# Patient Record
Sex: Female | Born: 1985 | Race: White | Hispanic: No | Marital: Married | Smoking: Never smoker
Health system: Southern US, Community
[De-identification: ages and names within clinical notes are randomized; demographics above are authoritative.]

## PROBLEM LIST (undated history)

## (undated) DIAGNOSIS — T8859XA Other complications of anesthesia, initial encounter: Secondary | ICD-10-CM

## (undated) DIAGNOSIS — F329 Major depressive disorder, single episode, unspecified: Secondary | ICD-10-CM

## (undated) DIAGNOSIS — B977 Papillomavirus as the cause of diseases classified elsewhere: Secondary | ICD-10-CM

## (undated) DIAGNOSIS — M51369 Other intervertebral disc degeneration, lumbar region without mention of lumbar back pain or lower extremity pain: Secondary | ICD-10-CM

## (undated) DIAGNOSIS — F32A Depression, unspecified: Secondary | ICD-10-CM

## (undated) DIAGNOSIS — K219 Gastro-esophageal reflux disease without esophagitis: Secondary | ICD-10-CM

## (undated) DIAGNOSIS — M48061 Spinal stenosis, lumbar region without neurogenic claudication: Secondary | ICD-10-CM

## (undated) DIAGNOSIS — D649 Anemia, unspecified: Secondary | ICD-10-CM

## (undated) DIAGNOSIS — F988 Other specified behavioral and emotional disorders with onset usually occurring in childhood and adolescence: Secondary | ICD-10-CM

## (undated) DIAGNOSIS — C539 Malignant neoplasm of cervix uteri, unspecified: Secondary | ICD-10-CM

## (undated) DIAGNOSIS — M779 Enthesopathy, unspecified: Secondary | ICD-10-CM

## (undated) DIAGNOSIS — A6920 Lyme disease, unspecified: Secondary | ICD-10-CM

## (undated) DIAGNOSIS — N809 Endometriosis, unspecified: Secondary | ICD-10-CM

## (undated) DIAGNOSIS — H04123 Dry eye syndrome of bilateral lacrimal glands: Secondary | ICD-10-CM

## (undated) DIAGNOSIS — R7303 Prediabetes: Secondary | ICD-10-CM

## (undated) DIAGNOSIS — R630 Anorexia: Secondary | ICD-10-CM

## (undated) DIAGNOSIS — K3184 Gastroparesis: Secondary | ICD-10-CM

## (undated) DIAGNOSIS — E039 Hypothyroidism, unspecified: Secondary | ICD-10-CM

## (undated) DIAGNOSIS — Z9889 Other specified postprocedural states: Secondary | ICD-10-CM

## (undated) DIAGNOSIS — F39 Unspecified mood [affective] disorder: Secondary | ICD-10-CM

## (undated) DIAGNOSIS — E079 Disorder of thyroid, unspecified: Secondary | ICD-10-CM

## (undated) DIAGNOSIS — L309 Dermatitis, unspecified: Secondary | ICD-10-CM

## (undated) DIAGNOSIS — L300 Nummular dermatitis: Secondary | ICD-10-CM

## (undated) DIAGNOSIS — F419 Anxiety disorder, unspecified: Secondary | ICD-10-CM

## (undated) DIAGNOSIS — G894 Chronic pain syndrome: Secondary | ICD-10-CM

## (undated) DIAGNOSIS — F4310 Post-traumatic stress disorder, unspecified: Secondary | ICD-10-CM

## (undated) DIAGNOSIS — R112 Nausea with vomiting, unspecified: Secondary | ICD-10-CM

## (undated) HISTORY — DX: Anorexia: R63.0

## (undated) HISTORY — PX: GALLBLADDER SURGERY: SHX652

## (undated) HISTORY — DX: Depression, unspecified: F32.A

## (undated) HISTORY — DX: Anxiety disorder, unspecified: F41.9

## (undated) HISTORY — PX: ABDOMINAL HYSTERECTOMY: SHX81

## (undated) HISTORY — PX: OTHER SURGICAL HISTORY: SHX169

## (undated) HISTORY — PX: DIAGNOSTIC LAPAROSCOPY: SUR761

## (undated) HISTORY — PX: EYE SURGERY: SHX253

## (undated) HISTORY — PX: BACK SURGERY: SHX140

## (undated) HISTORY — DX: Major depressive disorder, single episode, unspecified: F32.9

## (undated) HISTORY — DX: Other specified behavioral and emotional disorders with onset usually occurring in childhood and adolescence: F98.8

## (undated) HISTORY — DX: Disorder of thyroid, unspecified: E07.9

## (undated) HISTORY — PX: CHOLECYSTECTOMY: SHX55

## (undated) HISTORY — DX: Papillomavirus as the cause of diseases classified elsewhere: B97.7

## (undated) HISTORY — DX: Dermatitis, unspecified: L30.9

## (undated) HISTORY — PX: HERNIA REPAIR: SHX51

---

## 2005-04-18 ENCOUNTER — Emergency Department (HOSPITAL_COMMUNITY): Admission: EM | Admit: 2005-04-18 | Discharge: 2005-04-18 | Payer: Self-pay | Admitting: Emergency Medicine

## 2005-04-19 ENCOUNTER — Emergency Department (HOSPITAL_COMMUNITY): Admission: EM | Admit: 2005-04-19 | Discharge: 2005-04-20 | Payer: Self-pay | Admitting: *Deleted

## 2005-04-20 ENCOUNTER — Inpatient Hospital Stay (HOSPITAL_COMMUNITY): Admission: EM | Admit: 2005-04-20 | Discharge: 2005-04-22 | Payer: Self-pay | Admitting: Psychiatry

## 2005-04-20 ENCOUNTER — Ambulatory Visit: Payer: Self-pay | Admitting: Psychiatry

## 2005-08-04 ENCOUNTER — Emergency Department (HOSPITAL_COMMUNITY): Admission: EM | Admit: 2005-08-04 | Discharge: 2005-08-05 | Payer: Self-pay | Admitting: Emergency Medicine

## 2005-09-08 ENCOUNTER — Emergency Department (HOSPITAL_COMMUNITY): Admission: EM | Admit: 2005-09-08 | Discharge: 2005-09-08 | Payer: Self-pay | Admitting: Emergency Medicine

## 2006-01-11 ENCOUNTER — Emergency Department (HOSPITAL_COMMUNITY): Admission: EM | Admit: 2006-01-11 | Discharge: 2006-01-12 | Payer: Self-pay | Admitting: Emergency Medicine

## 2007-01-11 ENCOUNTER — Emergency Department (HOSPITAL_COMMUNITY): Admission: EM | Admit: 2007-01-11 | Discharge: 2007-01-11 | Payer: Self-pay | Admitting: Emergency Medicine

## 2007-02-06 ENCOUNTER — Emergency Department (HOSPITAL_COMMUNITY): Admission: EM | Admit: 2007-02-06 | Discharge: 2007-02-06 | Payer: Self-pay | Admitting: Emergency Medicine

## 2007-02-09 ENCOUNTER — Inpatient Hospital Stay (HOSPITAL_COMMUNITY): Admission: AD | Admit: 2007-02-09 | Discharge: 2007-02-10 | Payer: Self-pay | Admitting: Obstetrics & Gynecology

## 2007-02-27 ENCOUNTER — Emergency Department (HOSPITAL_COMMUNITY): Admission: EM | Admit: 2007-02-27 | Discharge: 2007-02-28 | Payer: Self-pay | Admitting: Emergency Medicine

## 2010-04-30 ENCOUNTER — Emergency Department: Payer: Self-pay | Admitting: Emergency Medicine

## 2010-05-31 ENCOUNTER — Emergency Department: Payer: Self-pay | Admitting: Emergency Medicine

## 2010-06-14 ENCOUNTER — Encounter
Admission: RE | Admit: 2010-06-14 | Discharge: 2010-06-14 | Payer: Self-pay | Source: Home / Self Care | Attending: Family Medicine | Admitting: Family Medicine
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
## 2010-12-26 IMAGING — CT CT PARANASAL SINUSES LIMITED
1 series · 10 of 12 positions shown, 13 images · non-contrast
Comparison: None.

CLINICAL DATA: [DATE].  Sinus congestion.  Pressure in the frontal
and maxillary areas.

CT PARANASAL SINUS LIMITED WITHOUT CONTRAST
TECHNIQUE: Multidetector CT images of the paranasal sinuses were
obtained in a single plane without contrast.

[Series 3: coronal soft · axial · 0.33mm/px · z∈[+124,+214]mm · 10 of 12 slices shown, 13 images]
[im 2/12  brain]
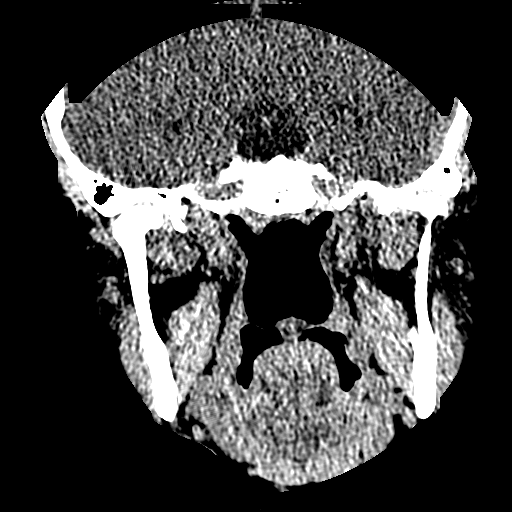
[im 2/12  bone]
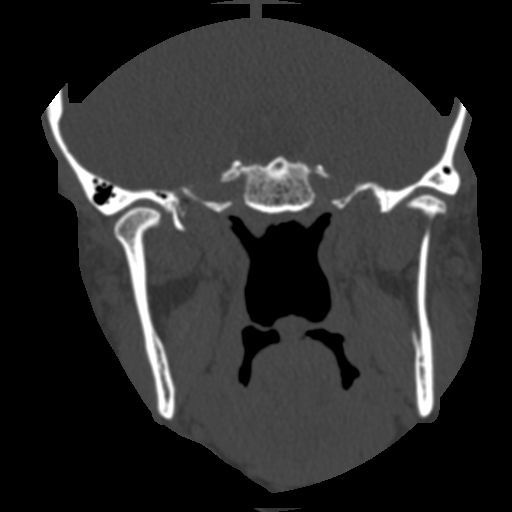
[im 3/12  bone]
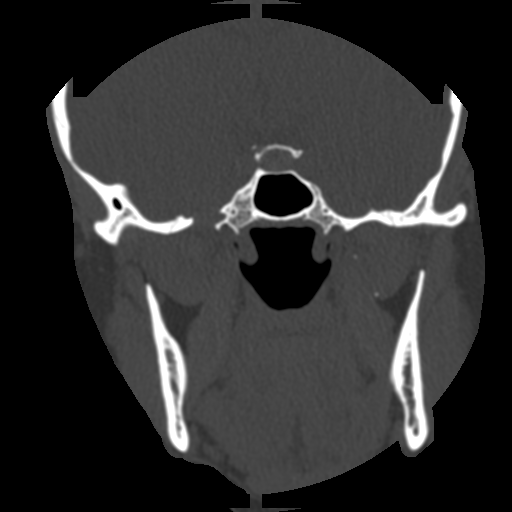
[im 4/12  bone]
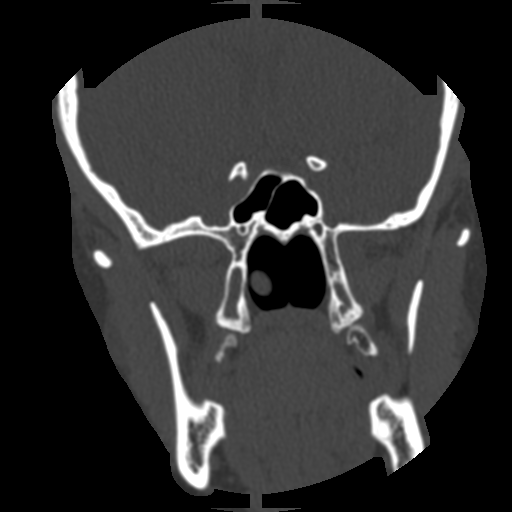
[im 5/12  bone]
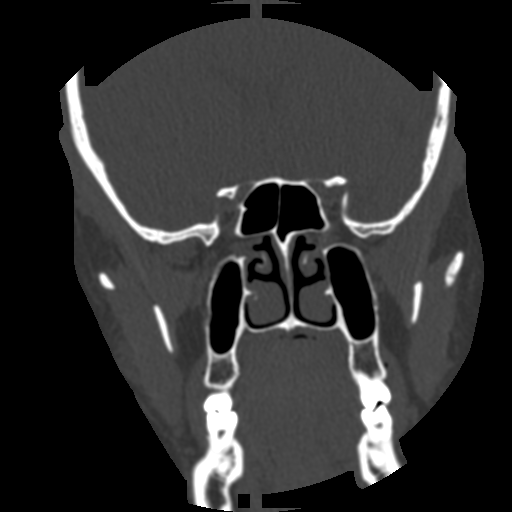
[im 6/12  brain]
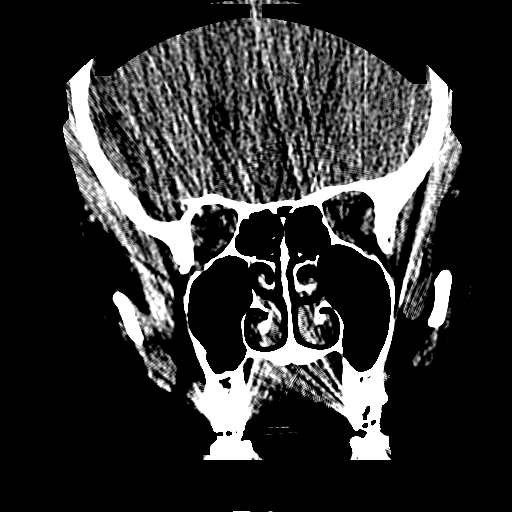
[im 6/12  bone]
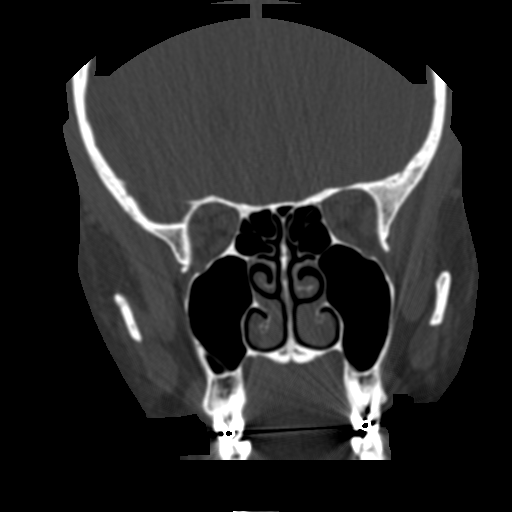
[im 7/12  bone]
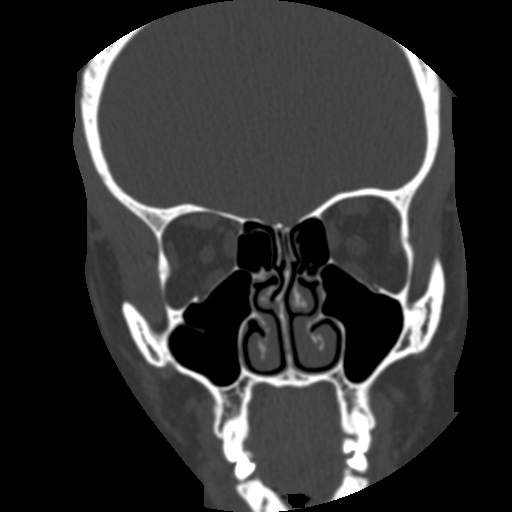
[im 8/12  bone]
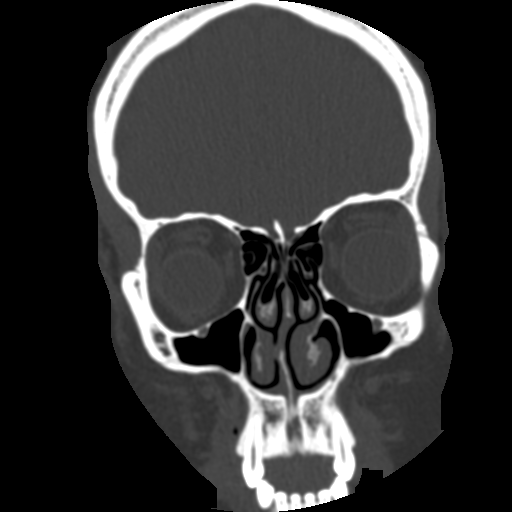
[im 9/12  bone]
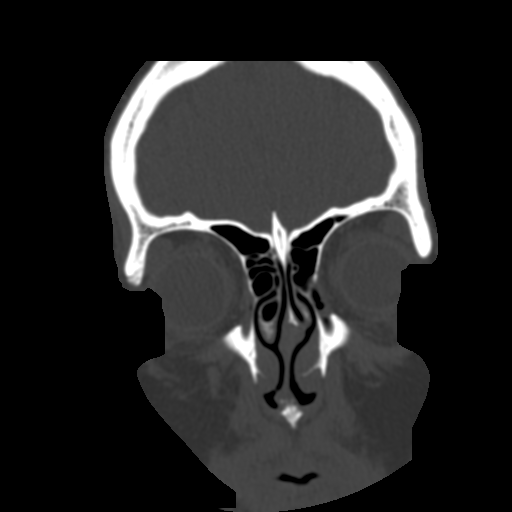
[im 10/12  brain]
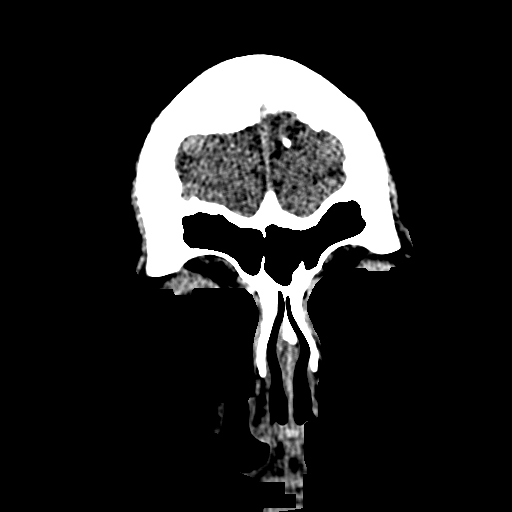
[im 10/12  bone]
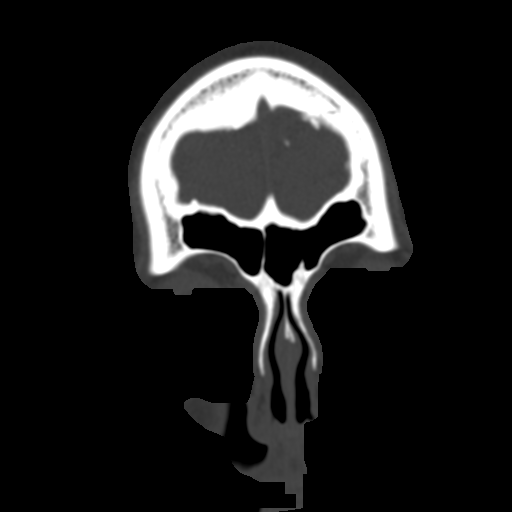
[im 11/12  bone]
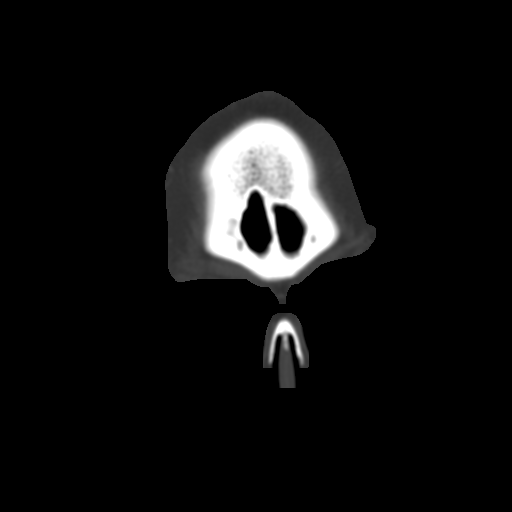

[10 of 12 positions shown; findings below may reference images not displayed]

FINDINGS: Screening examination of the paranasal sinuses
demonstrates no focal mucosal disease or fluid level.  Limited
imaging of the brain is unremarkable.  Limited imaging of the
orbits is within normal limits.
IMPRESSION: Negative screening CT of the paranasal sinuses.

## 2011-04-08 LAB — URINALYSIS, ROUTINE W REFLEX MICROSCOPIC
Bilirubin Urine: NEGATIVE
Bilirubin Urine: NEGATIVE
Glucose, UA: NEGATIVE
Glucose, UA: NEGATIVE
Hgb urine dipstick: NEGATIVE
Ketones, ur: NEGATIVE
Ketones, ur: NEGATIVE
Leukocytes, UA: NEGATIVE
Nitrite: NEGATIVE
Nitrite: NEGATIVE
Protein, ur: NEGATIVE
Protein, ur: NEGATIVE
Specific Gravity, Urine: 1.011
Specific Gravity, Urine: <1.005 — ABNORMAL LOW
Urobilinogen, UA: 0.2
Urobilinogen, UA: 0.2
pH: 5.5
pH: 6

## 2011-04-08 LAB — DIFFERENTIAL
Basophils Absolute: 0
Basophils Relative: 0
Eosinophils Absolute: 0.1
Eosinophils Relative: 1
Lymphocytes Relative: 16
Lymphs Abs: 1.4
Monocytes Absolute: 0.7
Monocytes Relative: 8
Neutro Abs: 6.5
Neutrophils Relative %: 75

## 2011-04-08 LAB — POCT I-STAT CREATININE
Creatinine, Ser: 1
Operator id: 272551

## 2011-04-08 LAB — I-STAT 8, (EC8 V) (CONVERTED LAB)
Acid-base deficit: 1
BUN: 10
Bicarbonate: 25.2 — ABNORMAL HIGH
Chloride: 102
Glucose, Bld: 92
HCT: 38
Hemoglobin: 12.9
Operator id: 272551
Potassium: 4.2
Sodium: 135
TCO2: 27
pCO2, Ven: 44.9 — ABNORMAL LOW
pH, Ven: 7.356 — ABNORMAL HIGH

## 2011-04-08 LAB — CBC
HCT: 36.3
Hemoglobin: 12.6
MCHC: 34.8
MCV: 89.6
Platelets: 262
RBC: 4.05
RDW: 11.9
WBC: 8.6

## 2011-04-08 LAB — URINE MICROSCOPIC-ADD ON

## 2011-04-08 LAB — POCT PREGNANCY, URINE
Operator id: 272551
Operator id: 118971
Preg Test, Ur: NEGATIVE
Preg Test, Ur: NEGATIVE

## 2011-04-11 LAB — I-STAT 8, (EC8 V) (CONVERTED LAB)
Acid-Base Excess: 1
Acid-Base Excess: 2
BUN: 9
BUN: 8
Bicarbonate: 28.3 — ABNORMAL HIGH
Bicarbonate: 28.2 — ABNORMAL HIGH
Chloride: 103
Chloride: 103
Glucose, Bld: 57 — ABNORMAL LOW
Glucose, Bld: 91
HCT: 39
HCT: 37
Hemoglobin: 13.3
Hemoglobin: 12.6
Operator id: 235561
Operator id: 239701
Potassium: 4.3
Potassium: 4.4
Sodium: 138
Sodium: 138
TCO2: 30
TCO2: 30
pCO2, Ven: 53.7 — ABNORMAL HIGH
pCO2, Ven: 47.2
pH, Ven: 7.33 — ABNORMAL HIGH
pH, Ven: 7.384 — ABNORMAL HIGH

## 2011-04-11 LAB — URINE CULTURE: Colony Count: 75000

## 2011-04-11 LAB — POCT URINALYSIS DIP (DEVICE)
Bilirubin Urine: NEGATIVE
Glucose, UA: NEGATIVE
Ketones, ur: NEGATIVE
Nitrite: NEGATIVE
Operator id: 235561
Protein, ur: NEGATIVE
Specific Gravity, Urine: 1.015
Urobilinogen, UA: 0.2
pH: 7

## 2011-04-11 LAB — GC/CHLAMYDIA PROBE AMP, GENITAL
Chlamydia, DNA Probe: NEGATIVE
GC Probe Amp, Genital: NEGATIVE

## 2011-04-11 LAB — POCT I-STAT CREATININE
Creatinine, Ser: 0.8
Creatinine, Ser: 0.9
Operator id: 235561
Operator id: 239701

## 2011-04-11 LAB — POCT PREGNANCY, URINE
Operator id: 235561
Operator id: 247071
Preg Test, Ur: NEGATIVE
Preg Test, Ur: NEGATIVE

## 2011-04-11 LAB — WET PREP, GENITAL
Trich, Wet Prep: NONE SEEN
Yeast Wet Prep HPF POC: NONE SEEN

## 2011-04-27 ENCOUNTER — Emergency Department: Payer: Self-pay | Admitting: Emergency Medicine

## 2011-08-31 ENCOUNTER — Emergency Department: Payer: Self-pay | Admitting: Unknown Physician Specialty

## 2011-08-31 LAB — COMPREHENSIVE METABOLIC PANEL
Albumin: 4 g/dL (ref 3.4–5.0)
Alkaline Phosphatase: 52 U/L (ref 50–136)
Anion Gap: 9 (ref 7–16)
BUN: 10 mg/dL (ref 7–18)
Bilirubin,Total: 0.3 mg/dL (ref 0.2–1.0)
Calcium, Total: 9.1 mg/dL (ref 8.5–10.1)
Chloride: 102 mmol/L (ref 98–107)
Co2: 27 mmol/L (ref 21–32)
Creatinine: 0.88 mg/dL (ref 0.60–1.30)
EGFR (African American): >60
EGFR (Non-African Amer.): >60
Glucose: 81 mg/dL (ref 65–99)
Osmolality: 274 (ref 275–301)
Potassium: 4.1 mmol/L (ref 3.5–5.1)
SGOT(AST): 25 U/L (ref 15–37)
SGPT (ALT): 24 U/L
Sodium: 138 mmol/L (ref 136–145)
Total Protein: 7.8 g/dL (ref 6.4–8.2)

## 2011-08-31 LAB — DRUG SCREEN, URINE
Amphetamines, Ur Screen: NEGATIVE (ref ?–1000)
Barbiturates, Ur Screen: NEGATIVE (ref ?–200)
Benzodiazepine, Ur Scrn: NEGATIVE (ref ?–200)
Cannabinoid 50 Ng, Ur ~~LOC~~: NEGATIVE (ref ?–50)
Cocaine Metabolite,Ur ~~LOC~~: NEGATIVE (ref ?–300)
MDMA (Ecstasy)Ur Screen: POSITIVE (ref ?–500)
Methadone, Ur Screen: NEGATIVE (ref ?–300)
Opiate, Ur Screen: NEGATIVE (ref ?–300)
Phencyclidine (PCP) Ur S: NEGATIVE (ref ?–25)
Tricyclic, Ur Screen: NEGATIVE (ref ?–1000)

## 2011-08-31 LAB — CBC
HCT: 37.7 % (ref 35.0–47.0)
HGB: 12.7 g/dL (ref 12.0–16.0)
MCH: 30.3 pg (ref 26.0–34.0)
MCHC: 33.7 g/dL (ref 32.0–36.0)
MCV: 90 fL (ref 80–100)
Platelet: 248 10*3/uL (ref 150–440)
RBC: 4.2 10*6/uL (ref 3.80–5.20)
RDW: 12.6 % (ref 11.5–14.5)
WBC: 6 10*3/uL (ref 3.6–11.0)

## 2011-08-31 LAB — URINALYSIS, COMPLETE
Bilirubin,UR: NEGATIVE
Blood: NEGATIVE
Glucose,UR: NEGATIVE mg/dL (ref 0–75)
Ketone: NEGATIVE
Leukocyte Esterase: NEGATIVE
Nitrite: NEGATIVE
Ph: 6 (ref 4.5–8.0)
Protein: NEGATIVE
RBC,UR: 6 /HPF (ref 0–5)
Specific Gravity: 1.019 (ref 1.003–1.030)
Squamous Epithelial: <1
WBC UR: 3 /HPF (ref 0–5)

## 2011-08-31 LAB — MONONUCLEOSIS SCREEN: Mono Test: NEGATIVE

## 2011-08-31 LAB — TROPONIN I: Troponin-I: <0.02 ng/mL

## 2011-08-31 LAB — PREGNANCY, URINE: Pregnancy Test, Urine: NEGATIVE m[IU]/mL

## 2012-02-07 ENCOUNTER — Emergency Department (HOSPITAL_COMMUNITY)
Admission: EM | Admit: 2012-02-07 | Discharge: 2012-02-07 | Disposition: A | Discharge status: Home / Self Care | Payer: Self-pay | Attending: Emergency Medicine | Admitting: Emergency Medicine

## 2012-02-07 ENCOUNTER — Encounter (HOSPITAL_COMMUNITY): Payer: Self-pay | Admitting: Emergency Medicine

## 2012-02-07 DIAGNOSIS — R102 Pelvic and perineal pain: Secondary | ICD-10-CM

## 2012-02-07 DIAGNOSIS — Z880 Allergy status to penicillin: Secondary | ICD-10-CM | POA: Insufficient documentation

## 2012-02-07 DIAGNOSIS — N949 Unspecified condition associated with female genital organs and menstrual cycle: Secondary | ICD-10-CM | POA: Insufficient documentation

## 2012-02-07 LAB — CBC WITH DIFFERENTIAL/PLATELET
Basophils Absolute: 0 10*3/uL (ref 0.0–0.1)
Basophils Relative: 0 % (ref 0–1)
Eosinophils Absolute: 0 10*3/uL (ref 0.0–0.7)
Eosinophils Relative: 0 % (ref 0–5)
HCT: 36.6 % (ref 36.0–46.0)
Hemoglobin: 12.7 g/dL (ref 12.0–15.0)
Lymphocytes Relative: 29 % (ref 12–46)
Lymphs Abs: 2.4 10*3/uL (ref 0.7–4.0)
MCH: 30.6 pg (ref 26.0–34.0)
MCHC: 34.7 g/dL (ref 30.0–36.0)
MCV: 88.2 fL (ref 78.0–100.0)
Monocytes Absolute: 0.4 10*3/uL (ref 0.1–1.0)
Monocytes Relative: 5 % (ref 3–12)
Neutro Abs: 5.4 10*3/uL (ref 1.7–7.7)
Neutrophils Relative %: 66 % (ref 43–77)
Platelets: 251 10*3/uL (ref 150–400)
RBC: 4.15 MIL/uL (ref 3.87–5.11)
RDW: 12.3 % (ref 11.5–15.5)
WBC: 8.2 10*3/uL (ref 4.0–10.5)

## 2012-02-07 LAB — URINALYSIS, ROUTINE W REFLEX MICROSCOPIC
Bilirubin Urine: NEGATIVE
Glucose, UA: NEGATIVE mg/dL
Hgb urine dipstick: NEGATIVE
Ketones, ur: NEGATIVE mg/dL
Leukocytes, UA: NEGATIVE
Nitrite: NEGATIVE
Protein, ur: NEGATIVE mg/dL
Specific Gravity, Urine: 1.01 (ref 1.005–1.030)
Urobilinogen, UA: 0.2 mg/dL (ref 0.0–1.0)
pH: 6 (ref 5.0–8.0)

## 2012-02-07 LAB — POCT I-STAT, CHEM 8
BUN: 10 mg/dL (ref 6–23)
Calcium, Ion: 1.21 mmol/L (ref 1.12–1.23)
Chloride: 101 mEq/L (ref 96–112)
Creatinine, Ser: 0.9 mg/dL (ref 0.50–1.10)
Glucose, Bld: 115 mg/dL — ABNORMAL HIGH (ref 70–99)
HCT: 47 % — ABNORMAL HIGH (ref 36.0–46.0)
Hemoglobin: 16 g/dL — ABNORMAL HIGH (ref 12.0–15.0)
Potassium: 3.9 mEq/L (ref 3.5–5.1)
Sodium: 140 mEq/L (ref 135–145)
TCO2: 27 mmol/L (ref 0–100)

## 2012-02-07 LAB — POCT PREGNANCY, URINE: Preg Test, Ur: NEGATIVE

## 2012-02-07 MED ORDER — HYDROCODONE-ACETAMINOPHEN 5-325 MG PO TABS: 2.0000 | ORAL_TABLET | Freq: Four times a day (QID) | ORAL | Status: AC | PRN

## 2012-02-07 NOTE — ED Provider Notes (Signed)
History     CSN: [id number]  Arrival date & time 02/07/12  0120   First MD Initiated Contact with Patient 02/07/12 0455      Chief Complaint  Patient presents with  . Abdominal Pain    (Consider location/radiation/quality/duration/timing/severity/associated sxs/prior treatment) HPI This 26 year old female has had chronic pelvic pain for several months at a time in the past. She was on Lupron therapy and her pain resolved for several months. She has now been off Lupron therapy and is on birth control. She has been pain-free for several months until the last couple weeks. Over the last couple of weeks she has seen her gynecologist who performed a pelvic examination. Her gynecologist felt the patient may have an ovarian cyst but did not perform an ultrasound. Patient has had at least 2 weeks of constant pelvic pain 24 hours a day worse with movement and better she stays still. There is no improvement with Tylenol or ibuprofen. She's had no fever cough chest pain or shortness of breath. She's had no nausea vomiting diarrhea. She is no vaginal bleeding or discharge. She is no back pain. She is no leg pain weakness or numbness. Her pain is moderately severe and gradual onset. Her pain is not colicky or sudden onset. History reviewed. No pertinent past medical history.  Past Surgical History  Procedure Date  . Hernia repair   . Eye surgery     for lazy eye    No family history on file.  History  Substance Use Topics  . Smoking status: Never Smoker   . Smokeless tobacco: Not on file  . Alcohol Use: Yes     occasion    OB History    Grav Para Term Preterm Abortions TAB SAB Ect Mult Living                  Review of Systems 10 Systems reviewed and are negative for acute change except as noted in the HPI. Allergies  Ciprofloxacin and Penicillins  Home Medications   Current Outpatient Rx  Name Route Sig Dispense Refill  . HYDROCODONE-ACETAMINOPHEN 5-325 MG PO TABS Oral Take 2  tablets by mouth every 6 (six) hours as needed for pain. 15 tablet 0    BP 104/64  Pulse 73  Temp 97.9 F (36.6 C) (Oral)  Resp 18  SpO2 96%  Physical Exam  Nursing note and vitals reviewed. Constitutional:       Awake, alert, nontoxic appearance.  HENT:  Head: Atraumatic.  Eyes: Right eye exhibits no discharge. Left eye exhibits no discharge.  Neck: Neck supple.  Cardiovascular: Normal rate and regular rhythm.   No murmur heard. Pulmonary/Chest: Effort normal and breath sounds normal. No respiratory distress. She has no wheezes. She has no rales. She exhibits no tenderness.  Abdominal: Soft. Bowel sounds are normal. She exhibits no distension and no mass. There is tenderness. There is no rebound and no guarding.       Minimally tender across the lower abdomen to the right lower quadrant, suprapubic, and left lower quadrant regions and nontender the upper abdomen.  Genitourinary:       Chaperone was present for bimanual examination with minimal cervical motion tenderness minimal bilateral Tenderness no palpable adnexal masses. No blood or discharge on the examination glove.  Musculoskeletal: She exhibits no tenderness.       Baseline ROM, no obvious new focal weakness.  Neurological:       Mental status and motor strength appears baseline for patient  and situation.  Skin: No rash noted.  Psychiatric: She has a normal mood and affect.    ED Course  Procedures (including critical care time)  Labs Reviewed  POCT I-STAT, CHEM 8 - Abnormal; Notable for the following:    Glucose, Bld 115 (*)     Hemoglobin 16.0 (*)     HCT 47.0 (*)     All other components within normal limits  URINALYSIS, ROUTINE W REFLEX MICROSCOPIC  CBC WITH DIFFERENTIAL  POCT PREGNANCY, URINE   No results found.   1. Pelvic pain in female       MDM  Pt stable in ED with no significant deterioration in condition.Patient / Family / Caregiver informed of clinical course, understand medical  decision-making process, and agree with plan.  Doubt ovarian torsion, SBI, peritonitis.       Hurman Horn, MD 02/07/12 [phone]

## 2012-02-07 NOTE — ED Notes (Signed)
Pt. Diagnosed with ovarian cyst by GYN prior to coming to ED. States she has an apt to get recheck. C/o lower abdominal pain.

## 2012-02-07 NOTE — ED Notes (Signed)
EDP in for bimanual exam of pelvic area; this tech as a chaperone.

## 2012-02-07 NOTE — ED Notes (Addendum)
Pt reports a couple a weeks ago, went to OBGYN, and was told she felt a cyst on R ovary and was supposed to come back in 6 weeks; pt now having lower mid abd pain; denies dysuria; reports nausea

## 2012-02-07 NOTE — ED Notes (Signed)
Prescription given with discharge instructions.  

## 2012-08-04 ENCOUNTER — Emergency Department: Payer: Self-pay | Admitting: Emergency Medicine

## 2013-03-13 ENCOUNTER — Ambulatory Visit (INDEPENDENT_AMBULATORY_CARE_PROVIDER_SITE_OTHER): Payer: Self-pay | Admitting: Endocrinology

## 2013-03-13 ENCOUNTER — Encounter: Payer: Self-pay | Admitting: Endocrinology

## 2013-03-13 VITALS — BP 118/60 | HR 104 | Temp 98.9°F | Resp 12 | Ht 68.5 in | Wt 170.0 lb

## 2013-03-13 DIAGNOSIS — R5381 Other malaise: Secondary | ICD-10-CM

## 2013-03-13 DIAGNOSIS — Z309 Encounter for contraceptive management, unspecified: Secondary | ICD-10-CM

## 2013-03-13 NOTE — Progress Notes (Signed)
Subjective:    Patient ID: Peggy Ruiz, female    DOB: [birth year], 27 y.o.   MRN: [id number]  HPI Pt states few years of moderate pain throughout the body, and associated fatigue.  adderall helps temporarily only.  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  She says she was rx'ed nuvigil, but this caused insomnia.  In 2014, she had syncopal episode.  She says she was found to have transient HTN and tachycardia.   Past Medical History  Diagnosis Date  . Anxiety   . Depression   . Anorexia     at 15  . HPV (human papilloma virus) infection   . Attention deficit disorder (ADD)     Past Surgical History  Procedure Laterality Date  . Hernia repair    . Eye surgery      for lazy eye    History   Social History  . Marital Status: Single    Spouse Name: N/A    Number of Children: N/A  . Years of Education: N/A   Occupational History  . Not on file.   Social History Main Topics  . Smoking status: Never Smoker   . Smokeless tobacco: Not on file  . Alcohol Use: Yes     Comment: occasion  . Drug Use: No  . Sexual Activity: No   Other Topics Concern  . Not on file   Social History Narrative   Regular exercise: none   Caffeine use: daily    No current outpatient prescriptions on file prior to visit.   No current facility-administered medications on file prior to visit.    Allergies  Allergen Reactions  . Ciprofloxacin Other (See Comments)    c-diff  . Penicillins Hives   Family History  Problem Relation Age of Onset  . Fibromyalgia Mother   . Anxiety disorder Mother   . Depression Mother   . Depression Father   . Anxiety disorder Father   hypothyroidism: both parents. BP 118/60  Pulse 104  Temp(Src) 98.9 F (37.2 C) (Oral)  Resp 12  Ht 5' 8.5" (1.74 m)  Wt 170 lb (77.111 kg)  BMI 25.47 kg/m2  SpO2 98%  Review of Systems denies depression, hair loss, sob, difficulty with concentration, constipation, numbness,  blurry vision, excessive diaphoresis, and rhinorrhea.   She has acral numbness, anxiety, muscle cramps, weight gain, easy bruising, and headache.      Objective:   Physical Exam VS: see vs page GEN: no distress HEAD: head: no deformity eyes: no periorbital swelling, no proptosis external nose and ears are normal mouth: no lesion seen NECK: supple, thyroid is not enlarged CHEST WALL: no deformity LUNGS:  Clear to auscultation. CV: reg rate and rhythm, no murmur ABD: abdomen is soft, nontender.  no hepatosplenomegaly.  not distended.  no hernia MUSCULOSKELETAL: muscle bulk and strength are grossly normal.  no obvious joint swelling.  gait is normal and steady EXTEMITIES: no deformity.  no ulcer on the feet.  feet are of normal color and temp.  no edema PULSES: dorsalis pedis intact bilat.  no carotid bruit NEURO:  cn 2-12 grossly intact.   readily moves all 4's.  sensation is intact to touch on the feet SKIN:  Normal texture and temperature.  No rash or suspicious lesion is visible.   NODES:  None palpable at the neck PSYCH: alert, oriented x3.  Does not appear anxious nor depressed.   Pt says her recent TFT were normal.  Assessment & Plan:  Fatigue, uncertain etiology Cramps, uncertain etiology

## 2013-03-13 NOTE — Patient Instructions (Addendum)
Please schedule a nurse visit, to do the "acth" test. If this is normal, you could see a sleep specialist again.

## 2013-03-14 DIAGNOSIS — Z309 Encounter for contraceptive management, unspecified: Secondary | ICD-10-CM | POA: Insufficient documentation

## 2013-03-14 DIAGNOSIS — R5381 Other malaise: Secondary | ICD-10-CM | POA: Insufficient documentation

## 2013-04-29 ENCOUNTER — Telehealth: Payer: Self-pay | Admitting: *Deleted

## 2013-04-29 DIAGNOSIS — R5381 Other malaise: Secondary | ICD-10-CM

## 2013-04-29 NOTE — Telephone Encounter (Signed)
Pt called to schedule ACTH lab and injection. Pt is scheduled for Nov. 12th at 3:45. Please put in these labs. Thank you.

## 2013-04-29 NOTE — Telephone Encounter (Signed)
done

## 2013-05-08 ENCOUNTER — Other Ambulatory Visit (INDEPENDENT_AMBULATORY_CARE_PROVIDER_SITE_OTHER): Payer: Self-pay

## 2013-05-08 ENCOUNTER — Ambulatory Visit: Payer: Self-pay

## 2013-05-08 ENCOUNTER — Other Ambulatory Visit: Payer: Self-pay | Admitting: *Deleted

## 2013-05-08 ENCOUNTER — Other Ambulatory Visit: Payer: Self-pay | Admitting: Endocrinology

## 2013-05-08 DIAGNOSIS — R5381 Other malaise: Secondary | ICD-10-CM

## 2013-05-08 MED ORDER — COSYNTROPIN 0.25 MG IJ SOLR
0.2500 mg | Freq: Once | INTRAMUSCULAR | Status: AC
  Administered 2013-05-08: 0.25 mg via INTRAMUSCULAR

## 2013-05-09 LAB — CORTISOL
Cortisol, Plasma: 6 ug/dL
Cortisol, Plasma: 23.2 ug/dL

## 2013-08-09 ENCOUNTER — Emergency Department (HOSPITAL_COMMUNITY)
Admission: EM | Admit: 2013-08-09 | Discharge: 2013-08-09 | Disposition: A | Discharge status: Home / Self Care | Payer: Self-pay | Attending: Emergency Medicine | Admitting: Emergency Medicine

## 2013-08-09 ENCOUNTER — Encounter (HOSPITAL_COMMUNITY): Payer: Self-pay | Admitting: Emergency Medicine

## 2013-08-09 DIAGNOSIS — Y929 Unspecified place or not applicable: Secondary | ICD-10-CM | POA: Insufficient documentation

## 2013-08-09 DIAGNOSIS — Z8619 Personal history of other infectious and parasitic diseases: Secondary | ICD-10-CM | POA: Insufficient documentation

## 2013-08-09 DIAGNOSIS — R000 Tachycardia, unspecified: Secondary | ICD-10-CM | POA: Insufficient documentation

## 2013-08-09 DIAGNOSIS — Z79899 Other long term (current) drug therapy: Secondary | ICD-10-CM | POA: Insufficient documentation

## 2013-08-09 DIAGNOSIS — F329 Major depressive disorder, single episode, unspecified: Secondary | ICD-10-CM | POA: Insufficient documentation

## 2013-08-09 DIAGNOSIS — Z880 Allergy status to penicillin: Secondary | ICD-10-CM | POA: Insufficient documentation

## 2013-08-09 DIAGNOSIS — IMO0002 Reserved for concepts with insufficient information to code with codable children: Secondary | ICD-10-CM | POA: Insufficient documentation

## 2013-08-09 DIAGNOSIS — F988 Other specified behavioral and emotional disorders with onset usually occurring in childhood and adolescence: Secondary | ICD-10-CM | POA: Insufficient documentation

## 2013-08-09 DIAGNOSIS — Y990 Civilian activity done for income or pay: Secondary | ICD-10-CM | POA: Insufficient documentation

## 2013-08-09 DIAGNOSIS — R208 Other disturbances of skin sensation: Secondary | ICD-10-CM

## 2013-08-09 DIAGNOSIS — R42 Dizziness and giddiness: Secondary | ICD-10-CM | POA: Insufficient documentation

## 2013-08-09 DIAGNOSIS — Y9389 Activity, other specified: Secondary | ICD-10-CM | POA: Insufficient documentation

## 2013-08-09 DIAGNOSIS — F3289 Other specified depressive episodes: Secondary | ICD-10-CM | POA: Insufficient documentation

## 2013-08-09 DIAGNOSIS — W868XXA Exposure to other electric current, initial encounter: Secondary | ICD-10-CM | POA: Insufficient documentation

## 2013-08-09 DIAGNOSIS — F411 Generalized anxiety disorder: Secondary | ICD-10-CM | POA: Insufficient documentation

## 2013-08-09 DIAGNOSIS — T754XXA Electrocution, initial encounter: Secondary | ICD-10-CM | POA: Insufficient documentation
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
## 2013-08-09 NOTE — Discharge Instructions (Signed)
If you were given medicines take as directed.  If you are on coumadin or contraceptives realize their levels and effectiveness is altered by many different medicines.  If you have any reaction (rash, tongues swelling, other) to the medicines stop taking and see a physician.   °Please follow up as directed and return to the ER or see a physician for new or worsening symptoms.  Thank you. ° ° °
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
## 2013-11-20 ENCOUNTER — Encounter (HOSPITAL_COMMUNITY): Payer: Self-pay | Admitting: Emergency Medicine

## 2013-11-20 ENCOUNTER — Emergency Department (HOSPITAL_COMMUNITY)
Admission: EM | Admit: 2013-11-20 | Discharge: 2013-11-20 | Disposition: A | Discharge status: Home / Self Care | Payer: Self-pay | Attending: Emergency Medicine | Admitting: Emergency Medicine

## 2013-11-20 DIAGNOSIS — F3289 Other specified depressive episodes: Secondary | ICD-10-CM | POA: Insufficient documentation

## 2013-11-20 DIAGNOSIS — F329 Major depressive disorder, single episode, unspecified: Secondary | ICD-10-CM | POA: Insufficient documentation

## 2013-11-20 DIAGNOSIS — M545 Low back pain, unspecified: Secondary | ICD-10-CM | POA: Insufficient documentation

## 2013-11-20 DIAGNOSIS — Z79899 Other long term (current) drug therapy: Secondary | ICD-10-CM | POA: Insufficient documentation

## 2013-11-20 DIAGNOSIS — F411 Generalized anxiety disorder: Secondary | ICD-10-CM | POA: Insufficient documentation

## 2013-11-20 DIAGNOSIS — IMO0002 Reserved for concepts with insufficient information to code with codable children: Secondary | ICD-10-CM | POA: Insufficient documentation

## 2013-11-20 DIAGNOSIS — M549 Dorsalgia, unspecified: Secondary | ICD-10-CM

## 2013-11-20 DIAGNOSIS — Z8619 Personal history of other infectious and parasitic diseases: Secondary | ICD-10-CM | POA: Insufficient documentation

## 2013-11-20 DIAGNOSIS — F988 Other specified behavioral and emotional disorders with onset usually occurring in childhood and adolescence: Secondary | ICD-10-CM | POA: Insufficient documentation

## 2013-11-20 DIAGNOSIS — M79609 Pain in unspecified limb: Secondary | ICD-10-CM | POA: Insufficient documentation

## 2013-11-20 DIAGNOSIS — Z880 Allergy status to penicillin: Secondary | ICD-10-CM | POA: Insufficient documentation

## 2013-11-20 MED ORDER — HYDROCODONE-ACETAMINOPHEN 5-325 MG PO TABS
1.0000 | ORAL_TABLET | Freq: Once | ORAL | Status: AC
  Administered 2013-11-20: 1 via ORAL
  Filled 2013-11-20: qty 1

## 2013-11-20 MED ORDER — PREDNISONE 20 MG PO TABS: 60.0000 mg | ORAL_TABLET | Freq: Every day | ORAL | Status: DC

## 2013-11-20 MED ORDER — CYCLOBENZAPRINE HCL 10 MG PO TABS
10.0000 mg | ORAL_TABLET | Freq: Once | ORAL | Status: AC
  Administered 2013-11-20: 10 mg via ORAL
  Filled 2013-11-20: qty 1

## 2013-11-20 MED ORDER — HYDROCODONE-ACETAMINOPHEN 5-325 MG PO TABS: 1.0000 | ORAL_TABLET | ORAL | Status: DC | PRN

## 2013-11-20 MED ORDER — CYCLOBENZAPRINE HCL 10 MG PO TABS: 10.0000 mg | ORAL_TABLET | Freq: Three times a day (TID) | ORAL | Status: DC

## 2013-11-20 MED ORDER — PREDNISONE 20 MG PO TABS
60.0000 mg | ORAL_TABLET | Freq: Once | ORAL | Status: AC
  Administered 2013-11-20: 60 mg via ORAL
  Filled 2013-11-20: qty 3

## 2013-11-20 NOTE — ED Provider Notes (Signed)
CSN: [id number]     Arrival date & time 11/20/13  1657 History   First MD Initiated Contact with Patient 11/20/13 1814  This chart was scribed for non-physician practitioner Elpidio AnisShari Teagen Bucio, PA-C working with Shon Batonourtney F Horton, MD by Valera CastleSteven Perry, ED scribe. This patient was seen in room WTR6/WTR6 and the patient's care was started at 6:20 PM.    Chief Complaint  Patient presents with  . Back Pain  . Leg Pain   (Consider location/radiation/quality/duration/timing/severity/associated sxs/prior Treatment) The history is provided by the patient. No language interpreter was used.   HPI Comments: Peggy Ruiz is a 28 y.o. female who presents to the Emergency Department complaining of constant back pain that radiates down her bilateral LE, onset 2:00PM today while grocery shopping. She denies falling, any obvious injury. She reports when she sat down in her car her pain changed from aching to a sharp, stabbing pain. She reports h/o similar pain where her muscles ached severely with associated tingling. She denies bladder incontinence since onset of her back pain today, abdominal pain, and any other associated symptoms. She reports allergies to Cipro and Penicillin.   PCP - REDMON,NOELLE, PA-C  Past Medical History  Diagnosis Date  . Anxiety   . Depression   . Anorexia     at 15  . HPV (human papilloma virus) infection   . Attention deficit disorder (ADD)    Past Surgical History  Procedure Laterality Date  . Hernia repair    . Eye surgery      for lazy eye   Family History  Problem Relation Age of Onset  . Fibromyalgia Mother   . Anxiety disorder Mother   . Depression Mother   . Depression Father   . Anxiety disorder Father    History  Substance Use Topics  . Smoking status: Never Smoker   . Smokeless tobacco: Not on file  . Alcohol Use: Yes     Comment: occasion   OB History   Grav Para Term Preterm Abortions TAB SAB Ect Mult Living                 Review of Systems   Constitutional: Negative for fever.  Gastrointestinal: Negative for abdominal pain.  Genitourinary: Negative.   Musculoskeletal: Positive for back pain and myalgias.  Skin: Negative for wound.      Allergies  Ciprofloxacin and Penicillins  Home Medications   Prior to Admission medications   Medication Sig Start Date End Date Taking? Authorizing Provider  amphetamine-dextroamphetamine (ADDERALL) 20 MG tablet Take 20 mg by mouth 3 (three) times daily.   Yes Historical Provider, MD  Cholecalciferol (VITAMIN D-3) 5000 UNITS TABS Take 1 tablet by mouth daily.   Yes Historical Provider, MD  clonazePAM (KLONOPIN) 1 MG tablet Take 2 mg by mouth at bedtime.    Yes Historical Provider, MD  fluticasone (FLONASE) 50 MCG/ACT nasal spray Place 2 sprays into the nose daily.   Yes Historical Provider, MD  Multiple Vitamin (MULTIVITAMIN) tablet Take 1 tablet by mouth daily.   Yes Historical Provider, MD  Norgestimate-Eth Estradiol (ORTHO-CYCLEN, 28, PO) Take 1 each by mouth daily.   Yes Historical Provider, MD  OLANZapine (ZYPREXA) 7.5 MG tablet Take 7.5 mg by mouth at bedtime.   Yes Historical Provider, MD  PARoxetine (PAXIL) 30 MG tablet Take 30 mg by mouth at bedtime.   Yes Historical Provider, MD   BP 110/70  Pulse 86  Temp(Src) 97.4 F (36.3 C) (Oral)  Resp 18  Ht 5\' 8"  (1.727 m)  Wt 165 lb (74.844 kg)  BMI 25.09 kg/m2  SpO2 100%  LMP 11/13/2013  Physical Exam  Nursing note and vitals reviewed. Constitutional: She is oriented to person, place, and time. She appears well-developed and well-nourished. No distress.  HENT:  Head: Normocephalic and atraumatic.  Eyes: EOM are normal.  Neck: Neck supple.  Cardiovascular: Normal rate.   Pulmonary/Chest: Effort normal. No respiratory distress.  Abdominal: Soft. There is no tenderness.  Musculoskeletal: Normal range of motion. She exhibits tenderness. She exhibits no edema.  Back has midline lumbar tenderness without swelling. Full ROM.    Neurological: She is alert and oriented to person, place, and time. No cranial nerve deficit. She exhibits normal muscle tone. Coordination normal.  Normal reflexes. Essentially negative SLR bilaterally.  Skin: Skin is warm and dry.  Psychiatric: She has a normal mood and affect. Her behavior is normal.    ED Course  Procedures (including critical care time)  DIAGNOSTIC STUDIES: Oxygen Saturation is 100% on room air, normal by my interpretation.    COORDINATION OF CARE: 6:25 PM-Discussed treatment plan with pt at bedside and pt agreed to plan.   Labs Review Labs Reviewed - No data to display  Imaging Review No results found.   EKG Interpretation None     Medications - No data to display MDM   Final diagnoses:  None    1. Back pain  Onset today of low back pain without injury. No neurologic red flags. Will treat with conservative treatment and encourage PCP follow up for recheck.   I personally performed the services described in this documentation, which was scribed in my presence. The recorded information has been reviewed and is accurate.     Arnoldo Hooker, PA-C 11/20/13 1839

## 2013-11-20 NOTE — ED Notes (Signed)
Pt w/ hx of same pain.  Pt c/o low back pain that radiates bilateral legs.  States that her mom has fibromyalgia and she "thinks she might be getting it."

## 2013-11-20 NOTE — Discharge Instructions (Signed)
Back Pain, Adult Low back pain is very common. About 1 in 5 people have back pain.The cause of low back pain is rarely dangerous. The pain often gets better over time.About half of people with a sudden onset of back pain feel better in just 2 weeks. About 8 in 10 people feel better by 6 weeks.  CAUSES Some common causes of back pain include:  Strain of the muscles or ligaments supporting the spine.  Wear and tear (degeneration) of the spinal discs.  Arthritis.  Direct injury to the back. DIAGNOSIS Most of the time, the direct cause of low back pain is not known.However, back pain can be treated effectively even when the exact cause of the pain is unknown.Answering your caregiver's questions about your overall health and symptoms is one of the most accurate ways to make sure the cause of your pain is not dangerous. If your caregiver needs more information, he or she may order lab work or imaging tests (X-rays or MRIs).However, even if imaging tests show changes in your back, this usually does not require surgery. HOME CARE INSTRUCTIONS For many people, back pain returns.Since low back pain is rarely dangerous, it is often a condition that people can learn to manageon their own.   Remain active. It is stressful on the back to sit or stand in one place. Do not sit, drive, or stand in one place for more than 30 minutes at a time. Take short walks on level surfaces as soon as pain allows.Try to increase the length of time you walk each day.  Do not stay in bed.Resting more than 1 or 2 days can delay your recovery.  Do not avoid exercise or work.Your body is made to move.It is not dangerous to be active, even though your back may hurt.Your back will likely heal faster if you return to being active before your pain is gone.  Pay attention to your body when you bend and lift. Many people have less discomfortwhen lifting if they bend their knees, keep the load close to their bodies,and  avoid twisting. Often, the most comfortable positions are those that put less stress on your recovering back.  Find a comfortable position to sleep. Use a firm mattress and lie on your side with your knees slightly bent. If you lie on your back, put a pillow under your knees.  Only take over-the-counter or prescription medicines as directed by your caregiver. Over-the-counter medicines to reduce pain and inflammation are often the most helpful.Your caregiver may prescribe muscle relaxant drugs.These medicines help dull your pain so you can more quickly return to your normal activities and healthy exercise.  Put ice on the injured area.  Put ice in a plastic bag.  Place a towel between your skin and the bag.  Leave the ice on for 15-20 minutes, 03-04 times a day for the first 2 to 3 days. After that, ice and heat may be alternated to reduce pain and spasms.  Ask your caregiver about trying back exercises and gentle massage. This may be of some benefit.  Avoid feeling anxious or stressed.Stress increases muscle tension and can worsen back pain.It is important to recognize when you are anxious or stressed and learn ways to manage it.Exercise is a great option. SEEK MEDICAL CARE IF:  You have pain that is not relieved with rest or medicine.  You have pain that does not improve in 1 week.  You have new symptoms.  You are generally not feeling well. SEEK   IMMEDIATE MEDICAL CARE IF:   You have pain that radiates from your back into your legs.  You develop new bowel or bladder control problems.  You have unusual weakness or numbness in your arms or legs.  You develop nausea or vomiting.  You develop abdominal pain.  You feel faint. Document Released: 06/13/2005 Document Revised: 12/13/2011 Document Reviewed: 11/01/2010 [employer] Patient Information 2014 [employer].  

## 2013-11-21 NOTE — ED Provider Notes (Signed)
Medical screening examination/treatment/procedure(s) were performed by non-physician practitioner and as supervising physician I was immediately available for consultation/collaboration.   EKG Interpretation None        Courtney F Horton, MD 11/21/13 0025 

## 2013-11-25 ENCOUNTER — Other Ambulatory Visit: Payer: Self-pay | Admitting: Physician Assistant

## 2013-11-25 ENCOUNTER — Ambulatory Visit
Admission: RE | Admit: 2013-11-25 | Discharge: 2013-11-25 | Disposition: A | Discharge status: Home / Self Care | Payer: Self-pay | Source: Ambulatory Visit | Attending: Physician Assistant | Admitting: Physician Assistant

## 2013-11-25 ENCOUNTER — Ambulatory Visit: Payer: Self-pay

## 2013-11-25 DIAGNOSIS — M549 Dorsalgia, unspecified: Secondary | ICD-10-CM

## 2013-12-09 ENCOUNTER — Emergency Department (HOSPITAL_COMMUNITY)
Admission: EM | Admit: 2013-12-09 | Discharge: 2013-12-09 | Disposition: A | Discharge status: Home / Self Care | Payer: Self-pay | Attending: Emergency Medicine | Admitting: Emergency Medicine

## 2013-12-09 ENCOUNTER — Encounter (HOSPITAL_COMMUNITY): Payer: Self-pay | Admitting: Emergency Medicine

## 2013-12-09 DIAGNOSIS — R51 Headache: Secondary | ICD-10-CM | POA: Insufficient documentation

## 2013-12-09 DIAGNOSIS — F329 Major depressive disorder, single episode, unspecified: Secondary | ICD-10-CM | POA: Insufficient documentation

## 2013-12-09 DIAGNOSIS — F411 Generalized anxiety disorder: Secondary | ICD-10-CM | POA: Insufficient documentation

## 2013-12-09 DIAGNOSIS — Z79899 Other long term (current) drug therapy: Secondary | ICD-10-CM | POA: Insufficient documentation

## 2013-12-09 DIAGNOSIS — F988 Other specified behavioral and emotional disorders with onset usually occurring in childhood and adolescence: Secondary | ICD-10-CM | POA: Insufficient documentation

## 2013-12-09 DIAGNOSIS — F3289 Other specified depressive episodes: Secondary | ICD-10-CM | POA: Insufficient documentation

## 2013-12-09 DIAGNOSIS — Z880 Allergy status to penicillin: Secondary | ICD-10-CM | POA: Insufficient documentation

## 2013-12-09 DIAGNOSIS — M5412 Radiculopathy, cervical region: Secondary | ICD-10-CM

## 2013-12-09 DIAGNOSIS — Z8619 Personal history of other infectious and parasitic diseases: Secondary | ICD-10-CM | POA: Insufficient documentation

## 2013-12-09 DIAGNOSIS — G8929 Other chronic pain: Secondary | ICD-10-CM | POA: Insufficient documentation

## 2013-12-09 DIAGNOSIS — Z8739 Personal history of other diseases of the musculoskeletal system and connective tissue: Secondary | ICD-10-CM | POA: Insufficient documentation

## 2013-12-09 HISTORY — DX: Enthesopathy, unspecified: M77.9

## 2013-12-09 MED ORDER — DIAZEPAM 5 MG PO TABS
5.0000 mg | ORAL_TABLET | Freq: Once | ORAL | Status: AC
  Administered 2013-12-09: 5 mg via ORAL
  Filled 2013-12-09: qty 1

## 2013-12-09 MED ORDER — PREDNISONE 20 MG PO TABS: ORAL_TABLET | ORAL | Status: DC

## 2013-12-09 MED ORDER — DIAZEPAM 5 MG PO TABS: 5.0000 mg | ORAL_TABLET | Freq: Two times a day (BID) | ORAL | Status: DC

## 2013-12-09 MED ORDER — PREDNISONE 20 MG PO TABS
60.0000 mg | ORAL_TABLET | Freq: Once | ORAL | Status: AC
  Administered 2013-12-09: 60 mg via ORAL
  Filled 2013-12-09: qty 3

## 2013-12-09 MED ORDER — KETOROLAC TROMETHAMINE 60 MG/2ML IM SOLN
30.0000 mg | Freq: Once | INTRAMUSCULAR | Status: AC
  Administered 2013-12-09: 30 mg via INTRAMUSCULAR
  Filled 2013-12-09: qty 2

## 2013-12-09 NOTE — Discharge Instructions (Signed)
Take vicodin for breakthrough pain, do not drink alcohol, drive, care for children or do other critical tasks while taking vicodin.  Please follow with your primary care doctor in the next 2 days for a check-up. They must obtain records for further management.   Do not hesitate to return to the Emergency Department for any new, worsening or concerning symptoms.   Cervical Radiculopathy Cervical radiculopathy happens when a nerve in the neck is pinched or bruised by a slipped (herniated) disk or by arthritic changes in the bones of the cervical spine. This can occur due to an injury or as part of the normal aging process. Pressure on the cervical nerves can cause pain or numbness that runs from your neck all the way down into your arm and fingers. CAUSES  There are many possible causes, including:  Injury.  Muscle tightness in the neck from overuse.  Swollen, painful joints (arthritis).  Breakdown or degeneration in the bones and joints of the spine (spondylosis) due to aging.  Bone spurs that may develop near the cervical nerves. SYMPTOMS  Symptoms include pain, weakness, or numbness in the affected arm and hand. Pain can be severe or irritating. Symptoms may be worse when extending or turning the neck. DIAGNOSIS  Your caregiver will ask about your symptoms and do a physical exam. He or she may test your strength and reflexes. X-rays, CT scans, and MRI scans may be needed in cases of injury or if the symptoms do not go away after a period of time. Electromyography (EMG) or nerve conduction testing may be done to study how your nerves and muscles are working. TREATMENT  Your caregiver may recommend certain exercises to help relieve your symptoms. Cervical radiculopathy can, and often does, get better with time and treatment. If your problems continue, treatment options may include:  Wearing a soft collar for short periods of time.  Physical therapy to strengthen the neck  muscles.  Medicines, such as nonsteroidal anti-inflammatory drugs (NSAIDs), oral corticosteroids, or spinal injections.  Surgery. Different types of surgery may be done depending on the cause of your problems. HOME CARE INSTRUCTIONS   Put ice on the affected area.  Put ice in a plastic bag.  Place a towel between your skin and the bag.  Leave the ice on for 15-20 minutes, 03-04 times a day or as directed by your caregiver.  If ice does not help, you can try using heat. Take a warm shower or bath, or use a hot water bottle as directed by your caregiver.  You may try a gentle neck and shoulder massage.  Use a flat pillow when you sleep.  Only take over-the-counter or prescription medicines for pain, discomfort, or fever as directed by your caregiver.  If physical therapy was prescribed, follow your caregiver's directions.  If a soft collar was prescribed, use it as directed. SEEK IMMEDIATE MEDICAL CARE IF:   Your pain gets much worse and cannot be controlled with medicines.  You have weakness or numbness in your hand, arm, face, or leg.  You have a high fever or a stiff, rigid neck.  You lose bowel or bladder control (incontinence).  You have trouble with walking, balance, or speaking. MAKE SURE YOU:   Understand these instructions.  Will watch your condition.  Will get help right away if you are not doing well or get worse. Document Released: 03/08/2001 Document Revised: 09/05/2011 Document Reviewed: 01/25/2011 [facility] LLCExitCare Patient Information 2014 [employer], [place].

## 2013-12-09 NOTE — ED Notes (Signed)
Ice pack given until seen by provider

## 2013-12-09 NOTE — ED Provider Notes (Signed)
CSN: [id number]     Arrival date & time 12/09/13  1750 History   First MD Initiated Contact with Patient 12/09/13 1941     Chief Complaint  Patient presents with  . Neck Pain  . Headache     (Consider location/radiation/quality/duration/timing/severity/associated sxs/prior Treatment) HPI  Peggy Ruiz is a [age] y.o. female with past medical history of ADD, anxiety depression complaining of increasing pain in neck states it radiates up to the occipital region. Has been taking diclofenac and Robaxin at home with little relief. States occasionally when she moves around she gets a lightning bolt type paresthesia up to the occipital area Denies headache (noted this contradicts nursing note) denies fever, confusion, numbness, weakness, rash.  Patient lives at home with her parents. 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 She denies trauma. States that this is an exacerbation of her chronic pain with her primary care doctor has diagnosed her with a bone spur. Chart review shows anterior bone spur at C4-C5.   Past Medical History  Diagnosis Date  . Anxiety   . Depression   . Anorexia     at 15  . HPV (human papilloma virus) infection   . Attention deficit disorder (ADD)   . Bone spur    Past Surgical History  Procedure Laterality Date  . Hernia repair    . Eye surgery      for lazy eye   Family History  Problem Relation Age of Onset  . Fibromyalgia Mother   . Anxiety disorder Mother   . Depression Mother   . Depression Father   . Anxiety disorder Father    History  Substance Use Topics  . Smoking status: Never Smoker   . Smokeless tobacco: Not on file  . Alcohol Use: Yes     Comment: occasion   OB History   Grav Para Term Preterm Abortions TAB SAB Ect Mult Living                 Review of Systems  10 systems reviewed and found to be negative, except as noted in the HPI.  Allergies  Ciprofloxacin and  Penicillins  Home Medications   Prior to Admission medications   Medication Sig Start Date End Date Taking? Authorizing Provider  amphetamine-dextroamphetamine (ADDERALL) 20 MG tablet Take 20 mg by mouth 3 (three) times daily.   Yes Historical Provider, MD  Cholecalciferol (VITAMIN D-3) 5000 UNITS TABS Take 1 tablet by mouth daily.   Yes Historical Provider, MD  clonazePAM (KLONOPIN) 1 MG tablet Take 2 mg by mouth at bedtime.    Yes Historical Provider, MD  HYDROcodone-acetaminophen (NORCO/VICODIN) 5-325 MG per tablet Take 1-2 tablets by mouth every 4 (four) hours as needed for moderate pain. 11/20/13  Yes Shari A Upstill, PA-C  Multiple Vitamin (MULTIVITAMIN) tablet Take 1 tablet by mouth daily.   Yes Historical Provider, MD  Norgestimate-Eth Estradiol (ORTHO-CYCLEN, 28, PO) Take 1 each by mouth daily.   Yes Historical Provider, MD  OLANZapine (ZYPREXA) 7.5 MG tablet Take 7.5 mg by mouth at bedtime.   Yes Historical Provider, MD  PARoxetine (PAXIL) 30 MG tablet Take 30 mg by mouth at bedtime.   Yes Historical Provider, MD  diazepam (VALIUM) 5 MG tablet Take 1 tablet (5 mg total) by mouth 2 (two) times daily. 12/09/13   Muhamed Luecke, PA-C  predniSONE (DELTASONE) 20 MG tablet 3 tabs po daily x 3 days, then 2  tabs x 3 days, then 1.5 tabs x 3 days, then 1 tab x 3 days, then 0.5 tabs x 3 days 12/09/13   Joni ReiningNicole Maecie Sevcik, PA-C   BP 114/76  Pulse 80  Temp(Src) 98.6 F (37 C) (Oral)  Resp 16  SpO2 98%  LMP 11/13/2013 Physical Exam  Nursing note and vitals reviewed. Constitutional: She is oriented to person, place, and time. She appears well-developed and well-nourished. No distress.  HENT:  Head: Normocephalic and atraumatic.  Mouth/Throat: Oropharynx is clear and moist.  Eyes: Conjunctivae and EOM are normal. Pupils are equal, round, and reactive to light.  Neck: Normal range of motion. Erythema present.  Patient is has full range of motion to lateral rotation both the left and the right,  able to flex chin to chest, however this causes pain. Has tenderness to palpation diffusely in the paracervical musculature. No point tenderness to percussion along the spinal processes.   Cardiovascular: Normal rate, regular rhythm and intact distal pulses.   Pulmonary/Chest: Effort normal and breath sounds normal. No stridor. No respiratory distress. She has no wheezes. She has no rales. She exhibits no tenderness.  Abdominal: Soft. Bowel sounds are normal. She exhibits no distension and no mass. There is no tenderness. There is no rebound and no guarding.  Musculoskeletal: Normal range of motion. She exhibits tenderness. She exhibits no edema.  Neurological: She is alert and oriented to person, place, and time.  Follows commands, Clear, goal oriented speech, Strength is 5 out of 5x4 extremities, patient ambulates with a coordinated in nonantalgic gait. Sensation is grossly intact.   Skin: Skin is warm.  Psychiatric: She has a normal mood and affect.    ED Course  Procedures (including critical care time) Labs Review Labs Reviewed - No data to display  Imaging Review No results found.   EKG Interpretation None      MDM   Final diagnoses:  Cervical radiculopathy   Filed Vitals:   12/09/13 1815 12/09/13 2132  BP: 120/84 114/76  Pulse: 82 80  Temp: 98.6 F (37 C)   TempSrc: Oral   Resp: 16 16  SpO2: 98% 98%    Medications  ketorolac (TORADOL) injection 30 mg (30 mg Intramuscular Given 12/09/13 2026)  predniSONE (DELTASONE) tablet 60 mg (60 mg Oral Given 12/09/13 2026)  diazepam (VALIUM) tablet 5 mg (5 mg Oral Given 12/09/13 2030)    Peggy Ruiz is a 28 y.o. female presenting with exacerbation of chronic neck pain. Patient has good range of motion, no fever, no headache. Doubt meningitis. Has appointment with Dr. Rosezena Sensoramone centralized. Encouraged to follow closely with primary care.  Evaluation does not show pathology that would require ongoing emergent intervention or  inpatient treatment. Pt is hemodynamically stable and mentating appropriately. Discussed findings and plan with patient/guardian, who agrees with care plan. All questions answered. Return precautions discussed and outpatient follow up given.   Discharge Medication List as of 12/09/2013  8:36 PM    START taking these medications   Details  diazepam (VALIUM) 5 MG tablet Take 1 tablet (5 mg total) by mouth 2 (two) times daily., Starting 12/09/2013, Until Discontinued, Print    predniSONE (DELTASONE) 20 MG tablet 3 tabs po daily x 3 days, then 2 tabs x 3 days, then 1.5 tabs x 3 days, then 1 tab x 3 days, then 0.5 tabs x 3 days, Print           Wynetta Emeryicole Chong Wojdyla, PA-C 12/10/13 0221

## 2013-12-09 NOTE — ED Notes (Signed)
Pt states was diagnosed w/ bone spurs in neck, states having severe pain in neck radiating up to head making her had a headache and dizziness.

## 2013-12-09 NOTE — ED Notes (Addendum)
PT was seen at the first week of June and was told for her to ask the ED to perform an MRI. Her PCP also prescribed Voltaren and Robaxin and she has not had any relief. She reports that she has not injured her self in any way and no recent extra exercise. Pt is tearful. Has been DX with bone spurs. It is painful for patient to touch chin to chest and reports sharp shooting pain in she tries.

## 2013-12-12 NOTE — ED Provider Notes (Signed)
Medical screening examination/treatment/procedure(s) were performed by non-physician practitioner and as supervising physician I was immediately available for consultation/collaboration.   EKG Interpretation None       Ruble Buttler N Gurkaran Rahm, DO 12/12/13 0702 

## 2017-04-07 ENCOUNTER — Emergency Department (HOSPITAL_COMMUNITY)
Admission: EM | Admit: 2017-04-07 | Discharge: 2017-04-07 | Disposition: A | Discharge status: Home / Self Care | Payer: BLUE CROSS/BLUE SHIELD | Attending: Emergency Medicine | Admitting: Emergency Medicine

## 2017-04-07 ENCOUNTER — Encounter (HOSPITAL_COMMUNITY): Payer: Self-pay | Admitting: Emergency Medicine

## 2017-04-07 DIAGNOSIS — Z79899 Other long term (current) drug therapy: Secondary | ICD-10-CM | POA: Insufficient documentation

## 2017-04-07 DIAGNOSIS — T597X1A Toxic effect of carbon dioxide, accidental (unintentional), initial encounter: Secondary | ICD-10-CM | POA: Diagnosis not present

## 2017-04-07 DIAGNOSIS — T5891XA Toxic effect of carbon monoxide from unspecified source, accidental (unintentional), initial encounter: Secondary | ICD-10-CM

## 2017-04-07 DIAGNOSIS — R4182 Altered mental status, unspecified: Secondary | ICD-10-CM | POA: Diagnosis present

## 2017-04-07 LAB — CBC WITH DIFFERENTIAL/PLATELET
Basophils Absolute: 0 10*3/uL (ref 0.0–0.1)
Basophils Relative: 0 %
Eosinophils Absolute: 0.1 10*3/uL (ref 0.0–0.7)
Eosinophils Relative: 2 %
HCT: 35.9 % — ABNORMAL LOW (ref 36.0–46.0)
Hemoglobin: 12.6 g/dL (ref 12.0–15.0)
Lymphocytes Relative: 42 %
Lymphs Abs: 2.2 10*3/uL (ref 0.7–4.0)
MCH: 30.8 pg (ref 26.0–34.0)
MCHC: 35.1 g/dL (ref 30.0–36.0)
MCV: 87.8 fL (ref 78.0–100.0)
Monocytes Absolute: 0.4 10*3/uL (ref 0.1–1.0)
Monocytes Relative: 7 %
Neutro Abs: 2.5 10*3/uL (ref 1.7–7.7)
Neutrophils Relative %: 49 %
Platelets: 245 10*3/uL (ref 150–400)
RBC: 4.09 MIL/uL (ref 3.87–5.11)
RDW: 12.2 % (ref 11.5–15.5)
WBC: 5.2 10*3/uL (ref 4.0–10.5)

## 2017-04-07 LAB — COMPREHENSIVE METABOLIC PANEL
ALT: 20 U/L (ref 14–54)
AST: 22 U/L (ref 15–41)
Albumin: 4.3 g/dL (ref 3.5–5.0)
Alkaline Phosphatase: 66 U/L (ref 38–126)
Anion gap: 9 (ref 5–15)
BUN: 8 mg/dL (ref 6–20)
CO2: 23 mmol/L (ref 22–32)
Calcium: 9.1 mg/dL (ref 8.9–10.3)
Chloride: 100 mmol/L — ABNORMAL LOW (ref 101–111)
Creatinine, Ser: 1.07 mg/dL — ABNORMAL HIGH (ref 0.44–1.00)
GFR calc Af Amer: >60 mL/min (ref >60)
GFR calc non Af Amer: >60 mL/min (ref >60)
Glucose, Bld: 87 mg/dL (ref 65–99)
Potassium: 4.1 mmol/L (ref 3.5–5.1)
Sodium: 132 mmol/L — ABNORMAL LOW (ref 135–145)
Total Bilirubin: 0.5 mg/dL (ref 0.3–1.2)
Total Protein: 7.1 g/dL (ref 6.5–8.1)

## 2017-04-07 LAB — I-STAT ARTERIAL BLOOD GAS, ED
Acid-Base Excess: 1 mmol/L (ref 0.0–2.0)
Bicarbonate: 26.1 mmol/L (ref 20.0–28.0)
O2 Saturation: 100 %
Patient temperature: 98.6
TCO2: 27 mmol/L (ref 22–32)
pCO2 arterial: 40.2 mmHg (ref 32.0–48.0)
pH, Arterial: 7.42 (ref 7.350–7.450)
pO2, Arterial: 455 mmHg — ABNORMAL HIGH (ref 83.0–108.0)

## 2017-04-07 LAB — COOXEMETRY PANEL
Carboxyhemoglobin: 22.1 % (ref 0.5–1.5)
Carboxyhemoglobin: 1.3 % (ref 0.5–1.5)
Methemoglobin: 1.3 % (ref 0.0–1.5)
Methemoglobin: 1.1 % (ref 0.0–1.5)
O2 Saturation: 56 %
O2 Saturation: 81.5 %
Total hemoglobin: 12.2 g/dL (ref 12.0–16.0)
Total hemoglobin: 11.9 g/dL — ABNORMAL LOW (ref 12.0–16.0)

## 2017-04-07 LAB — HCG, SERUM, QUALITATIVE: Preg, Serum: NEGATIVE

## 2017-04-07 MED ORDER — ONDANSETRON HCL 4 MG/2ML IJ SOLN
4.0000 mg | Freq: Once | INTRAMUSCULAR | Status: AC
  Administered 2017-04-07: 4 mg via INTRAVENOUS
  Filled 2017-04-07: qty 2

## 2017-04-07 MED ORDER — SODIUM CHLORIDE 0.9 % IV BOLUS (SEPSIS)
1000.0000 mL | Freq: Once | INTRAVENOUS | Status: AC
  Administered 2017-04-07: 1000 mL via INTRAVENOUS

## 2017-04-07 NOTE — ED Notes (Signed)
The pt is alert her headache is mild at present.  She is due for another blood test.  Alert oriented skin warm and dry  No distress
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
## 2017-04-07 NOTE — ED Notes (Signed)
Pt ambulated to restroom, no issues, pt reports feeling much better, no dizziness continues to have mild headache.

## 2017-04-07 NOTE — ED Notes (Signed)
ED Provider at bedside. 

## 2017-06-26 ENCOUNTER — Emergency Department
Admission: EM | Admit: 2017-06-26 | Discharge: 2017-06-26 | Disposition: A | Discharge status: Home / Self Care | Payer: BLUE CROSS/BLUE SHIELD | Attending: Emergency Medicine | Admitting: Emergency Medicine

## 2017-06-26 ENCOUNTER — Other Ambulatory Visit: Payer: Self-pay

## 2017-06-26 ENCOUNTER — Emergency Department: Payer: BLUE CROSS/BLUE SHIELD

## 2017-06-26 ENCOUNTER — Encounter: Payer: Self-pay | Admitting: Emergency Medicine

## 2017-06-26 DIAGNOSIS — M7711 Lateral epicondylitis, right elbow: Secondary | ICD-10-CM | POA: Diagnosis not present

## 2017-06-26 DIAGNOSIS — M79601 Pain in right arm: Secondary | ICD-10-CM | POA: Diagnosis present

## 2017-06-26 DIAGNOSIS — Z79899 Other long term (current) drug therapy: Secondary | ICD-10-CM | POA: Diagnosis not present

## 2017-06-26 DIAGNOSIS — R609 Edema, unspecified: Secondary | ICD-10-CM

## 2017-06-26 LAB — COMPREHENSIVE METABOLIC PANEL
ALT: 14 U/L (ref 14–54)
AST: 19 U/L (ref 15–41)
Albumin: 4.5 g/dL (ref 3.5–5.0)
Alkaline Phosphatase: 49 U/L (ref 38–126)
Anion gap: 9 (ref 5–15)
BUN: 9 mg/dL (ref 6–20)
CO2: 25 mmol/L (ref 22–32)
Calcium: 9.1 mg/dL (ref 8.9–10.3)
Chloride: 105 mmol/L (ref 101–111)
Creatinine, Ser: 0.88 mg/dL (ref 0.44–1.00)
GFR calc Af Amer: >60 mL/min (ref >60)
GFR calc non Af Amer: >60 mL/min (ref >60)
Glucose, Bld: 110 mg/dL — ABNORMAL HIGH (ref 65–99)
Potassium: 3.7 mmol/L (ref 3.5–5.1)
Sodium: 139 mmol/L (ref 135–145)
Total Bilirubin: 0.5 mg/dL (ref 0.3–1.2)
Total Protein: 7.4 g/dL (ref 6.5–8.1)

## 2017-06-26 LAB — CBC
HCT: 34.8 % — ABNORMAL LOW (ref 35.0–47.0)
Hemoglobin: 12.1 g/dL (ref 12.0–16.0)
MCH: 31.5 pg (ref 26.0–34.0)
MCHC: 34.9 g/dL (ref 32.0–36.0)
MCV: 90.3 fL (ref 80.0–100.0)
Platelets: 246 10*3/uL (ref 150–440)
RBC: 3.85 MIL/uL (ref 3.80–5.20)
RDW: 11.9 % (ref 11.5–14.5)
WBC: 6.4 10*3/uL (ref 3.6–11.0)

## 2017-06-26 LAB — FIBRIN DERIVATIVES D-DIMER (ARMC ONLY): Fibrin derivatives D-dimer (ARMC): 302.9 ng/mL (FEU) (ref 0.00–499.00)

## 2017-06-26 MED ORDER — TRAMADOL HCL 50 MG PO TABS: 50.0000 mg | ORAL_TABLET | Freq: Four times a day (QID) | ORAL | 0 refills | Status: DC | PRN

## 2017-06-26 MED ORDER — LIDOCAINE 5 % EX PTCH
1.0000 | MEDICATED_PATCH | CUTANEOUS | Status: DC
  Administered 2017-06-26: 1 via TRANSDERMAL
  Filled 2017-06-26: qty 1

## 2017-06-26 MED ORDER — LIDOCAINE 5 % EX PTCH: 1.0000 | MEDICATED_PATCH | CUTANEOUS | 0 refills | Status: DC

## 2017-06-26 MED ORDER — TRAMADOL HCL 50 MG PO TABS
100.0000 mg | ORAL_TABLET | Freq: Once | ORAL | Status: AC
  Administered 2017-06-26: 100 mg via ORAL
  Filled 2017-06-26: qty 2

## 2017-06-26 NOTE — ED Provider Notes (Signed)
[facility] Emergency Department Provider Note   First MD Initiated Contact with Patient 06/26/17 [phone]     (approximate)  I have reviewed the triage vital signs and the nursing notes.   HISTORY  Chief Complaint Numbness; Arm Pain; Joint Swelling; and Cold Extremity   HPI Peggy Ruiz is a 31 y.o. female with below list of chronic medical conditions including idiopathic gastroparesis and fibromyalgia presents to the emergency department with nontraumatic right elbow/forearm pain current pain score of 7 out of 10.  Patient states that pain began today and has been persistent since onset.  In addition the patient admits to chronic cold intolerance in the upper and lower extremities.  Patient states that right hand coldness which has improved since arriving to the emergency department.   Past Medical History:  Diagnosis Date  . Anorexia    at 15  . Anxiety   . Attention deficit disorder (ADD)   . Bone spur   . Depression   . HPV (human papilloma virus) infection     Patient Active Problem List   Diagnosis Date Noted  . Other malaise and fatigue 03/14/2013  . Unspecified contraceptive management 03/14/2013    Past Surgical History:  Procedure Laterality Date  . EYE SURGERY     for lazy eye  . HERNIA REPAIR      Prior to Admission medications   Medication Sig Start Date End Date Taking? Authorizing Provider  chlordiazePOXIDE (LIBRIUM) 10 MG capsule Take 10-20 mg by mouth See admin instructions. Take 10mg  in the morning, and 20mg  at bedtime    [provider]  clobetasol ointment (TEMOVATE) 0.05 % Apply 1 application topically 2 (two) times daily as needed for rash. 03/08/17 03/08/18  [provider]  clonazePAM (KLONOPIN) 0.5 MG tablet Take 0.5 mg by mouth 2 (two) times daily.     [provider]  dexlansoprazole (DEXILANT) 60 MG capsule Take 60 mg by mouth every morning. 03/22/17   [provider]  diazepam  (VALIUM) 5 MG tablet Take 1 tablet (5 mg total) by mouth 2 (two) times daily. Patient not taking: Reported on 04/07/2017 12/09/13   Pisciotta, Joni Reining, PA-C  DULoxetine (CYMBALTA) 30 MG capsule Take 30 mg by mouth every morning. 01/02/17   [provider]  Erenumab-aooe 70 MG/ML SOAJ Inject 140 mg into the skin every 30 (thirty) days.  01/23/17   [provider]  fluticasone (FLONASE) 50 MCG/ACT nasal spray Place 2 sprays into both nostrils daily. 01/18/17   [provider]  gabapentin (NEURONTIN) 300 MG capsule Take 900 mg by mouth 2 (two) times daily. 10/05/16 10/05/17  [provider]  HYDROcodone-acetaminophen (NORCO/VICODIN) 5-325 MG per tablet Take 1-2 tablets by mouth every 4 (four) hours as needed for moderate pain. Patient not taking: Reported on 04/07/2017 11/20/13   Elpidio Anis, PA-C  lactulose (CHRONULAC) 10 GM/15ML solution Take 15 mLs by mouth 3 (three) times daily. 02/24/17   [provider]  levonorgestrel-ethinyl estradiol (SEASONALE,INTROVALE,JOLESSA) 0.15-0.03 MG tablet Take 1 tablet by mouth daily. 01/31/17 01/31/18  [provider]  Lifitegrast Benay Spice) 5 % SOLN Place 1 drop into both eyes 2 (two) times daily. 04/04/17   [provider]  methocarbamol (ROBAXIN) 750 MG tablet Take 750 mg by mouth every 8 (eight) hours.    [provider]  Multiple Vitamin (MULTIVITAMIN) tablet Take 1 tablet by mouth daily.    [provider]  nortriptyline (PAMELOR) 10 MG capsule Take 10 mg by  mouth at bedtime. 02/23/17   [provider]  ondansetron (ZOFRAN-ODT) 8 MG disintegrating tablet Take 8 mg by mouth every 8 (eight) hours as needed for nausea. 01/02/17   [provider]  oxcarbazepine (TRILEPTAL) 600 MG tablet Take 600 mg by mouth at bedtime. 01/03/17   [provider]    Allergies Promethazine; Ciprofloxacin; Dilaudid [hydromorphone]; Penicillins; and Toradol [ketorolac tromethamine]  Family  History  Problem Relation Age of Onset  . Fibromyalgia Mother   . Anxiety disorder Mother   . Depression Mother   . Depression Father   . Anxiety disorder Father     Social History Social History   Tobacco Use  . Smoking status: Never Smoker  . Smokeless tobacco: Never Used  Substance Use Topics  . Alcohol use: Yes    Comment: occasion  . Drug use: No    Review of Systems Constitutional: No fever/chills Eyes: No visual changes. ENT: No sore throat. Cardiovascular: Denies chest pain. Respiratory: Denies shortness of breath. Gastrointestinal: No abdominal pain.  No nausea, no vomiting.  No diarrhea.  No constipation. Genitourinary: Negative for dysuria. Musculoskeletal: Negative for neck pain.  Negative for back pain.  Positive right elbow pain Integumentary: Negative for rash. Neurological: Negative for headaches, focal weakness or numbness.  ____________________________________________   PHYSICAL EXAM:  VITAL SIGNS: ED Triage Vitals  Enc Vitals Group     BP 06/26/17 0024 122/80     Pulse Rate 06/26/17 0024 88     Resp 06/26/17 0024 18     Temp 06/26/17 0024 98.3 F (36.8 C)     Temp Source 06/26/17 0024 Oral     SpO2 06/26/17 0024 100 %     Weight 06/26/17 0025 62.6 kg (138 lb)     Height 06/26/17 0025 1.727 m (5\' 8" )     Head Circumference --      Peak Flow --      Pain Score 06/26/17 0023 7     Pain Loc --      Pain Edu? --      Excl. in GC? --     Constitutional: Alert and oriented. Well appearing and in no acute distress. Eyes: Conjunctivae are normal. Head: Atraumatic. Mouth/Throat: Mucous membranes are moist. Oropharynx non-erythematous. Neck: No stridor.   Cardiovascular: Normal rate, regular rhythm. Good peripheral circulation. Grossly normal heart sounds.  Palpable radial and ulnar pulses bilaterally equal normal Allen's tests of the right hand Respiratory: Normal respiratory effort.  No retractions. Lungs CTAB. Gastrointestinal: Soft and  nontender. No distention.  Musculoskeletal: Pain with palpation in the area of the common extensor tendon of the right forearm just anterior to the lateral epicondyle Neurologic:  Normal speech and language. No gross focal neurologic deficits are appreciated.  Skin:  Skin is warm, dry and intact. No rash noted. Psychiatric: Mood and affect are normal. Speech and behavior are normal.  ____________________________________________   LABS (all labs ordered are listed, but only abnormal results are displayed)  Labs Reviewed  CBC - Abnormal; Notable for the following components:      Result Value   HCT 34.8 (*)    All other components within normal limits  COMPREHENSIVE METABOLIC PANEL - Abnormal; Notable for the following components:   Glucose, Bld 110 (*)    All other components within normal limits  FIBRIN DERIVATIVES D-DIMER ([facility] ONLY)  POC URINE PREG, ED    RADIOLOGY I, Hernandez N [place], personally viewed and evaluated these images (plain radiographs) as part of my  medical decision making, as well as reviewing the written report by the radiologist.  [place] Venous Img Upper Uni Right  Result Date: 06/26/2017 CLINICAL DATA:  Acute onset of right arm pain and swelling. Initial encounter. EXAM: RIGHT UPPER EXTREMITY VENOUS DOPPLER ULTRASOUND TECHNIQUE: Gray-scale sonography with graded compression, as well as color Doppler and duplex ultrasound were performed to evaluate the upper extremity deep venous system from the level of the subclavian vein and including the jugular, axillary, basilic, radial, ulnar and upper cephalic vein. Spectral Doppler was utilized to evaluate flow at rest and with distal augmentation maneuvers. COMPARISON:  None. FINDINGS: Contralateral Subclavian Vein: Respiratory phasicity is normal and symmetric with the symptomatic side. No evidence of thrombus. Normal compressibility. Internal Jugular Vein: No evidence of thrombus. Normal compressibility, respiratory phasicity  and response to augmentation. Subclavian Vein: No evidence of thrombus. Normal compressibility, respiratory phasicity and response to augmentation. Axillary Vein: No evidence of thrombus. Normal compressibility, respiratory phasicity and response to augmentation. Cephalic Vein: No evidence of thrombus. Normal compressibility, respiratory phasicity and response to augmentation. Basilic Vein: No evidence of thrombus. Normal compressibility, respiratory phasicity and response to augmentation. Brachial Veins: No evidence of thrombus. Normal compressibility, respiratory phasicity and response to augmentation. Radial Veins: No evidence of thrombus. Normal compressibility, respiratory phasicity and response to augmentation. Ulnar Veins: No evidence of thrombus. Normal compressibility, respiratory phasicity and response to augmentation. Venous Reflux:  None visualized. Other Findings:  None visualized. IMPRESSION: No evidence of DVT within the right upper extremity. Electronically Signed   By: Roanna RaiderJeffery  Chang M.D.   On: 06/26/2017 02:30      Procedures   ____________________________________________   INITIAL IMPRESSION / ASSESSMENT AND PLAN / ED COURSE  As part of my medical decision making, I reviewed the following data within the electronic MEDICAL RECORD [id number] year old female presented with a history of physical exam consistent with possible lateral epicondylitis ____________________________________________  FINAL CLINICAL IMPRESSION(S) / ED DIAGNOSES  Final diagnoses:  Right lateral epicondylitis     MEDICATIONS GIVEN DURING THIS VISIT:  Medications  lidocaine (LIDODERM) 5 % 1 patch (not administered)  traMADol (ULTRAM) tablet 100 mg (not administered)     ED Discharge Orders    None       Note:  This document was prepared using Dragon voice recognition software and may include unintentional dictation errors.    Darci CurrentBrown, [place] N, MD 06/26/17 0500

## 2017-06-26 NOTE — ED Triage Notes (Signed)
Pt arrived to the ED accompanied by her husband for complaints of right elbow pain, right arm pain/coldness. Pt reports that today she noticed that her right elbow was swollen without injury and later on developed pain and coldness on right arm. Pt has bilateral radial and ulnar pulses with weak pulses on right arm, modified Allan test shows decreased circulation on right arm ulnar. Pt has decreased sensation on 4th and fifth digit on right hand. Pt is AOx4 in no apparent distress mildly anxious during triage.

## 2017-06-26 NOTE — ED Notes (Signed)
Dr Brown in to assess pt.

## 2017-10-02 DIAGNOSIS — R102 Pelvic and perineal pain: Secondary | ICD-10-CM | POA: Insufficient documentation

## 2018-03-31 DIAGNOSIS — F39 Unspecified mood [affective] disorder: Secondary | ICD-10-CM

## 2018-03-31 DIAGNOSIS — F4310 Post-traumatic stress disorder, unspecified: Secondary | ICD-10-CM

## 2018-04-04 ENCOUNTER — Ambulatory Visit (INDEPENDENT_AMBULATORY_CARE_PROVIDER_SITE_OTHER): Payer: BLUE CROSS/BLUE SHIELD | Admitting: Psychiatry

## 2018-04-04 DIAGNOSIS — F3340 Major depressive disorder, recurrent, in remission, unspecified: Secondary | ICD-10-CM

## 2018-04-04 DIAGNOSIS — F411 Generalized anxiety disorder: Secondary | ICD-10-CM | POA: Diagnosis not present

## 2018-04-04 DIAGNOSIS — R109 Unspecified abdominal pain: Secondary | ICD-10-CM | POA: Insufficient documentation

## 2018-04-04 DIAGNOSIS — F4310 Post-traumatic stress disorder, unspecified: Secondary | ICD-10-CM | POA: Diagnosis not present

## 2018-04-04 NOTE — Progress Notes (Signed)
[facility] Counselor/Therapist Progress Note   Patient ID: Peggy Ruiz, MRN: [id number]  Date: 04/04/2018  Timespent: 69 Start time: 3:21p Stop time: 4:30p  Treatment Type: Individual  Subjective:  10 days to moving day, stress mounting.  Mother keeps pitching into crying fits every day, father calling the clinic daily, PT says all but 1-2 hrs of the day are marked by mother crying.  Despite agreements not to pester PT, father keeps asking PT what to do.  H away at work 12.5 hrs a day.  Gets very tense, feels urgent feelings mounting, worries whether parents will be OK without her, worries about failing them   Found a church group for childless couples -- very helpful and hopeful, and the group leader has offered lunch and packing help.    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    Interventions:CBT, Solution Focused, Assertiveness Training, Psychosocial Skills: Empathic response to intense emotionality, Family Systems and Reframing Empathized with all stresses.  Conducted in-the-moment relaxation practice to de-escalate anxiety and kindling panic talking about issues at home, using distancing imagery, body awareness, and mindful attention.  Strategized for mother's care, recommending advise willingness to seek IOP, PHS, or Inpt.  Coached in calmer nonverbals, self-soothing, and redirecting mother to get out of her head, out of her feelings, change of scenery, body in motion, as appropriate.  Affirmed it is the right choice to move, regardless of hardships, and reframed as necessary, maturing, love, actually, for both mother and father -- a nonverbal vote of confidence that they will recognize on a nonverbal level.  Taught mindful grounding skill in session --listening to sounds in the room, looking for interesting visual stimulating in the room, and then noting relaxing muscles, more open  breathing, and a quieter mind.  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  Discussed disaster scenario of parents failing to adjust and cope without her presence in the home, resolving both that they will be able to be resourceful and that she will be available.  Discussed particular scenario of inviting a church friend over to help pack, asking mother to reconsider forbidding strangers in the house because of her own, self perceived fragile emotional condition.  Framed mother's emotional state, and the family's emotional vulnerability, as a form of hypnosis to be controlled by the contributions each member can make, specifically bringing their own calm to chaos.  Offered humorous images and scenarios by which to relax as well.  Oriented to rights and responsibilities and opportunities available in moving to electronic medical records and reviewed relevant problems and diagnoses listed on e-chart for accuracy, status, and consent to publish within the healthcare environment.  Mental Status Exam:   Appearance:   Casual     Behavior:  Appropriate, Sharing, Agitated, Care-Taking and Motivated  Motor:  Normal  Speech/Language:   Clear and Coherent, Pressured and responsive to therapy   Affect:  Appropriate, Tearful and tense approrpriate to content  Mood:  anxious  Thought process:  Relevant and some pressure  Thought content:    Logical and worrisome  Perceptual disturbances:    Normal  Orientation:  Full (Time, Place,  and Person)  Attention:  Good  Concentration:  good  Memory:  N/A  Fund of knowledge:   NA  Insight:    Good  Judgment:   Good  Impulse Control:  fair    Reported Symptoms:    Risk Assessment: Danger to Self:  No Self-injurious  Behavior: No Danger to Others: No Duty to Warn:no Physical Aggression / Violence:No  Access to Firearms a concern: No  Gang Involvement:No   Diagnosis:   ICD-10-CM   1. Generalized anxiety disorder F41.1   2. Recurrent major depressive disorder, in remission (HCC) F33.40   3. PTSD (post-traumatic stress disorder) F43.10         Plan:  . Use skills and communications approaches discussed above . Continue to utilize previously learned skills ad lib . Maintain medication, if prescribed, and work faithfully with relevant prescriber(s) . Call the clinic on-call service, present to ER, or call 911 if any life-threatening emergency   Robley Fries, PhD

## 2018-04-04 NOTE — Patient Instructions (Signed)
Priority recommendations  Remember your mother is bets comforted by whatever peace you can bring to the moment yourself -- breathe, pray, let your nonverbals show her you trust this to pass  It's OK to remind your father he is asking the same question again, and just offer that either you have no new answers, it's a question for the doctors, or it's actually OK to seek a higher level of treatment (IOP, PHS, Inpt).  In comforting your mother, best moves seem to be to encourage movement (while still being free to emote), e.g., walk in the house or outside, and grounding skills like opening her eyes, telling a story, naming things in the room, or anything else that would be "de-hypnotizing" about her suffering.  Validate and switch attention.  It's OK to bring your clinical mind to serving your mother's comfort.  It is not cold; it comes from a place of deep love and trust that she herself is having  Hard time contacting.  By bringing your sobriety to it, you communicate without words that she is more capable than she knows.  Find the laughs wherever you can -- you relax, everybody who so critically depends on you for life, liberty, and the pursuit of happiness will benefit as well.

## 2018-04-12 ENCOUNTER — Encounter: Payer: Self-pay | Admitting: Psychiatry

## 2018-04-12 ENCOUNTER — Ambulatory Visit (INDEPENDENT_AMBULATORY_CARE_PROVIDER_SITE_OTHER): Payer: BLUE CROSS/BLUE SHIELD | Admitting: Psychiatry

## 2018-04-12 VITALS — Ht 69.0 in | Wt 145.0 lb

## 2018-04-12 DIAGNOSIS — F5101 Primary insomnia: Secondary | ICD-10-CM

## 2018-04-12 DIAGNOSIS — F4310 Post-traumatic stress disorder, unspecified: Secondary | ICD-10-CM

## 2018-04-12 DIAGNOSIS — F39 Unspecified mood [affective] disorder: Secondary | ICD-10-CM | POA: Diagnosis not present

## 2018-04-12 MED ORDER — CHLORDIAZEPOXIDE HCL 10 MG PO CAPS: 10.0000 mg | ORAL_CAPSULE | ORAL | 2 refills | Status: DC

## 2018-04-12 MED ORDER — OXCARBAZEPINE 600 MG PO TABS: 600.0000 mg | ORAL_TABLET | Freq: Every day | ORAL | 5 refills | Status: DC

## 2018-04-12 MED ORDER — CLONAZEPAM 0.5 MG PO TABS: 0.5000 mg | ORAL_TABLET | ORAL | 2 refills | Status: DC

## 2018-04-12 NOTE — Progress Notes (Signed)
Peggy Ruiz [id number] January 05, 1986 32 y.o.  Subjective:   Patient ID:  Peggy Ruiz is a 32 y.o. (DOB Apr 20, 1986) female.  Chief Complaint:  Chief Complaint  Patient presents with  . Follow-up    Anxiety, mood, and insomnia    HPI Peggy Ruiz presents to the office today for follow-up of mood, anxiety, and insomnia. Will be moving out of her parents' house in 2 days to [place], [place] and she and her husband will have a home of their own. She reports that she has some anxiety about them being on their own for the first time. "I think it is the anxiety anybody would have." Reports that she has had difficulty falling asleep the last 3 nights, which is unusual for. She reports that she has had trouble falling asleep due to thinking about all the things she needs to do related to the move. Also has an event to go to for husband's family the day of the move. Reports some worry about her mother who has been having difficulty with her mental health and medications. Reports that these types of things trigger past traumatic events where she felt responsible for everyone else. Reports that processing these things with her therapist was helpful for her anxiety and she has been using tools to help manage anxiety. Reports that she had severe irritability and was easily upset before seeing her therapist. She reports that her mood has been "pretty stable" after processing events and having some discussions with husband. Energy and motivation have been good and she has been making preparations for the move. Appetite has been "good" and reports that weight has been stable. Reports adequate food intake. Reports improved concentration and was recently able to read more. Denies SI.    Medications: I have reviewed the patient's current medications.  Medication Side Effects: None  Allergies:  Allergies  Allergen Reactions  . Promethazine     Other reaction(s): Agitation  . Ciprofloxacin Other (See Comments)     c-diff  . Dilaudid [Hydromorphone]     sob  . Penicillins Hives    Has patient had a PCN reaction causing immediate rash, facial/tongue/throat swelling, SOB or lightheadedness with hypotension: Yes Has patient had a PCN reaction causing severe rash involving mucus membranes or skin necrosis: No Has patient had a PCN reaction that required hospitalization: No Has patient had a PCN reaction occurring within the last 10 years: Yes If all of the above answers are "NO", then may proceed with Cephalosporin use.   . Toradol [Ketorolac Tromethamine] Nausea And Vomiting    Past Medical History:  Diagnosis Date  . Anorexia    at 15  . Anxiety   . Attention deficit disorder (ADD)   . Bone spur   . Depression   . HPV (human papilloma virus) infection     Family History  Problem Relation Age of Onset  . Fibromyalgia Mother   . Anxiety disorder Mother   . Depression Mother   . Depression Father   . Anxiety disorder Father     Social History   Socioeconomic History  . Marital status: Single    Spouse name: Not on file  . Number of children: Not on file  . Years of education: Not on file  . Highest education level: Not on file  Occupational History  . Not on file  Social Needs  . Financial resource strain: Not on file  . Food insecurity:    Worry: Not on file  Inability: Not on file  . Transportation needs:    Medical: Not on file    Non-medical: Not on file  Tobacco Use  . Smoking status: Never Smoker  . Smokeless tobacco: Never Used  Substance and Sexual Activity  . Alcohol use: Yes    Comment: occasion  . Drug use: No  . Sexual activity: Never  Lifestyle  . Physical activity:    Days per week: Not on file    Minutes per session: Not on file  . Stress: Not on file  Relationships  . Social connections:    Talks on phone: Not on file    Gets together: Not on file    Attends religious service: Not on file    Active member of club or organization: Not on file     Attends meetings of clubs or organizations: Not on file    Relationship status: Not on file  . Intimate partner violence:    Fear of current or ex partner: Not on file    Emotionally abused: Not on file    Physically abused: Not on file    Forced sexual activity: Not on file  Other Topics Concern  . Not on file  Social History Narrative   Regular exercise: none   Caffeine use: daily    Past Medical History, Surgical history, Social history, and Family history were reviewed and updated as appropriate.   Please see review of systems for further details on the patient's review from today.   Review of Systems:  Review of Systems  Constitutional: Negative for appetite change and unexpected weight change.  Gastrointestinal: Positive for abdominal distention and abdominal pain.  Musculoskeletal: Positive for back pain and myalgias.    Objective:   Physical Exam:  Ht 5\' 9"  (1.753 m)   Wt 145 lb (65.8 kg)   BMI 21.41 kg/m   Physical Exam  Constitutional: She is oriented to person, place, and time. She appears well-developed. No distress.  Musculoskeletal: Normal range of motion.  Neurological: She is alert and oriented to person, place, and time. Coordination normal.  Psychiatric: Her speech is normal and behavior is normal. Judgment and thought content normal. Her mood appears not anxious. Her affect is not blunt and not inappropriate. Cognition and memory are normal. She expresses no homicidal and no suicidal ideation. She expresses no suicidal plans and no homicidal plans.  Mildly anxious when discussing upcoming move    Lab Review:     Component Value Date/Time   NA 139 06/26/2017 0025   NA 138 08/31/2011 1624   K 3.7 06/26/2017 0025   K 4.1 08/31/2011 1624   CL 105 06/26/2017 0025   CL 102 08/31/2011 1624   CO2 25 06/26/2017 0025   CO2 27 08/31/2011 1624   GLUCOSE 110 (H) 06/26/2017 0025   GLUCOSE 81 08/31/2011 1624   BUN 9 06/26/2017 0025   BUN 10 08/31/2011  1624   CREATININE 0.88 06/26/2017 0025   CREATININE 0.88 08/31/2011 1624   CALCIUM 9.1 06/26/2017 0025   CALCIUM 9.1 08/31/2011 1624   PROT 7.4 06/26/2017 0025   PROT 7.8 08/31/2011 1624   ALBUMIN 4.5 06/26/2017 0025   ALBUMIN 4.0 08/31/2011 1624   AST 19 06/26/2017 0025   AST 25 08/31/2011 1624   ALT 14 06/26/2017 0025   ALT 24 08/31/2011 1624   ALKPHOS 49 06/26/2017 0025   ALKPHOS 52 08/31/2011 1624   BILITOT 0.5 06/26/2017 0025   BILITOT 0.3 08/31/2011 1624   GFRNONAA >  60 06/26/2017 0025   GFRNONAA >60 08/31/2011 1624   GFRAA >60 06/26/2017 0025   GFRAA >60 08/31/2011 1624       Component Value Date/Time   WBC 6.4 06/26/2017 0025   RBC 3.85 06/26/2017 0025   HGB 12.1 06/26/2017 0025   HGB 12.7 08/31/2011 1624   HCT 34.8 (L) 06/26/2017 0025   HCT 37.7 08/31/2011 1624   PLT 246 06/26/2017 0025   PLT 248 08/31/2011 1624   MCV 90.3 06/26/2017 0025   MCV 90 08/31/2011 1624   MCH 31.5 06/26/2017 0025   MCHC 34.9 06/26/2017 0025   RDW 11.9 06/26/2017 0025   RDW 12.6 08/31/2011 1624   LYMPHSABS 2.2 04/07/2017 1145   MONOABS 0.4 04/07/2017 1145   EOSABS 0.1 04/07/2017 1145   BASOSABS 0.0 04/07/2017 1145    No results found for: POCLITH, LITHIUM   No results found for: PHENYTOIN, PHENOBARB, VALPROATE, CBMZ   .res Assessment: Plan:   Patient seen for 30 minutes and greater than 50% of visit spent counseling patient regarding plan of care.  Agree that time of increased stress and transition of move would likely not be a good time to further decrease benzodiazepines.  Will continue current medications and reevaluate potential dose reductions in the future.  Will continue Latuda 60 mg daily for mood signs and symptoms.  Patient does not need a refill of Latuda at this time.  Will continue all other medications as indicated below.  Patient to follow-up in 2 to 3 months or sooner if clinically indicated.    PTSD (post-traumatic stress disorder) - Plan: clonazePAM (KLONOPIN)  0.5 MG tablet, chlordiazePOXIDE (LIBRIUM) 10 MG capsule  Mild mood disorder (HCC) - Plan: oxcarbazepine (TRILEPTAL) 600 MG tablet  Primary insomnia  Please see After Visit Summary for patient specific instructions.  Future Appointments  Date Time Provider Department Center  05/03/2018  1:00 PM Robley Fries, PhD [facility] None  06/04/2018 10:00 AM Corie Chiquito, PMHNP [facility] None  06/04/2018  1:00 PM Mitchum, Molly Maduro, PhD [facility] None    No orders of the defined types were placed in this encounter.     -------------------------------

## 2018-05-03 ENCOUNTER — Ambulatory Visit (INDEPENDENT_AMBULATORY_CARE_PROVIDER_SITE_OTHER): Payer: BLUE CROSS/BLUE SHIELD | Admitting: Psychiatry

## 2018-05-03 DIAGNOSIS — Z8659 Personal history of other mental and behavioral disorders: Secondary | ICD-10-CM | POA: Diagnosis not present

## 2018-05-03 DIAGNOSIS — F411 Generalized anxiety disorder: Secondary | ICD-10-CM | POA: Diagnosis not present

## 2018-05-03 DIAGNOSIS — F3340 Major depressive disorder, recurrent, in remission, unspecified: Secondary | ICD-10-CM

## 2018-05-03 DIAGNOSIS — K3184 Gastroparesis: Secondary | ICD-10-CM

## 2018-05-03 NOTE — Progress Notes (Signed)
[facility] Counselor/Therapist Progress Note   Patient ID: ERNESTA TRABERT, MRN: [id number]  Date: 05/03/18 Start time: 1:10p Stop time: 2:12p Time Spent: 62 min  Treatment Type: Individual  Subjective: Moved out of parents' house 10/19, but feeling very alone, isolated in [place].  Noise level (literal and metaphorical) has not come down, as father calls 3-4 times last week, desperate for not knowing what to do with her mother's emotional breakdowns and frequent, prolonged crying spells and catastrophizing.  Father seems lost, panicking how to help her, says he "can't take it", desperate for guidance or rescue from PT.  PT stayed on phone an hour and 45 min with motherone day just so F could go out shopping without being begged to stay by M.  Other 45-min calls, attempting to change the subject, draw M out of her hypnotically anxious and depressed state.  Has urged her to contact her good friend Lorene Dy without success, but note that M very recently has, following her own therapy session here, where it was also directed she do so.  Since that session, M has also engaged for assessment of higher level of care, asking about IOP and being accepted for interview today for [facility].  Hope of improvement with intensive day treatment, and testimonial from PT how it helped her when she was actively anorexic.    Stomach worse off with gastroparesis sxs last 4 days, seems to be activated by emotional distress.  Easily frustrated, irritable outbursts with H, who is encouraging but tries too hard to problem-solve rather than empathize.  Tried to reach out to parents for support herself, but M flipped the conversation, monologuing about a painful visit with PT's psychiatrically disabled brother.  Donalee Citrin is also anxiety-provoking for being closer to [facility] family, who have, as she suspected, been much more interactive now that they know the couple live nearby.  State they have pestered the couple  to hang out together, in ways that were formerly stifling to PT and H both.  Pressure to get reinvolved [place], clannish interactions, and emotionally stifling family dynamics.  Sees H losing the assertiveness he had once developed, hears herself becoming sharper with him as she sees this.  Interventions: Cognitive Behavioral Therapy, Assertiveness/Communication, Solution-Oriented/Positive Psychology, Gestalt/Psychodrama and Ego-Supportive Interpreted the cascade of many stresses, validated frustration, affirmed therapeutic actions despite experienced stress.  Outlined best practices in psychological first aid to father and mother ("validate and circulate"), reoriented to notice other help as it arrives.  Refreshed perspective for being assertive rather than too wary with inlaws and for H to refresh his own therapy learnings as well.  Brought attention to muscle tension in session, prompting release and relaxation practice in situ.  Discussed video chat as an option for contact with distant friends, renewed use of devotions for spiritual support, and reaching out a able to people who could become better friends, both in neighborhood and church of choice.  Mental Status Exam:    Appearance:   Casual     Behavior:  Appropriate and Sharing  Motor:  Normal  Speech/Language:   Clear and Coherent and rising volume with distress  Affect:  Appropriate and to topic, controlled irritation  Mood:  anxious, depressed and irritable  Thought process:  normal and mild pressure  Thought content:    WNL  Sensory/Perceptual disturbances:    WNL  Orientation:  WNL  Attention:  Fair  Concentration:  Good  Memory:  WNL  Fund of knowledge:   Good  Insight:    Fair  Judgment:   Good  Impulse Control:  Good     Risk Assessment: Danger to Self:  No Self-injurious Behavior: No Danger to Others: No Duty to Warn:no Physical Aggression / Violence:No  Access to Firearms a concern: No   Gang Involvement:No   Diagnosis:   ICD-10-CM   1. Generalized anxiety disorder F41.1   2. Recurrent major depressive disorder, in remission (HCC) F33.40   3. Nondiabetic gastroparesis K31.84   4. History of anorexia nervosa Z86.59    Plan:  Marland Kitchen Apply in situ relaxation practices as able . Apply advice communicating and supporting M and F, esp. Using questions more than directions and concentrating on "validate and circulate" strategy with M's hypnotic emotional states Consider asking H supportively if he is up to negotiating renewed boundary pressures with his family Continue to utilize previously learned skills ad lib Maintain medication, if prescribed, and work faithfully with relevant prescriber(s) Call the clinic on-call service, present to ER, or call 911 if any life-threatening emergency . Follow up with me in about 2 weeks    Robley Fries, PhD

## 2018-05-09 ENCOUNTER — Telehealth: Payer: Self-pay | Admitting: Psychiatry

## 2018-05-09 DIAGNOSIS — F4310 Post-traumatic stress disorder, unspecified: Secondary | ICD-10-CM

## 2018-05-09 DIAGNOSIS — F411 Generalized anxiety disorder: Secondary | ICD-10-CM

## 2018-05-09 NOTE — Telephone Encounter (Signed)
Peggy Ruiz called to report that her anxiety is very high.  Pulse is racing.  She has a lot going on in her life.  Has appt. 12/9 but wants to know if you would change her meds now to help until she gets in for appt.  Please call

## 2018-05-10 MED ORDER — PROPRANOLOL HCL 10 MG PO TABS: ORAL_TABLET | ORAL | 1 refills | Status: DC

## 2018-05-10 NOTE — Telephone Encounter (Signed)
She says gabapentin does not help her anxiety. She said she has not tried propranolol or clonidine. Explained we use those for anxiety even they are for heart rate. She's willing to try what you recommend, her pulse does elevate quit a bit with anxiety. She said her blood pressure always runs on the low end, FYI 90/60's   Uses [facility] [address] [place] [address], [place]

## 2018-05-10 NOTE — Telephone Encounter (Signed)
Given information and verbalized understanding.  

## 2018-05-10 NOTE — Telephone Encounter (Signed)
Please let patient know that propanolol was sent to pharmacy.  Prescription written for 1/2 to 1 tablet twice daily as needed.  Please let her know not to take if pulse is 65 or less and to stop taking propanolol if it causes her to feel dizzy or lightheaded.

## 2018-05-21 ENCOUNTER — Encounter: Payer: Self-pay | Admitting: Emergency Medicine

## 2018-05-21 DIAGNOSIS — G4700 Insomnia, unspecified: Secondary | ICD-10-CM

## 2018-05-22 IMAGING — US US EXTREM  UP VENOUS*R*
1 series · 13 of 24 positions shown · non-contrast
Comparison: None.

CLINICAL DATA: Acute onset of right arm pain and swelling. Initial
encounter.



[Series 1: us extrem up venous*right* · 0.08mm/px · 13 of 34 slices shown]
[im 1/34]
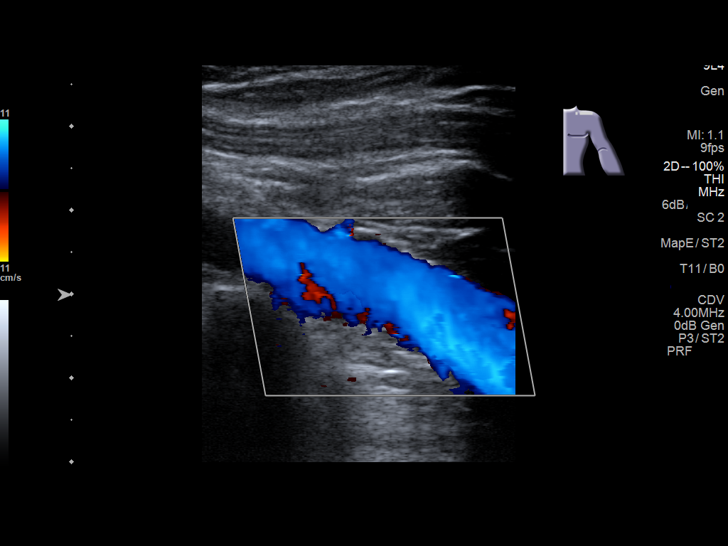
[im 3/34]
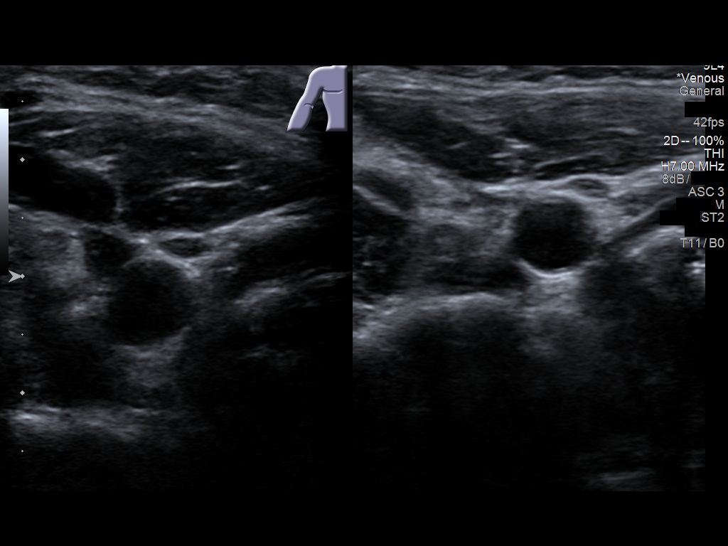
[im 6/34]
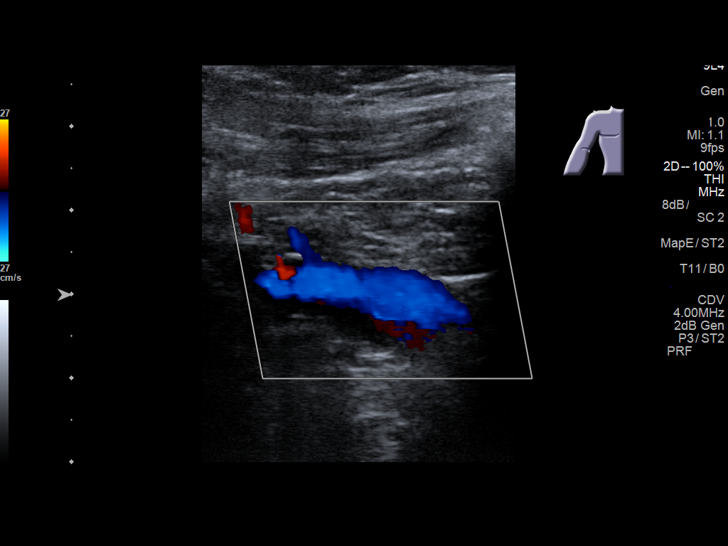
[im 9/34]
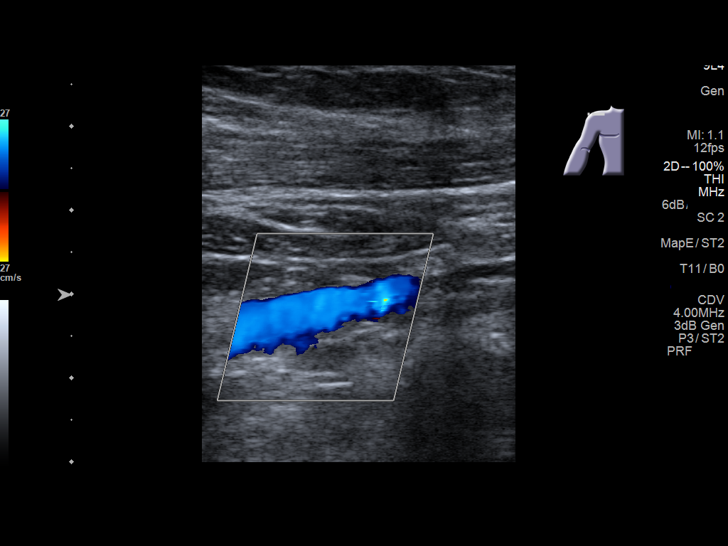
[im 12/34]
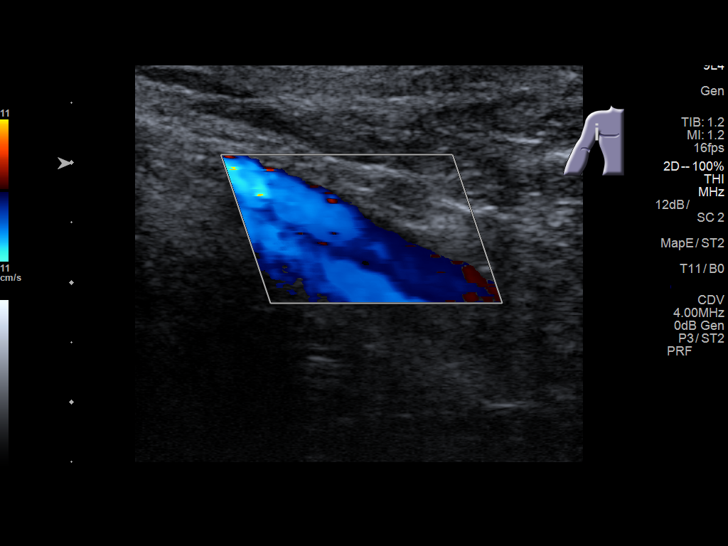
[im 15/34]
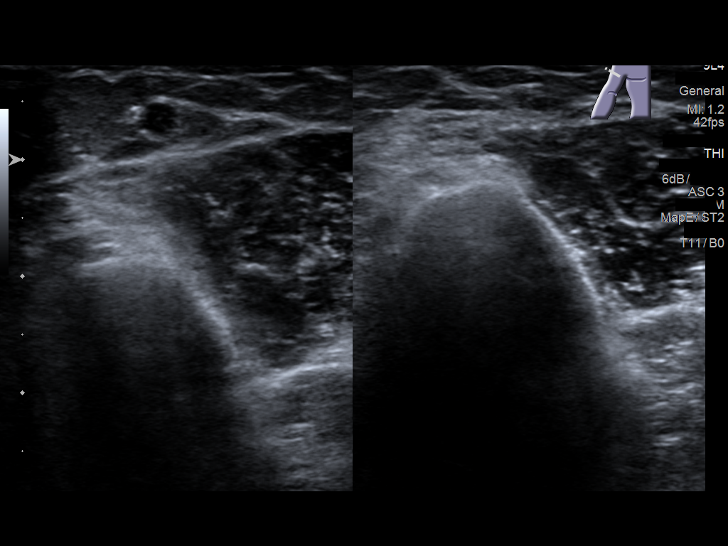
[im 18/34]
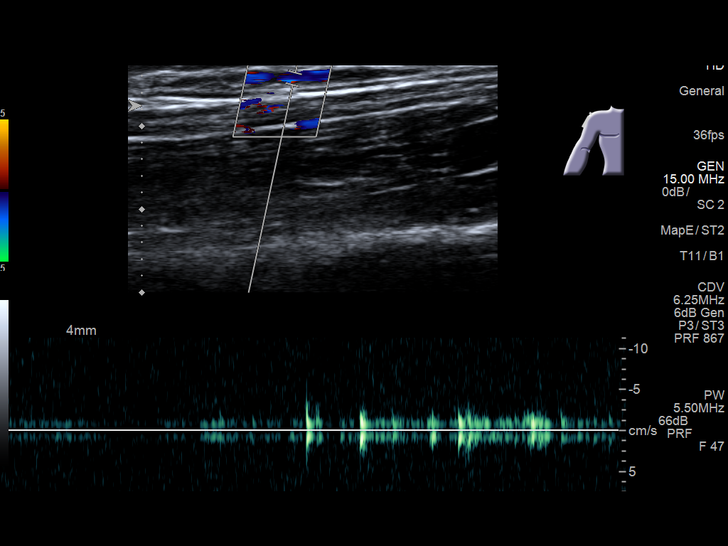
[im 19/34]
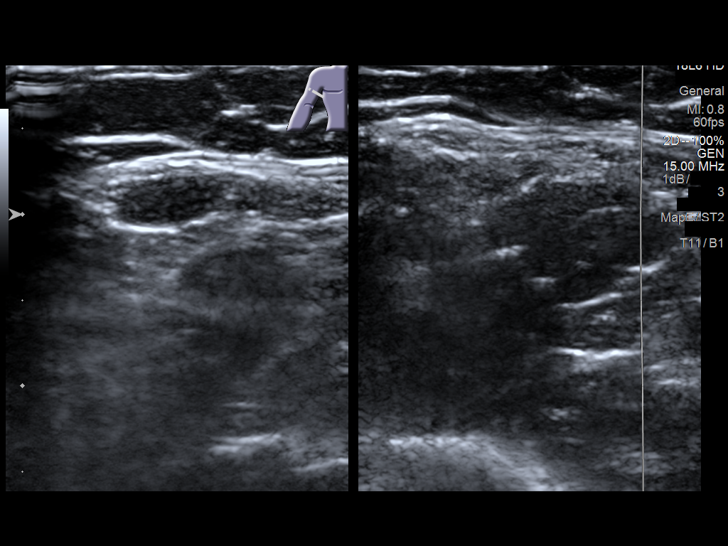
[im 22/34]
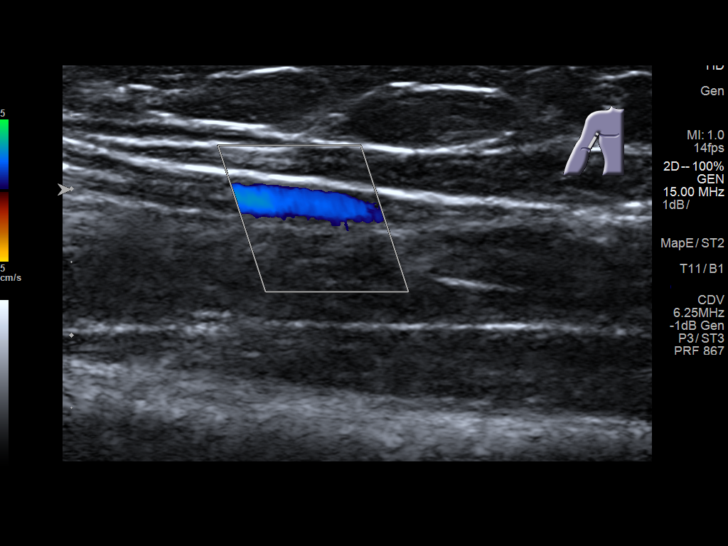
[im 25/34]
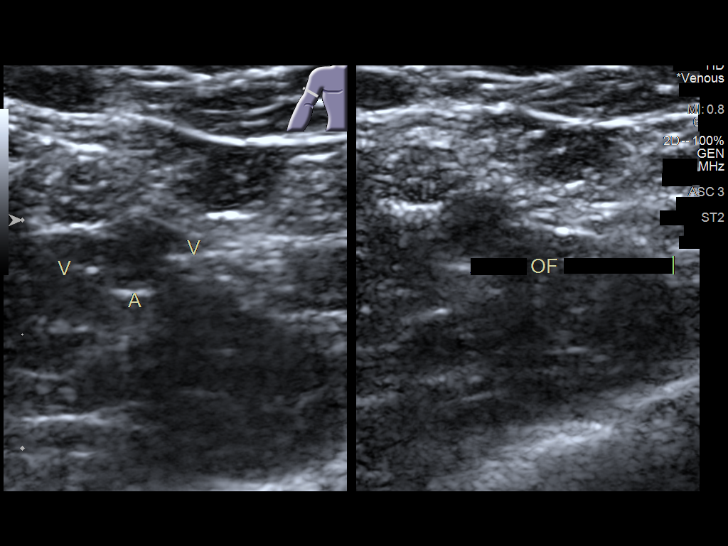
[im 28/34]
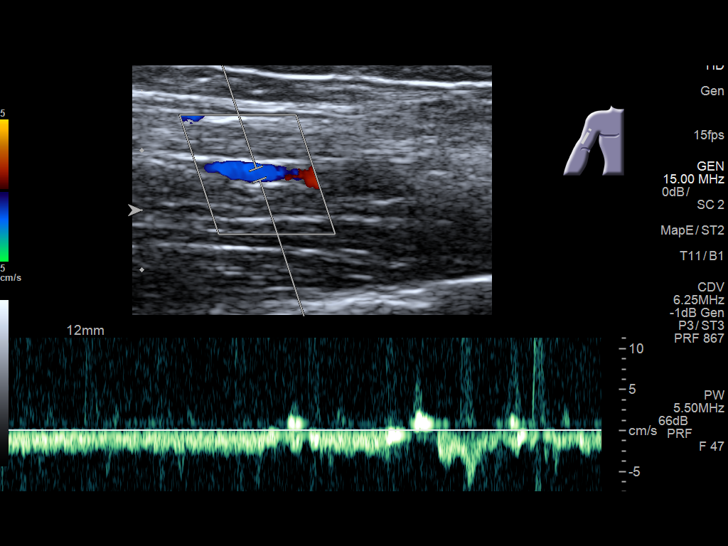
[im 31/34]
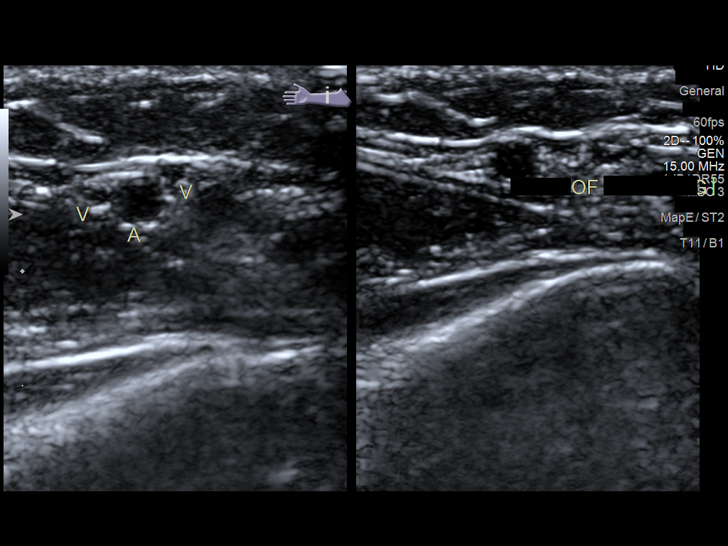
[im 34/34]
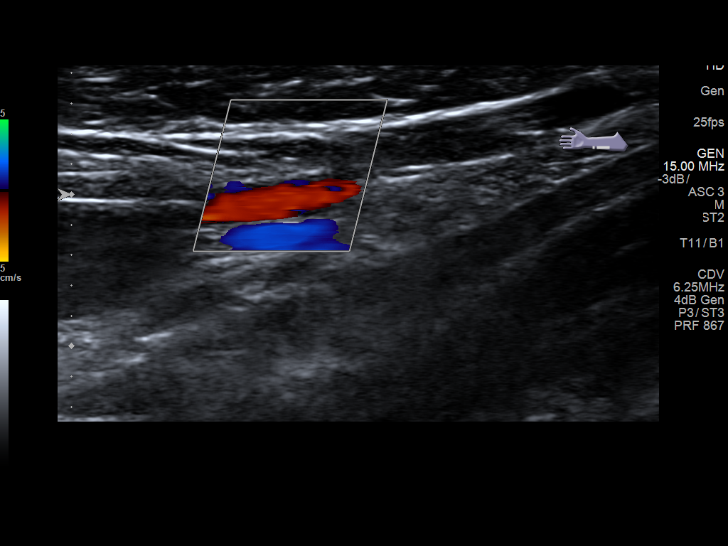

[13 of 24 positions shown; findings below may reference images not displayed]

FINDINGS: Contralateral Subclavian Vein: Respiratory phasicity is normal and
symmetric with the symptomatic side. No evidence of thrombus. Normal
compressibility.

Internal Jugular Vein: No evidence of thrombus. Normal
compressibility, respiratory phasicity and response to augmentation.

Subclavian Vein: No evidence of thrombus. Normal compressibility,
respiratory phasicity and response to augmentation.

Axillary Vein: No evidence of thrombus. Normal compressibility,
respiratory phasicity and response to augmentation.

Cephalic Vein: No evidence of thrombus. Normal compressibility,
respiratory phasicity and response to augmentation.

Basilic Vein: No evidence of thrombus. Normal compressibility,
respiratory phasicity and response to augmentation.

Brachial Veins: No evidence of thrombus. Normal compressibility,
respiratory phasicity and response to augmentation.

Radial Veins: No evidence of thrombus. Normal compressibility,
respiratory phasicity and response to augmentation.

Ulnar Veins: No evidence of thrombus. Normal compressibility,
respiratory phasicity and response to augmentation.

Venous Reflux:  None visualized.

Other Findings:  None visualized.
IMPRESSION: No evidence of DVT within the right upper extremity.

## 2018-06-04 ENCOUNTER — Encounter: Payer: Self-pay | Admitting: Psychiatry

## 2018-06-04 ENCOUNTER — Ambulatory Visit (INDEPENDENT_AMBULATORY_CARE_PROVIDER_SITE_OTHER): Payer: BLUE CROSS/BLUE SHIELD | Admitting: Psychiatry

## 2018-06-04 VITALS — BP 103/70 | HR 90

## 2018-06-04 DIAGNOSIS — F4310 Post-traumatic stress disorder, unspecified: Secondary | ICD-10-CM

## 2018-06-04 DIAGNOSIS — K3184 Gastroparesis: Secondary | ICD-10-CM

## 2018-06-04 DIAGNOSIS — R6889 Other general symptoms and signs: Secondary | ICD-10-CM

## 2018-06-04 DIAGNOSIS — Z8659 Personal history of other mental and behavioral disorders: Secondary | ICD-10-CM

## 2018-06-04 DIAGNOSIS — F411 Generalized anxiety disorder: Secondary | ICD-10-CM

## 2018-06-04 DIAGNOSIS — F39 Unspecified mood [affective] disorder: Secondary | ICD-10-CM

## 2018-06-04 DIAGNOSIS — F3340 Major depressive disorder, recurrent, in remission, unspecified: Secondary | ICD-10-CM

## 2018-06-04 MED ORDER — LURASIDONE HCL 20 MG PO TABS: ORAL_TABLET | ORAL | 0 refills | Status: DC

## 2018-06-04 NOTE — Progress Notes (Signed)
Peggy Ruiz [id number] [birth year] 32 y.o.  Subjective:   Patient ID:  Peggy Ruiz is a 32 y.o. (DOB 26-Aug-1985) female.  Chief Complaint:  Chief Complaint  Patient presents with  . Anxiety  . Follow-up    h/o depression and insomnia    HPI Peggy Ruiz presents to the office today for follow-up of mood, anxiety, and insomnia. She reports that she has been having severe anxiety with frequent, daily anxiety attacks. She reports that Propranolol helps for a few hours and then anxiety is severe again once Propranolol wears off. She reports frequent crying and states it is not because of depression but because of difficulty maintaining their home. "We've always had help with chores" and reports that she is overwhelmed with chores and household tasks and "upset my body can't do it." Reports that deconditioning interferes with her ability to complete tasks. She reports that she is home alone frequently and some days husband is away for 13 hours at work. She reports that her mood and anxiety is better with distractions, such as talking to someone on the phone or spending time with her husband. Reports at times her anxiety manifests as "anger and yelling." Reports feelings of frustration. Denies depressed mood. Reports adequate sleep.  Reports "when I am not completely anxious and overwhelmed, I am completely exhausted." Reports low energy. Reports that her motivation is adequate and is interested in doing things. Appetite has been the same with less appetite when anxiety is high. Concentration has been impaired when anxiety is high. Denies any recent manic s/s. Denies SI.    Review of Systems:  Review of Systems  HENT:       Recent sinus infection  Gastrointestinal: Positive for constipation and nausea.       Nausea with migraines  Musculoskeletal: Negative for gait problem.  Neurological: Positive for headaches. Negative for tremors.  Psychiatric/Behavioral:       Please refer to HPI     Medications: I have reviewed the patient's current medications.  Current Outpatient Medications  Medication Sig Dispense Refill  . chlordiazePOXIDE (LIBRIUM) 10 MG capsule Take 1-2 capsules (10-20 mg total) by mouth See admin instructions. Take 10mg  in the morning, and 20mg  at bedtime 90 capsule 2  . [START ON 06/07/2018] clonazePAM (KLONOPIN) 0.5 MG tablet Take 1 tablet (0.5 mg total) by mouth every morning. Take 1/2-1 po BID 60 tablet 2  . dexlansoprazole (DEXILANT) 60 MG capsule Take 60 mg by mouth every morning.    Dorise Hiss 70 MG/ML SOAJ Inject 140 mg into the skin every 30 (thirty) days.     . fluticasone (FLONASE) 50 MCG/ACT nasal spray Place 2 sprays into both nostrils daily.    Marland Kitchen gabapentin (NEURONTIN) 800 MG tablet Take 800 mg by mouth 2 (two) times daily.    Marland Kitchen levothyroxine (SYNTHROID, LEVOTHROID) 50 MCG tablet Take 50 mcg by mouth daily.    . Lurasidone HCl (LATUDA) 60 MG TABS Take 60 mg by mouth daily.    . magnesium 30 MG tablet Take 30 mg by mouth 2 (two) times daily.    . methocarbamol (ROBAXIN) 750 MG tablet Take 750 mg by mouth every 8 (eight) hours.    . Multiple Vitamin (MULTIVITAMIN) tablet Take 1 tablet by mouth daily.    . nortriptyline (PAMELOR) 10 MG capsule Take 10 mg by mouth at bedtime.    . ondansetron (ZOFRAN-ODT) 8 MG disintegrating tablet Take 8 mg by mouth every 8 (eight) hours as needed for  nausea.    Marland Kitchen. oxcarbazepine (TRILEPTAL) 600 MG tablet Take 1 tablet (600 mg total) by mouth at bedtime. 30 tablet 5  . Probiotic Product (PROBIOTIC DAILY PO) Take by mouth.    . propranolol (INDERAL) 10 MG tablet Take 1/2-1 tab po BID prn anxiety 60 tablet 1  . levonorgestrel-ethinyl estradiol (SEASONALE,INTROVALE,JOLESSA) 0.15-0.03 MG tablet Take 1 tablet by mouth daily.    Marland Kitchen. lurasidone (LATUDA) 20 MG TABS tablet Take with 60 mg tablet to equal total daily dose of 80 mg. Samples of Latuda 20 mg provided 21 tablet 0   No current facility-administered medications  for this visit.     Medication Side Effects: None  Allergies:  Allergies  Allergen Reactions  . Promethazine     Other reaction(s): Agitation  . Ciprofloxacin Other (See Comments)    c-diff  . Dilaudid [Hydromorphone]     sob  . Penicillins Hives    Has patient had a PCN reaction causing immediate rash, facial/tongue/throat swelling, SOB or lightheadedness with hypotension: Yes Has patient had a PCN reaction causing severe rash involving mucus membranes or skin necrosis: No Has patient had a PCN reaction that required hospitalization: No Has patient had a PCN reaction occurring within the last 10 years: Yes If all of the above answers are "NO", then may proceed with Cephalosporin use.   . Toradol [Ketorolac Tromethamine] Nausea And Vomiting    Past Medical History:  Diagnosis Date  . Anorexia    at 15  . Anxiety   . Attention deficit disorder (ADD)   . Bone spur   . Depression   . HPV (human papilloma virus) infection     Family History  Problem Relation Age of Onset  . Fibromyalgia Mother   . Anxiety disorder Mother   . Depression Mother   . Depression Father   . Anxiety disorder Father     Social History   Socioeconomic History  . Marital status: Single    Spouse name: Not on file  . Number of children: Not on file  . Years of education: Not on file  . Highest education level: Not on file  Occupational History  . Not on file  Social Needs  . Financial resource strain: Not on file  . Food insecurity:    Worry: Not on file    Inability: Not on file  . Transportation needs:    Medical: Not on file    Non-medical: Not on file  Tobacco Use  . Smoking status: Never Smoker  . Smokeless tobacco: Never Used  Substance and Sexual Activity  . Alcohol use: Yes    Comment: occasion  . Drug use: No  . Sexual activity: Never  Lifestyle  . Physical activity:    Days per week: Not on file    Minutes per session: Not on file  . Stress: Not on file   Relationships  . Social connections:    Talks on phone: Not on file    Gets together: Not on file    Attends religious service: Not on file    Active member of club or organization: Not on file    Attends meetings of clubs or organizations: Not on file    Relationship status: Not on file  . Intimate partner violence:    Fear of current or ex partner: Not on file    Emotionally abused: Not on file    Physically abused: Not on file    Forced sexual activity: Not on file  Other Topics Concern  . Not on file  Social History Narrative   Regular exercise: none   Caffeine use: daily    Past Medical History, Surgical history, Social history, and Family history were reviewed and updated as appropriate.   Please see review of systems for further details on the patient's review from today.   Objective:   Physical Exam:  BP 103/70   Pulse 90   Physical Exam  Constitutional: She is oriented to person, place, and time. She appears well-developed. No distress.  Musculoskeletal: She exhibits no deformity.  Neurological: She is alert and oriented to person, place, and time. Coordination normal.  Psychiatric: Her behavior is normal. Judgment and thought content normal. Her mood appears anxious. Her affect is not angry, not blunt, not labile and not inappropriate. Cognition and memory are normal. She does not exhibit a depressed mood. She expresses no homicidal and no suicidal ideation. She expresses no suicidal plans and no homicidal plans.  Insight intact. No auditory or visual hallucinations. No delusions.  Speech: Increased volume and quantity.    Lab Review:     Component Value Date/Time   NA 139 06/26/2017 0025   NA 138 08/31/2011 1624   K 3.7 06/26/2017 0025   K 4.1 08/31/2011 1624   CL 105 06/26/2017 0025   CL 102 08/31/2011 1624   CO2 25 06/26/2017 0025   CO2 27 08/31/2011 1624   GLUCOSE 110 (H) 06/26/2017 0025   GLUCOSE 81 08/31/2011 1624   BUN 9 06/26/2017 0025   BUN  10 08/31/2011 1624   CREATININE 0.88 06/26/2017 0025   CREATININE 0.88 08/31/2011 1624   CALCIUM 9.1 06/26/2017 0025   CALCIUM 9.1 08/31/2011 1624   PROT 7.4 06/26/2017 0025   PROT 7.8 08/31/2011 1624   ALBUMIN 4.5 06/26/2017 0025   ALBUMIN 4.0 08/31/2011 1624   AST 19 06/26/2017 0025   AST 25 08/31/2011 1624   ALT 14 06/26/2017 0025   ALT 24 08/31/2011 1624   ALKPHOS 49 06/26/2017 0025   ALKPHOS 52 08/31/2011 1624   BILITOT 0.5 06/26/2017 0025   BILITOT 0.3 08/31/2011 1624   GFRNONAA >60 06/26/2017 0025   GFRNONAA >60 08/31/2011 1624   GFRAA >60 06/26/2017 0025   GFRAA >60 08/31/2011 1624       Component Value Date/Time   WBC 6.4 06/26/2017 0025   RBC 3.85 06/26/2017 0025   HGB 12.1 06/26/2017 0025   HGB 12.7 08/31/2011 1624   HCT 34.8 (L) 06/26/2017 0025   HCT 37.7 08/31/2011 1624   PLT 246 06/26/2017 0025   PLT 248 08/31/2011 1624   MCV 90.3 06/26/2017 0025   MCV 90 08/31/2011 1624   MCH 31.5 06/26/2017 0025   MCHC 34.9 06/26/2017 0025   RDW 11.9 06/26/2017 0025   RDW 12.6 08/31/2011 1624   LYMPHSABS 2.2 04/07/2017 1145   MONOABS 0.4 04/07/2017 1145   EOSABS 0.1 04/07/2017 1145   BASOSABS 0.0 04/07/2017 1145    No results found for: POCLITH, LITHIUM   No results found for: PHENYTOIN, PHENOBARB, VALPROATE, CBMZ   .res Assessment: Plan:   Discussed potential benefits, risk, and side effects of increasing Latuda to 80 mg daily to improve irritability and anxiety.  Discussed that higher doses of Latuda typically have a calming effect and are more effective for mood stabilization since she reports some recent mild mood lability.  Patient provided with samples of Latuda 20 mg daily to take in combination with prescription of Latuda 60 mg daily that  she had filled recently to equal total daily dose of 80 mg.  Patient advised to contact office if 80 mg is more effective and prescription can be sent to her pharmacy.  Also discussed taking propanolol more than once daily  as needed.  Agree with her plan to work with therapist to increase coping strategies to control anxiety.   Continue Klonopin and Librium for anxiety Continue gabapentin for anxiety.  Continue Trileptal for anxiety, mood, and insomnia. Continue propanolol as needed for anxiety. Generalized anxiety disorder - Chronic with acute exacerbation  Mild mood disorder (HCC) - Chronic with some recent worsening in irritability.  PTSD (post-traumatic stress disorder) - Chronic  Please see After Visit Summary for patient specific instructions.  Future Appointments  Date Time Provider Department Center  06/04/2018  1:00 PM Robley Fries, PhD [facility] None  07/03/2018  1:15 PM Corie Chiquito, PMHNP [facility] None    No orders of the defined types were placed in this encounter.     -------------------------------
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
## 2018-06-18 ENCOUNTER — Other Ambulatory Visit: Payer: Self-pay

## 2018-06-18 MED ORDER — LURASIDONE HCL 80 MG PO TABS: 80.0000 mg | ORAL_TABLET | Freq: Every day | ORAL | 1 refills | Status: DC

## 2018-07-03 ENCOUNTER — Encounter: Payer: Self-pay | Admitting: Psychiatry

## 2018-07-03 ENCOUNTER — Ambulatory Visit (INDEPENDENT_AMBULATORY_CARE_PROVIDER_SITE_OTHER): Payer: PRIVATE HEALTH INSURANCE | Admitting: Psychiatry

## 2018-07-03 VITALS — BP 103/74 | HR 84 | Wt 148.0 lb

## 2018-07-03 DIAGNOSIS — F39 Unspecified mood [affective] disorder: Secondary | ICD-10-CM | POA: Diagnosis not present

## 2018-07-03 DIAGNOSIS — F411 Generalized anxiety disorder: Secondary | ICD-10-CM

## 2018-07-03 DIAGNOSIS — G4700 Insomnia, unspecified: Secondary | ICD-10-CM | POA: Diagnosis not present

## 2018-07-03 DIAGNOSIS — F4310 Post-traumatic stress disorder, unspecified: Secondary | ICD-10-CM | POA: Diagnosis not present

## 2018-07-03 MED ORDER — CLONIDINE HCL 0.1 MG PO TABS: ORAL_TABLET | ORAL | 2 refills | Status: DC

## 2018-07-03 MED ORDER — CHLORDIAZEPOXIDE HCL 10 MG PO CAPS: ORAL_CAPSULE | ORAL | 2 refills | Status: DC

## 2018-07-03 NOTE — Progress Notes (Signed)
Peggy Ruiz [id number] 09/20/1985 33 y.o.  Subjective:   Patient ID:  Peggy ShadowLeah C Coiner is a 33 y.o. (DOB 05/09/1986) female.  Chief Complaint:  Chief Complaint  Patient presents with  . Anxiety  . Follow-up    h/o depression, insomnia    HPI Peggy ShadowLeah C Ruiz presents to the office today for follow-up of anxiety, depression, and insomnia. She reports that she continues to have severe anxiety. She reports that her sleep is significantly improved with Latuda 80 mg po QHS and is able to sleep through the night. She reports that she has not noticed any improvement in her anxiety or racing thoughts with higher dose of Latuda. Reports that she often has chest tightness with anxiety. Reports having panic s/s before anniversary dinner. She reports that anxiety continues to worsen and feels that she may need to work through some things, however she reports that cost is a barrier to regular therapy sessions. Reports anxiety seems to be worse since she has moved away from her parents. She reports that propranolol 10 mg does not relieve chest pain and 20 mg causes her to feel dizzy. She reports that her anxiety is worse in the evenings. Reports that she typically feels the best in the early afternoon. She reports that she is frequently crying and notices that her tone of voice changes. She reports that others perceive her as irritable when she is having higher levels of anxiety. Reports "I'm feeling down. I wouldn't say depressed." Reports feelings of loneliness. Reports that she had difficulty enjoying the holidays due to anxiety. She reports low energy despite improved sleep. Recent flare of gastroparesis and has had decreased appetite. Denies SI.   Husband will [place] working in [place] April-June and again [employer].   Past Psychiatric Medication Trials: Cymbalta Prozac Lexapro Paxil Effexor Nortriptyline-prescribed by GI for gastroparesis Remeron-caused insomnia Abilify-involuntary  movements Zyprexa-effective for a period of time.  Caused excessive weight gain at times. Seroquel Latuda-effective Lamictal Gabapentin Trileptal Belsomra-helpful with sleep quality but not sleep quantity. Klonopin Ativan Librium Trazodone Ambien Nuvigil Adderall  Review of Systems:  Review of Systems  Gastrointestinal: Positive for abdominal pain and nausea.  Musculoskeletal: Negative for gait problem.  Neurological: Negative for tremors.  Psychiatric/Behavioral:       Please refer to HPI    Medications: I have reviewed the patient's current medications.  Current Outpatient Medications  Medication Sig Dispense Refill  . [START ON 07/29/2018] chlordiazePOXIDE (LIBRIUM) 10 MG capsule Take 1 po q am and 2 po QHS 90 capsule 2  . clonazePAM (KLONOPIN) 0.5 MG tablet Take 1 tablet (0.5 mg total) by mouth every morning. Take 1/2-1 po BID 60 tablet 2  . cloNIDine (CATAPRES) 0.1 MG tablet Take 1 tab po QHS x 1 week, then may increase to BID as tolerated. 60 tablet 2  . dexlansoprazole (DEXILANT) 60 MG capsule Take 60 mg by mouth every morning.    Dorise Hiss. Erenumab-aooe 70 MG/ML SOAJ Inject 140 mg into the skin every 30 (thirty) days.     . fluticasone (FLONASE) 50 MCG/ACT nasal spray Place 2 sprays into both nostrils daily.    Marland Kitchen. gabapentin (NEURONTIN) 800 MG tablet Take 800 mg by mouth 2 (two) times daily.    Marland Kitchen. levonorgestrel-ethinyl estradiol (SEASONALE,INTROVALE,JOLESSA) 0.15-0.03 MG tablet Take 1 tablet by mouth daily.    Marland Kitchen. levothyroxine (SYNTHROID, LEVOTHROID) 50 MCG tablet Take 50 mcg by mouth daily.    Marland Kitchen. lurasidone (LATUDA) 80 MG TABS tablet Take 1 tablet (80 mg total)  by mouth daily with breakfast. 30 tablet 1  . magnesium 30 MG tablet Take 30 mg by mouth 2 (two) times daily.    . methocarbamol (ROBAXIN) 750 MG tablet Take 750 mg by mouth every 8 (eight) hours.    . Multiple Vitamin (MULTIVITAMIN) tablet Take 1 tablet by mouth daily.    . nortriptyline (PAMELOR) 10 MG capsule Take 10  mg by mouth at bedtime.    . ondansetron (ZOFRAN-ODT) 8 MG disintegrating tablet Take 8 mg by mouth every 8 (eight) hours as needed for nausea.    Marland Kitchen oxcarbazepine (TRILEPTAL) 600 MG tablet Take 1 tablet (600 mg total) by mouth at bedtime. 30 tablet 5  . Probiotic Product (PROBIOTIC DAILY PO) Take by mouth.     No current facility-administered medications for this visit.     Medication Side Effects: None  Allergies:  Allergies  Allergen Reactions  . Promethazine     Other reaction(s): Agitation  . Ciprofloxacin Other (See Comments)    c-diff  . Dilaudid [Hydromorphone]     sob  . Penicillins Hives    Has patient had a PCN reaction causing immediate rash, facial/tongue/throat swelling, SOB or lightheadedness with hypotension: Yes Has patient had a PCN reaction causing severe rash involving mucus membranes or skin necrosis: No Has patient had a PCN reaction that required hospitalization: No Has patient had a PCN reaction occurring within the last 10 years: Yes If all of the above answers are "NO", then may proceed with Cephalosporin use.   . Toradol [Ketorolac Tromethamine] Nausea And Vomiting    Past Medical History:  Diagnosis Date  . Anorexia    at 15  . Anxiety   . Attention deficit disorder (ADD)   . Bone spur   . Depression   . HPV (human papilloma virus) infection     Family History  Problem Relation Age of Onset  . Fibromyalgia Mother   . Anxiety disorder Mother   . Depression Mother   . Depression Father   . Anxiety disorder Father     Social History   Socioeconomic History  . Marital status: Single    Spouse name: Not on file  . Number of children: Not on file  . Years of education: Not on file  . Highest education level: Not on file  Occupational History  . Not on file  Social Needs  . Financial resource strain: Not on file  . Food insecurity:    Worry: Not on file    Inability: Not on file  . Transportation needs:    Medical: Not on file     Non-medical: Not on file  Tobacco Use  . Smoking status: Never Smoker  . Smokeless tobacco: Never Used  Substance and Sexual Activity  . Alcohol use: Yes    Comment: occasion  . Drug use: No  . Sexual activity: Never  Lifestyle  . Physical activity:    Days per week: Not on file    Minutes per session: Not on file  . Stress: Not on file  Relationships  . Social connections:    Talks on phone: Not on file    Gets together: Not on file    Attends religious service: Not on file    Active member of club or organization: Not on file    Attends meetings of clubs or organizations: Not on file    Relationship status: Not on file  . Intimate partner violence:    Fear of current or ex  partner: Not on file    Emotionally abused: Not on file    Physically abused: Not on file    Forced sexual activity: Not on file  Other Topics Concern  . Not on file  Social History Narrative   Regular exercise: none   Caffeine use: daily    Past Medical History, Surgical history, Social history, and Family history were reviewed and updated as appropriate.   Please see review of systems for further details on the patient's review from today.   Objective:   Physical Exam:  BP 103/74   Pulse 84   Wt 148 lb (67.1 kg)   BMI 21.86 kg/m   Physical Exam Constitutional:      General: She is not in acute distress.    Appearance: She is well-developed.  Musculoskeletal:        General: No deformity.  Neurological:     Mental Status: She is alert and oriented to person, place, and time.     Coordination: Coordination normal.  Psychiatric:        Mood and Affect: Mood is anxious. Mood is not depressed. Affect is not labile, blunt, angry or inappropriate.        Speech: Speech normal.        Behavior: Behavior normal.        Thought Content: Thought content normal. Thought content does not include homicidal or suicidal ideation. Thought content does not include homicidal or suicidal plan.         Judgment: Judgment normal.     Comments: Insight intact. No auditory or visual hallucinations. No delusions.      Lab Review:     Component Value Date/Time   NA 139 06/26/2017 0025   NA 138 08/31/2011 1624   K 3.7 06/26/2017 0025   K 4.1 08/31/2011 1624   CL 105 06/26/2017 0025   CL 102 08/31/2011 1624   CO2 25 06/26/2017 0025   CO2 27 08/31/2011 1624   GLUCOSE 110 (H) 06/26/2017 0025   GLUCOSE 81 08/31/2011 1624   BUN 9 06/26/2017 0025   BUN 10 08/31/2011 1624   CREATININE 0.88 06/26/2017 0025   CREATININE 0.88 08/31/2011 1624   CALCIUM 9.1 06/26/2017 0025   CALCIUM 9.1 08/31/2011 1624   PROT 7.4 06/26/2017 0025   PROT 7.8 08/31/2011 1624   ALBUMIN 4.5 06/26/2017 0025   ALBUMIN 4.0 08/31/2011 1624   AST 19 06/26/2017 0025   AST 25 08/31/2011 1624   ALT 14 06/26/2017 0025   ALT 24 08/31/2011 1624   ALKPHOS 49 06/26/2017 0025   ALKPHOS 52 08/31/2011 1624   BILITOT 0.5 06/26/2017 0025   BILITOT 0.3 08/31/2011 1624   GFRNONAA >60 06/26/2017 0025   GFRNONAA >60 08/31/2011 1624   GFRAA >60 06/26/2017 0025   GFRAA >60 08/31/2011 1624       Component Value Date/Time   WBC 6.4 06/26/2017 0025   RBC 3.85 06/26/2017 0025   HGB 12.1 06/26/2017 0025   HGB 12.7 08/31/2011 1624   HCT 34.8 (L) 06/26/2017 0025   HCT 37.7 08/31/2011 1624   PLT 246 06/26/2017 0025   PLT 248 08/31/2011 1624   MCV 90.3 06/26/2017 0025   MCV 90 08/31/2011 1624   MCH 31.5 06/26/2017 0025   MCHC 34.9 06/26/2017 0025   RDW 11.9 06/26/2017 0025   RDW 12.6 08/31/2011 1624   LYMPHSABS 2.2 04/07/2017 1145   MONOABS 0.4 04/07/2017 1145   EOSABS 0.1 04/07/2017 1145   BASOSABS 0.0 04/07/2017  1145    No results found for: POCLITH, LITHIUM   No results found for: PHENYTOIN, PHENOBARB, VALPROATE, CBMZ   .res Assessment: Plan:   Will discontinue propanolol since patient reports that this is not effective at 10 mg dose and causes significant dizziness at 20 mg dose.  Will start trial of clonidine  0.1 mg at bedtime for 1 week and then may increase to 1/2 to 1 tablet p.o. twice daily as tolerated for anxiety. Discussed strategies to improve anxiety and decreased feelings of loneliness and social isolation. Continue Klonopin and Librium for anxiety Continue gabapentin for anxiety.  Continue Trileptal for anxiety, mood, and insomnia. Continue Latuda for mood and insomnia.  Generalized anxiety disorder - Chronic with acute exacerbation - Plan: cloNIDine (CATAPRES) 0.1 MG tablet, chlordiazePOXIDE (LIBRIUM) 10 MG capsule  PTSD (post-traumatic stress disorder) - Chronic with acute exacerbation - Plan: cloNIDine (CATAPRES) 0.1 MG tablet, chlordiazePOXIDE (LIBRIUM) 10 MG capsule  Mild mood disorder (HCC) - Chronic  Insomnia, unspecified type - Stable  Please see After Visit Summary for patient specific instructions.  Future Appointments  Date Time Provider Department Center  07/10/2018  2:00 PM Robley Fries, PhD [facility] None  07/31/2018  1:30 PM Corie Chiquito, PMHNP [facility] None    No orders of the defined types were placed in this encounter.     -------------------------------

## 2018-07-10 ENCOUNTER — Ambulatory Visit: Payer: BLUE CROSS/BLUE SHIELD | Admitting: Psychiatry

## 2018-07-31 ENCOUNTER — Ambulatory Visit: Payer: PRIVATE HEALTH INSURANCE | Admitting: Psychiatry

## 2018-08-10 ENCOUNTER — Other Ambulatory Visit: Payer: Self-pay | Admitting: Psychiatry

## 2018-08-14 ENCOUNTER — Ambulatory Visit (INDEPENDENT_AMBULATORY_CARE_PROVIDER_SITE_OTHER): Payer: PRIVATE HEALTH INSURANCE | Admitting: Psychiatry

## 2018-08-14 ENCOUNTER — Encounter: Payer: Self-pay | Admitting: Psychiatry

## 2018-08-14 VITALS — BP 102/68 | HR 83

## 2018-08-14 DIAGNOSIS — F39 Unspecified mood [affective] disorder: Secondary | ICD-10-CM | POA: Diagnosis not present

## 2018-08-14 DIAGNOSIS — F411 Generalized anxiety disorder: Secondary | ICD-10-CM | POA: Diagnosis not present

## 2018-08-14 DIAGNOSIS — F5101 Primary insomnia: Secondary | ICD-10-CM | POA: Diagnosis not present

## 2018-08-14 DIAGNOSIS — F4310 Post-traumatic stress disorder, unspecified: Secondary | ICD-10-CM

## 2018-08-14 MED ORDER — CLONIDINE HCL 0.1 MG PO TABS: ORAL_TABLET | ORAL | 2 refills | Status: DC

## 2018-08-14 MED ORDER — OXCARBAZEPINE 600 MG PO TABS: 600.0000 mg | ORAL_TABLET | Freq: Every day | ORAL | 5 refills | Status: DC

## 2018-08-14 MED ORDER — CLONAZEPAM 0.5 MG PO TABS: 0.5000 mg | ORAL_TABLET | ORAL | 5 refills | Status: DC

## 2018-08-14 MED ORDER — CHLORDIAZEPOXIDE HCL 10 MG PO CAPS: ORAL_CAPSULE | ORAL | 2 refills | Status: DC

## 2018-08-14 MED ORDER — LURASIDONE HCL 80 MG PO TABS: 80.0000 mg | ORAL_TABLET | Freq: Every day | ORAL | 2 refills | Status: DC

## 2018-08-14 NOTE — Progress Notes (Signed)
Peggy ShadowLeah C Pellicano [id number] 11/11/1985 33 y.o.  Subjective:   Patient ID:  Peggy Ruiz is a 33 y.o. (DOB 02/10/1986) female.  Chief Complaint:  Chief Complaint  Patient presents with  . Anxiety  . Insomnia    HPI Peggy ShadowLeah C Okey presents to the office today for follow-up of anxiety, depression, and insomnia. Reports that she is tolerating Clonidine better than Propranolol without any significant decrease in BP or pulse. Reports that she has only used Clonidine during the day on about 2 occasions and did not have dizziness. She feels that Clonidine has "helped some." reports that she has been sleeping "harder" with Latuda and continues to sleep throughout the night despite pain. Reports that she continues to have "angry outbursts" in response to stressors and that it typically lasts about an hour and then it spontaneously resolves. She reports that she will then apologize and feel remorseful afterwards. Reports that these episodes are not directed toward loved ones or interfering with her relationships. Reports that she has been having less panic s/s and has only had a few recent panic attacks, such as when she had her MRI and other severe stressors. No longer having regular chest tightness and physical s/s of anxiety. Reports some worry related to health issues "but I try not to focus on it." Denies severe depression. Appetite has been ok. Energy and motivation have been ok and occasionally the people aroudn her are telling her she is over-doing things. Concentration is adequate. Denies SI.   Reports that she started having severe neck and upper back pain in January. Reports that she was also having some pain and weakness in her arms. Reports that she had an MRI of her upper spine and it showed bone spurs, degeneration, and bulging discs, and no herniations. Reports that plan is for her to have cervical spine injection and start PT and dry needling. Reports that an epidural may also be a treatment  consideration. Reports that it will likely be a month before she can get the injection and that current pain level is interfering with her daily tasks and activities, particularly since she has increased pain with bending over and looking down. Reports gastroparesis has been well controlled.  Past Psychiatric Medication Trials: Cymbalta Prozac Lexapro Paxil Effexor Nortriptyline-prescribed by GI for gastroparesis Remeron-caused insomnia Abilify-involuntary movements Zyprexa-effective for a period of time.  Caused excessive weight gain at times. Seroquel Latuda-effective. Sleeping "harder" on 80 mg. Lamictal Gabapentin Trileptal Belsomra-helpful with sleep quality but not sleep quantity. Klonopin Ativan Librium Trazodone Ambien Nuvigil Adderall  Review of Systems:  Review of Systems  Musculoskeletal: Positive for back pain and neck pain. Negative for gait problem.  Neurological: Negative for tremors.       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  Psychiatric/Behavioral:       Please refer to HPI    Medications: I have reviewed the patient's current medications.  Current Outpatient Medications  Medication Sig Dispense Refill  . [START ON 08/26/2018] chlordiazePOXIDE (LIBRIUM) 10 MG capsule Take 1 po q am and 2 po QHS 90 capsule 2  . [START ON 09/05/2018] clonazePAM (KLONOPIN) 0.5 MG tablet Take 1 tablet (0.5 mg total) by mouth every morning. Take 1/2-1 po BID 60 tablet 5  . cloNIDine (CATAPRES) 0.1 MG tablet Take 1 tab po QHS and 1 qd prn 60 tablet 2  . dexlansoprazole (DEXILANT) 60 MG capsule Take 60 mg by mouth every morning.    .Marland Kitchen  gabapentin (NEURONTIN) 800 MG tablet Take 800 mg by mouth 2 (two) times daily.    Marland Kitchen. levothyroxine (SYNTHROID, LEVOTHROID) 50 MCG tablet Take 50 mcg by mouth daily.    Marland Kitchen. lurasidone (LATUDA) 80 MG TABS tablet Take 1 tablet (80 mg total) by mouth daily with supper for 30 days. 30 tablet 2  . magnesium 30  MG tablet Take 30 mg by mouth 2 (two) times daily.    . methocarbamol (ROBAXIN) 750 MG tablet Take 750 mg by mouth every 8 (eight) hours as needed.     . Multiple Vitamin (MULTIVITAMIN) tablet Take 1 tablet by mouth daily.    . nortriptyline (PAMELOR) 10 MG capsule Take 10 mg by mouth at bedtime.    . ondansetron (ZOFRAN-ODT) 8 MG disintegrating tablet Take 8 mg by mouth every 8 (eight) hours as needed for nausea.    Marland Kitchen. oxcarbazepine (TRILEPTAL) 600 MG tablet Take 1 tablet (600 mg total) by mouth at bedtime. 30 tablet 5  . Probiotic Product (PROBIOTIC DAILY PO) Take by mouth.    . levonorgestrel-ethinyl estradiol (SEASONALE,INTROVALE,JOLESSA) 0.15-0.03 MG tablet Take 1 tablet by mouth daily.     No current facility-administered medications for this visit.     Medication Side Effects: None  Allergies:  Allergies  Allergen Reactions  . Promethazine     Other reaction(s): Agitation  . Ciprofloxacin Other (See Comments)    c-diff  . Dilaudid [Hydromorphone]     sob  . Penicillins Hives    Has patient had a PCN reaction causing immediate rash, facial/tongue/throat swelling, SOB or lightheadedness with hypotension: Yes Has patient had a PCN reaction causing severe rash involving mucus membranes or skin necrosis: No Has patient had a PCN reaction that required hospitalization: No Has patient had a PCN reaction occurring within the last 10 years: Yes If all of the above answers are "NO", then may proceed with Cephalosporin use.   . Toradol [Ketorolac Tromethamine] Nausea And Vomiting    Past Medical History:  Diagnosis Date  . Anorexia    at 15  . Anxiety   . Attention deficit disorder (ADD)   . Bone spur   . Depression   . HPV (human papilloma virus) infection     Family History  Problem Relation Age of Onset  . Fibromyalgia Mother   . Anxiety disorder Mother   . Depression Mother   . Depression Father   . Anxiety disorder Father     Social History   Socioeconomic  History  . Marital status: Single    Spouse name: Not on file  . Number of children: Not on file  . Years of education: Not on file  . Highest education level: Not on file  Occupational History  . Not on file  Social Needs  . Financial resource strain: Not on file  . Food insecurity:    Worry: Not on file    Inability: Not on file  . Transportation needs:    Medical: Not on file    Non-medical: Not on file  Tobacco Use  . Smoking status: Never Smoker  . Smokeless tobacco: Never Used  Substance and Sexual Activity  . Alcohol use: Yes    Comment: occasion  . Drug use: No  . Sexual activity: Never  Lifestyle  . Physical activity:    Days per week: Not on file    Minutes per session: Not on file  . Stress: Not on file  Relationships  . Social connections:  Talks on phone: Not on file    Gets together: Not on file    Attends religious service: Not on file    Active member of club or organization: Not on file    Attends meetings of clubs or organizations: Not on file    Relationship status: Not on file  . Intimate partner violence:    Fear of current or ex partner: Not on file    Emotionally abused: Not on file    Physically abused: Not on file    Forced sexual activity: Not on file  Other Topics Concern  . Not on file  Social History Narrative   Regular exercise: none   Caffeine use: daily    Past Medical History, Surgical history, Social history, and Family history were reviewed and updated as appropriate.   Please see review of systems for further details on the patient's review from today.   Objective:   Physical Exam:  BP 102/68   Pulse 83   Physical Exam Constitutional:      General: She is not in acute distress.    Appearance: She is well-developed.  Musculoskeletal:        General: No deformity.  Neurological:     Mental Status: She is alert and oriented to person, place, and time.     Coordination: Coordination normal.  Psychiatric:         Attention and Perception: Attention and perception normal. She does not perceive auditory or visual hallucinations.        Mood and Affect: Mood is not depressed. Affect is not labile, blunt, angry or inappropriate.        Speech: Speech normal.        Behavior: Behavior normal.        Thought Content: Thought content normal. Thought content does not include homicidal or suicidal ideation. Thought content does not include homicidal or suicidal plan.        Cognition and Memory: Cognition and memory normal.        Judgment: Judgment normal.     Comments: Insight intact. No delusions.  Mood presents as mildly anxious     Lab Review:     Component Value Date/Time   NA 139 06/26/2017 0025   NA 138 08/31/2011 1624   K 3.7 06/26/2017 0025   K 4.1 08/31/2011 1624   CL 105 06/26/2017 0025   CL 102 08/31/2011 1624   CO2 25 06/26/2017 0025   CO2 27 08/31/2011 1624   GLUCOSE 110 (H) 06/26/2017 0025   GLUCOSE 81 08/31/2011 1624   BUN 9 06/26/2017 0025   BUN 10 08/31/2011 1624   CREATININE 0.88 06/26/2017 0025   CREATININE 0.88 08/31/2011 1624   CALCIUM 9.1 06/26/2017 0025   CALCIUM 9.1 08/31/2011 1624   PROT 7.4 06/26/2017 0025   PROT 7.8 08/31/2011 1624   ALBUMIN 4.5 06/26/2017 0025   ALBUMIN 4.0 08/31/2011 1624   AST 19 06/26/2017 0025   AST 25 08/31/2011 1624   ALT 14 06/26/2017 0025   ALT 24 08/31/2011 1624   ALKPHOS 49 06/26/2017 0025   ALKPHOS 52 08/31/2011 1624   BILITOT 0.5 06/26/2017 0025   BILITOT 0.3 08/31/2011 1624   GFRNONAA >60 06/26/2017 0025   GFRNONAA >60 08/31/2011 1624   GFRAA >60 06/26/2017 0025   GFRAA >60 08/31/2011 1624       Component Value Date/Time   WBC 6.4 06/26/2017 0025   RBC 3.85 06/26/2017 0025   HGB 12.1 06/26/2017 0025  HGB 12.7 08/31/2011 1624   HCT 34.8 (L) 06/26/2017 0025   HCT 37.7 08/31/2011 1624   PLT 246 06/26/2017 0025   PLT 248 08/31/2011 1624   MCV 90.3 06/26/2017 0025   MCV 90 08/31/2011 1624   MCH 31.5 06/26/2017 0025    MCHC 34.9 06/26/2017 0025   RDW 11.9 06/26/2017 0025   RDW 12.6 08/31/2011 1624   LYMPHSABS 2.2 04/07/2017 1145   MONOABS 0.4 04/07/2017 1145   EOSABS 0.1 04/07/2017 1145   BASOSABS 0.0 04/07/2017 1145    No results found for: POCLITH, LITHIUM   No results found for: PHENYTOIN, PHENOBARB, VALPROATE, CBMZ   .res Assessment: Plan:   Discussed possibly lowering dose of Librium. Discussed that she could continue Librium 10 mg po q am and 10-20 mg at bedtime since there has been some recent improvement in anxiety s/s.   Continue Klonopin and Librium for anxiety.  Continue gabapentin for anxiety.  Continue Trileptal for anxiety, mood, and insomnia since this has been effective for target s/s. Continue Latuda for mood and insomnia since Jordan has been effective for these s/s.   PTSD (post-traumatic stress disorder) - Plan: clonazePAM (KLONOPIN) 0.5 MG tablet, chlordiazePOXIDE (LIBRIUM) 10 MG capsule, cloNIDine (CATAPRES) 0.1 MG tablet  Generalized anxiety disorder - Plan: chlordiazePOXIDE (LIBRIUM) 10 MG capsule, cloNIDine (CATAPRES) 0.1 MG tablet  Mild mood disorder (HCC) - Plan: oxcarbazepine (TRILEPTAL) 600 MG tablet, lurasidone (LATUDA) 80 MG TABS tablet  Primary insomnia  Generalized anxiety disorder - Chronic with acute exacerbation - Plan: chlordiazePOXIDE (LIBRIUM) 10 MG capsule, cloNIDine (CATAPRES) 0.1 MG tablet  PTSD (post-traumatic stress disorder) - Chronic with acute exacerbation - Plan: clonazePAM (KLONOPIN) 0.5 MG tablet, chlordiazePOXIDE (LIBRIUM) 10 MG capsule, cloNIDine (CATAPRES) 0.1 MG tablet  Please see After Visit Summary for patient specific instructions.  Future Appointments  Date Time Provider Department Center  10/16/2018  1:30 PM Corie Chiquito, PMHNP [facility] None    No orders of the defined types were placed in this encounter.     -------------------------------

## 2018-09-07 ENCOUNTER — Other Ambulatory Visit: Payer: Self-pay | Admitting: Psychiatry

## 2018-09-07 DIAGNOSIS — F4310 Post-traumatic stress disorder, unspecified: Secondary | ICD-10-CM

## 2018-09-10 ENCOUNTER — Other Ambulatory Visit: Payer: Self-pay | Admitting: Psychiatry

## 2018-09-10 DIAGNOSIS — F4310 Post-traumatic stress disorder, unspecified: Secondary | ICD-10-CM

## 2018-10-16 ENCOUNTER — Ambulatory Visit (INDEPENDENT_AMBULATORY_CARE_PROVIDER_SITE_OTHER): Payer: PRIVATE HEALTH INSURANCE | Admitting: Psychiatry

## 2018-10-16 ENCOUNTER — Encounter: Payer: Self-pay | Admitting: Psychiatry

## 2018-10-16 ENCOUNTER — Other Ambulatory Visit: Payer: Self-pay

## 2018-10-16 DIAGNOSIS — F4310 Post-traumatic stress disorder, unspecified: Secondary | ICD-10-CM

## 2018-10-16 DIAGNOSIS — F5101 Primary insomnia: Secondary | ICD-10-CM | POA: Diagnosis not present

## 2018-10-16 DIAGNOSIS — F411 Generalized anxiety disorder: Secondary | ICD-10-CM

## 2018-10-16 DIAGNOSIS — F39 Unspecified mood [affective] disorder: Secondary | ICD-10-CM

## 2018-10-16 NOTE — Progress Notes (Signed)
Peggy Ruiz [id number] 29-Apr-1986 33 y.o.  Virtual Visit via Telephone Note  I connected with Peggy Ruiz on 10/16/18 at  1:30 PM EDT by telephone and verified that I am speaking with the correct person using two identifiers.   I discussed the limitations, risks, security and privacy concerns of performing an evaluation and management service by telephone and the availability of in person appointments. I also discussed with the patient that there may be a patient responsible charge related to this service. The patient expressed understanding and agreed to proceed.  I discussed the assessment and treatment plan with the patient. The patient was provided an opportunity to ask questions and all were answered. The patient agreed with the plan and demonstrated an understanding of the instructions.   The patient was advised to call back or seek an in-person evaluation if the symptoms worsen or if the condition fails to improve as anticipated.  I provided 30 minutes of non-face-to-face time during this encounter.  The patient was at home.  The provider was at home.   Corie Chiquito, PMHNP    Subjective:   Patient ID:  Peggy Ruiz is a 33 y.o. (DOB 01-16-1986) female.  Chief Complaint:  Chief Complaint  Patient presents with  . Anxiety  . Insomnia    HPI MINDIE RAWDON presents for follow-up of mood, anxiety, and insomnia. Reports that clonidine and Latuda initially helped with her sleep. She reports that she had 5-6 week period of interrupted sleep after last visit. She reports that she is awakening multiple times a night, typically every 2 hours. She reports that her sleep worsened when anxiety increased. Reports that husband's hours and salary have been reduced to 80% with the possibility of further reductions and that this has caused financial stress. She reports that she has been having significant anxiety. Reports that she had panic attacks daily and that panic attacks were  triggered by events that normally would not have upset her. Reports frequent worry and rumination. Reports that she has been more socially isolated due to pandemic. Reports pandemic "has blown everything off the charts" in terms of limited access to health care treatments, financial strain, and overall uncertainty. She reports that "I'm not really depressed with what's going on. I'm more angry... agitated, irritated." Appetite has been stable and reports that her weight has been stable around 145 lbs-150 lbs. Denies SI.  Reports that she has been taking Librium 10 mg po BID.  Past Psychiatric Medication Trials: Cymbalta Prozac Lexapro Paxil Effexor Nortriptyline-prescribed by GI for gastroparesis Remeron-caused insomnia Abilify-involuntary movements Zyprexa-effective for a period of time. Caused excessive weight gain at times. Seroquel Latuda-effective. Sleeping "harder" on 80 mg. Lamictal Gabapentin- Cannot tolerate doses higher than 800 mg BID Trileptal- Reports that she was unable to tolerate 900 mg QHS Belsomra-helpful with sleep quality but not sleep quantity. Klonopin Ativan Librium Clonidine- Effective Propranolol Trazodone Ambien Nuvigil Adderall  Review of Systems:  Review of Systems  Musculoskeletal: Positive for back pain and neck pain.  Neurological: Positive for headaches.    Medications: I have reviewed the patient's current medications.  Current Outpatient Medications  Medication Sig Dispense Refill  . chlordiazePOXIDE (LIBRIUM) 10 MG capsule Take 1 po q am and 2 po QHS 90 capsule 2  . clonazePAM (KLONOPIN) 0.5 MG tablet TAKE 1/2 TO 1 TABLET BY MOUTH IN THE MORNING AND TAKE 1 TABLET BY MOUTH AT BEDTIME 60 tablet 5  . cloNIDine (CATAPRES) 0.1 MG tablet Take 1 tab  po QHS and 1 qd prn 60 tablet 2  . dexlansoprazole (DEXILANT) 60 MG capsule Take 60 mg by mouth every morning.    . gabapentin (NEURONTIN) 800 MG tablet Take 800 mg by mouth 2 (two) times daily.     Marland Kitchen levothyroxine (SYNTHROID, LEVOTHROID) 50 MCG tablet Take 50 mcg by mouth daily.    . magnesium 30 MG tablet Take 30 mg by mouth 2 (two) times daily.    . methocarbamol (ROBAXIN) 750 MG tablet Take 750 mg by mouth every 8 (eight) hours as needed.     . Multiple Vitamin (MULTIVITAMIN) tablet Take 1 tablet by mouth daily.    . nortriptyline (PAMELOR) 10 MG capsule Take 10 mg by mouth at bedtime.    . ondansetron (ZOFRAN-ODT) 8 MG disintegrating tablet Take 8 mg by mouth every 8 (eight) hours as needed for nausea.    Marland Kitchen oxcarbazepine (TRILEPTAL) 600 MG tablet Take 1 tablet (600 mg total) by mouth at bedtime. 30 tablet 5  . Probiotic Product (PROBIOTIC DAILY PO) Take by mouth.    . levonorgestrel-ethinyl estradiol (SEASONALE,INTROVALE,JOLESSA) 0.15-0.03 MG tablet Take 1 tablet by mouth daily.    Marland Kitchen lurasidone (LATUDA) 80 MG TABS tablet Take 1 tablet (80 mg total) by mouth daily with supper for 30 days. 30 tablet 2   No current facility-administered medications for this visit.     Medication Side Effects: None  Allergies:  Allergies  Allergen Reactions  . Promethazine     Other reaction(s): Agitation  . Ciprofloxacin Other (See Comments)    c-diff  . Dilaudid [Hydromorphone]     sob  . Penicillins Hives    Has patient had a PCN reaction causing immediate rash, facial/tongue/throat swelling, SOB or lightheadedness with hypotension: Yes Has patient had a PCN reaction causing severe rash involving mucus membranes or skin necrosis: No Has patient had a PCN reaction that required hospitalization: No Has patient had a PCN reaction occurring within the last 10 years: Yes If all of the above answers are "NO", then may proceed with Cephalosporin use.   . Toradol [Ketorolac Tromethamine] Nausea And Vomiting    Past Medical History:  Diagnosis Date  . Anorexia    at 15  . Anxiety   . Attention deficit disorder (ADD)   . Bone spur   . Depression   . HPV (human papilloma virus) infection      Family History  Problem Relation Age of Onset  . Fibromyalgia Mother   . Anxiety disorder Mother   . Depression Mother   . Depression Father   . Anxiety disorder Father     Social History   Socioeconomic History  . Marital status: Single    Spouse name: Not on file  . Number of children: Not on file  . Years of education: Not on file  . Highest education level: Not on file  Occupational History  . Not on file  Social Needs  . Financial resource strain: Not on file  . Food insecurity:    Worry: Not on file    Inability: Not on file  . Transportation needs:    Medical: Not on file    Non-medical: Not on file  Tobacco Use  . Smoking status: Never Smoker  . Smokeless tobacco: Never Used  Substance and Sexual Activity  . Alcohol use: Yes    Comment: occasion  . Drug use: No  . Sexual activity: Never  Lifestyle  . Physical activity:    Days per week:  Not on file    Minutes per session: Not on file  . Stress: Not on file  Relationships  . Social connections:    Talks on phone: Not on file    Gets together: Not on file    Attends religious service: Not on file    Active member of club or organization: Not on file    Attends meetings of clubs or organizations: Not on file    Relationship status: Not on file  . Intimate partner violence:    Fear of current or ex partner: Not on file    Emotionally abused: Not on file    Physically abused: Not on file    Forced sexual activity: Not on file  Other Topics Concern  . Not on file  Social History Narrative   Regular exercise: none   Caffeine use: daily    Past Medical History, Surgical history, Social history, and Family history were reviewed and updated as appropriate.   Please see review of systems for further details on the patient's review from today.   Objective:   Physical Exam:  There were no vitals taken for this visit.  Physical Exam Neurological:     Mental Status: She is alert and oriented to  person, place, and time.     Cranial Nerves: No dysarthria.  Psychiatric:        Attention and Perception: Attention normal.        Mood and Affect: Mood is anxious.        Speech: Speech is rapid and pressured.        Behavior: Behavior is cooperative.        Thought Content: Thought content normal. Thought content is not paranoid or delusional. Thought content does not include homicidal or suicidal ideation. Thought content does not include homicidal or suicidal plan.        Cognition and Memory: Cognition and memory normal.        Judgment: Judgment normal.     Comments: Speech: Increased volume     Lab Review:     Component Value Date/Time   NA 139 06/26/2017 0025   NA 138 08/31/2011 1624   K 3.7 06/26/2017 0025   K 4.1 08/31/2011 1624   CL 105 06/26/2017 0025   CL 102 08/31/2011 1624   CO2 25 06/26/2017 0025   CO2 27 08/31/2011 1624   GLUCOSE 110 (H) 06/26/2017 0025   GLUCOSE 81 08/31/2011 1624   BUN 9 06/26/2017 0025   BUN 10 08/31/2011 1624   CREATININE 0.88 06/26/2017 0025   CREATININE 0.88 08/31/2011 1624   CALCIUM 9.1 06/26/2017 0025   CALCIUM 9.1 08/31/2011 1624   PROT 7.4 06/26/2017 0025   PROT 7.8 08/31/2011 1624   ALBUMIN 4.5 06/26/2017 0025   ALBUMIN 4.0 08/31/2011 1624   AST 19 06/26/2017 0025   AST 25 08/31/2011 1624   ALT 14 06/26/2017 0025   ALT 24 08/31/2011 1624   ALKPHOS 49 06/26/2017 0025   ALKPHOS 52 08/31/2011 1624   BILITOT 0.5 06/26/2017 0025   BILITOT 0.3 08/31/2011 1624   GFRNONAA >60 06/26/2017 0025   GFRNONAA >60 08/31/2011 1624   GFRAA >60 06/26/2017 0025   GFRAA >60 08/31/2011 1624       Component Value Date/Time   WBC 6.4 06/26/2017 0025   RBC 3.85 06/26/2017 0025   HGB 12.1 06/26/2017 0025   HGB 12.7 08/31/2011 1624   HCT 34.8 (L) 06/26/2017 0025   HCT 37.7 08/31/2011 1624  PLT 246 06/26/2017 0025   PLT 248 08/31/2011 1624   MCV 90.3 06/26/2017 0025   MCV 90 08/31/2011 1624   MCH 31.5 06/26/2017 0025   MCHC 34.9  06/26/2017 0025   RDW 11.9 06/26/2017 0025   RDW 12.6 08/31/2011 1624   LYMPHSABS 2.2 04/07/2017 1145   MONOABS 0.4 04/07/2017 1145   EOSABS 0.1 04/07/2017 1145   BASOSABS 0.0 04/07/2017 1145    No results found for: POCLITH, LITHIUM   No results found for: PHENYTOIN, PHENOBARB, VALPROATE, CBMZ   .res Assessment: Plan:   Advised patient to resume previous dose of Librium 10 mg po q am and 20 mg po QHS instead of 10 mg BID due to acute exacerbation of anxiety and significant psychosocial stressors.  Discussed that higher dose of Librium may also improve insomnia. Discussed taking clonidine 1/2 tab po q am and 1 tab po QHS to prevent escalation of anxiety. May increase to 1 tab po BID if tolerating without difficulty after 5-7 days. Recommend continuing Latuda and Trileptal for mood stabilization. Patient reports that she does not need any refills at this time.  Patient encouraged to contact office if she needs a refill before next scheduled appointment. Consider resuming therapy with Marliss CzarAndy Mitchum, PhD.   PTSD (post-traumatic stress disorder)  Generalized anxiety disorder  Primary insomnia  Mild mood disorder (HCC)  Please see After Visit Summary for patient specific instructions.  Future Appointments  Date Time Provider Department Center  11/14/2018  1:00 PM Corie Chiquitoarter, Jerriyah Louis, PMHNP [facility] None    No orders of the defined types were placed in this encounter.     -------------------------------

## 2018-11-14 ENCOUNTER — Other Ambulatory Visit: Payer: Self-pay

## 2018-11-14 ENCOUNTER — Ambulatory Visit: Payer: PRIVATE HEALTH INSURANCE | Admitting: Psychiatry

## 2018-11-14 ENCOUNTER — Encounter: Payer: Self-pay | Admitting: Psychiatry

## 2018-11-14 DIAGNOSIS — F5101 Primary insomnia: Secondary | ICD-10-CM | POA: Diagnosis not present

## 2018-11-14 DIAGNOSIS — F4310 Post-traumatic stress disorder, unspecified: Secondary | ICD-10-CM | POA: Diagnosis not present

## 2018-11-14 DIAGNOSIS — F411 Generalized anxiety disorder: Secondary | ICD-10-CM | POA: Diagnosis not present

## 2018-11-14 DIAGNOSIS — F39 Unspecified mood [affective] disorder: Secondary | ICD-10-CM | POA: Diagnosis not present

## 2018-11-14 MED ORDER — CHLORDIAZEPOXIDE HCL 10 MG PO CAPS: ORAL_CAPSULE | ORAL | 2 refills | Status: DC

## 2018-11-14 MED ORDER — CLONIDINE HCL 0.1 MG PO TABS: ORAL_TABLET | ORAL | 2 refills | Status: DC

## 2018-11-14 NOTE — Progress Notes (Signed)
Peggy Ruiz [id number] 1985/11/30 33 y.o.  Virtual Visit via Telephone Note  I connected with@ on 11/14/18 at  1:00 PM EDT by telephone and verified that I am speaking with the correct person using two identifiers.   I discussed the limitations, risks, security and privacy concerns of performing an evaluation and management service by telephone and the availability of in person appointments. I also discussed with the patient that there may be a patient responsible charge related to this service. The patient expressed understanding and agreed to proceed.   I discussed the assessment and treatment plan with the patient. The patient was provided an opportunity to ask questions and all were answered. The patient agreed with the plan and demonstrated an understanding of the instructions.   The patient was advised to call back or seek an in-person evaluation if the symptoms worsen or if the condition fails to improve as anticipated.  I provided 30 minutes of non-face-to-face time during this encounter.  The patient was located at home.  The provider was located at home.   Corie Chiquito, PMHNP   Subjective:   Patient ID:  Peggy Ruiz is a 33 y.o. (DOB 12-23-1985) female.  Chief Complaint:  Chief Complaint  Patient presents with  . Anxiety  . Follow-up    h/o Mood Disturbance and insomnia    HPI Peggy Ruiz presents for follow-up of anxiety, depression, and sleep disturbance. She reports "I've been doing a little better." She reports that was less fatigued and anxious until the last 2 days. She reports that 2 days ago when leaving PT "fatigue, exhaustion set in" and then blacked out. She reports that her legs felt week and her VS were WNL. She reports that she had eaten before this incident. Reports that she felt nauseous, dizzy, and disoriented. She reports that she was unable to drive herself home from PT and laid on exam table until husband could pick her up and this has resulted in  increased pain. Pt reports that this entire incident resulted in some increased frustration. Denies feeling anxious before or during episode. She reports that she has had similar episodes in recent years and medical w/u has been negative.   She reports that her anxiety has improved with taking previous dose of Librium and adding 1/2 tab of Clonidine in the morning. She reports, "I'm still anxious but not nearly as anxious as I was." Denies any recent episodes of panic or uncontrolled crying. Reports anxiety has been more manageable compared to one month ago. She reports that she continues to have some worry and rumination.   She reports that her mood has been "more stable... I still get irritated" more easily than she would like. Describes incident of irritability earlier today when her husband did not call her when he said he would. Denies depressed mood. Reports mild mood lability. She reports that her sleep has improved with occasional nights of middle of the night awakening and that this has decreased to 1-2 times a week. She reports that her appetite has "been the same." She reports that she may have gained 5 lbs in the last few months. Denies any change in energy- "my normal fatigue." Denies any change in motivation. Concentration has been "really good." Reports that she has been doing a Bible study online and has been able to focus on this and reading a book. Denies SI.   Denies any recent manic s/s.  Has started seeing a therapist, Alto [place], LPC weekly  at [facility]. Pt reports that this has been helpful.   Past Psychiatric Medication Trials: Cymbalta Prozac Lexapro Paxil Effexor Nortriptyline-prescribed by GI for gastroparesis Remeron-caused insomnia Abilify-involuntary movements Zyprexa-effective for a period of time. Caused excessive weight gain at times. Seroquel Latuda-effective. Sleeping "harder" on 80 mg. Lamictal Gabapentin- Cannot tolerate doses higher  than 800 mg BID Trileptal- Reports that she was unable to tolerate 900 mg QHS Belsomra-helpful with sleep quality but not sleep quantity. Klonopin Ativan Librium Clonidine- Effective Propranolol Trazodone Ambien Nuvigil Adderall  Review of Systems:  Review of Systems  Gastrointestinal: Positive for constipation.  Musculoskeletal: Positive for arthralgias, back pain and neck pain. Negative for gait problem.  Neurological: Negative for tremors.       Reports recent episode of dizziness, nausea, and weakness. Denies any other episodes of dizziness or weakness.   Psychiatric/Behavioral:       Please refer to HPI    Medications: I have reviewed the patient's current medications.  Current Outpatient Medications  Medication Sig Dispense Refill  . [START ON 12/12/2018] chlordiazePOXIDE (LIBRIUM) 10 MG capsule Take 1 po q am and 2 po QHS 90 capsule 2  . clonazePAM (KLONOPIN) 0.5 MG tablet TAKE 1/2 TO 1 TABLET BY MOUTH IN THE MORNING AND TAKE 1 TABLET BY MOUTH AT BEDTIME 60 tablet 5  . cloNIDine (CATAPRES) 0.1 MG tablet Take 1 tab po QHS and 1 qd prn 60 tablet 2  . dexlansoprazole (DEXILANT) 60 MG capsule Take 60 mg by mouth every morning.    . gabapentin (NEURONTIN) 800 MG tablet Take 800 mg by mouth 2 (two) times daily.    Marland Kitchen levothyroxine (SYNTHROID, LEVOTHROID) 50 MCG tablet Take 50 mcg by mouth daily.    . magnesium 30 MG tablet Take 30 mg by mouth 2 (two) times daily.    . methocarbamol (ROBAXIN) 750 MG tablet Take 750 mg by mouth every 8 (eight) hours as needed.     . Multiple Vitamin (MULTIVITAMIN) tablet Take 1 tablet by mouth daily.    . nortriptyline (PAMELOR) 10 MG capsule Take 10 mg by mouth at bedtime.    . ondansetron (ZOFRAN-ODT) 8 MG disintegrating tablet Take 8 mg by mouth every 8 (eight) hours as needed for nausea.    Marland Kitchen oxcarbazepine (TRILEPTAL) 600 MG tablet Take 1 tablet (600 mg total) by mouth at bedtime. 30 tablet 5  . Probiotic Product (PROBIOTIC DAILY PO) Take by  mouth.    . levonorgestrel-ethinyl estradiol (SEASONALE,INTROVALE,JOLESSA) 0.15-0.03 MG tablet Take 1 tablet by mouth daily.    Marland Kitchen lurasidone (LATUDA) 80 MG TABS tablet Take 1 tablet (80 mg total) by mouth daily with supper for 30 days. 30 tablet 2   No current facility-administered medications for this visit.     Medication Side Effects: None  Allergies:  Allergies  Allergen Reactions  . Promethazine     Other reaction(s): Agitation  . Ciprofloxacin Other (See Comments)    c-diff  . Dilaudid [Hydromorphone]     sob  . Penicillins Hives    Has patient had a PCN reaction causing immediate rash, facial/tongue/throat swelling, SOB or lightheadedness with hypotension: Yes Has patient had a PCN reaction causing severe rash involving mucus membranes or skin necrosis: No Has patient had a PCN reaction that required hospitalization: No Has patient had a PCN reaction occurring within the last 10 years: Yes If all of the above answers are "NO", then may proceed with Cephalosporin use.   . Toradol [Ketorolac Tromethamine] Nausea And  Vomiting    Past Medical History:  Diagnosis Date  . Anorexia    at 15  . Anxiety   . Attention deficit disorder (ADD)   . Bone spur   . Depression   . HPV (human papilloma virus) infection     Family History  Problem Relation Age of Onset  . Fibromyalgia Mother   . Anxiety disorder Mother   . Depression Mother   . Depression Father   . Anxiety disorder Father     Social History   Socioeconomic History  . Marital status: Single    Spouse name: Not on file  . Number of children: Not on file  . Years of education: Not on file  . Highest education level: Not on file  Occupational History  . Not on file  Social Needs  . Financial resource strain: Not on file  . Food insecurity:    Worry: Not on file    Inability: Not on file  . Transportation needs:    Medical: Not on file    Non-medical: Not on file  Tobacco Use  . Smoking status: Never  Smoker  . Smokeless tobacco: Never Used  Substance and Sexual Activity  . Alcohol use: Yes    Comment: occasion  . Drug use: No  . Sexual activity: Never  Lifestyle  . Physical activity:    Days per week: Not on file    Minutes per session: Not on file  . Stress: Not on file  Relationships  . Social connections:    Talks on phone: Not on file    Gets together: Not on file    Attends religious service: Not on file    Active member of club or organization: Not on file    Attends meetings of clubs or organizations: Not on file    Relationship status: Not on file  . Intimate partner violence:    Fear of current or ex partner: Not on file    Emotionally abused: Not on file    Physically abused: Not on file    Forced sexual activity: Not on file  Other Topics Concern  . Not on file  Social History Narrative   Regular exercise: none   Caffeine use: daily    Past Medical History, Surgical history, Social history, and Family history were reviewed and updated as appropriate.   Please see review of systems for further details on the patient's review from today.   Objective:   Physical Exam:  There were no vitals taken for this visit.  Physical Exam Neurological:     Mental Status: She is alert and oriented to person, place, and time.     Cranial Nerves: No dysarthria.  Psychiatric:        Attention and Perception: Attention normal.        Mood and Affect: Mood is anxious.        Speech: Speech normal.        Behavior: Behavior is cooperative.        Thought Content: Thought content normal. Thought content is not paranoid or delusional. Thought content does not include homicidal or suicidal ideation. Thought content does not include homicidal or suicidal plan.        Cognition and Memory: Cognition and memory normal.        Judgment: Judgment normal.     Comments: Mood presents as less anxious compared to last exam.     Lab Review:     Component Value Date/Time  NA  139 06/26/2017 0025   NA 138 08/31/2011 1624   K 3.7 06/26/2017 0025   K 4.1 08/31/2011 1624   CL 105 06/26/2017 0025   CL 102 08/31/2011 1624   CO2 25 06/26/2017 0025   CO2 27 08/31/2011 1624   GLUCOSE 110 (H) 06/26/2017 0025   GLUCOSE 81 08/31/2011 1624   BUN 9 06/26/2017 0025   BUN 10 08/31/2011 1624   CREATININE 0.88 06/26/2017 0025   CREATININE 0.88 08/31/2011 1624   CALCIUM 9.1 06/26/2017 0025   CALCIUM 9.1 08/31/2011 1624   PROT 7.4 06/26/2017 0025   PROT 7.8 08/31/2011 1624   ALBUMIN 4.5 06/26/2017 0025   ALBUMIN 4.0 08/31/2011 1624   AST 19 06/26/2017 0025   AST 25 08/31/2011 1624   ALT 14 06/26/2017 0025   ALT 24 08/31/2011 1624   ALKPHOS 49 06/26/2017 0025   ALKPHOS 52 08/31/2011 1624   BILITOT 0.5 06/26/2017 0025   BILITOT 0.3 08/31/2011 1624   GFRNONAA >60 06/26/2017 0025   GFRNONAA >60 08/31/2011 1624   GFRAA >60 06/26/2017 0025   GFRAA >60 08/31/2011 1624       Component Value Date/Time   WBC 6.4 06/26/2017 0025   RBC 3.85 06/26/2017 0025   HGB 12.1 06/26/2017 0025   HGB 12.7 08/31/2011 1624   HCT 34.8 (L) 06/26/2017 0025   HCT 37.7 08/31/2011 1624   PLT 246 06/26/2017 0025   PLT 248 08/31/2011 1624   MCV 90.3 06/26/2017 0025   MCV 90 08/31/2011 1624   MCH 31.5 06/26/2017 0025   MCHC 34.9 06/26/2017 0025   RDW 11.9 06/26/2017 0025   RDW 12.6 08/31/2011 1624   LYMPHSABS 2.2 04/07/2017 1145   MONOABS 0.4 04/07/2017 1145   EOSABS 0.1 04/07/2017 1145   BASOSABS 0.0 04/07/2017 1145    No results found for: POCLITH, LITHIUM   No results found for: PHENYTOIN, PHENOBARB, VALPROATE, CBMZ   .res Assessment: Plan:   Discussed treatment plan and agreed to continue current medications at this time and will revisit reducing benzodiazepines in the future when anxiety improves with resuming therapy and decrease in psychosocial stressors.  Continue Librium 10 mg p.o. every morning and 20 mg p.o. nightly for anxiety. Continue Klonopin 0.5 mg 1/2 to 1  tablet in the morning and 1 tablet at bedtime for anxiety. Continue clonidine 0.1 mg 1/2 tablet twice daily as needed and 1 tablet at bedtime for anxiety. Continue Latuda 80 mg daily with evening meal for mood signs and symptoms. Continue Trileptal 600 mg at bedtime for anxiety, insomnia, and mood stabilization.  Patient to follow-up in 6 weeks or sooner if clinically indicated. Generalized anxiety disorder - Plan: cloNIDine (CATAPRES) 0.1 MG tablet, chlordiazePOXIDE (LIBRIUM) 10 MG capsule  PTSD (post-traumatic stress disorder) - Plan: cloNIDine (CATAPRES) 0.1 MG tablet, chlordiazePOXIDE (LIBRIUM) 10 MG capsule  Mild mood disorder (HCC)  Primary insomnia  Please see After Visit Summary for patient specific instructions.  Future Appointments  Date Time Provider Department Center  12/25/2018  2:00 PM Corie Chiquitoarter, Krystalle Pilkington, PMHNP CP-CP None    No orders of the defined types were placed in this encounter.     -------------------------------

## 2018-12-25 ENCOUNTER — Encounter: Payer: Self-pay | Admitting: Psychiatry

## 2018-12-25 ENCOUNTER — Other Ambulatory Visit: Payer: Self-pay

## 2018-12-25 ENCOUNTER — Ambulatory Visit: Payer: PRIVATE HEALTH INSURANCE | Admitting: Psychiatry

## 2018-12-25 DIAGNOSIS — F39 Unspecified mood [affective] disorder: Secondary | ICD-10-CM | POA: Diagnosis not present

## 2018-12-25 MED ORDER — LURASIDONE HCL 80 MG PO TABS: 80.0000 mg | ORAL_TABLET | Freq: Every day | ORAL | 2 refills | Status: DC

## 2018-12-25 MED ORDER — LATUDA 60 MG PO TABS: 60.0000 mg | ORAL_TABLET | Freq: Every day | ORAL | 0 refills | Status: DC

## 2018-12-25 NOTE — Progress Notes (Signed)
Peggy ShadowLeah C Groome [id number] 03/19/1986 33 y.o.  Virtual Visit via Telephone Note  I connected with pt on 12/25/18 at  2:00 PM EDT by telephone and verified that I am speaking with the correct person using two identifiers.   I discussed the limitations, risks, security and privacy concerns of performing an evaluation and management service by telephone and the availability of in person appointments. I also discussed with the patient that there may be a patient responsible charge related to this service. The patient expressed understanding and agreed to proceed.   I discussed the assessment and treatment plan with the patient. The patient was provided an opportunity to ask questions and all were answered. The patient agreed with the plan and demonstrated an understanding of the instructions.   The patient was advised to call back or seek an in-person evaluation if the symptoms worsen or if the condition fails to improve as anticipated.  I provided 30 minutes of non-face-to-face time during this encounter.  The patient was located at home.  [facility]   Corie ChiquitoJessica Lillionna Nabi, PMHNP   Subjective:   Patient ID:  Peggy Ruiz is a 33 y.o. (DOB 02/11/1986) female.  Chief Complaint:  Chief Complaint  Patient presents with  . Anxiety    HPI Peggy ShadowLeah C Ruiz presents for follow-up of anxiety, mood disturbance, and insomnia. She reports several recent stressors since last visit. She reports that her mother attempted suicide in late May and father found mother unresponsive. Reports that mother was unconscious for 3 days and was on medical unit and then transferred her to behavioral health.  She reports having severe anxiety in response to mother's suicide attempt and at one point she "thought I lost her." She reports that she was also worried about her father's well-being, particularly since he was not eating and not responding to attempts to communicate. She reports  that she is having obsessive thoughts and rumination about trying to help her mother to include researching possible sleep medications. She reports "it's difficult for me to separate" and focus on her and her parents instead of her parents. Reports that she continues to experience catastrophic thinking. She denies any recent severe panic attacks. She reports that she has had 2 episodes of mild panic s/s since last visit. Reports that Clonidine 0.1 mg 1/2 tab po q am and 1 tab po QHS has "really made a difference" in anxiety.   She reports that she has been sleeping well most nights. She reports that her appetite has been lower and reports earlier satiety. She reports that her weight has been stable for the last 9 months. Reports that her energy has been lower over the last few weeks. She reports that her motivation has been adequate. Denies SI.   She reports that she is seeing a therapist weekly.   Past Psychiatric Medication Trials: Cymbalta Prozac Lexapro Paxil Effexor Nortriptyline-prescribed by GI for gastroparesis Remeron-caused insomnia Abilify-involuntary movements Zyprexa-effective for a period of time. Caused excessive weight gain at times. Seroquel Latuda-effective. Sleeping "harder" on 80 mg. Lamictal Gabapentin- Cannot tolerate doses higher than 800 mg BID Trileptal- Reports that she was unable to tolerate 900 mg QHS Belsomra-helpful with sleep quality but not sleep quantity. Klonopin Ativan Librium Clonidine- Effective Propranolol Trazodone Ambien Nuvigil Adderall   Review of Systems:  Review of Systems  Constitutional: Negative for unexpected weight change.  Gastrointestinal: Negative.   Musculoskeletal: Negative for gait problem.  Skin:  She reports open wounds on her lower extremities and now on upper extremities. She reports that she has seen PCP who questions if this may be ringworm with secondary bacterial infection. She reports that it has been  painful, itching, and weeping.  Neurological: Negative for tremors.  Psychiatric/Behavioral:       Please refer to HPI    Medications: I have reviewed the patient's current medications.  Current Outpatient Medications  Medication Sig Dispense Refill  . cephALEXin (KEFLEX) 500 MG capsule     . chlordiazePOXIDE (LIBRIUM) 10 MG capsule Take 1 po q am and 2 po QHS 90 capsule 2  . clonazePAM (KLONOPIN) 0.5 MG tablet TAKE 1/2 TO 1 TABLET BY MOUTH IN THE MORNING AND TAKE 1 TABLET BY MOUTH AT BEDTIME 60 tablet 5  . cloNIDine (CATAPRES) 0.1 MG tablet Take 1 tab po QHS and 1 qd prn 60 tablet 2  . dexlansoprazole (DEXILANT) 60 MG capsule Take 60 mg by mouth every morning.    . fluconazole (DIFLUCAN) 150 MG tablet Take 150 mg by mouth once a week.     . gabapentin (NEURONTIN) 800 MG tablet Take 800 mg by mouth 2 (two) times daily.    Marland Kitchen. levothyroxine (SYNTHROID, LEVOTHROID) 50 MCG tablet Take 50 mcg by mouth daily.    . magnesium 30 MG tablet Take 30 mg by mouth 2 (two) times daily.    . methocarbamol (ROBAXIN) 750 MG tablet Take 750 mg by mouth every 8 (eight) hours as needed.     . Multiple Vitamin (MULTIVITAMIN) tablet Take 1 tablet by mouth daily.    . nortriptyline (PAMELOR) 10 MG capsule Take 10 mg by mouth at bedtime.    . ondansetron (ZOFRAN-ODT) 8 MG disintegrating tablet Take 8 mg by mouth every 8 (eight) hours as needed for nausea.    Marland Kitchen. oxcarbazepine (TRILEPTAL) 600 MG tablet Take 1 tablet (600 mg total) by mouth at bedtime. 30 tablet 5  . Probiotic Product (PROBIOTIC DAILY PO) Take by mouth.    . levonorgestrel-ethinyl estradiol (SEASONALE,INTROVALE,JOLESSA) 0.15-0.03 MG tablet Take 1 tablet by mouth daily.    Melene Muller. [START ON 01/22/2019] lurasidone (LATUDA) 80 MG TABS tablet Take 1 tablet (80 mg total) by mouth daily with supper. 30 tablet 2  . Lurasidone HCl (LATUDA) 60 MG TABS Take 1 tablet (60 mg total) by mouth daily with supper. Take 60 mg dose while taking Flucanzole, then resume 80 mg qd  30 tablet 0   No current facility-administered medications for this visit.     Medication Side Effects: None  Allergies:  Allergies  Allergen Reactions  . Promethazine     Other reaction(s): Agitation  . Ciprofloxacin Other (See Comments)    c-diff  . Dilaudid [Hydromorphone]     sob  . Penicillins Hives    Has patient had a PCN reaction causing immediate rash, facial/tongue/throat swelling, SOB or lightheadedness with hypotension: Yes Has patient had a PCN reaction causing severe rash involving mucus membranes or skin necrosis: No Has patient had a PCN reaction that required hospitalization: No Has patient had a PCN reaction occurring within the last 10 years: Yes If all of the above answers are "NO", then may proceed with Cephalosporin use.   . Toradol [Ketorolac Tromethamine] Nausea And Vomiting    Past Medical History:  Diagnosis Date  . Anorexia    at 15  . Anxiety   . Attention deficit disorder (ADD)   . Bone spur   . Depression   .  HPV (human papilloma virus) infection     Family History  Problem Relation Age of Onset  . Fibromyalgia Mother   . Anxiety disorder Mother   . Depression Mother   . Depression Father   . Anxiety disorder Father     Social History   Socioeconomic History  . Marital status: Single    Spouse name: Not on file  . Number of children: Not on file  . Years of education: Not on file  . Highest education level: Not on file  Occupational History  . Not on file  Social Needs  . Financial resource strain: Not on file  . Food insecurity    Worry: Not on file    Inability: Not on file  . Transportation needs    Medical: Not on file    Non-medical: Not on file  Tobacco Use  . Smoking status: Never Smoker  . Smokeless tobacco: Never Used  Substance and Sexual Activity  . Alcohol use: Yes    Comment: occasion  . Drug use: No  . Sexual activity: Never  Lifestyle  . Physical activity    Days per week: Not on file    Minutes  per session: Not on file  . Stress: Not on file  Relationships  . Social Herbalist on phone: Not on file    Gets together: Not on file    Attends religious service: Not on file    Active member of club or organization: Not on file    Attends meetings of clubs or organizations: Not on file    Relationship status: Not on file  . Intimate partner violence    Fear of current or ex partner: Not on file    Emotionally abused: Not on file    Physically abused: Not on file    Forced sexual activity: Not on file  Other Topics Concern  . Not on file  Social History Narrative   Regular exercise: none   Caffeine use: daily    Past Medical History, Surgical history, Social history, and Family history were reviewed and updated as appropriate.   Please see review of systems for further details on the patient's review from today.   Objective:   Physical Exam:  Wt 150 lb (68 kg)   BMI 22.15 kg/m   Physical Exam Neurological:     Mental Status: She is alert and oriented to person, place, and time.     Cranial Nerves: No dysarthria.  Psychiatric:        Attention and Perception: Attention normal.        Mood and Affect: Mood is anxious.        Speech: Speech normal.        Behavior: Behavior is cooperative.        Thought Content: Thought content normal. Thought content is not paranoid or delusional. Thought content does not include homicidal or suicidal ideation. Thought content does not include homicidal or suicidal plan.        Cognition and Memory: Cognition and memory normal.        Judgment: Judgment normal.     Lab Review:     Component Value Date/Time   NA 139 06/26/2017 0025   NA 138 08/31/2011 1624   K 3.7 06/26/2017 0025   K 4.1 08/31/2011 1624   CL 105 06/26/2017 0025   CL 102 08/31/2011 1624   CO2 25 06/26/2017 0025   CO2 27 08/31/2011 1624   GLUCOSE  110 (H) 06/26/2017 0025   GLUCOSE 81 08/31/2011 1624   BUN 9 06/26/2017 0025   BUN 10 08/31/2011 1624    CREATININE 0.88 06/26/2017 0025   CREATININE 0.88 08/31/2011 1624   CALCIUM 9.1 06/26/2017 0025   CALCIUM 9.1 08/31/2011 1624   PROT 7.4 06/26/2017 0025   PROT 7.8 08/31/2011 1624   ALBUMIN 4.5 06/26/2017 0025   ALBUMIN 4.0 08/31/2011 1624   AST 19 06/26/2017 0025   AST 25 08/31/2011 1624   ALT 14 06/26/2017 0025   ALT 24 08/31/2011 1624   ALKPHOS 49 06/26/2017 0025   ALKPHOS 52 08/31/2011 1624   BILITOT 0.5 06/26/2017 0025   BILITOT 0.3 08/31/2011 1624   GFRNONAA >60 06/26/2017 0025   GFRNONAA >60 08/31/2011 1624   GFRAA >60 06/26/2017 0025   GFRAA >60 08/31/2011 1624       Component Value Date/Time   WBC 6.4 06/26/2017 0025   RBC 3.85 06/26/2017 0025   HGB 12.1 06/26/2017 0025   HGB 12.7 08/31/2011 1624   HCT 34.8 (L) 06/26/2017 0025   HCT 37.7 08/31/2011 1624   PLT 246 06/26/2017 0025   PLT 248 08/31/2011 1624   MCV 90.3 06/26/2017 0025   MCV 90 08/31/2011 1624   MCH 31.5 06/26/2017 0025   MCHC 34.9 06/26/2017 0025   RDW 11.9 06/26/2017 0025   RDW 12.6 08/31/2011 1624   LYMPHSABS 2.2 04/07/2017 1145   MONOABS 0.4 04/07/2017 1145   EOSABS 0.1 04/07/2017 1145   BASOSABS 0.0 04/07/2017 1145    No results found for: POCLITH, LITHIUM   No results found for: PHENYTOIN, PHENOBARB, VALPROATE, CBMZ   .res Assessment: Plan:   Will decrease Latuda to 60 mg po q evening with food for one month while pt is taking Fluconazole due to interaction with 3A4. Pt will resume Latuda 80 mg po q evening in one month when course of Fluconazole is completed.  Will continue all other medications as prescribed.  Patient advised to contact office with any questions, adverse effects, or acute worsening in signs and symptoms.  Tacey RuizLeah was seen today for anxiety.  Diagnoses and all orders for this visit:  Mild mood disorder (HCC) -     Lurasidone HCl (LATUDA) 60 MG TABS; Take 1 tablet (60 mg total) by mouth daily with supper. Take 60 mg dose while taking Flucanzole, then resume 80 mg  qd -     lurasidone (LATUDA) 80 MG TABS tablet; Take 1 tablet (80 mg total) by mouth daily with supper.    Please see After Visit Summary for patient specific instructions.  Future Appointments  Date Time Provider Department Center  02/25/2019  2:00 PM Corie Chiquitoarter, Hance Caspers, PMHNP [facility] None    No orders of the defined types were placed in this encounter.     -------------------------------

## 2019-01-04 ENCOUNTER — Telehealth: Payer: Self-pay | Admitting: Psychiatry

## 2019-01-04 NOTE — Telephone Encounter (Signed)
Peggy Ruiz called to report that since you lowered her Latuda dose from 80mg  to 60mg  because there was an interaction with an anti-fungal med she is taking, she has had panic attacks "like crazy" all week.  Please call with advise on what to do.  She is still taking the 60mg , but does have some 80mg  left over.  Next appt 02/25/19

## 2019-01-04 NOTE — Telephone Encounter (Signed)
Pt aware and has plenty of 80 mg at home. Instructed to call with anymore issues.

## 2019-02-25 ENCOUNTER — Ambulatory Visit (INDEPENDENT_AMBULATORY_CARE_PROVIDER_SITE_OTHER): Payer: PRIVATE HEALTH INSURANCE | Admitting: Psychiatry

## 2019-02-25 ENCOUNTER — Other Ambulatory Visit: Payer: Self-pay

## 2019-02-25 ENCOUNTER — Encounter: Payer: Self-pay | Admitting: Psychiatry

## 2019-02-25 DIAGNOSIS — F411 Generalized anxiety disorder: Secondary | ICD-10-CM

## 2019-02-25 DIAGNOSIS — F39 Unspecified mood [affective] disorder: Secondary | ICD-10-CM | POA: Diagnosis not present

## 2019-02-25 DIAGNOSIS — F4310 Post-traumatic stress disorder, unspecified: Secondary | ICD-10-CM

## 2019-02-25 MED ORDER — OXCARBAZEPINE 600 MG PO TABS: 600.0000 mg | ORAL_TABLET | Freq: Every day | ORAL | 5 refills | Status: DC

## 2019-02-25 MED ORDER — CHLORDIAZEPOXIDE HCL 10 MG PO CAPS: ORAL_CAPSULE | ORAL | 2 refills | Status: DC

## 2019-02-25 MED ORDER — CLONAZEPAM 0.5 MG PO TABS: ORAL_TABLET | ORAL | 2 refills | Status: DC

## 2019-02-25 MED ORDER — CLONIDINE HCL 0.1 MG PO TABS: ORAL_TABLET | ORAL | 2 refills | Status: DC

## 2019-02-25 MED ORDER — LURASIDONE HCL 80 MG PO TABS: 80.0000 mg | ORAL_TABLET | Freq: Every day | ORAL | 2 refills | Status: DC

## 2019-02-25 NOTE — Progress Notes (Signed)
   02/25/19 1424  Facial and Oral Movements  Muscles of Facial Expression 0  Lips and Perioral Area 0  Jaw 0  Tongue 0  Extremity Movements  Upper (arms, wrists, hands, fingers) 0  Lower (legs, knees, ankles, toes) 0  Trunk Movements  Neck, shoulders, hips 0  Overall Severity  Severity of abnormal movements (highest score from questions above) 0  Incapacitation due to abnormal movements 0  Patient's awareness of abnormal movements (rate only patient's report) 0  Dental Status  Current problems with teeth and/or dentures? No  Does patient usually wear dentures? No  AIMS Total Score  AIMS Total Score 0

## 2019-02-25 NOTE — Progress Notes (Signed)
Peggy Ruiz [id number] 1985/12/06 33 y.o.  Subjective:   Patient ID:  Peggy Ruiz is a 33 y.o. (DOB 05-21-1986) female.  Chief Complaint:  Chief Complaint  Patient presents with  . Anxiety  . Follow-up    h/o Mood disturbance, sleep disturbance    HPI Peggy Ruiz presents to the office today for follow-up of anxiety, mood, and sleep disturbance. She reports that her anxiety remains, "super,super high." She reports that Clonidine has helped with panic attacks "but I still get back anxiety attacks but not to where it was before." She reports that things she would consider minor will trigger her anxiety, such as the grocery store not having what she needed. She reports feelings of frustration in response to difficulty managing anxiety. She reports that her anxiety is triggered by multiple things, such as people asking what she does for a living.   She reports that her husband has been dealing with depression and anxiety. She reports that it is difficult for her to see him having difficulties and also it has been harder for him to encourage her like he has in the past. She reports that she has felt lonely since the move. She reports that she has had increased anxiety with more responsibility and having to maintain her home independently. She reports when they were living with her parents her father would help with errands and some chores. She reports feeling "useless" when she is not able to do the things she needs to do and wants to do physically. Rates anxiety an 8 or 9 out of 10, with 10= most distressed imaginable.  She reports that she is sleeping well and quality of sleep is better than it has been "in a really long time." Reports occasional restless sleep. Appetite has been ok and occ less at dinner. Reports that her weight has remained stable for approximately one year.   Had severe anxiety with lower doses of latuda when Latuda was decreased due to drug interaction with ketoconazole.    Continues to see therapist regularly. Reports that she has been unemployed for 4 years. Questions if she will be able to work again.   Past Psychiatric Medication Trials: Cymbalta Prozac Lexapro Paxil Effexor Nortriptyline-prescribed by GI for gastroparesis Remeron-caused insomnia Buspar- Adverse reaction Abilify-involuntary movements Zyprexa-effective for a period of time. Caused excessive weight gain at times. Seroquel Latuda-effective. Sleeping "harder" on 80 mg. Lamictal Gabapentin- Cannot tolerate doses higher than 800 mg BID Trileptal- Reports that she was unable to tolerate 900 mg QHS Belsomra-helpful with sleep quality but not sleep quantity. Klonopin Ativan Librium Clonidine- Effective Propranolol Trazodone Ambien Nuvigil Adderall   Review of Systems:  Review of Systems  HENT: Positive for hearing loss.   Musculoskeletal: Negative for gait problem.  Skin:       Reports that rash and skin infection have resolved. Has been dx'd with eczema.  Neurological: Positive for dizziness. Negative for tremors.  Psychiatric/Behavioral:       Please refer to HPI    Medications: I have reviewed the patient's current medications.  Current Outpatient Medications  Medication Sig Dispense Refill  . [START ON 03/17/2019] chlordiazePOXIDE (LIBRIUM) 10 MG capsule Take 1 po q am and 2 po QHS 90 capsule 2  . [START ON 03/05/2019] clonazePAM (KLONOPIN) 0.5 MG tablet TAKE 1/2 TO 1 TABLET BY MOUTH IN THE MORNING AND TAKE 1 TABLET BY MOUTH AT BEDTIME 60 tablet 2  . cloNIDine (CATAPRES) 0.1 MG tablet Take 1 tab po QHS  and 1 qd prn 60 tablet 2  . dexlansoprazole (DEXILANT) 60 MG capsule Take 60 mg by mouth every morning.    . gabapentin (NEURONTIN) 800 MG tablet Take 800 mg by mouth 2 (two) times daily.    Marland Kitchen. levothyroxine (SYNTHROID, LEVOTHROID) 50 MCG tablet Take 50 mcg by mouth daily.    . magnesium 30 MG tablet Take 30 mg by mouth 2 (two) times daily.    . methocarbamol (ROBAXIN)  750 MG tablet Take 750 mg by mouth every 8 (eight) hours as needed.     . Multiple Vitamin (MULTIVITAMIN) tablet Take 1 tablet by mouth daily.    . nortriptyline (PAMELOR) 10 MG capsule Take 10 mg by mouth at bedtime.    . ondansetron (ZOFRAN-ODT) 8 MG disintegrating tablet Take 8 mg by mouth every 8 (eight) hours as needed for nausea.    Marland Kitchen. oxcarbazepine (TRILEPTAL) 600 MG tablet Take 1 tablet (600 mg total) by mouth at bedtime. 30 tablet 5  . Probiotic Product (PROBIOTIC DAILY PO) Take by mouth.    . levonorgestrel-ethinyl estradiol (SEASONALE,INTROVALE,JOLESSA) 0.15-0.03 MG tablet Take 1 tablet by mouth daily.    Marland Kitchen. lurasidone (LATUDA) 80 MG TABS tablet Take 1 tablet (80 mg total) by mouth daily with supper. 30 tablet 2   No current facility-administered medications for this visit.     Medication Side Effects: None  Allergies:  Allergies  Allergen Reactions  . Promethazine     Other reaction(s): Agitation  . Ciprofloxacin Other (See Comments)    c-diff  . Dilaudid [Hydromorphone]     sob  . Epinephrine   . Penicillins Hives    Has patient had a PCN reaction causing immediate rash, facial/tongue/throat swelling, SOB or lightheadedness with hypotension: Yes Has patient had a PCN reaction causing severe rash involving mucus membranes or skin necrosis: No Has patient had a PCN reaction that required hospitalization: No Has patient had a PCN reaction occurring within the last 10 years: Yes If all of the above answers are "NO", then may proceed with Cephalosporin use.   . Toradol [Ketorolac Tromethamine] Nausea And Vomiting    Past Medical History:  Diagnosis Date  . Anorexia    at 15  . Anxiety   . Attention deficit disorder (ADD)   . Bone spur   . Depression   . HPV (human papilloma virus) infection     Family History  Problem Relation Age of Onset  . Fibromyalgia Mother   . Anxiety disorder Mother   . Depression Mother   . Depression Father   . Anxiety disorder Father      Social History   Socioeconomic History  . Marital status: Single    Spouse name: Not on file  . Number of children: Not on file  . Years of education: Not on file  . Highest education level: Not on file  Occupational History  . Not on file  Social Needs  . Financial resource strain: Not on file  . Food insecurity    Worry: Not on file    Inability: Not on file  . Transportation needs    Medical: Not on file    Non-medical: Not on file  Tobacco Use  . Smoking status: Never Smoker  . Smokeless tobacco: Never Used  Substance and Sexual Activity  . Alcohol use: Yes    Comment: occasion  . Drug use: No  . Sexual activity: Never  Lifestyle  . Physical activity    Days per week: Not  on file    Minutes per session: Not on file  . Stress: Not on file  Relationships  . Social Musician on phone: Not on file    Gets together: Not on file    Attends religious service: Not on file    Active member of club or organization: Not on file    Attends meetings of clubs or organizations: Not on file    Relationship status: Not on file  . Intimate partner violence    Fear of current or ex partner: Not on file    Emotionally abused: Not on file    Physically abused: Not on file    Forced sexual activity: Not on file  Other Topics Concern  . Not on file  Social History Narrative   Regular exercise: none   Caffeine use: daily    Past Medical History, Surgical history, Social history, and Family history were reviewed and updated as appropriate.   Please see review of systems for further details on the patient's review from today.   Objective:   Physical Exam:  BP 109/72   Pulse 88   Wt 151 lb (68.5 kg)   BMI 22.30 kg/m   Physical Exam Constitutional:      General: She is not in acute distress.    Appearance: She is well-developed.  Musculoskeletal:        General: No deformity.  Neurological:     Mental Status: She is alert and oriented to person, place,  and time.     Coordination: Coordination normal.  Psychiatric:        Attention and Perception: Attention and perception normal. She does not perceive auditory or visual hallucinations.        Mood and Affect: Mood is anxious. Mood is not depressed. Affect is not labile, blunt, angry or inappropriate.        Speech: Speech normal.        Behavior: Behavior normal.        Thought Content: Thought content normal. Thought content does not include homicidal or suicidal ideation. Thought content does not include homicidal or suicidal plan.        Cognition and Memory: Cognition and memory normal.        Judgment: Judgment normal.     Comments: Insight intact. No delusions.      Lab Review:     Component Value Date/Time   NA 139 06/26/2017 0025   NA 138 08/31/2011 1624   K 3.7 06/26/2017 0025   K 4.1 08/31/2011 1624   CL 105 06/26/2017 0025   CL 102 08/31/2011 1624   CO2 25 06/26/2017 0025   CO2 27 08/31/2011 1624   GLUCOSE 110 (H) 06/26/2017 0025   GLUCOSE 81 08/31/2011 1624   BUN 9 06/26/2017 0025   BUN 10 08/31/2011 1624   CREATININE 0.88 06/26/2017 0025   CREATININE 0.88 08/31/2011 1624   CALCIUM 9.1 06/26/2017 0025   CALCIUM 9.1 08/31/2011 1624   PROT 7.4 06/26/2017 0025   PROT 7.8 08/31/2011 1624   ALBUMIN 4.5 06/26/2017 0025   ALBUMIN 4.0 08/31/2011 1624   AST 19 06/26/2017 0025   AST 25 08/31/2011 1624   ALT 14 06/26/2017 0025   ALT 24 08/31/2011 1624   ALKPHOS 49 06/26/2017 0025   ALKPHOS 52 08/31/2011 1624   BILITOT 0.5 06/26/2017 0025   BILITOT 0.3 08/31/2011 1624   GFRNONAA >60 06/26/2017 0025   GFRNONAA >60 08/31/2011 1624   GFRAA >60  06/26/2017 0025   GFRAA >60 08/31/2011 1624       Component Value Date/Time   WBC 6.4 06/26/2017 0025   RBC 3.85 06/26/2017 0025   HGB 12.1 06/26/2017 0025   HGB 12.7 08/31/2011 1624   HCT 34.8 (L) 06/26/2017 0025   HCT 37.7 08/31/2011 1624   PLT 246 06/26/2017 0025   PLT 248 08/31/2011 1624   MCV 90.3 06/26/2017 0025    MCV 90 08/31/2011 1624   MCH 31.5 06/26/2017 0025   MCHC 34.9 06/26/2017 0025   RDW 11.9 06/26/2017 0025   RDW 12.6 08/31/2011 1624   LYMPHSABS 2.2 04/07/2017 1145   MONOABS 0.4 04/07/2017 1145   EOSABS 0.1 04/07/2017 1145   BASOSABS 0.0 04/07/2017 1145    No results found for: POCLITH, LITHIUM   No results found for: PHENYTOIN, PHENOBARB, VALPROATE, CBMZ   .res Assessment: Plan:   Discussed that she may try taking an additional 1/2 tab of Clonidine later in the day to improve acute anxiety.  Recommend continuing psychotherapy. Pt to f/u in 2 months or sooner if clinically indicated. Patient advised to contact office with any questions, adverse effects, or acute worsening in signs and symptoms.  Peggy RuizLeah was seen today for anxiety and follow-up.  Diagnoses and all orders for this visit:  Mild mood disorder (HCC) -     oxcarbazepine (TRILEPTAL) 600 MG tablet; Take 1 tablet (600 mg total) by mouth at bedtime. -     lurasidone (LATUDA) 80 MG TABS tablet; Take 1 tablet (80 mg total) by mouth daily with supper.  PTSD (post-traumatic stress disorder) -     cloNIDine (CATAPRES) 0.1 MG tablet; Take 1 tab po QHS and 1 qd prn -     chlordiazePOXIDE (LIBRIUM) 10 MG capsule; Take 1 po q am and 2 po QHS -     clonazePAM (KLONOPIN) 0.5 MG tablet; TAKE 1/2 TO 1 TABLET BY MOUTH IN THE MORNING AND TAKE 1 TABLET BY MOUTH AT BEDTIME  Generalized anxiety disorder -     cloNIDine (CATAPRES) 0.1 MG tablet; Take 1 tab po QHS and 1 qd prn -     chlordiazePOXIDE (LIBRIUM) 10 MG capsule; Take 1 po q am and 2 po QHS     Please see After Visit Summary for patient specific instructions.  Future Appointments  Date Time Provider Department Center  04/29/2019  1:00 PM Corie Chiquitoarter, Jameika Kinn, PMHNP [facility] None    No orders of the defined types were placed in this encounter.   -------------------------------

## 2019-04-29 ENCOUNTER — Ambulatory Visit (INDEPENDENT_AMBULATORY_CARE_PROVIDER_SITE_OTHER): Payer: PRIVATE HEALTH INSURANCE | Admitting: Psychiatry

## 2019-04-29 ENCOUNTER — Other Ambulatory Visit: Payer: Self-pay

## 2019-04-29 ENCOUNTER — Encounter: Payer: Self-pay | Admitting: Psychiatry

## 2019-04-29 VITALS — BP 124/85 | HR 83 | Wt 159.0 lb

## 2019-04-29 DIAGNOSIS — F5101 Primary insomnia: Secondary | ICD-10-CM

## 2019-04-29 DIAGNOSIS — F4310 Post-traumatic stress disorder, unspecified: Secondary | ICD-10-CM

## 2019-04-29 DIAGNOSIS — F411 Generalized anxiety disorder: Secondary | ICD-10-CM

## 2019-04-29 DIAGNOSIS — F39 Unspecified mood [affective] disorder: Secondary | ICD-10-CM | POA: Diagnosis not present

## 2019-04-29 NOTE — Progress Notes (Signed)
Peggy ShadowLeah C Consuegra [id number] 04/29/1986 33 y.o.  Subjective:   Patient ID:  Peggy Ruiz is a 33 y.o. (DOB 02/15/1986) female.  Chief Complaint:  Chief Complaint  Patient presents with  . Anxiety  . Depression    HPI Peggy Ruiz presents to the office today for follow-up of anxiety, mood and insomnia. She reports that she has had increased stress recently. Father has been dx'd with Stage 3 renal disease, pancreatitis, gastric ulcers, and elevated liver enzymes. Mother continues to have severe insomnia. She has been trying to help her parents and has been taking them to medical apts and procedures. She reports that her husband has also had depression and anxiety.   She reports that has gained some weight recently. (Weight is 8 lbs higher compared to 2 months ago). She reports that her appetite has decreased and describes early satiety. Reports that she often does not eat full servings at meals. She reports that she is eating an adequate amount overall. She reports that she has been having significant fatigue. She reports that she has been "much more emotional" over the last several months to a year. She reports that she is easily angered and irritated. She reports that she has been more tearful and crying easily. She reports frequent worry and rumination, such as trying to figure out what is causing her and her family's medical problems. Occ panic s/s and has some improvement with clonidine. Occ has tightness in her chest. Reports that she has been feeling lonely, which then causes her to feel sad. Reports that her husband has told her that her thinking is often negative. She reports occasional difficulty falling asleep. Reports that she has been questioning her purpose. Denies SI.   Continues to see therapist regularly. Has been trying to change coping skills. Has had difficulty meeting with people in the area where she has moved due to the pandemic.  Past Psychiatric Medication  Trials: Cymbalta Prozac Lexapro Paxil Effexor Nortriptyline-prescribed by GI for gastroparesis Remeron-caused insomnia Buspar- Adverse reaction Abilify-involuntary movements Zyprexa-effective for a period of time. Caused excessive weight gain at times. Seroquel Latuda-effective. Sleeping "harder" on 80 mg. Lamictal Gabapentin- Cannot tolerate doses higher than 800 mg BID Trileptal- Reports that she was unable to tolerate 900 mg QHS Belsomra-helpful with sleep quality but not sleep quantity. Klonopin Ativan Librium Clonidine- Effective Propranolol Trazodone Ambien Nuvigil Adderall  Review of Systems:  Review of Systems  Constitutional: Positive for appetite change and fatigue.  Gastrointestinal:       Denies any acute GI s/s. Reports early satiety  Musculoskeletal: Negative for gait problem.  Psychiatric/Behavioral:       Please refer to HPI    Reports that cortisol levels were recently elevated.   Medications: I have reviewed the patient's current medications.  Current Outpatient Medications  Medication Sig Dispense Refill  . chlordiazePOXIDE (LIBRIUM) 10 MG capsule Take 1 po q am and 2 po QHS 90 capsule 2  . clonazePAM (KLONOPIN) 0.5 MG tablet TAKE 1/2 TO 1 TABLET BY MOUTH IN THE MORNING AND TAKE 1 TABLET BY MOUTH AT BEDTIME 60 tablet 2  . cloNIDine (CATAPRES) 0.1 MG tablet Take 1 tab po QHS and 1 qd prn 60 tablet 2  . dexlansoprazole (DEXILANT) 60 MG capsule Take 60 mg by mouth every morning.    . gabapentin (NEURONTIN) 800 MG tablet Take 800 mg by mouth 2 (two) times daily.    Marland Kitchen. levothyroxine (SYNTHROID, LEVOTHROID) 50 MCG tablet Take 50 mcg by mouth  daily.    . Lifitegrast (XIIDRA) 5 % SOLN Apply to eye.    . magnesium 30 MG tablet Take 30 mg by mouth 2 (two) times daily.    . methocarbamol (ROBAXIN) 750 MG tablet Take 750 mg by mouth every 8 (eight) hours as needed.     . Multiple Vitamin (MULTIVITAMIN) tablet Take 1 tablet by mouth daily.    . nortriptyline  (PAMELOR) 10 MG capsule Take 10 mg by mouth at bedtime.    . ondansetron (ZOFRAN-ODT) 8 MG disintegrating tablet Take 8 mg by mouth every 8 (eight) hours as needed for nausea.    Marland Kitchen oxcarbazepine (TRILEPTAL) 600 MG tablet Take 1 tablet (600 mg total) by mouth at bedtime. 30 tablet 5  . Probiotic Product (PROBIOTIC DAILY PO) Take by mouth.    . levonorgestrel-ethinyl estradiol (SEASONALE,INTROVALE,JOLESSA) 0.15-0.03 MG tablet Take 1 tablet by mouth daily.    . Lurasidone HCl 120 MG TABS Take 1 tablet (120 mg total) by mouth daily with supper. 30 tablet 1   No current facility-administered medications for this visit.     Medication Side Effects: None  Allergies:  Allergies  Allergen Reactions  . Promethazine     Other reaction(s): Agitation  . Ciprofloxacin Other (See Comments)    c-diff  . Dilaudid [Hydromorphone]     sob  . Epinephrine   . Penicillins Hives    Has patient had a PCN reaction causing immediate rash, facial/tongue/throat swelling, SOB or lightheadedness with hypotension: Yes Has patient had a PCN reaction causing severe rash involving mucus membranes or skin necrosis: No Has patient had a PCN reaction that required hospitalization: No Has patient had a PCN reaction occurring within the last 10 years: Yes If all of the above answers are "NO", then may proceed with Cephalosporin use.   . Toradol [Ketorolac Tromethamine] Nausea And Vomiting    Past Medical History:  Diagnosis Date  . Anorexia    at 15  . Anxiety   . Attention deficit disorder (ADD)   . Bone spur   . Depression   . Eczema   . HPV (human papilloma virus) infection     Family History  Problem Relation Age of Onset  . Fibromyalgia Mother   . Anxiety disorder Mother   . Depression Mother   . Depression Father   . Anxiety disorder Father     Social History   Socioeconomic History  . Marital status: Single    Spouse name: Not on file  . Number of children: Not on file  . Years of  education: Not on file  . Highest education level: Not on file  Occupational History  . Not on file  Social Needs  . Financial resource strain: Not on file  . Food insecurity    Worry: Not on file    Inability: Not on file  . Transportation needs    Medical: Not on file    Non-medical: Not on file  Tobacco Use  . Smoking status: Never Smoker  . Smokeless tobacco: Never Used  Substance and Sexual Activity  . Alcohol use: Yes    Comment: occasion  . Drug use: No  . Sexual activity: Never  Lifestyle  . Physical activity    Days per week: Not on file    Minutes per session: Not on file  . Stress: Not on file  Relationships  . Social Musician on phone: Not on file    Gets together: Not on  file    Attends religious service: Not on file    Active member of club or organization: Not on file    Attends meetings of clubs or organizations: Not on file    Relationship status: Not on file  . Intimate partner violence    Fear of current or ex partner: Not on file    Emotionally abused: Not on file    Physically abused: Not on file    Forced sexual activity: Not on file  Other Topics Concern  . Not on file  Social History Narrative   Regular exercise: none   Caffeine use: daily    Past Medical History, Surgical history, Social history, and Family history were reviewed and updated as appropriate.   Please see review of systems for further details on the patient's review from today.   Objective:   Physical Exam:  BP 124/85   Pulse 83   Wt 159 lb (72.1 kg)   BMI 23.48 kg/m   Physical Exam Constitutional:      General: She is not in acute distress.    Appearance: She is well-developed.  Musculoskeletal:        General: No deformity.  Neurological:     Mental Status: She is alert and oriented to person, place, and time.     Coordination: Coordination normal.  Psychiatric:        Attention and Perception: Attention and perception normal. She does not  perceive auditory or visual hallucinations.        Mood and Affect: Mood is anxious and depressed. Affect is not labile, blunt, angry or inappropriate.        Speech: Speech normal.        Behavior: Behavior normal.        Thought Content: Thought content normal. Thought content is not paranoid or delusional. Thought content does not include homicidal or suicidal ideation. Thought content does not include homicidal or suicidal plan.        Cognition and Memory: Cognition and memory normal.        Judgment: Judgment normal.     Comments: Insight intact. .      Lab Review:     Component Value Date/Time   NA 139 06/26/2017 0025   NA 138 08/31/2011 1624   K 3.7 06/26/2017 0025   K 4.1 08/31/2011 1624   CL 105 06/26/2017 0025   CL 102 08/31/2011 1624   CO2 25 06/26/2017 0025   CO2 27 08/31/2011 1624   GLUCOSE 110 (H) 06/26/2017 0025   GLUCOSE 81 08/31/2011 1624   BUN 9 06/26/2017 0025   BUN 10 08/31/2011 1624   CREATININE 0.88 06/26/2017 0025   CREATININE 0.88 08/31/2011 1624   CALCIUM 9.1 06/26/2017 0025   CALCIUM 9.1 08/31/2011 1624   PROT 7.4 06/26/2017 0025   PROT 7.8 08/31/2011 1624   ALBUMIN 4.5 06/26/2017 0025   ALBUMIN 4.0 08/31/2011 1624   AST 19 06/26/2017 0025   AST 25 08/31/2011 1624   ALT 14 06/26/2017 0025   ALT 24 08/31/2011 1624   ALKPHOS 49 06/26/2017 0025   ALKPHOS 52 08/31/2011 1624   BILITOT 0.5 06/26/2017 0025   BILITOT 0.3 08/31/2011 1624   GFRNONAA >60 06/26/2017 0025   GFRNONAA >60 08/31/2011 1624   GFRAA >60 06/26/2017 0025   GFRAA >60 08/31/2011 1624       Component Value Date/Time   WBC 6.4 06/26/2017 0025   RBC 3.85 06/26/2017 0025   HGB 12.1 06/26/2017 0025  HGB 12.7 08/31/2011 1624   HCT 34.8 (L) 06/26/2017 0025   HCT 37.7 08/31/2011 1624   PLT 246 06/26/2017 0025   PLT 248 08/31/2011 1624   MCV 90.3 06/26/2017 0025   MCV 90 08/31/2011 1624   MCH 31.5 06/26/2017 0025   MCHC 34.9 06/26/2017 0025   RDW 11.9 06/26/2017 0025   RDW  12.6 08/31/2011 1624   LYMPHSABS 2.2 04/07/2017 1145   MONOABS 0.4 04/07/2017 1145   EOSABS 0.1 04/07/2017 1145   BASOSABS 0.0 04/07/2017 1145    No results found for: POCLITH, LITHIUM   No results found for: PHENYTOIN, PHENOBARB, VALPROATE, CBMZ   .res Assessment: Plan:   Case staffed with Dr. Jennelle Human. Discussed potential benefits, risks, and side effects of increasing Latuda to 120 mg po qd to improve mood and anxiety s/s since Jordan has been most effective for these s/s and better tolerated than other agents.  Will continue all other medications as prescribed.  Recommend continuing psychotherapy.  Recommend f/u in 4 weeks or sooner if clinically indicated.   Patient advised to contact office with any questions, adverse effects, or acute worsening in signs and symptoms.  Gavriela was seen today for anxiety and depression.  Diagnoses and all orders for this visit:  Mild mood disorder (HCC) -     Lurasidone HCl 120 MG TABS; Take 1 tablet (120 mg total) by mouth daily with supper.  PTSD (post-traumatic stress disorder)  Generalized anxiety disorder  Primary insomnia     Please see After Visit Summary for patient specific instructions.  No future appointments.  No orders of the defined types were placed in this encounter.   -------------------------------

## 2019-04-30 MED ORDER — LURASIDONE HCL 120 MG PO TABS: 120.0000 mg | ORAL_TABLET | Freq: Every day | ORAL | 1 refills | Status: DC

## 2019-05-20 ENCOUNTER — Telehealth: Payer: Self-pay | Admitting: Psychiatry

## 2019-05-20 ENCOUNTER — Other Ambulatory Visit: Payer: Self-pay

## 2019-05-20 DIAGNOSIS — F4310 Post-traumatic stress disorder, unspecified: Secondary | ICD-10-CM

## 2019-05-20 DIAGNOSIS — F411 Generalized anxiety disorder: Secondary | ICD-10-CM

## 2019-05-20 MED ORDER — CHLORDIAZEPOXIDE HCL 10 MG PO CAPS: ORAL_CAPSULE | ORAL | 2 refills | Status: DC

## 2019-05-20 NOTE — Telephone Encounter (Signed)
Updated new pharmacy on patient's profile. No requests had been received. Pended for approval

## 2019-05-20 NOTE — Telephone Encounter (Signed)
Nandita called to request refill of her Librium. She has changed pharmacy.  Now please sent to [facility] on [address] [facility]  Appt 12/2.  Pharmacy said they had sent request yesterday and today, but I don't see.  Please send for her.

## 2019-05-29 ENCOUNTER — Ambulatory Visit (INDEPENDENT_AMBULATORY_CARE_PROVIDER_SITE_OTHER): Payer: BC Managed Care – PPO | Admitting: Psychiatry

## 2019-05-29 ENCOUNTER — Other Ambulatory Visit: Payer: Self-pay

## 2019-05-29 ENCOUNTER — Encounter: Payer: Self-pay | Admitting: Psychiatry

## 2019-05-29 DIAGNOSIS — F4310 Post-traumatic stress disorder, unspecified: Secondary | ICD-10-CM

## 2019-05-29 DIAGNOSIS — F411 Generalized anxiety disorder: Secondary | ICD-10-CM | POA: Diagnosis not present

## 2019-05-29 DIAGNOSIS — F39 Unspecified mood [affective] disorder: Secondary | ICD-10-CM | POA: Diagnosis not present

## 2019-05-29 MED ORDER — OXCARBAZEPINE 600 MG PO TABS: 600.0000 mg | ORAL_TABLET | Freq: Every day | ORAL | 1 refills | Status: DC

## 2019-05-29 MED ORDER — LURASIDONE HCL 120 MG PO TABS: 120.0000 mg | ORAL_TABLET | Freq: Every day | ORAL | 1 refills | Status: DC

## 2019-05-29 MED ORDER — CLONIDINE HCL 0.1 MG PO TABS: ORAL_TABLET | ORAL | 0 refills | Status: DC

## 2019-05-29 MED ORDER — CLONAZEPAM 0.5 MG PO TABS: ORAL_TABLET | ORAL | 2 refills | Status: DC

## 2019-05-29 NOTE — Progress Notes (Signed)
Peggy Ruiz [id number] [birth year] 33 y.o.  Subjective:   Patient ID:  Peggy Ruiz is a 33 y.o. (DOB September 15, 1985) female.  Chief Complaint:  Chief Complaint  Patient presents with  . Anxiety  . Depression    HPI Peggy Ruiz presents to the office today for follow-up of mood, anxiety, and insomnia. She reports that it is difficult to determine response to increase in Jordan due to extrernal circumstances. She reports that her husband worked 10-13 hour days for 10 consecutive days and this is the longer she has been alone and not seen him. She reports that she was crying excessively during that time and felt very lonely. She reports that he has been off for a whole week and her mood has improved. Had severe anxiety with husband working longer hours and anxiety has been significantly less this past week while husband has been off work. "I'm at peace." Energy and motivation have been ok. Concentration has been good. Has been able to read again. Has read 6 books over the last few months. Denies SI.   She reports that she has nights she sleeps well and other nights of restlessness. Has not noticed a significant change in sleep with increase in Jordan. She reports that she continues to hbe unable to finish her dinner. Wt. Is stable.   Reports that she has been trying to take 1/2 of her meds at supper time and then 1/2 with evening snack. Has been taking Latuda with her evening snack closer to bed time and this has helped with her sleep.   Past Psychiatric Medication Trials: Cymbalta Prozac Lexapro Paxil Effexor Nortriptyline-prescribed by GI for gastroparesis Remeron-caused insomnia Buspar- Adverse reaction Abilify-involuntary movements Zyprexa-effective for a period of time. Caused excessive weight gain at times. Seroquel Latuda-effective. Sleeping "harder" on 80 mg. Lamictal Gabapentin- Cannot tolerate doses higher than 800 mg BID Trileptal- Reports that she was unable to tolerate  900 mg QHS Belsomra-helpful with sleep quality but not sleep quantity. Klonopin Ativan Librium Clonidine- Effective Propranolol Trazodone Ambien Nuvigil Adderall   Review of Systems:  Review of Systems  Gastrointestinal:       Reports that she was told she has SIBO  Musculoskeletal: Positive for back pain. Negative for gait problem.  Neurological: Negative for tremors.  Psychiatric/Behavioral:       Please refer to HPI   Reports that she has been seeing an integrative health provider in [place] and has had extensive testing. Reports that she will be starting supplements.   Medications: I have reviewed the patient's current medications.  Current Outpatient Medications  Medication Sig Dispense Refill  . chlordiazePOXIDE (LIBRIUM) 10 MG capsule Take 1 po q am and 2 po QHS 90 capsule 2  . [START ON 06/03/2019] clonazePAM (KLONOPIN) 0.5 MG tablet TAKE 1/2 TO 1 TABLET BY MOUTH IN THE MORNING AND TAKE 1 TABLET BY MOUTH AT BEDTIME 60 tablet 2  . cloNIDine (CATAPRES) 0.1 MG tablet Take 1 tab po QHS and 1 qd prn 180 tablet 0  . dexlansoprazole (DEXILANT) 60 MG capsule Take 60 mg by mouth every morning.    . gabapentin (NEURONTIN) 800 MG tablet Take 800 mg by mouth 2 (two) times daily.    Marland Kitchen levonorgestrel-ethinyl estradiol (SEASONALE,INTROVALE,JOLESSA) 0.15-0.03 MG tablet Take 1 tablet by mouth daily.    Marland Kitchen levothyroxine (SYNTHROID, LEVOTHROID) 50 MCG tablet Take 50 mcg by mouth daily.    Marland Kitchen Lifitegrast (XIIDRA) 5 % SOLN Apply to eye.    . Lurasidone HCl  120 MG TABS Take 1 tablet (120 mg total) by mouth daily with supper. 30 tablet 1  . magnesium 30 MG tablet Take 30 mg by mouth 2 (two) times daily.    . methocarbamol (ROBAXIN) 750 MG tablet Take 750 mg by mouth every 8 (eight) hours as needed.     . Multiple Vitamin (MULTIVITAMIN) tablet Take 1 tablet by mouth daily.    . nortriptyline (PAMELOR) 10 MG capsule Take 10 mg by mouth at bedtime.    . ondansetron (ZOFRAN-ODT) 8 MG  disintegrating tablet Take 8 mg by mouth every 8 (eight) hours as needed for nausea.    Marland Kitchen. oxcarbazepine (TRILEPTAL) 600 MG tablet Take 1 tablet (600 mg total) by mouth at bedtime. 90 tablet 1  . Probiotic Product (PROBIOTIC DAILY PO) Take by mouth.     No current facility-administered medications for this visit.     Medication Side Effects: None  Allergies:  Allergies  Allergen Reactions  . Promethazine     Other reaction(s): Agitation  . Ciprofloxacin Other (See Comments)    c-diff  . Dilaudid [Hydromorphone]     sob  . Epinephrine   . Penicillins Hives    Has patient had a PCN reaction causing immediate rash, facial/tongue/throat swelling, SOB or lightheadedness with hypotension: Yes Has patient had a PCN reaction causing severe rash involving mucus membranes or skin necrosis: No Has patient had a PCN reaction that required hospitalization: No Has patient had a PCN reaction occurring within the last 10 years: Yes If all of the above answers are "NO", then may proceed with Cephalosporin use.   . Toradol [Ketorolac Tromethamine] Nausea And Vomiting    Past Medical History:  Diagnosis Date  . Anorexia    at 15  . Anxiety   . Attention deficit disorder (ADD)   . Bone spur   . Depression   . Eczema   . HPV (human papilloma virus) infection     Family History  Problem Relation Age of Onset  . Fibromyalgia Mother   . Anxiety disorder Mother   . Depression Mother   . Depression Father   . Anxiety disorder Father     Social History   Socioeconomic History  . Marital status: Single    Spouse name: Not on file  . Number of children: Not on file  . Years of education: Not on file  . Highest education level: Not on file  Occupational History  . Not on file  Social Needs  . Financial resource strain: Not on file  . Food insecurity    Worry: Not on file    Inability: Not on file  . Transportation needs    Medical: Not on file    Non-medical: Not on file  Tobacco  Use  . Smoking status: Never Smoker  . Smokeless tobacco: Never Used  Substance and Sexual Activity  . Alcohol use: Yes    Comment: occasion  . Drug use: No  . Sexual activity: Never  Lifestyle  . Physical activity    Days per week: Not on file    Minutes per session: Not on file  . Stress: Not on file  Relationships  . Social Musicianconnections    Talks on phone: Not on file    Gets together: Not on file    Attends religious service: Not on file    Active member of club or organization: Not on file    Attends meetings of clubs or organizations: Not on file  Relationship status: Not on file  . Intimate partner violence    Fear of current or ex partner: Not on file    Emotionally abused: Not on file    Physically abused: Not on file    Forced sexual activity: Not on file  Other Topics Concern  . Not on file  Social History Narrative   Regular exercise: none   Caffeine use: daily    Past Medical History, Surgical history, Social history, and Family history were reviewed and updated as appropriate.   Please see review of systems for further details on the patient's review from today.   Objective:   Physical Exam:  BP 123/78   Pulse 77   Wt 160 lb (72.6 kg)   BMI 23.63 kg/m   Physical Exam Constitutional:      General: She is not in acute distress.    Appearance: She is well-developed.  Musculoskeletal:        General: No deformity.  Neurological:     Mental Status: She is alert and oriented to person, place, and time.     Coordination: Coordination normal.  Psychiatric:        Attention and Perception: Attention and perception normal. She does not perceive auditory or visual hallucinations.        Mood and Affect: Mood normal. Mood is not anxious or depressed. Affect is not labile, blunt, angry or inappropriate.        Speech: Speech normal.        Behavior: Behavior normal.        Thought Content: Thought content normal. Thought content does not include homicidal  or suicidal ideation. Thought content does not include homicidal or suicidal plan.        Cognition and Memory: Cognition and memory normal.        Judgment: Judgment normal.     Comments: Insight intact. No delusions.      Lab Review:     Component Value Date/Time   NA 139 06/26/2017 0025   NA 138 08/31/2011 1624   K 3.7 06/26/2017 0025   K 4.1 08/31/2011 1624   CL 105 06/26/2017 0025   CL 102 08/31/2011 1624   CO2 25 06/26/2017 0025   CO2 27 08/31/2011 1624   GLUCOSE 110 (H) 06/26/2017 0025   GLUCOSE 81 08/31/2011 1624   BUN 9 06/26/2017 0025   BUN 10 08/31/2011 1624   CREATININE 0.88 06/26/2017 0025   CREATININE 0.88 08/31/2011 1624   CALCIUM 9.1 06/26/2017 0025   CALCIUM 9.1 08/31/2011 1624   PROT 7.4 06/26/2017 0025   PROT 7.8 08/31/2011 1624   ALBUMIN 4.5 06/26/2017 0025   ALBUMIN 4.0 08/31/2011 1624   AST 19 06/26/2017 0025   AST 25 08/31/2011 1624   ALT 14 06/26/2017 0025   ALT 24 08/31/2011 1624   ALKPHOS 49 06/26/2017 0025   ALKPHOS 52 08/31/2011 1624   BILITOT 0.5 06/26/2017 0025   BILITOT 0.3 08/31/2011 1624   GFRNONAA >60 06/26/2017 0025   GFRNONAA >60 08/31/2011 1624   GFRAA >60 06/26/2017 0025   GFRAA >60 08/31/2011 1624       Component Value Date/Time   WBC 6.4 06/26/2017 0025   RBC 3.85 06/26/2017 0025   HGB 12.1 06/26/2017 0025   HGB 12.7 08/31/2011 1624   HCT 34.8 (L) 06/26/2017 0025   HCT 37.7 08/31/2011 1624   PLT 246 06/26/2017 0025   PLT 248 08/31/2011 1624   MCV 90.3 06/26/2017 0025   MCV  90 08/31/2011 1624   MCH 31.5 06/26/2017 0025   MCHC 34.9 06/26/2017 0025   RDW 11.9 06/26/2017 0025   RDW 12.6 08/31/2011 1624   LYMPHSABS 2.2 04/07/2017 1145   MONOABS 0.4 04/07/2017 1145   EOSABS 0.1 04/07/2017 1145   BASOSABS 0.0 04/07/2017 1145    No results found for: POCLITH, LITHIUM   No results found for: PHENYTOIN, PHENOBARB, VALPROATE, CBMZ   .res Assessment: Plan:   Discussed continuing current medications since more time is  needed to determine response to recent medication changes since psychosocial factors may have caused those improved and worsening mood signs and symptoms at times.  Will continue Klonopin and Librium for anxiety. Continue clonidine for anxiety. Continue Trileptal 600 mg at bedtime for mood stabilization and anxiety. Discussed trauma signs and symptoms and different types of treatment options. Patient to follow-up in 4 weeks or sooner if clinically indicated. Patient advised to contact office with any questions, adverse effects, or acute worsening in signs and symptoms.  Peggy Ruiz was seen today for anxiety and depression.  Diagnoses and all orders for this visit:  PTSD (post-traumatic stress disorder) -     clonazePAM (KLONOPIN) 0.5 MG tablet; TAKE 1/2 TO 1 TABLET BY MOUTH IN THE MORNING AND TAKE 1 TABLET BY MOUTH AT BEDTIME -     cloNIDine (CATAPRES) 0.1 MG tablet; Take 1 tab po QHS and 1 qd prn  Generalized anxiety disorder -     cloNIDine (CATAPRES) 0.1 MG tablet; Take 1 tab po QHS and 1 qd prn  Mild mood disorder (HCC) -     Lurasidone HCl 120 MG TABS; Take 1 tablet (120 mg total) by mouth daily with supper. -     oxcarbazepine (TRILEPTAL) 600 MG tablet; Take 1 tablet (600 mg total) by mouth at bedtime.     Please see After Visit Summary for patient specific instructions.  Future Appointments  Date Time Provider Department Center  07/03/2019  1:15 PM Corie Chiquito, PMHNP [facility] None    No orders of the defined types were placed in this encounter.   -------------------------------

## 2019-07-03 ENCOUNTER — Encounter: Payer: Self-pay | Admitting: Psychiatry

## 2019-07-03 ENCOUNTER — Ambulatory Visit (INDEPENDENT_AMBULATORY_CARE_PROVIDER_SITE_OTHER): Payer: PRIVATE HEALTH INSURANCE | Admitting: Psychiatry

## 2019-07-03 DIAGNOSIS — F39 Unspecified mood [affective] disorder: Secondary | ICD-10-CM

## 2019-07-03 DIAGNOSIS — G4700 Insomnia, unspecified: Secondary | ICD-10-CM

## 2019-07-03 DIAGNOSIS — F411 Generalized anxiety disorder: Secondary | ICD-10-CM | POA: Diagnosis not present

## 2019-07-03 DIAGNOSIS — F4310 Post-traumatic stress disorder, unspecified: Secondary | ICD-10-CM

## 2019-07-03 MED ORDER — LURASIDONE HCL 80 MG PO TABS: 80.0000 mg | ORAL_TABLET | Freq: Every day | ORAL | 2 refills | Status: DC

## 2019-07-03 NOTE — Progress Notes (Signed)
Peggy Ruiz [id number] [birth year] 34 y.o.  Virtual Visit via Telephone Note  I connected with pt on 07/02/18 at  1:15 PM EST by telephone and verified that I am speaking with the correct person using two identifiers.   I discussed the limitations, risks, security and privacy concerns of performing an evaluation and management service by telephone and the availability of in person appointments. I also discussed with the patient that there may be a patient responsible charge related to this service. The patient expressed understanding and agreed to proceed.   I discussed the assessment and treatment plan with the patient. The patient was provided an opportunity to ask questions and all were answered. The patient agreed with the plan and demonstrated an understanding of the instructions.   The patient was advised to call back or seek an in-person evaluation if the symptoms worsen or if the condition fails to improve as anticipated.  I provided 35 minutes of non-face-to-face time during this encounter.  The patient was located at home.  The provider was located at [facility].   Corie Chiquito, PMHNP   Subjective:   Patient ID:  Peggy Ruiz is a 34 y.o. (DOB 10-Dec-1985) female.  Chief Complaint:  Chief Complaint  Patient presents with  . Follow-up    Anxiety, Depression, Insomnia    HPI Peggy Ruiz presents for follow-up of anxiety, depression, and insomnia. Pt requested telehealth visit due to COVID exposure and current COVID s/s. Reports that her energy is very low with acute illness. She reports that her anxiety remains "high. I have good days and bad days." Denies any significant changes in mood or anxiety. She reports that her sleep cycles between adequate to multiple awakenings a night. She reports that the last few nights she has been increasing active in her sleep and acting out her dreams. Has noticed some increase in hypervigilance and repeatedly checking locks.  Reports exaggerated startle response. Intrusive memories. Some re-experiencing of emotions associated with traumatic events. She reports that she has been having panic attacks- "but not as bad as before I went on the Clonidine." She reports that panic attacks are both triggered and not triggered. She reports that she has used multiple grounding techniques with limited improvement. Reports panic and crying episodes at least 4 times a week. Appetite has been the same in the morning and at lunch. Appetite has been fluctuating in the evenings. Denies SI.   Reports that she is currently unsure which meds are effective and ineffective for her.   Past Psychiatric Medication Trials: Cymbalta Prozac Lexapro Paxil Effexor Nortriptyline-prescribed by GI for gastroparesis Remeron-caused insomnia Buspar- Adverse reaction Abilify-involuntary movements Zyprexa-effective for a period of time. Caused excessive weight gain at times. Seroquel Latuda-effective. Sleeping "harder" on 80 mg. Lamictal Gabapentin- Cannot tolerate doses higher than 800 mg BID Trileptal- Reports that she was unable to tolerate 900 mg QHS Belsomra-helpful with sleep quality but not sleep quantity. Klonopin Ativan Librium Clonidine- Effective Propranolol Trazodone Ambien Nuvigil Adderall  Review of Systems:  Review of Systems  Constitutional: Positive for fatigue. Negative for fever.  HENT: Positive for congestion, rhinorrhea and voice change.   Respiratory: Positive for chest tightness. Negative for cough.   Gastrointestinal: Positive for nausea.  Musculoskeletal: Positive for arthralgias, back pain and myalgias. Negative for gait problem.  Neurological: Positive for headaches.  Psychiatric/Behavioral:       Please refer to HPI    Medications: I have reviewed the patient's current medications.  Current Outpatient Medications  Medication Sig Dispense Refill  . chlordiazePOXIDE (LIBRIUM) 10 MG capsule Take 1 po q  am and 2 po QHS 90 capsule 2  . clonazePAM (KLONOPIN) 0.5 MG tablet TAKE 1/2 TO 1 TABLET BY MOUTH IN THE MORNING AND TAKE 1 TABLET BY MOUTH AT BEDTIME 60 tablet 2  . cloNIDine (CATAPRES) 0.1 MG tablet Take 1 tab po QHS and 1 qd prn 180 tablet 0  . dexlansoprazole (DEXILANT) 60 MG capsule Take 60 mg by mouth every morning.    . gabapentin (NEURONTIN) 800 MG tablet Take 800 mg by mouth 2 (two) times daily.    Marland Kitchen levothyroxine (SYNTHROID, LEVOTHROID) 50 MCG tablet Take 50 mcg by mouth daily.    Marland Kitchen Lifitegrast (XIIDRA) 5 % SOLN Apply to eye.    . magnesium 30 MG tablet Take 30 mg by mouth 2 (two) times daily.    . methocarbamol (ROBAXIN) 750 MG tablet Take 750 mg by mouth every 8 (eight) hours as needed.     . Multiple Vitamin (MULTIVITAMIN) tablet Take 1 tablet by mouth daily.    . nortriptyline (PAMELOR) 10 MG capsule Take 10 mg by mouth at bedtime.    . ondansetron (ZOFRAN-ODT) 8 MG disintegrating tablet Take 8 mg by mouth every 8 (eight) hours as needed for nausea.    Marland Kitchen oxcarbazepine (TRILEPTAL) 600 MG tablet Take 1 tablet (600 mg total) by mouth at bedtime. 90 tablet 1  . Probiotic Product (PROBIOTIC DAILY PO) Take by mouth.    . levonorgestrel-ethinyl estradiol (SEASONALE,INTROVALE,JOLESSA) 0.15-0.03 MG tablet Take 1 tablet by mouth daily.    Marland Kitchen lurasidone (LATUDA) 80 MG TABS tablet Take 1 tablet (80 mg total) by mouth daily with supper. 30 tablet 2  . Lurasidone HCl 120 MG TABS Take 1 tablet (120 mg total) by mouth daily with supper. 30 tablet 1   No current facility-administered medications for this visit.    Medication Side Effects: None  Allergies:  Allergies  Allergen Reactions  . Promethazine     Other reaction(s): Agitation  . Ciprofloxacin Other (See Comments)    c-diff  . Dilaudid [Hydromorphone]     sob  . Epinephrine   . Penicillins Hives    Has patient had a PCN reaction causing immediate rash, facial/tongue/throat swelling, SOB or lightheadedness with hypotension:  Yes Has patient had a PCN reaction causing severe rash involving mucus membranes or skin necrosis: No Has patient had a PCN reaction that required hospitalization: No Has patient had a PCN reaction occurring within the last 10 years: Yes If all of the above answers are "NO", then may proceed with Cephalosporin use.   . Toradol [Ketorolac Tromethamine] Nausea And Vomiting    Past Medical History:  Diagnosis Date  . Anorexia    at 15  . Anxiety   . Attention deficit disorder (ADD)   . Bone spur   . Depression   . Eczema   . HPV (human papilloma virus) infection     Family History  Problem Relation Age of Onset  . Fibromyalgia Mother   . Anxiety disorder Mother   . Depression Mother   . Depression Father   . Anxiety disorder Father     Social History   Socioeconomic History  . Marital status: Single    Spouse name: Not on file  . Number of children: Not on file  . Years of education: Not on file  . Highest education level: Not on file  Occupational History  . Not on file  Tobacco Use  . Smoking status: Never Smoker  . Smokeless tobacco: Never Used  Substance and Sexual Activity  . Alcohol use: Yes    Comment: occasion  . Drug use: No  . Sexual activity: Never  Other Topics Concern  . Not on file  Social History Narrative   Regular exercise: none   Caffeine use: daily   Social Determinants of Health   Financial Resource Strain:   . Difficulty of Paying Living Expenses: Not on file  Food Insecurity:   . Worried About Charity fundraiser in the Last Year: Not on file  . Ran Out of Food in the Last Year: Not on file  Transportation Needs:   . Lack of Transportation (Medical): Not on file  . Lack of Transportation (Non-Medical): Not on file  Physical Activity:   . Days of Exercise per Week: Not on file  . Minutes of Exercise per Session: Not on file  Stress:   . Feeling of Stress : Not on file  Social Connections:   . Frequency of Communication with  Friends and Family: Not on file  . Frequency of Social Gatherings with Friends and Family: Not on file  . Attends Religious Services: Not on file  . Active Member of Clubs or Organizations: Not on file  . Attends Archivist Meetings: Not on file  . Marital Status: Not on file  Intimate Partner Violence:   . Fear of Current or Ex-Partner: Not on file  . Emotionally Abused: Not on file  . Physically Abused: Not on file  . Sexually Abused: Not on file    Past Medical History, Surgical history, Social history, and Family history were reviewed and updated as appropriate.   Please see review of systems for further details on the patient's review from today.   Objective:   Physical Exam:  There were no vitals taken for this visit.  Physical Exam Neurological:     Mental Status: She is alert and oriented to person, place, and time.     Cranial Nerves: No dysarthria.  Psychiatric:        Attention and Perception: Attention and perception normal.        Mood and Affect: Mood is anxious and depressed.        Speech: Speech normal.        Behavior: Behavior is cooperative.        Thought Content: Thought content normal. Thought content is not paranoid or delusional. Thought content does not include homicidal or suicidal ideation. Thought content does not include homicidal or suicidal plan.        Cognition and Memory: Cognition and memory normal.        Judgment: Judgment normal.     Comments: Insight intact     Lab Review:     Component Value Date/Time   NA 139 06/26/2017 0025   NA 138 08/31/2011 1624   K 3.7 06/26/2017 0025   K 4.1 08/31/2011 1624   CL 105 06/26/2017 0025   CL 102 08/31/2011 1624   CO2 25 06/26/2017 0025   CO2 27 08/31/2011 1624   GLUCOSE 110 (H) 06/26/2017 0025   GLUCOSE 81 08/31/2011 1624   BUN 9 06/26/2017 0025   BUN 10 08/31/2011 1624   CREATININE 0.88 06/26/2017 0025   CREATININE 0.88 08/31/2011 1624   CALCIUM 9.1 06/26/2017 0025   CALCIUM  9.1 08/31/2011 1624   PROT 7.4 06/26/2017 0025   PROT 7.8 08/31/2011 1624  ALBUMIN 4.5 06/26/2017 0025   ALBUMIN 4.0 08/31/2011 1624   AST 19 06/26/2017 0025   AST 25 08/31/2011 1624   ALT 14 06/26/2017 0025   ALT 24 08/31/2011 1624   ALKPHOS 49 06/26/2017 0025   ALKPHOS 52 08/31/2011 1624   BILITOT 0.5 06/26/2017 0025   BILITOT 0.3 08/31/2011 1624   GFRNONAA >60 06/26/2017 0025   GFRNONAA >60 08/31/2011 1624   GFRAA >60 06/26/2017 0025   GFRAA >60 08/31/2011 1624       Component Value Date/Time   WBC 6.4 06/26/2017 0025   RBC 3.85 06/26/2017 0025   HGB 12.1 06/26/2017 0025   HGB 12.7 08/31/2011 1624   HCT 34.8 (L) 06/26/2017 0025   HCT 37.7 08/31/2011 1624   PLT 246 06/26/2017 0025   PLT 248 08/31/2011 1624   MCV 90.3 06/26/2017 0025   MCV 90 08/31/2011 1624   MCH 31.5 06/26/2017 0025   MCHC 34.9 06/26/2017 0025   RDW 11.9 06/26/2017 0025   RDW 12.6 08/31/2011 1624   LYMPHSABS 2.2 04/07/2017 1145   MONOABS 0.4 04/07/2017 1145   EOSABS 0.1 04/07/2017 1145   BASOSABS 0.0 04/07/2017 1145    No results found for: POCLITH, LITHIUM   No results found for: PHENYTOIN, PHENOBARB, VALPROATE, CBMZ   .res Assessment: Plan:   30 minutes spent counseling pt and coordination of care to include sending fax to pt's GI specialist re: possible increase in Nortriptyline since this was initiated by Dr. Donivan Scull for pt's GI s/s. Discussed that higher doses of Nortriptyline may be helpful for her mood, anxiety, and insomnia. Discussed gradual increases in dose to determine effect on both GI and psychiatric s/s at each and since pt has had adverse effects with multiple psychiatric medications.  Will decrease Latuda to 80 mg po qd since there has not been any considerable benefit with increase to 120 mg po qd. Will continue all other medications as prescribed.  Will also consult with therapist in office to discuss possibility of starting EMDR since pt is interested in starting EMDR. Pt to  f/u in approximately 4 weeks or sooner if clinically appropriate.  Patient advised to contact office with any questions, adverse effects, or acute worsening in signs and symptoms.  Giabella was seen today for follow-up.  Diagnoses and all orders for this visit:  PTSD (post-traumatic stress disorder)  Mild mood disorder (HCC) -     lurasidone (LATUDA) 80 MG TABS tablet; Take 1 tablet (80 mg total) by mouth daily with supper.  Generalized anxiety disorder  Insomnia, unspecified type    Please see After Visit Summary for patient specific instructions.  No future appointments.  No orders of the defined types were placed in this encounter.     -------------------------------

## 2019-07-24 ENCOUNTER — Telehealth: Payer: Self-pay | Admitting: Psychiatry

## 2019-07-24 DIAGNOSIS — F4310 Post-traumatic stress disorder, unspecified: Secondary | ICD-10-CM

## 2019-07-24 DIAGNOSIS — F39 Unspecified mood [affective] disorder: Secondary | ICD-10-CM

## 2019-07-24 MED ORDER — NORTRIPTYLINE HCL 25 MG PO CAPS: 25.0000 mg | ORAL_CAPSULE | Freq: Every day | ORAL | 1 refills | Status: DC

## 2019-07-24 NOTE — Telephone Encounter (Signed)
Called pt to discuss response from GI specialist indicating that increase in nortriptyline to 25 mg po qhs for anxiety would be appropriate and would not interfere with underlying digestive disorders.  Informed patient that prescription for nortriptyline 25 mg daily was sent to pharmacy and provider would notify GI specialist that medication had been increased.

## 2019-08-12 ENCOUNTER — Telehealth: Payer: Self-pay | Admitting: Psychiatry

## 2019-08-12 NOTE — Telephone Encounter (Signed)
Patient called and said that the nortriptylene that [place] increased is not working. She is having a hard time falling asleep and then she is waking up around 4:30 am. She says it takes her 45 minutes to fall asleep. She wants to know can she take it in the morning instead of the evening. Please give her a call back at [phone]

## 2019-08-13 NOTE — Telephone Encounter (Signed)
Patient advised to take Nortriptyline in the morning and call back if insomnia not improving.

## 2019-08-21 ENCOUNTER — Ambulatory Visit (INDEPENDENT_AMBULATORY_CARE_PROVIDER_SITE_OTHER): Payer: PRIVATE HEALTH INSURANCE | Admitting: Psychiatry

## 2019-08-21 ENCOUNTER — Encounter: Payer: Self-pay | Admitting: Psychiatry

## 2019-08-21 ENCOUNTER — Other Ambulatory Visit: Payer: Self-pay

## 2019-08-21 DIAGNOSIS — F4310 Post-traumatic stress disorder, unspecified: Secondary | ICD-10-CM

## 2019-08-21 DIAGNOSIS — F411 Generalized anxiety disorder: Secondary | ICD-10-CM

## 2019-08-21 DIAGNOSIS — F39 Unspecified mood [affective] disorder: Secondary | ICD-10-CM

## 2019-08-21 MED ORDER — CLONAZEPAM 0.5 MG PO TABS: ORAL_TABLET | ORAL | 2 refills | Status: DC

## 2019-08-21 MED ORDER — NORTRIPTYLINE HCL 50 MG PO CAPS: 50.0000 mg | ORAL_CAPSULE | Freq: Every day | ORAL | 1 refills | Status: DC

## 2019-08-21 MED ORDER — CHLORDIAZEPOXIDE HCL 10 MG PO CAPS: ORAL_CAPSULE | ORAL | 2 refills | Status: DC

## 2019-08-21 MED ORDER — LURASIDONE HCL 80 MG PO TABS: 80.0000 mg | ORAL_TABLET | Freq: Every day | ORAL | 2 refills | Status: DC

## 2019-08-21 MED ORDER — CLONIDINE HCL 0.1 MG PO TABS: ORAL_TABLET | ORAL | 0 refills | Status: DC

## 2019-08-21 NOTE — Progress Notes (Signed)
Peggy Ruiz [id number] 1985/11/26 34 y.o.  Subjective:   Patient ID:  Peggy Ruiz is a 34 y.o. (DOB [birth year]) female.  Chief Complaint:  Chief Complaint  Patient presents with  . Anxiety  . Insomnia  . Depression    HPI Peggy Ruiz presents to the office today for follow-up of mood, anxiety, and insomnia. Started increase in Nortriptyline one month ago and reports that she had early morning awakening around 4 am. She then called the office and asked if she could take it in the morning. She reports that the first few nights her sleep was more restless. She reports that she has slept significantly better the last 5 nights without awakening. Reports that she awakens with more energy. "I don't think I have ever slept so well." Energy has been better during the daytime. Has not needed to take naps any longer. Motivation has been better. She continues to feel sad and having crying episodes. Reports that she has been having stronger reactions to things than she would expect. Occ frustration and easily irritated by certain things. She denies any significant changes in mood or anxiety with increase Nortriptyline. Appetite is unchanged. App is inconsistent for dinner. Concentration has been good and has been reading books again. Denies SI.   Reports that there have been different triggers that have reminded her of past traumatic events. Has had some intrusive memories about past traumatic events. Reports that she is having flashbacks of past traumatic events. She reports having hypervigilance and exaggerated startle response.   Has apt to see Stevphen Meuse, [facility] for therapy next week.   Past Psychiatric Medication Trials: Cymbalta Prozac Lexapro Paxil Effexor Nortriptyline-prescribed by GI for gastroparesis Remeron-caused insomnia Buspar- Adverse reaction Abilify-involuntary movements Zyprexa-effective for a period of time. Caused excessive weight gain at  times. Seroquel Latuda-effective. Sleeping "harder" on 80 mg. Lamictal Gabapentin- Cannot tolerate doses higher than 800 mg BID Trileptal- Reports that she was unable to tolerate 900 mg QHS Belsomra-helpful with sleep quality but not sleep quantity. Klonopin Ativan Librium Clonidine- Effective Propranolol Trazodone Ambien Nuvigil Adderall  AIMS     Office Visit from 02/25/2019 in [facility]  AIMS Total Score  0       Review of Systems:  Review of Systems  Gastrointestinal: Negative for constipation.       Improved GI s/s.  Musculoskeletal: Positive for back pain. Negative for gait problem.  Neurological: Negative for tremors.  Psychiatric/Behavioral:       Please refer to HPI    Medications: I have reviewed the patient's current medications.  Current Outpatient Medications  Medication Sig Dispense Refill  . [START ON 09/13/2019] chlordiazePOXIDE (LIBRIUM) 10 MG capsule Take 1 po q am and 2 po QHS 90 capsule 2  . [START ON 09/02/2019] clonazePAM (KLONOPIN) 0.5 MG tablet TAKE 1/2 TO 1 TABLET BY MOUTH IN THE MORNING AND TAKE 1 TABLET BY MOUTH AT BEDTIME 60 tablet 2  . cloNIDine (CATAPRES) 0.1 MG tablet Take 1 tab po QHS and 1 qd prn 180 tablet 0  . dexlansoprazole (DEXILANT) 60 MG capsule Take 60 mg by mouth every morning.    . gabapentin (NEURONTIN) 800 MG tablet Take 800 mg by mouth 2 (two) times daily.    Marland Kitchen levothyroxine (SYNTHROID, LEVOTHROID) 50 MCG tablet Take 50 mcg by mouth daily.    Marland Kitchen Lifitegrast (XIIDRA) 5 % SOLN Apply to eye.    . magnesium 30 MG tablet Take 30 mg by mouth 2 (two) times  daily.    . methocarbamol (ROBAXIN) 750 MG tablet Take 750 mg by mouth every 8 (eight) hours as needed.     . Multiple Vitamin (MULTIVITAMIN) tablet Take 1 tablet by mouth daily.    . nortriptyline (PAMELOR) 50 MG capsule Take 1 capsule (50 mg total) by mouth at bedtime. 30 capsule 1  . ondansetron (ZOFRAN-ODT) 8 MG disintegrating tablet Take 8 mg by mouth every 8  (eight) hours as needed for nausea.    Marland Kitchen oxcarbazepine (TRILEPTAL) 600 MG tablet Take 1 tablet (600 mg total) by mouth at bedtime. 90 tablet 1  . Probiotic Product (PROBIOTIC DAILY PO) Take by mouth.    . levonorgestrel-ethinyl estradiol (SEASONALE,INTROVALE,JOLESSA) 0.15-0.03 MG tablet Take 1 tablet by mouth daily.    Marland Kitchen lurasidone (LATUDA) 80 MG TABS tablet Take 1 tablet (80 mg total) by mouth daily with supper. 30 tablet 2   No current facility-administered medications for this visit.    Medication Side Effects: None  Allergies:  Allergies  Allergen Reactions  . Promethazine     Other reaction(s): Agitation  . Ciprofloxacin Other (See Comments)    c-diff  . Dilaudid [Hydromorphone]     sob  . Epinephrine   . Penicillins Hives    Has patient had a PCN reaction causing immediate rash, facial/tongue/throat swelling, SOB or lightheadedness with hypotension: Yes Has patient had a PCN reaction causing severe rash involving mucus membranes or skin necrosis: No Has patient had a PCN reaction that required hospitalization: No Has patient had a PCN reaction occurring within the last 10 years: Yes If all of the above answers are "NO", then may proceed with Cephalosporin use.   . Toradol [Ketorolac Tromethamine] Nausea And Vomiting    Past Medical History:  Diagnosis Date  . Anorexia    at 15  . Anxiety   . Attention deficit disorder (ADD)   . Bone spur   . Depression   . Eczema   . HPV (human papilloma virus) infection     Family History  Problem Relation Age of Onset  . Fibromyalgia Mother   . Anxiety disorder Mother   . Depression Mother   . Depression Father   . Anxiety disorder Father     Social History   Socioeconomic History  . Marital status: Single    Spouse name: Not on file  . Number of children: Not on file  . Years of education: Not on file  . Highest education level: Not on file  Occupational History  . Not on file  Tobacco Use  . Smoking status:  Never Smoker  . Smokeless tobacco: Never Used  Substance and Sexual Activity  . Alcohol use: Yes    Comment: occasion  . Drug use: No  . Sexual activity: Never  Other Topics Concern  . Not on file  Social History Narrative   Regular exercise: none   Caffeine use: daily   Social Determinants of Health   Financial Resource Strain:   . Difficulty of Paying Living Expenses: Not on file  Food Insecurity:   . Worried About Charity fundraiser in the Last Year: Not on file  . Ran Out of Food in the Last Year: Not on file  Transportation Needs:   . Lack of Transportation (Medical): Not on file  . Lack of Transportation (Non-Medical): Not on file  Physical Activity:   . Days of Exercise per Week: Not on file  . Minutes of Exercise per Session: Not on file  Stress:   . Feeling of Stress : Not on file  Social Connections:   . Frequency of Communication with Friends and Family: Not on file  . Frequency of Social Gatherings with Friends and Family: Not on file  . Attends Religious Services: Not on file  . Active Member of Clubs or Organizations: Not on file  . Attends Banker Meetings: Not on file  . Marital Status: Not on file  Intimate Partner Violence:   . Fear of Current or Ex-Partner: Not on file  . Emotionally Abused: Not on file  . Physically Abused: Not on file  . Sexually Abused: Not on file    Past Medical History, Surgical history, Social history, and Family history were reviewed and updated as appropriate.   Please see review of systems for further details on the patient's review from today.   Objective:   Physical Exam:  BP 105/63   Pulse 87   Wt 159 lb (72.1 kg)   BMI 23.48 kg/m   Physical Exam Constitutional:      General: She is not in acute distress.    Appearance: She is well-developed.  Musculoskeletal:        General: No deformity.  Neurological:     Mental Status: She is alert and oriented to person, place, and time.      Coordination: Coordination normal.  Psychiatric:        Attention and Perception: Attention and perception normal. She does not perceive auditory or visual hallucinations.        Mood and Affect: Mood is anxious and depressed. Affect is not labile, blunt, angry or inappropriate.        Speech: Speech normal.        Behavior: Behavior normal.        Thought Content: Thought content normal. Thought content is not paranoid or delusional. Thought content does not include homicidal or suicidal ideation. Thought content does not include homicidal or suicidal plan.        Cognition and Memory: Cognition and memory normal.        Judgment: Judgment normal.     Comments: Insight intact     Lab Review:     Component Value Date/Time   NA 139 06/26/2017 0025   NA 138 08/31/2011 1624   K 3.7 06/26/2017 0025   K 4.1 08/31/2011 1624   CL 105 06/26/2017 0025   CL 102 08/31/2011 1624   CO2 25 06/26/2017 0025   CO2 27 08/31/2011 1624   GLUCOSE 110 (H) 06/26/2017 0025   GLUCOSE 81 08/31/2011 1624   BUN 9 06/26/2017 0025   BUN 10 08/31/2011 1624   CREATININE 0.88 06/26/2017 0025   CREATININE 0.88 08/31/2011 1624   CALCIUM 9.1 06/26/2017 0025   CALCIUM 9.1 08/31/2011 1624   PROT 7.4 06/26/2017 0025   PROT 7.8 08/31/2011 1624   ALBUMIN 4.5 06/26/2017 0025   ALBUMIN 4.0 08/31/2011 1624   AST 19 06/26/2017 0025   AST 25 08/31/2011 1624   ALT 14 06/26/2017 0025   ALT 24 08/31/2011 1624   ALKPHOS 49 06/26/2017 0025   ALKPHOS 52 08/31/2011 1624   BILITOT 0.5 06/26/2017 0025   BILITOT 0.3 08/31/2011 1624   GFRNONAA >60 06/26/2017 0025   GFRNONAA >60 08/31/2011 1624   GFRAA >60 06/26/2017 0025   GFRAA >60 08/31/2011 1624       Component Value Date/Time   WBC 6.4 06/26/2017 0025   RBC 3.85 06/26/2017 0025   HGB  12.1 06/26/2017 0025   HGB 12.7 08/31/2011 1624   HCT 34.8 (L) 06/26/2017 0025   HCT 37.7 08/31/2011 1624   PLT 246 06/26/2017 0025   PLT 248 08/31/2011 1624   MCV 90.3  06/26/2017 0025   MCV 90 08/31/2011 1624   MCH 31.5 06/26/2017 0025   MCHC 34.9 06/26/2017 0025   RDW 11.9 06/26/2017 0025   RDW 12.6 08/31/2011 1624   LYMPHSABS 2.2 04/07/2017 1145   MONOABS 0.4 04/07/2017 1145   EOSABS 0.1 04/07/2017 1145   BASOSABS 0.0 04/07/2017 1145    No results found for: POCLITH, LITHIUM   No results found for: PHENYTOIN, PHENOBARB, VALPROATE, CBMZ   .res Assessment: Plan:   Discussed option to continue current dose of nortriptyline since patient initially had some side effects, which seem to have recently resolved, or increasing to 50 mg at bedtime for possible further improvement in mood and anxiety.  Patient reports that she would like to increase nortriptyline at this time. Will increase nortriptyline to 50 mg at bedtime. We will continue all other medications as prescribed. Agree with plan to see Stevphen Meuse, [facility] next week to start EMDR. Patient to follow-up with this provider in 4 weeks or sooner if clinically indicated. Patient advised to contact office with any questions, adverse effects, or acute worsening in signs and symptoms.  Peggy Ruiz was seen today for anxiety, insomnia and depression.  Diagnoses and all orders for this visit:  PTSD (post-traumatic stress disorder) -     nortriptyline (PAMELOR) 50 MG capsule; Take 1 capsule (50 mg total) by mouth at bedtime. -     chlordiazePOXIDE (LIBRIUM) 10 MG capsule; Take 1 po q am and 2 po QHS -     cloNIDine (CATAPRES) 0.1 MG tablet; Take 1 tab po QHS and 1 qd prn -     clonazePAM (KLONOPIN) 0.5 MG tablet; TAKE 1/2 TO 1 TABLET BY MOUTH IN THE MORNING AND TAKE 1 TABLET BY MOUTH AT BEDTIME  Mild mood disorder (HCC) -     nortriptyline (PAMELOR) 50 MG capsule; Take 1 capsule (50 mg total) by mouth at bedtime. -     lurasidone (LATUDA) 80 MG TABS tablet; Take 1 tablet (80 mg total) by mouth daily with supper.  Generalized anxiety disorder -     chlordiazePOXIDE (LIBRIUM) 10 MG capsule; Take 1 po q am and  2 po QHS -     cloNIDine (CATAPRES) 0.1 MG tablet; Take 1 tab po QHS and 1 qd prn     Please see After Visit Summary for patient specific instructions.  Future Appointments  Date Time Provider Department Center  08/29/2019  2:00 PM Stevphen Meuse, [facility] CP-CP None  09/18/2019  1:30 PM Corie Chiquito, [facility]NP CP-CP None  10/08/2019  2:00 PM Stevphen Meuse, [facility] CP-CP None  10/22/2019  2:00 PM Stevphen Meuse, [facility] CP-CP None    No orders of the defined types were placed in this encounter.   -------------------------------

## 2019-08-29 ENCOUNTER — Other Ambulatory Visit: Payer: Self-pay

## 2019-08-29 ENCOUNTER — Ambulatory Visit (INDEPENDENT_AMBULATORY_CARE_PROVIDER_SITE_OTHER): Payer: PRIVATE HEALTH INSURANCE | Admitting: Psychiatry

## 2019-08-29 DIAGNOSIS — F4310 Post-traumatic stress disorder, unspecified: Secondary | ICD-10-CM
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
## 2019-09-10 ENCOUNTER — Other Ambulatory Visit: Payer: Self-pay

## 2019-09-10 ENCOUNTER — Ambulatory Visit (INDEPENDENT_AMBULATORY_CARE_PROVIDER_SITE_OTHER): Payer: PRIVATE HEALTH INSURANCE | Admitting: Psychiatry

## 2019-09-10 DIAGNOSIS — F4310 Post-traumatic stress disorder, unspecified: Secondary | ICD-10-CM | POA: Diagnosis not present

## 2019-09-10 NOTE — Progress Notes (Signed)
[facility] Counselor/Therapist Progress Note  Patient ID: Peggy Ruiz, MRN: [id number],    Date: 09/10/2019  Time Spent: 47 minutes start time 12:13 PM end time 1 PM  Treatment Type: Individual Therapy  Reported Symptoms: anxiety, panic attack, triggered responses  Mental Status Exam:  Appearance:   Casual and Neat     Behavior:  Sharing  Motor:  Restlestness  Speech/Language:   Pressured  Affect:  Appropriate  Mood:  anxious  Thought process:  normal  Thought content:    WNL  Sensory/Perceptual disturbances:    WNL  Orientation:  oriented to person, place, time/date and situation  Attention:  Good  Concentration:  Good  Memory:  WNL  Fund of knowledge:   Good  Insight:    Good  Judgment:   Good  Impulse Control:  Good   Risk Assessment: Danger to Self:  No Self-injurious Behavior: No Danger to Others: No Duty to Warn:no Physical Aggression / Violence:No  Access to Firearms a concern: No  Gang Involvement:No   Subjective: Patient was present for session.  She shared that she thought of some things she didn't share at last session.  She explained both parents have attempted suicide.  Last May mom overdosed memorial day weekend and her father found her unresponsive.   She stated the day after her wedding both parents attempted suicide and patient found dad slumped over in his car. While dad was in hospital mom goes to the bathroom and starts beating her head against the wall and screaming that she didn't want to be here anymore.  She shared during all this she is taking care of her autism  Mental retardation brother during all of this. She stated she had to deal with it other times growing up as a child.  Developed treatment plan and set goals in session with patient.  She could not sign due to coronavirus.  Had patient participate in an imaginal nurturing exercise through EMDR.  Patient was able to work with a 74-year-old partner her that was very traumatized.  Patient  reported feeling positive at the end of session about the experience and felt that she connected with that younger part of her.  Discussed different things that she could do to honor her over the next week.  Patient became tearful as she reported that she realized she does not know what she likes.  Patient was given a bangle to wear to remind herself to try different things to see what she may like examples that were given were 5 minutes of yoga 5 minutes of walking 5 minutes of doing other exercises and trying different hobbies.  Interventions: Solution-Oriented/Positive Psychology and Eye Movement Desensitization and Reprocessing (EMDR)  Diagnosis:   ICD-10-CM   1. Posttraumatic stress disorder  F43.10     Plan: Patient is to utilize coping skills to help manage triggered responses.  Patient is to work on trying different things over the next week just for a few minutes at a time as she feels comfortable to see what she does actually like to do.  Patient is also to work on affirming herself regularly. Long-term goal: Interacting normally with friends and family without irrational fears or intrusive thoughts that control behavior Display a full range of emotions without experiencing loss of control Short-term goal: Identify and replace negative self talk and catastrophic sizing that is associated with past trauma and current stimulus triggers for anxiety  Peggy Ruiz, [facility]

## 2019-09-18 ENCOUNTER — Other Ambulatory Visit: Payer: Self-pay

## 2019-09-18 ENCOUNTER — Ambulatory Visit (INDEPENDENT_AMBULATORY_CARE_PROVIDER_SITE_OTHER): Payer: PRIVATE HEALTH INSURANCE | Admitting: Psychiatry

## 2019-09-18 ENCOUNTER — Encounter: Payer: Self-pay | Admitting: Psychiatry

## 2019-09-18 VITALS — BP 107/74 | HR 86 | Wt 158.5 lb

## 2019-09-18 DIAGNOSIS — F4310 Post-traumatic stress disorder, unspecified: Secondary | ICD-10-CM

## 2019-09-18 DIAGNOSIS — Z79899 Other long term (current) drug therapy: Secondary | ICD-10-CM

## 2019-09-18 DIAGNOSIS — F39 Unspecified mood [affective] disorder: Secondary | ICD-10-CM

## 2019-09-18 DIAGNOSIS — F5101 Primary insomnia: Secondary | ICD-10-CM | POA: Diagnosis not present

## 2019-09-18 DIAGNOSIS — F411 Generalized anxiety disorder: Secondary | ICD-10-CM | POA: Diagnosis not present

## 2019-09-18 NOTE — Progress Notes (Signed)
Peggy Ruiz [id number] [birth year] 34 y.o.  Subjective:   Patient ID:  Peggy Ruiz is a 34 y.o. (DOB 02-16-1986) female.  Chief Complaint:  Chief Complaint  Patient presents with  . Follow-up    h/o Anxiety, Depression, Insomnia    HPI Peggy Ruiz presents to the office today for follow-up of mood, anxiety, and insomnia. Has been on Nortriptyline 50 mg for about 3.5 weeks. She reports that she and her husband have noticed about a 25% improvement in depression with increased dose of Nortriptyline. Reports that irritability has been milder and shorter in duration. Has continued to take Nortriptyline in the morning and has had less sleep disturbance than taking at HS. She reports that she feels energized after taking Nortriptyline and then will "hit a wall" around 3 pm where she feels tired and sleepy. Has started taking naps again due to fatigue. She reports that taking naps in the late afternoon has resulted in some delay with falling asleep at night. Energy has been good during the day until mid-afternoon and then again after nap. Motivation has improved. Appetite has been consistent with baseline. Concentration has been adequate. Denies SI.   Continues to have crying episodes. Reports that certain events will trigger anxiety and sadness at times and other times does not have an emotional response to the same event or trigger. She reports that panic attacks have been better. Continues to have frequent worry. She reports that she has been experiencing some intrusive memories and re-experiencing.   Past Psychiatric Medication Trials: Cymbalta Prozac Lexapro Paxil Effexor Nortriptyline-prescribed by GI for gastroparesis Remeron-caused insomnia Buspar- Adverse reaction Abilify-involuntary movements Zyprexa-effective for a period of time. Caused excessive weight gain at times. Seroquel Latuda-effective. Sleeping "harder" on 80 mg. Lamictal Gabapentin- Cannot tolerate doses higher  than 800 mg BID Trileptal- Reports that she was unable to tolerate 900 mg QHS Belsomra-helpful with sleep quality but not sleep quantity. Klonopin Ativan Librium Clonidine- Effective Propranolol Trazodone Ambien Nuvigil Adderall   AIMS     Office Visit from 02/25/2019 in [facility]  AIMS Total Score  0       Review of Systems:  Review of Systems  HENT: Positive for dental problem.        Had root canal last week. Started on antibiotic yesterday for possible infection  Cardiovascular: Positive for palpitations.  Musculoskeletal: Negative for gait problem.  Neurological: Positive for dizziness. Negative for tremors.       Occ dizziness  Psychiatric/Behavioral:       Please refer to HPI  Has noticed some increase in HR, even while sleeping and not anxious.   Medications: I have reviewed the patient's current medications.  Current Outpatient Medications  Medication Sig Dispense Refill  . azithromycin (ZITHROMAX) 250 MG tablet Take 250 mg by mouth as directed.    . chlordiazePOXIDE (LIBRIUM) 10 MG capsule Take 1 po q am and 2 po QHS 90 capsule 2  . clonazePAM (KLONOPIN) 0.5 MG tablet TAKE 1/2 TO 1 TABLET BY MOUTH IN THE MORNING AND TAKE 1 TABLET BY MOUTH AT BEDTIME 60 tablet 2  . cloNIDine (CATAPRES) 0.1 MG tablet Take 1 tab po QHS and 1 qd prn 180 tablet 0  . dexlansoprazole (DEXILANT) 60 MG capsule Take 60 mg by mouth every morning.    . gabapentin (NEURONTIN) 800 MG tablet Take 800 mg by mouth 2 (two) times daily.    Marland Kitchen levonorgestrel-ethinyl estradiol (SEASONALE,INTROVALE,JOLESSA) 0.15-0.03 MG tablet Take 1 tablet by mouth  daily.    . levothyroxine (SYNTHROID, LEVOTHROID) 50 MCG tablet Take 50 mcg by mouth daily.    Marland Kitchen Lifitegrast (XIIDRA) 5 % SOLN Apply to eye.    . lurasidone (LATUDA) 80 MG TABS tablet Take 1 tablet (80 mg total) by mouth daily with supper. 30 tablet 2  . magnesium 30 MG tablet Take 30 mg by mouth 2 (two) times daily.    . methocarbamol  (ROBAXIN) 750 MG tablet Take 750 mg by mouth every 8 (eight) hours as needed.     . methylPREDNISolone (MEDROL DOSEPAK) 4 MG TBPK tablet     . Multiple Vitamin (MULTIVITAMIN) tablet Take 1 tablet by mouth daily.    . nortriptyline (PAMELOR) 50 MG capsule Take 1 capsule (50 mg total) by mouth at bedtime. 30 capsule 1  . ondansetron (ZOFRAN-ODT) 8 MG disintegrating tablet Take 8 mg by mouth every 8 (eight) hours as needed for nausea.    Marland Kitchen oxcarbazepine (TRILEPTAL) 600 MG tablet Take 1 tablet (600 mg total) by mouth at bedtime. 90 tablet 1  . Probiotic Product (PROBIOTIC DAILY PO) Take by mouth.     No current facility-administered medications for this visit.    Medication Side Effects: Other: Increased HR, palpitations, tightness in chest  Allergies:  Allergies  Allergen Reactions  . Promethazine     Other reaction(s): Agitation  . Ciprofloxacin Other (See Comments)    c-diff  . Dilaudid [Hydromorphone]     sob  . Epinephrine   . Penicillins Hives    Has patient had a PCN reaction causing immediate rash, facial/tongue/throat swelling, SOB or lightheadedness with hypotension: Yes Has patient had a PCN reaction causing severe rash involving mucus membranes or skin necrosis: No Has patient had a PCN reaction that required hospitalization: No Has patient had a PCN reaction occurring within the last 10 years: Yes If all of the above answers are "NO", then may proceed with Cephalosporin use.   . Toradol [Ketorolac Tromethamine] Nausea And Vomiting    Past Medical History:  Diagnosis Date  . Anorexia    at 15  . Anxiety   . Attention deficit disorder (ADD)   . Bone spur   . Depression   . Eczema   . HPV (human papilloma virus) infection     Family History  Problem Relation Age of Onset  . Fibromyalgia Mother   . Anxiety disorder Mother   . Depression Mother   . Depression Father   . Anxiety disorder Father     Social History   Socioeconomic History  . Marital status:  Single    Spouse name: Not on file  . Number of children: Not on file  . Years of education: Not on file  . Highest education level: Not on file  Occupational History  . Not on file  Tobacco Use  . Smoking status: Never Smoker  . Smokeless tobacco: Never Used  Substance and Sexual Activity  . Alcohol use: Yes    Comment: occasion  . Drug use: No  . Sexual activity: Never  Other Topics Concern  . Not on file  Social History Narrative   Regular exercise: none   Caffeine use: daily   Social Determinants of Health   Financial Resource Strain:   . Difficulty of Paying Living Expenses:   Food Insecurity:   . Worried About Programme researcher, broadcasting/film/video in the Last Year:   . Barista in the Last Year:   Transportation Needs:   .  Lack of Transportation (Medical):   Marland Kitchen Lack of Transportation (Non-Medical):   Physical Activity:   . Days of Exercise per Week:   . Minutes of Exercise per Session:   Stress:   . Feeling of Stress :   Social Connections:   . Frequency of Communication with Friends and Family:   . Frequency of Social Gatherings with Friends and Family:   . Attends Religious Services:   . Active Member of Clubs or Organizations:   . Attends Banker Meetings:   Marland Kitchen Marital Status:   Intimate Partner Violence:   . Fear of Current or Ex-Partner:   . Emotionally Abused:   Marland Kitchen Physically Abused:   . Sexually Abused:     Past Medical History, Surgical history, Social history, and Family history were reviewed and updated as appropriate.   Please see review of systems for further details on the patient's review from today.   Objective:   Physical Exam:  BP 107/74   Pulse 86   Wt 158 lb 8 oz (71.9 kg)   BMI 23.41 kg/m   Physical Exam Constitutional:      General: She is not in acute distress. Musculoskeletal:        General: No deformity.  Neurological:     Mental Status: She is alert and oriented to person, place, and time.     Coordination:  Coordination normal.  Psychiatric:        Attention and Perception: Attention and perception normal. She does not perceive auditory or visual hallucinations.        Mood and Affect: Mood is anxious. Mood is not depressed. Affect is not labile, blunt, angry or inappropriate.        Speech: Speech normal.        Behavior: Behavior normal.        Thought Content: Thought content normal. Thought content is not paranoid or delusional. Thought content does not include homicidal or suicidal ideation. Thought content does not include homicidal or suicidal plan.        Cognition and Memory: Cognition and memory normal.        Judgment: Judgment normal.     Comments: Insight intact     Lab Review:     Component Value Date/Time   NA 139 06/26/2017 0025   NA 138 08/31/2011 1624   K 3.7 06/26/2017 0025   K 4.1 08/31/2011 1624   CL 105 06/26/2017 0025   CL 102 08/31/2011 1624   CO2 25 06/26/2017 0025   CO2 27 08/31/2011 1624   GLUCOSE 110 (H) 06/26/2017 0025   GLUCOSE 81 08/31/2011 1624   BUN 9 06/26/2017 0025   BUN 10 08/31/2011 1624   CREATININE 0.88 06/26/2017 0025   CREATININE 0.88 08/31/2011 1624   CALCIUM 9.1 06/26/2017 0025   CALCIUM 9.1 08/31/2011 1624   PROT 7.4 06/26/2017 0025   PROT 7.8 08/31/2011 1624   ALBUMIN 4.5 06/26/2017 0025   ALBUMIN 4.0 08/31/2011 1624   AST 19 06/26/2017 0025   AST 25 08/31/2011 1624   ALT 14 06/26/2017 0025   ALT 24 08/31/2011 1624   ALKPHOS 49 06/26/2017 0025   ALKPHOS 52 08/31/2011 1624   BILITOT 0.5 06/26/2017 0025   BILITOT 0.3 08/31/2011 1624   GFRNONAA >60 06/26/2017 0025   GFRNONAA >60 08/31/2011 1624   GFRAA >60 06/26/2017 0025   GFRAA >60 08/31/2011 1624       Component Value Date/Time   WBC 6.4 06/26/2017 0025  RBC 3.85 06/26/2017 0025   HGB 12.1 06/26/2017 0025   HGB 12.7 08/31/2011 1624   HCT 34.8 (L) 06/26/2017 0025   HCT 37.7 08/31/2011 1624   PLT 246 06/26/2017 0025   PLT 248 08/31/2011 1624   MCV 90.3 06/26/2017  0025   MCV 90 08/31/2011 1624   MCH 31.5 06/26/2017 0025   MCHC 34.9 06/26/2017 0025   RDW 11.9 06/26/2017 0025   RDW 12.6 08/31/2011 1624   LYMPHSABS 2.2 04/07/2017 1145   MONOABS 0.4 04/07/2017 1145   EOSABS 0.1 04/07/2017 1145   BASOSABS 0.0 04/07/2017 1145    No results found for: POCLITH, LITHIUM   No results found for: PHENYTOIN, PHENOBARB, VALPROATE, CBMZ   .res Assessment: Plan:   Patient reports that she has noticed some improvements with nortriptyline, however she is concerned about possible cardiac side effects. Will therefore obtain EKG to evaluate for possible QTc prolongation and will also obtain nortriptyline level to evaluate for possible cardiac adverse effects. Will continue current medications without changes, pending results of EKG and Nortriptyline level.  Pt to f/u in 4 weeks or sooner if clinically indicated.  Recommend continuing to see Stevphen Meuse, [facility]. Patient advised to contact office with any questions, adverse effects, or acute worsening in signs and symptoms.  Micaella was seen today for follow-up.  Diagnoses and all orders for this visit:  PTSD (post-traumatic stress disorder)  High risk medication use -     EKG -     Nortriptyline (Aventyl), Serum [[facility]]  Primary insomnia  Generalized anxiety disorder  Mild mood disorder (HCC)     Please see After Visit Summary for patient specific instructions.  Future Appointments  Date Time Provider Department Center  09/24/2019 12:00 PM Stevphen Meuse, [facility] CP-[facility] None  10/08/2019  2:00 PM Stevphen Meuse, [facility] CP-CP None  10/15/2019  1:00 PM Stevphen Meuse, [facility] CP-CP None  10/15/2019  2:00 PM Corie Chiquito, [facility] CP-CP None  10/22/2019  2:00 PM Stevphen Meuse, [facility] CP-CP None  10/29/2019  2:00 PM Stevphen Meuse, [facility] CP-CP None  11/05/2019  2:00 PM Stevphen Meuse, [facility] CP-CP None  11/12/2019  2:00 PM Stevphen Meuse, [facility] CP-CP None  11/19/2019  2:00 PM Stevphen Meuse, [facility] CP-CP None    Orders  Placed This Encounter  Procedures  . Nortriptyline (Aventyl), Serum [[facility]]  . EKG    -------------------------------

## 2019-09-24 ENCOUNTER — Other Ambulatory Visit: Payer: Self-pay

## 2019-09-24 ENCOUNTER — Ambulatory Visit (INDEPENDENT_AMBULATORY_CARE_PROVIDER_SITE_OTHER): Payer: PRIVATE HEALTH INSURANCE | Admitting: Psychiatry

## 2019-09-24 DIAGNOSIS — F4310 Post-traumatic stress disorder, unspecified: Secondary | ICD-10-CM
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
## 2019-09-25 ENCOUNTER — Telehealth: Payer: Self-pay | Admitting: Psychiatry

## 2019-09-25 NOTE — Telephone Encounter (Signed)
Pt called to follow up on order for EKG that Corie Chiquito placed on March 24. I informed Shanda Bumps that her order is not in system correctly in [facility] but I.T. cannot help me do it. I recommend that Shanda Bumps get with IT to help her with it or write an order on an RX and give to pt. Pt notified.

## 2019-10-03 LAB — NORTRIPTYLINE (AVENTYL), SERUM: Nortriptyline (Aventyl), Serum: 20 ng/mL — ABNORMAL LOW (ref 50–150)

## 2019-10-08 ENCOUNTER — Other Ambulatory Visit: Payer: Self-pay

## 2019-10-08 ENCOUNTER — Ambulatory Visit (INDEPENDENT_AMBULATORY_CARE_PROVIDER_SITE_OTHER): Payer: PRIVATE HEALTH INSURANCE | Admitting: Psychiatry

## 2019-10-08 DIAGNOSIS — F4310 Post-traumatic stress disorder, unspecified: Secondary | ICD-10-CM
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
## 2019-10-15 ENCOUNTER — Ambulatory Visit (INDEPENDENT_AMBULATORY_CARE_PROVIDER_SITE_OTHER): Payer: PRIVATE HEALTH INSURANCE | Admitting: Psychiatry

## 2019-10-15 ENCOUNTER — Other Ambulatory Visit: Payer: Self-pay

## 2019-10-15 ENCOUNTER — Encounter: Payer: Self-pay | Admitting: Psychiatry

## 2019-10-15 DIAGNOSIS — F411 Generalized anxiety disorder: Secondary | ICD-10-CM | POA: Diagnosis not present

## 2019-10-15 DIAGNOSIS — F4310 Post-traumatic stress disorder, unspecified: Secondary | ICD-10-CM

## 2019-10-15 DIAGNOSIS — F39 Unspecified mood [affective] disorder: Secondary | ICD-10-CM

## 2019-10-15 MED ORDER — LURASIDONE HCL 80 MG PO TABS: 80.0000 mg | ORAL_TABLET | Freq: Every day | ORAL | 2 refills | Status: DC

## 2019-10-15 MED ORDER — NORTRIPTYLINE HCL 10 MG PO CAPS: ORAL_CAPSULE | ORAL | 0 refills | Status: DC

## 2019-10-15 MED ORDER — NORTRIPTYLINE HCL 25 MG PO CAPS: 25.0000 mg | ORAL_CAPSULE | Freq: Every morning | ORAL | 0 refills | Status: DC

## 2019-10-15 NOTE — Progress Notes (Signed)
[name] [id number] 01/03/1986 34 y.o.  Subjective:   Patient ID:  Peggy Ruiz is a 34 y.o. (DOB 1986/06/06) female.  Chief Complaint:  Chief Complaint  Patient presents with  . Fatigue  . Anxiety  . Depression    HPI Peggy Ruiz presents to the office today for follow-up of depression, anxiety, and insomnia. She has picked up script for EKG last week. She contacted PCP, [facility], about having EKG performed there. Her PCP is Peggy Ruiz.   She reports that Nortriptyline has helped keep her calm. She reports that she had increased HR for 2 weeks after last visit. Reports HR would be 110 at rest and 125 when she started walking. She reports that in the last 2 weeks her HR has remained below 100. Abdominal pain has been worse since increase in Nortriptyline to 50 mg po QHS.   She reports that she has been very fatigued over the last 2-3 weeks. She reports that she has difficulty getting out of bed in the morning. She reports in the past she has had the most energy in the mornings. She reports that other than 2 other incidents, this is the most fatigue she has experienced. She reports that she is wanting to sleeping more. She reports that she continues to "hit a wall" around 2-3 pm in the afternoon. She reports that her anxiety has been high and then this triggers panic attacks, intrusive memories, and rumination. Reports frequent crying episodes. Reports that mood has been depressed. She reports feelings of hopelessness. Appetite has been decreased and has not been hungry. Concentration has been "off and on." Motivation has been lower due to fatigue. Denies SI.    Past Psychiatric Medication Trials: Cymbalta Prozac Lexapro Paxil Effexor Nortriptyline-prescribed by GI for gastroparesis Remeron-caused insomnia Buspar- Adverse reaction Abilify-involuntary movements Zyprexa-effective for a period of time. Caused excessive weight gain at  times. Seroquel Latuda-effective. Sleeping "harder" on 80 mg. Lamictal Gabapentin- Cannot tolerate doses higher than 800 mg BID Trileptal- Reports that she was unable to tolerate 900 mg QHS Belsomra-helpful with sleep quality but not sleep quantity. Klonopin Ativan Librium Clonidine- Effective Propranolol Trazodone Ambien Nuvigil Adderall   AIMS     Office Visit from 02/25/2019 in [facility]  AIMS Total Score  0       Review of Systems:  Review of Systems  Constitutional:       Increased hair loss  Musculoskeletal: Negative for gait problem.  Neurological: Positive for dizziness and headaches. Negative for tremors.       Had recent episode of vertigo. Has had more dizziness. Reports that this has happened in the past prior to Nortriptyline  Psychiatric/Behavioral:       Please refer to HPI    Medications: I have reviewed the patient's current medications.  Current Outpatient Medications  Medication Sig Dispense Refill  . azithromycin (ZITHROMAX) 250 MG tablet Take 250 mg by mouth as directed.    . chlordiazePOXIDE (LIBRIUM) 10 MG capsule Take 1 po q am and 2 po QHS 90 capsule 2  . clonazePAM (KLONOPIN) 0.5 MG tablet TAKE 1/2 TO 1 TABLET BY MOUTH IN THE MORNING AND TAKE 1 TABLET BY MOUTH AT BEDTIME 60 tablet 2  . cloNIDine (CATAPRES) 0.1 MG tablet Take 1 tab po QHS and 1 qd prn 180 tablet 0  . dexlansoprazole (DEXILANT) 60 MG capsule Take 60 mg by mouth every morning.    . gabapentin (NEURONTIN) 800 MG tablet Take  800 mg by mouth 2 (two) times daily.    Marland Kitchen levonorgestrel-ethinyl estradiol (SEASONALE,INTROVALE,JOLESSA) 0.15-0.03 MG tablet Take 1 tablet by mouth daily.    Marland Kitchen levothyroxine (SYNTHROID, LEVOTHROID) 50 MCG tablet Take 50 mcg by mouth daily.    Marland Kitchen Lifitegrast (XIIDRA) 5 % SOLN Apply to eye.    . lurasidone (LATUDA) 80 MG TABS tablet Take 1 tablet (80 mg total) by mouth daily with supper. 30 tablet 2  . magnesium 30 MG tablet Take 30 mg by mouth  2 (two) times daily.    . methocarbamol (ROBAXIN) 750 MG tablet Take 750 mg by mouth every 8 (eight) hours as needed.     . methylPREDNISolone (MEDROL DOSEPAK) 4 MG TBPK tablet     . Multiple Vitamin (MULTIVITAMIN) tablet Take 1 tablet by mouth daily.    . nortriptyline (PAMELOR) 10 MG capsule Take 4 capsules po qd x 10 days, then decrease to 3 capsules for 10 days 120 capsule 0  . [START ON 10/29/2019] nortriptyline (PAMELOR) 25 MG capsule Take 1 capsule (25 mg total) by mouth every morning. 30 capsule 0  . ondansetron (ZOFRAN-ODT) 8 MG disintegrating tablet Take 8 mg by mouth every 8 (eight) hours as needed for nausea.    Marland Kitchen oxcarbazepine (TRILEPTAL) 600 MG tablet Take 1 tablet (600 mg total) by mouth at bedtime. 90 tablet 1  . Probiotic Product (PROBIOTIC DAILY PO) Take by mouth.     No current facility-administered medications for this visit.    Medication Side Effects: Fatigue and Other: Dizziness/Vertigo Increased hair loss.   Allergies:  Allergies  Allergen Reactions  . Promethazine     Other reaction(s): Agitation  . Ciprofloxacin Other (See Comments)    c-diff  . Dilaudid [Hydromorphone]     sob  . Epinephrine   . Penicillins Hives    Has patient had a PCN reaction causing immediate rash, facial/tongue/throat swelling, SOB or lightheadedness with hypotension: Yes Has patient had a PCN reaction causing severe rash involving mucus membranes or skin necrosis: No Has patient had a PCN reaction that required hospitalization: No Has patient had a PCN reaction occurring within the last 10 years: Yes If all of the above answers are "NO", then may proceed with Cephalosporin use.   . Toradol [Ketorolac Tromethamine] Nausea And Vomiting    Past Medical History:  Diagnosis Date  . Anorexia    at 15  . Anxiety   . Attention deficit disorder (ADD)   . Bone spur   . Depression   . Eczema   . HPV (human papilloma virus) infection     Family History  Problem Relation Age of Onset   . Fibromyalgia Mother   . Anxiety disorder Mother   . Depression Mother   . Depression Father   . Anxiety disorder Father     Social History   Socioeconomic History  . Marital status: Single    Spouse name: Not on file  . Number of children: Not on file  . Years of education: Not on file  . Highest education level: Not on file  Occupational History  . Not on file  Tobacco Use  . Smoking status: Never Smoker  . Smokeless tobacco: Never Used  Substance and Sexual Activity  . Alcohol use: Yes    Comment: occasion  . Drug use: No  . Sexual activity: Never  Other Topics Concern  . Not on file  Social History Narrative   Regular exercise: none   Caffeine use: daily  Social Determinants of Health   Financial Resource Strain:   . Difficulty of Paying Living Expenses:   Food Insecurity:   . Worried About Programme researcher, broadcasting/film/video in the Last Year:   . Barista in the Last Year:   Transportation Needs:   . Freight forwarder (Medical):   Marland Kitchen Lack of Transportation (Non-Medical):   Physical Activity:   . Days of Exercise per Week:   . Minutes of Exercise per Session:   Stress:   . Feeling of Stress :   Social Connections:   . Frequency of Communication with Friends and Family:   . Frequency of Social Gatherings with Friends and Family:   . Attends Religious Services:   . Active Member of Clubs or Organizations:   . Attends Banker Meetings:   Marland Kitchen Marital Status:   Intimate Partner Violence:   . Fear of Current or Ex-Partner:   . Emotionally Abused:   Marland Kitchen Physically Abused:   . Sexually Abused:     Past Medical History, Surgical history, Social history, and Family history were reviewed and updated as appropriate.   Please see review of systems for further details on the patient's review from today.   Objective:   Physical Exam:  BP 108/77   Pulse 92   Wt 156 lb (70.8 kg)   BMI 23.04 kg/m   Physical Exam Constitutional:      General: She is  not in acute distress. Musculoskeletal:        General: No deformity.  Neurological:     Mental Status: She is alert and oriented to person, place, and time.     Coordination: Coordination normal.  Psychiatric:        Attention and Perception: Attention and perception normal. She does not perceive auditory or visual hallucinations.        Mood and Affect: Mood is anxious and depressed. Affect is not labile, blunt, angry or inappropriate.        Speech: Speech normal.        Behavior: Behavior normal.        Thought Content: Thought content normal. Thought content is not paranoid or delusional. Thought content does not include homicidal or suicidal ideation. Thought content does not include homicidal or suicidal plan.        Cognition and Memory: Cognition and memory normal.        Judgment: Judgment normal.     Comments: Insight intact     Lab Review:     Component Value Date/Time   NA 139 06/26/2017 0025   NA 138 08/31/2011 1624   K 3.7 06/26/2017 0025   K 4.1 08/31/2011 1624   CL 105 06/26/2017 0025   CL 102 08/31/2011 1624   CO2 25 06/26/2017 0025   CO2 27 08/31/2011 1624   GLUCOSE 110 (H) 06/26/2017 0025   GLUCOSE 81 08/31/2011 1624   BUN 9 06/26/2017 0025   BUN 10 08/31/2011 1624   CREATININE 0.88 06/26/2017 0025   CREATININE 0.88 08/31/2011 1624   CALCIUM 9.1 06/26/2017 0025   CALCIUM 9.1 08/31/2011 1624   PROT 7.4 06/26/2017 0025   PROT 7.8 08/31/2011 1624   ALBUMIN 4.5 06/26/2017 0025   ALBUMIN 4.0 08/31/2011 1624   AST 19 06/26/2017 0025   AST 25 08/31/2011 1624   ALT 14 06/26/2017 0025   ALT 24 08/31/2011 1624   ALKPHOS 49 06/26/2017 0025   ALKPHOS 52 08/31/2011 1624   BILITOT 0.5 06/26/2017  0025   BILITOT 0.3 08/31/2011 1624   GFRNONAA >60 06/26/2017 0025   GFRNONAA >60 08/31/2011 1624   GFRAA >60 06/26/2017 0025   GFRAA >60 08/31/2011 1624       Component Value Date/Time   WBC 6.4 06/26/2017 0025   RBC 3.85 06/26/2017 0025   HGB 12.1 06/26/2017  0025   HGB 12.7 08/31/2011 1624   HCT 34.8 (L) 06/26/2017 0025   HCT 37.7 08/31/2011 1624   PLT 246 06/26/2017 0025   PLT 248 08/31/2011 1624   MCV 90.3 06/26/2017 0025   MCV 90 08/31/2011 1624   MCH 31.5 06/26/2017 0025   MCHC 34.9 06/26/2017 0025   RDW 11.9 06/26/2017 0025   RDW 12.6 08/31/2011 1624   LYMPHSABS 2.2 04/07/2017 1145   MONOABS 0.4 04/07/2017 1145   EOSABS 0.1 04/07/2017 1145   BASOSABS 0.0 04/07/2017 1145    No results found for: POCLITH, LITHIUM   No results found for: PHENYTOIN, PHENOBARB, VALPROATE, CBMZ   .res Assessment: Plan:   Discussed that patient seems to be experiencing several side effects with nortriptyline and therefore recommend decreasing nortriptyline to determine if suspected side effects improve.  Discussed gradually decreasing nortriptyline since patient has history of significant discontinuation signs and symptoms with other medications in the past.  Will decrease nortriptyline to 40 mg daily for 10 days, then decrease to 30 mg daily for 10 days, then decrease to 25 mg daily.  Will then reevaluate patient at next visit since patient had noted some improvements at the 25 mg dose previously. Will continue all other medications as prescribed. Recommend continuing psychotherapy with Lina Sayre, [facility]. Patient to follow-up with this provider in 4 weeks or sooner if clinically indicated. Patient advised to contact office with any questions, adverse effects, or acute worsening in signs and symptoms.  Peggy Ruiz was seen today for fatigue, anxiety and depression.  Diagnoses and all orders for this visit:  PTSD (post-traumatic stress disorder) -     nortriptyline (PAMELOR) 10 MG capsule; Take 4 capsules po qd x 10 days, then decrease to 3 capsules for 10 days -     nortriptyline (PAMELOR) 25 MG capsule; Take 1 capsule (25 mg total) by mouth every morning.  Mild mood disorder (HCC) -     nortriptyline (PAMELOR) 10 MG capsule; Take 4 capsules po qd x 10  days, then decrease to 3 capsules for 10 days -     nortriptyline (PAMELOR) 25 MG capsule; Take 1 capsule (25 mg total) by mouth every morning. -     lurasidone (LATUDA) 80 MG TABS tablet; Take 1 tablet (80 mg total) by mouth daily with supper.  Generalized anxiety disorder     Please see After Visit Summary for patient specific instructions.  Future Appointments  Date Time Provider [place]  10/22/2019  2:00 PM Lina Sayre, [facility] CP-CP None  10/29/2019  2:00 PM Lina Sayre, [facility] CP-CP None  11/05/2019  2:00 PM Lina Sayre, [facility] CP-CP None  11/12/2019  2:00 PM Lina Sayre, [facility] CP-CP None  11/15/2019  1:30 PM Thayer Headings, [facility] CP-CP None  11/19/2019  2:00 PM Lina Sayre, [facility] CP-CP None  11/26/2019  2:00 PM Lina Sayre, [facility] CP-CP None  01/21/2020  2:00 PM Lina Sayre, [facility] CP-CP None    No orders of the defined types were placed in this encounter.   -------------------------------
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
## 2019-10-22 ENCOUNTER — Other Ambulatory Visit: Payer: Self-pay

## 2019-10-22 ENCOUNTER — Ambulatory Visit (INDEPENDENT_AMBULATORY_CARE_PROVIDER_SITE_OTHER): Payer: PRIVATE HEALTH INSURANCE | Admitting: Psychiatry

## 2019-10-22 DIAGNOSIS — F4310 Post-traumatic stress disorder, unspecified: Secondary | ICD-10-CM | POA: Diagnosis not present

## 2019-10-22 NOTE — Progress Notes (Signed)
      [facility] Counselor/Therapist Progress Note  Patient ID: Peggy Ruiz, MRN: [id number],    Date: 10/22/2019  Time Spent: 51 minutes start time 2:07 PM end time 2:58 PM  Treatment Type: Individual Therapy  Reported Symptoms: anxiety, triggered responses, frustration, poor physical symptoms, sadness, fatigue  Mental Status Exam:  Appearance:   Well Groomed     Behavior:  Appropriate  Motor:  Normal  Speech/Language:   Normal Rate  Affect:  Appropriate  Mood:  normal  Thought process:  normal  Thought content:    WNL  Sensory/Perceptual disturbances:    WNL  Orientation:  oriented to person, place, time/date and situation  Attention:  Good  Concentration:  Good  Memory:  WNL  Fund of knowledge:   Good  Insight:    Good  Judgment:   Good  Impulse Control:  Good   Risk Assessment: Danger to Self:  No Self-injurious Behavior: No Danger to Others: No Duty to Warn:no Physical Aggression / Violence:No  Access to Firearms a concern: No  Gang Involvement:No   Subjective: Patient was present for session.  She shared she that she has under gone testing and it let her know that she has lots of with her body physically and emotionally.  She reported that it reported she is a loving person, and she is hurt by negative words. She went on to share she was told that the EMDR should help her heal physically and emotionally.  She shared she is frustrated with her body because she doesn't have the energy to do what she wants to do that she gets angry and depressed and she realizes that it gets worse.  Did EMDR set up with patient on something she had been unable to share anybody.  Picture him being inside me, suds level 10, negative cognition "what I want does not matter" felt anger in her head and her chest.  Patient was able to reduce suds level to 6.  She was able to report feeling some release after the set.  Discussed ways for her to release emotions that may surface after session.   Encouraged her to work on journaling to release emotions appropriately.  Interventions: Solution-Oriented/Positive Psychology and Eye Movement Desensitization and Reprocessing (EMDR)  Diagnosis:   ICD-10-CM   1. Posttraumatic stress disorder  F43.10     Plan: Patient is to use coping skills to decrease triggered emotions.  Patient is to work on Engineer, water to release emotions in an appropriate manner. Long-term goal: Interacting normally with friends and family without irrational fears or intrusive thoughts that control behavior.  Display a full range of emotions without experiencing loss of control Short-term goal: Identify and replace negative self talk and catastrophic sizing that is associated with past trauma and current stimulus triggers for anxiety  Stevphen Meuse, [facility]

## 2019-10-29 ENCOUNTER — Ambulatory Visit (INDEPENDENT_AMBULATORY_CARE_PROVIDER_SITE_OTHER): Payer: PRIVATE HEALTH INSURANCE | Admitting: Psychiatry

## 2019-10-29 ENCOUNTER — Other Ambulatory Visit: Payer: Self-pay

## 2019-10-29 DIAGNOSIS — F4310 Post-traumatic stress disorder, unspecified: Secondary | ICD-10-CM | POA: Diagnosis not present
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
## 2019-11-05 ENCOUNTER — Ambulatory Visit (INDEPENDENT_AMBULATORY_CARE_PROVIDER_SITE_OTHER): Payer: PRIVATE HEALTH INSURANCE | Admitting: Psychiatry

## 2019-11-05 ENCOUNTER — Other Ambulatory Visit: Payer: Self-pay

## 2019-11-05 DIAGNOSIS — F4310 Post-traumatic stress disorder, unspecified: Secondary | ICD-10-CM | POA: Diagnosis not present
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
## 2019-11-12 ENCOUNTER — Other Ambulatory Visit: Payer: Self-pay

## 2019-11-12 ENCOUNTER — Ambulatory Visit (INDEPENDENT_AMBULATORY_CARE_PROVIDER_SITE_OTHER): Payer: PRIVATE HEALTH INSURANCE | Admitting: Psychiatry

## 2019-11-12 DIAGNOSIS — F4310 Post-traumatic stress disorder, unspecified: Secondary | ICD-10-CM
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
## 2019-11-15 ENCOUNTER — Ambulatory Visit: Payer: PRIVATE HEALTH INSURANCE | Admitting: Psychiatry

## 2019-11-18 ENCOUNTER — Other Ambulatory Visit: Payer: Self-pay

## 2019-11-18 DIAGNOSIS — F4310 Post-traumatic stress disorder, unspecified: Secondary | ICD-10-CM

## 2019-11-18 DIAGNOSIS — F411 Generalized anxiety disorder: Secondary | ICD-10-CM

## 2019-11-18 MED ORDER — CLONIDINE HCL 0.1 MG PO TABS: ORAL_TABLET | ORAL | 0 refills | Status: DC

## 2019-11-19 ENCOUNTER — Other Ambulatory Visit: Payer: Self-pay

## 2019-11-19 ENCOUNTER — Ambulatory Visit (INDEPENDENT_AMBULATORY_CARE_PROVIDER_SITE_OTHER): Payer: PRIVATE HEALTH INSURANCE | Admitting: Psychiatry

## 2019-11-19 DIAGNOSIS — F4310 Post-traumatic stress disorder, unspecified: Secondary | ICD-10-CM | POA: Diagnosis not present

## 2019-11-19 NOTE — Progress Notes (Signed)
[facility] Counselor/Therapist Progress Note  Patient ID: Peggy Ruiz, MRN: [id number],    Date: 11/19/2019  Time Spent: 52 minutes start time 2:05 PM end time 2:57 PM  Treatment Type: Individual Therapy  Reported Symptoms: sleep issues, crying spells, anxiety, panic, sadness, triggered responses  Mental Status Exam:  Appearance:   Well Groomed     Behavior:  Appropriate  Motor:  Normal  Speech/Language:   Normal Rate  Affect:  Appropriate  Mood:  depressed  Thought process:  normal  Thought content:    WNL  Sensory/Perceptual disturbances:    WNL  Orientation:  oriented to person, place, time/date and situation  Attention:  Good  Concentration:  Good  Memory:  WNL  Fund of knowledge:   Good  Insight:    Good  Judgment:   Good  Impulse Control:  Good   Risk Assessment: Danger to Self:  No Self-injurious Behavior: No Danger to Others: No Duty to Warn:no Physical Aggression / Violence:No  Access to Firearms a concern: No  Gang Involvement:No   Subjective: Patient was present for session.  She shared that she was not at a good place.  She had to spend the whole day with her husband yesterday due to him having to go to doctors appointments.  She shared that it appears he will be having  knee surgery and will be out of work to recover 4 to 6 weeks.  Patient stated the thought of all of it has panicked her.  She shared that she is not sure what will happen because she remembers what it is like to recover from surgeries due to her own.  She explained that Peggy Ruiz was at a good place during her surgery and that her parents were at a better health place so they helped get her taking care of.  She shared that she is still not at a good health place and both of her parents are looking at having to have potential knee surgeries as well so the thought of all of it is overwhelming her.  Patient expressed frustration over the fact that she is trying to work on healing from her past  and as she is doing that it seems like more issues surface and that keeps her from having the ability to deal with all the different issues from her past.  Patient also shared that her migraine specialist had mentioned the work of Dr. Annamary Rummage to her.  Patient was given handouts from a training that clinician had attended by him.  She was also encouraged to get the book the body keeps his score as well as the workbook that goes with that to start working on some of her issues.  Patient reported reviews and said it was very intense.  Agreed that it may be an intense book but she is also worked on a lot of her issues through EMDR so she should be in an appropriate place to manage the emotions that will surface.  Also agreed to work on the trauma more at next session and then discussed different coping skills that she can use to help her manage what gets triggered.  Patient reported feeling better at the end of session.  Interventions: Solution-Oriented/Positive Psychology  Diagnosis:   ICD-10-CM   1. Posttraumatic stress disorder  F43.10     Plan: Patient is to use CBT and coping skills to manage triggered responses appropriately.  Patient is to get the book by Dr. Annamary Rummage the body  keeps the score as well as the workbook to start working through more of her traumas.  Patient is to work on her affirmations regularly and remind herself that somehow things will work out. Long-term goal: Interacting normally with friends and family without irrational fears or intrusive thoughts that control behavior.  Display a full range of emotions without experiencing loss of control Short-term goal: Identify and replace negative self talk and catastrophic sizing that is associated with past trauma and current stimulus triggers for anxiety  Stevphen Meuse, [facility]

## 2019-11-20 ENCOUNTER — Encounter: Payer: Self-pay | Admitting: Psychiatry

## 2019-11-20 ENCOUNTER — Ambulatory Visit (INDEPENDENT_AMBULATORY_CARE_PROVIDER_SITE_OTHER): Payer: PRIVATE HEALTH INSURANCE | Admitting: Psychiatry

## 2019-11-20 DIAGNOSIS — F411 Generalized anxiety disorder: Secondary | ICD-10-CM

## 2019-11-20 DIAGNOSIS — F39 Unspecified mood [affective] disorder: Secondary | ICD-10-CM

## 2019-11-20 DIAGNOSIS — F4310 Post-traumatic stress disorder, unspecified: Secondary | ICD-10-CM | POA: Diagnosis not present

## 2019-11-20 MED ORDER — CLONAZEPAM 0.5 MG PO TABS: ORAL_TABLET | ORAL | 2 refills | Status: DC

## 2019-11-20 MED ORDER — NORTRIPTYLINE HCL 10 MG PO CAPS: 10.0000 mg | ORAL_CAPSULE | Freq: Every day | ORAL | 0 refills | Status: DC

## 2019-11-20 MED ORDER — OXCARBAZEPINE 600 MG PO TABS: 600.0000 mg | ORAL_TABLET | Freq: Every day | ORAL | 1 refills | Status: DC

## 2019-11-20 MED ORDER — CHLORDIAZEPOXIDE HCL 10 MG PO CAPS: ORAL_CAPSULE | ORAL | 2 refills | Status: DC

## 2019-11-20 MED ORDER — SERTRALINE HCL 50 MG PO TABS: ORAL_TABLET | ORAL | 1 refills | Status: DC
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
## 2019-11-26 ENCOUNTER — Ambulatory Visit (INDEPENDENT_AMBULATORY_CARE_PROVIDER_SITE_OTHER): Payer: PRIVATE HEALTH INSURANCE | Admitting: Psychiatry

## 2019-11-26 ENCOUNTER — Other Ambulatory Visit: Payer: Self-pay

## 2019-11-26 DIAGNOSIS — F4310 Post-traumatic stress disorder, unspecified: Secondary | ICD-10-CM

## 2019-11-26 NOTE — Progress Notes (Signed)
[facility] Counselor/Therapist Progress Note  Patient ID: ADASIA HOAR, MRN: [id number],    Date: 11/26/2019  Time Spent: 51 minutes start time 2:05 PM end time 2:56 PM  Treatment Type: Individual Therapy  Reported Symptoms: anxiety, triggered response, sadness, panic  Mental Status Exam:  Appearance:   Casual and Neat     Behavior:  Appropriate  Motor:  Normal  Speech/Language:   Normal Rate  Affect:  Appropriate  Mood:  anxious  Thought process:  normal  Thought content:    WNL  Sensory/Perceptual disturbances:    WNL  Orientation:  oriented to person, place, time/date and situation  Attention:  Good  Concentration:  Good  Memory:  WNL  Fund of knowledge:   Good  Insight:    Good  Judgment:   Good  Impulse Control:  Good   Risk Assessment: Danger to Self:  No Self-injurious Behavior: No Danger to Others: No Duty to Warn:no Physical Aggression / Violence:No  Access to Firearms a concern: No  Gang Involvement:No   Subjective: Patient was present for session. She reported that they are trying to figure out what to do to have contact with Aaron Edelman.  Discussed a plan to try if they choose to have Aaron Edelman at [facility] Day.  Patient was reminded that she had shared that he acts up when there is attention on her primarily.  Discussed the importance of keeping the focus of the attention on him for that time and if it is changed and he starts acting out different ways to redirect him were discussed with patient.  Patient reported feeling good about the plan that was developed and felt that she could talk to her husband and parents about it and get everybody on the same page.  If Father's Day does not go well she was encouraged to call [place], [facility], [place] at the practice if she needs to process it with someone else to come up with another way to manage the situation with him.  Patient reported feeling much better having a plan that she felt would work and knowing that if it  does not work she has somebody to follow-up with that can help her process through the situation appropriately.  Also discussed different coping skills and strategies to help patient get through the next 6 weeks and stay grounded.  Patient also shared that she is considering some other options with her husband surgery.  Discussed those ideas and the importance of her having a plan.  Was able to recognize her life is always been in chaos and when she has a plan she feels some sort of stability which helps her calm down.  Wrote out the different skills and strategies on an index card for patient to take home and keep somewhere where she can look at it.  Interventions: Cognitive Behavioral Therapy and Solution-Oriented/Positive Psychology  Diagnosis:   ICD-10-CM   1. Posttraumatic stress disorder  F43.10     Plan: Patient is to use CBT and coping skills to decrease triggered responses and handle emotions appropriately.  Patient is to try plan discussed in session when having contact with her brother.  Patient is to continue taking medication as directed and to work on self-care. Long-term goal: Interacting normally with friends and family without irrational fears or intrusive thoughts that control behavior.  Display a full range of emotions without losing control Short-term goal: Identify and replace negative self talk and catastrophic sizing that is associated with past  trauma and current stimulus triggers for anxiety   Stevphen Meuse, [facility]

## 2019-12-04 ENCOUNTER — Other Ambulatory Visit: Payer: Self-pay

## 2019-12-04 ENCOUNTER — Ambulatory Visit (INDEPENDENT_AMBULATORY_CARE_PROVIDER_SITE_OTHER): Payer: PRIVATE HEALTH INSURANCE | Admitting: Psychiatry

## 2019-12-04 DIAGNOSIS — F4310 Post-traumatic stress disorder, unspecified: Secondary | ICD-10-CM | POA: Diagnosis not present

## 2019-12-04 NOTE — Progress Notes (Signed)
      [facility] Counselor/Therapist Progress Note  Patient ID: Peggy Ruiz, MRN: [id number],    Date: 12/04/2019  Time Spent: 52 minutes start time 3:05 PM end time 3:57 PM  Treatment Type: Individual Therapy  Reported Symptoms: anxiety, loss of appetite, panic, triggered responses, crying spell, sadness  Mental Status Exam:  Appearance:   Well Groomed     Behavior:  Appropriate  Motor:  Normal  Speech/Language:   Normal Rate  Affect:  Appropriate  Mood:  anxious  Thought process:  normal  Thought content:    WNL  Sensory/Perceptual disturbances:    WNL  Orientation:  oriented to person, place, time/date and situation  Attention:  Good  Concentration:  Good  Memory:  WNL  Fund of knowledge:   Good  Insight:    Good  Judgment:   Good  Impulse Control:  Good   Risk Assessment: Danger to Self:  No Self-injurious Behavior: No Danger to Others: No Duty to Warn:no Physical Aggression / Violence:No  Access to Firearms a concern: No  Gang Involvement:No   Subjective: Patient was present for session.  She shared she has lost her appetite and is having some dizzy spells.  She went on to share that she was triggered by a TV show.  Patient discussed what surface to the TV show and how it impacted her.  Patient was able to discuss issues between she and her husband concerning physical intimacy.  She acknowledged that her trauma was impacting their relationship in a negative manner and she wanted to figure out how to make things more appropriate.  Discussed different ways that she can try some exposure therapy to see if that is helpful.  Reminded of patient of mindfulness breathing and the importance to use her self talk when she is interacting with her husband.  Patient was encouraged to try and figure out her body more so she can work with him in a safe manner.  Patient agreed to try and figure out how to use CBT skills and relaxation skills discussed in session to help with the  physical intimacy in her marriage.  Interventions: Cognitive Behavioral Therapy and Solution-Oriented/Positive Psychology  Diagnosis:   ICD-10-CM   1. Posttraumatic stress disorder  F43.10     Plan: Patient is to use CBT and coping skills to decrease triggered responses.  Patient is to work on plans from session to help the physical intimacy in her marriage that is being stifle due to triggered responses.  Patient is to continue working on regular affirmations and relaxation exercises. Long-term goal: Interact normally with friends and family without irrational fears or intrusive thoughts that control behaviors.  Display a full range of emotions without experiencing loss of control Short-term goal: Identify and replace negative self talk and catastrophic sizing that is associated with past trauma and current stimuli triggers for anxiety  Stevphen Meuse, [facility]

## 2019-12-05 ENCOUNTER — Telehealth: Payer: Self-pay | Admitting: Psychiatry

## 2019-12-05 NOTE — Telephone Encounter (Signed)
Started Zoloft, tritrating to 100mg  on the first week, has been on it 5 days. Over this time on med she is having a dramatic decrease in appetite on it. Forces herself to eat.  Has lost 4 lbs in the last week. But it has helped with her anxiety and depression , but the fatigue is still pretty bad. Doesn't want to stop it completely b/c it is helping a little. Can she go down on the dose a little?

## 2019-12-05 NOTE — Telephone Encounter (Signed)
She's aware to decrease to 75 mg, she was taking in the am, so advised to take at hs and call back with an update if symptoms don't improve.

## 2019-12-25 ENCOUNTER — Other Ambulatory Visit: Payer: Self-pay

## 2019-12-25 ENCOUNTER — Ambulatory Visit (INDEPENDENT_AMBULATORY_CARE_PROVIDER_SITE_OTHER): Payer: PRIVATE HEALTH INSURANCE | Admitting: Psychiatry

## 2019-12-25 ENCOUNTER — Encounter: Payer: Self-pay | Admitting: Psychiatry

## 2019-12-25 DIAGNOSIS — F411 Generalized anxiety disorder: Secondary | ICD-10-CM | POA: Diagnosis not present

## 2019-12-25 DIAGNOSIS — F4310 Post-traumatic stress disorder, unspecified: Secondary | ICD-10-CM | POA: Diagnosis not present

## 2019-12-25 DIAGNOSIS — F39 Unspecified mood [affective] disorder: Secondary | ICD-10-CM

## 2019-12-25 MED ORDER — LURASIDONE HCL 80 MG PO TABS: 80.0000 mg | ORAL_TABLET | Freq: Every day | ORAL | 2 refills | Status: DC

## 2019-12-25 MED ORDER — SERTRALINE HCL 100 MG PO TABS: ORAL_TABLET | ORAL | 0 refills | Status: DC

## 2019-12-25 NOTE — Progress Notes (Signed)
Peggy ShadowLeah C Supple [id number] 07/16/1985 34 y.o.  Subjective:   Patient ID:  Peggy Ruiz is a 34 y.o. (DOB 02/15/1986) female.  Chief Complaint:  Chief Complaint  Patient presents with  . Anxiety  . Depression    HPI Peggy ShadowLeah C Demetro presents to the office today for follow-up of anxiety, depression, and insomnia. She reports that she is "barely eating or drinking." Has apt to see gastroenterologist on 01/10/20. She reports, "it feels like gastroparesis" and describes decreased appetite and early satiety. She reports that these s/s have seemed to worsen since Sertraline, particularly at the 100 mg dose. She reports that the last 5 days she has been eating soups, broths, gatorade, and saltine crackers. Reports avoiding foods that could trigger vomiting. She reports that GI s/s worsened with decrease in Sertraline from 100 mg to 75 mg po qd.  She reports that she has been "more depressed" and is unsure if this is related to circumstances. She reports that she has been sleeping more and some days throughout the day and then sleeping at night as well. She reports that her energy has been decreased due to limited nutrition. Motivation has also been low. She reports feeling "useless" in response to not being able to do things she usually would do. She reports that she has also been experiencing significant anxiety.   She reports recent passive death wishes.   Past Psychiatric Medication Trials: Cymbalta Prozac Lexapro Paxil- Initially helped and then did not seem to be as effective when used without Zyprexa Effexor Nortriptyline-prescribed by GI for gastroparesis Remeron-caused insomnia Buspar- Adverse reaction Abilify-involuntary movements Zyprexa-effective for a period of time. Caused excessive weight gain at times. Seroquel Latuda-effective. Sleeping "harder" on 80 mg. Lamictal Gabapentin- Cannot tolerate doses higher than 800 mg BID Trileptal- Reports that she was unable to tolerate 900  mg QHS Belsomra-helpful with sleep quality but not sleep quantity. Klonopin Ativan Librium Clonidine- Effective Propranolol Trazodone Ambien Nuvigil Adderall  AIMS     Office Visit from 02/25/2019 in [facility]  AIMS Total Score 0       Review of Systems:  Review of Systems  Gastrointestinal: Positive for abdominal pain, diarrhea and nausea. Negative for vomiting.       Reports epigastric pain.   Musculoskeletal: Negative for gait problem.  Neurological: Positive for dizziness. Negative for tremors.  Psychiatric/Behavioral:       Please refer to HPI    Medications: I have reviewed the patient's current medications.  Current Outpatient Medications  Medication Sig Dispense Refill  . azithromycin (ZITHROMAX) 250 MG tablet Take 250 mg by mouth as directed.    . chlordiazePOXIDE (LIBRIUM) 10 MG capsule Take 1 po q am and 2 po QHS 90 capsule 2  . clonazePAM (KLONOPIN) 0.5 MG tablet TAKE 1/2 TO 1 TABLET BY MOUTH IN THE MORNING AND TAKE 1 TABLET BY MOUTH AT BEDTIME 60 tablet 2  . cloNIDine (CATAPRES) 0.1 MG tablet Take 1 tab po QHS and 1 qd prn 180 tablet 0  . dexlansoprazole (DEXILANT) 60 MG capsule Take 60 mg by mouth every morning.    . gabapentin (NEURONTIN) 800 MG tablet Take 800 mg by mouth 2 (two) times daily.    Marland Kitchen. levonorgestrel-ethinyl estradiol (SEASONALE,INTROVALE,JOLESSA) 0.15-0.03 MG tablet Take 1 tablet by mouth daily.    Marland Kitchen. levothyroxine (SYNTHROID, LEVOTHROID) 50 MCG tablet Take 50 mcg by mouth daily.    Marland Kitchen. Lifitegrast (XIIDRA) 5 % SOLN Apply to eye.    . lurasidone (LATUDA) 80  MG TABS tablet Take 1 tablet (80 mg total) by mouth daily with supper. 30 tablet 2  . magnesium 30 MG tablet Take 30 mg by mouth 2 (two) times daily.    . methocarbamol (ROBAXIN) 750 MG tablet Take 750 mg by mouth every 8 (eight) hours as needed.     . methylPREDNISolone (MEDROL DOSEPAK) 4 MG TBPK tablet     . Multiple Vitamin (MULTIVITAMIN) tablet Take 1 tablet by mouth  daily.    . nortriptyline (PAMELOR) 10 MG capsule Take 1 capsule (10 mg total) by mouth at bedtime. 90 capsule 0  . ondansetron (ZOFRAN-ODT) 8 MG disintegrating tablet Take 8 mg by mouth every 8 (eight) hours as needed for nausea.    Marland Kitchen oxcarbazepine (TRILEPTAL) 600 MG tablet Take 1 tablet (600 mg total) by mouth at bedtime. 90 tablet 1  . Probiotic Product (PROBIOTIC DAILY PO) Take by mouth.    . sertraline (ZOLOFT) 100 MG tablet Take 1-1.5 tabs po qd 135 tablet 0   No current facility-administered medications for this visit.    Medication Side Effects: Other: Possible GI s/s  Allergies:  Allergies  Allergen Reactions  . Promethazine     Other reaction(s): Agitation  . Ciprofloxacin Other (See Comments)    c-diff  . Dilaudid [Hydromorphone]     sob  . Epinephrine   . Penicillins Hives    Has patient had a PCN reaction causing immediate rash, facial/tongue/throat swelling, SOB or lightheadedness with hypotension: Yes Has patient had a PCN reaction causing severe rash involving mucus membranes or skin necrosis: No Has patient had a PCN reaction that required hospitalization: No Has patient had a PCN reaction occurring within the last 10 years: Yes If all of the above answers are "NO", then may proceed with Cephalosporin use.   . Toradol [Ketorolac Tromethamine] Nausea And Vomiting    Past Medical History:  Diagnosis Date  . Anorexia    at 15  . Anxiety   . Attention deficit disorder (ADD)   . Bone spur   . Depression   . Eczema   . HPV (human papilloma virus) infection     Family History  Problem Relation Age of Onset  . Fibromyalgia Mother   . Anxiety disorder Mother   . Depression Mother   . Depression Father   . Anxiety disorder Father     Social History   Socioeconomic History  . Marital status: Single    Spouse name: Not on file  . Number of children: Not on file  . Years of education: Not on file  . Highest education level: Not on file  Occupational  History  . Not on file  Tobacco Use  . Smoking status: Never Smoker  . Smokeless tobacco: Never Used  Vaping Use  . Vaping Use: Never used  Substance and Sexual Activity  . Alcohol use: Yes    Comment: occasion  . Drug use: No  . Sexual activity: Never  Other Topics Concern  . Not on file  Social History Narrative   Regular exercise: none   Caffeine use: daily   Social Determinants of Health   Financial Resource Strain:   . Difficulty of Paying Living Expenses:   Food Insecurity:   . Worried About Programme researcher, broadcasting/film/video in the Last Year:   . Barista in the Last Year:   Transportation Needs:   . Freight forwarder (Medical):   Marland Kitchen Lack of Transportation (Non-Medical):   Physical Activity:   .  Days of Exercise per Week:   . Minutes of Exercise per Session:   Stress:   . Feeling of Stress :   Social Connections:   . Frequency of Communication with Friends and Family:   . Frequency of Social Gatherings with Friends and Family:   . Attends Religious Services:   . Active Member of Clubs or Organizations:   . Attends Banker Meetings:   Marland Kitchen Marital Status:   Intimate Partner Violence:   . Fear of Current or Ex-Partner:   . Emotionally Abused:   Marland Kitchen Physically Abused:   . Sexually Abused:     Past Medical History, Surgical history, Social history, and Family history were reviewed and updated as appropriate.   Please see review of systems for further details on the patient's review from today.   Objective:   Physical Exam:  BP 101/60   Pulse 81   Wt 151 lb (68.5 kg)   BMI 22.30 kg/m   Physical Exam Constitutional:      General: She is not in acute distress. Musculoskeletal:        General: No deformity.  Neurological:     Mental Status: She is alert and oriented to person, place, and time.     Coordination: Coordination normal.  Psychiatric:        Attention and Perception: Attention and perception normal. She does not perceive auditory or  visual hallucinations.        Mood and Affect: Mood is anxious and depressed. Affect is not labile, blunt, angry or inappropriate.        Speech: Speech normal.        Behavior: Behavior normal.        Thought Content: Thought content normal. Thought content is not paranoid or delusional. Thought content does not include homicidal or suicidal ideation. Thought content does not include homicidal or suicidal plan.        Cognition and Memory: Cognition and memory normal.        Judgment: Judgment normal.     Comments: Insight intact     Lab Review:     Component Value Date/Time   NA 139 06/26/2017 0025   NA 138 08/31/2011 1624   K 3.7 06/26/2017 0025   K 4.1 08/31/2011 1624   CL 105 06/26/2017 0025   CL 102 08/31/2011 1624   CO2 25 06/26/2017 0025   CO2 27 08/31/2011 1624   GLUCOSE 110 (H) 06/26/2017 0025   GLUCOSE 81 08/31/2011 1624   BUN 9 06/26/2017 0025   BUN 10 08/31/2011 1624   CREATININE 0.88 06/26/2017 0025   CREATININE 0.88 08/31/2011 1624   CALCIUM 9.1 06/26/2017 0025   CALCIUM 9.1 08/31/2011 1624   PROT 7.4 06/26/2017 0025   PROT 7.8 08/31/2011 1624   ALBUMIN 4.5 06/26/2017 0025   ALBUMIN 4.0 08/31/2011 1624   AST 19 06/26/2017 0025   AST 25 08/31/2011 1624   ALT 14 06/26/2017 0025   ALT 24 08/31/2011 1624   ALKPHOS 49 06/26/2017 0025   ALKPHOS 52 08/31/2011 1624   BILITOT 0.5 06/26/2017 0025   BILITOT 0.3 08/31/2011 1624   GFRNONAA >60 06/26/2017 0025   GFRNONAA >60 08/31/2011 1624   GFRAA >60 06/26/2017 0025   GFRAA >60 08/31/2011 1624       Component Value Date/Time   WBC 6.4 06/26/2017 0025   RBC 3.85 06/26/2017 0025   HGB 12.1 06/26/2017 0025   HGB 12.7 08/31/2011 1624   HCT 34.8 (L) 06/26/2017  0025   HCT 37.7 08/31/2011 1624   PLT 246 06/26/2017 0025   PLT 248 08/31/2011 1624   MCV 90.3 06/26/2017 0025   MCV 90 08/31/2011 1624   MCH 31.5 06/26/2017 0025   MCHC 34.9 06/26/2017 0025   RDW 11.9 06/26/2017 0025   RDW 12.6 08/31/2011 1624    LYMPHSABS 2.2 04/07/2017 1145   MONOABS 0.4 04/07/2017 1145   EOSABS 0.1 04/07/2017 1145   BASOSABS 0.0 04/07/2017 1145    No results found for: POCLITH, LITHIUM   No results found for: PHENYTOIN, PHENOBARB, VALPROATE, CBMZ   .res Assessment: Plan:   Discussed potential benefits of pharmacogenetic testing and reviewed sample report with patient.  Patient consents to pharmacogenetic testing and saliva sample obtained at time of exam. Discussed course of nausea to include timing and exacerbations in relation to initiation and increases of sertraline.  Discussed that GI signs and symptoms worsened when patient decreased sertraline and patient reports that GI signs and symptoms are consistent with the signs and symptoms she has experienced in the past with gastroparesis.  Therefore, discussed attempting to gradually increase sertraline to 150 mg po qd as tolerated or continuing highest dose tolerated.  Patient advised to contact office if she experiences any acute worsening of GI signs and symptoms with increase in sertraline. Will decrease Clonidine to HS and prn only since patient reports that she is currently taking clonidine 0.1 mg one half tab p.o. every morning and 1 tab p.o. nightly since patient has been experiencing low heart rate and dizziness at times. Will continue all other medications without changes at this time. Agree with plan to follow-up with GI specialist as soon as possible. Recommend continuing psychotherapy with Stevphen Meuse, LC [facility]. Patient to follow-up with this provider in 4 weeks or sooner if clinically indicated. Patient advised to contact office with any questions, adverse effects, or acute worsening in signs and symptoms.  Ruba was seen today for anxiety and depression.  Diagnoses and all orders for this visit:  PTSD (post-traumatic stress disorder) -     sertraline (ZOLOFT) 100 MG tablet; Take 1-1.5 tabs po qd  Mild mood disorder (HCC) -     sertraline (ZOLOFT)  100 MG tablet; Take 1-1.5 tabs po qd -     lurasidone (LATUDA) 80 MG TABS tablet; Take 1 tablet (80 mg total) by mouth daily with supper.  Generalized anxiety disorder -     sertraline (ZOLOFT) 100 MG tablet; Take 1-1.5 tabs po qd     Please see After Visit Summary for patient specific instructions.  Future Appointments  Date Time Provider Department Center  01/21/2020  2:00 PM Stevphen Meuse, [facility] None  01/23/2020  1:00 PM Corie Chiquito, PMHNP CP-CP None  01/28/2020  2:00 PM Stevphen Meuse, [facility] CP-CP None  02/04/2020  2:00 PM Stevphen Meuse, [facility] CP-CP None  02/18/2020  2:00 PM Stevphen Meuse, [facility] CP-CP None    No orders of the defined types were placed in this encounter.   -------------------------------

## 2020-01-06 ENCOUNTER — Telehealth: Payer: Self-pay | Admitting: Psychiatry

## 2020-01-06 NOTE — Telephone Encounter (Signed)
Pt said that when she started taking Zoloft 100mg  she noticed that it made her feel a little jittery and she could deal with it. Now that she is at 150mg  she said that she can not tolerate how she feels. Pt wants to know is she should go back to 100mg  or stop completely.

## 2020-01-07 NOTE — Telephone Encounter (Signed)
Peggy Ruiz is aware and will follow up after her GI apt.

## 2020-01-10 NOTE — Telephone Encounter (Signed)
Patient aware and will take on sertraline 50 mg until next apt 01/23/2020 unless anything else happens. Patient has medication at home to use. No additional questions or concerns.

## 2020-01-17 ENCOUNTER — Other Ambulatory Visit: Payer: Self-pay

## 2020-01-17 ENCOUNTER — Ambulatory Visit (INDEPENDENT_AMBULATORY_CARE_PROVIDER_SITE_OTHER): Payer: PRIVATE HEALTH INSURANCE | Admitting: Psychiatry

## 2020-01-17 DIAGNOSIS — F4310 Post-traumatic stress disorder, unspecified: Secondary | ICD-10-CM

## 2020-01-17 NOTE — Progress Notes (Signed)
[facility] Counselor/Therapist Progress Note  Patient ID: IASIA FORCIER, MRN: [id number],    Date: 01/17/2020  Time Spent: 52 minutes start time 2:10 PM end time 3:02 PM  Treatment Type: Individual Therapy  Reported Symptoms: stomach issues, triggered responses, anxiety, panic, fatigue  Mental Status Exam:  Appearance:   Well Groomed     Behavior:  Appropriate  Motor:  Restlestness  Speech/Language:   Normal Rate  Affect:  Appropriate  Mood:  anxious  Thought process:  normal  Thought content:    WNL  Sensory/Perceptual disturbances:    WNL  Orientation:  oriented to person, place, time/date and situation  Attention:  Good  Concentration:  Good  Memory:  WNL  Fund of knowledge:   Good  Insight:    Good  Judgment:   Good  Impulse Control:  Good   Risk Assessment: Danger to Self:  No Self-injurious Behavior: No Danger to Others: No Duty to Warn:no Physical Aggression / Violence:No  Access to Firearms a concern: No  Gang Involvement:No   Subjective: Patient was present for session.  She reported that she had not been doing well.  She shared that her health issues continue to decline and she is not sure what will help things to get better.  She is working with her provider as well as her gastrologist to figure out what the issue is with her stomach.  Patient went on to share she is concerned that there are some environmental issues with her apartment that are impacting her health as well.  Patient stated she is feeling very overwhelmed and having a hard time staying away out of her situation.  Patient went on to explain that she and her husband are trying to figure some things out and are wanting to move.  She went on to share that due to the housing market situation they are going to be unable to purchase the home right now and don't know what they can do to help things change.  Patient stated she is starting to feel very hopeless and stuck.  Encourage patient to focus on  where she finds hope.  She was also encouraged to recognize there may be options that she is not even aware of yet that organ to come open at any time.  Especially since there should be some changes with the housing situation in the next few months.  Patient was encouraged to think about resources that she has and people that she can talk to that may know the things that she will not be able to find on her own.  She was able to report that the church she is currently going to does have some resources and she is willing to try and reach out to them for assistance.  Patient was encouraged to try and find ways to get out of her home if nothing else to get another scenery whenever possible.  Patient reported that her husband will be home over the weekend they will get out of the house some together.  She shared on Monday she will could go to her parents and then she would be out of her home possibly she was spent the night with him and then come to session on Tuesday.  Patient reported that eating is also a great issue so she is getting continue focusing on trying to find things that she can eat that give her some nutritional value.  She was reminded of coping skills that she can  use to change her self talk and the importance of focusing on the things that potentially could happen in the things that she can do rather than the things that she cannot control at this time.  Patient agreed to work on those things over the next week and reported that she was safe at this time.  Interventions: Cognitive Behavioral Therapy and Solution-Oriented/Positive Psychology  Diagnosis:   ICD-10-CM   1. PTSD (post-traumatic stress disorder)  F43.10     Plan: Patient is to use CBT and coping skills to decrease mood issues and triggered responses.  Patient is to reach out to her church family and to see if anyone knows of a place that they could move to.  Patient is to work on being out of her apartment as much as possible.   Patient is to focus on eating foods that have them most nutritional value possible that she can keep down. Long-term goal: Interact normally with friends and family without irrational fears or intrusive thoughts that control behavior.  Display a full range of emotions without experiencing loss of control Short-term goal: Identify and replace negative self talk and catastrophic sizing that is associated with past trauma and current stimulus triggers for anxiety  Stevphen Meuse, [facility]

## 2020-01-21 ENCOUNTER — Ambulatory Visit (INDEPENDENT_AMBULATORY_CARE_PROVIDER_SITE_OTHER): Payer: PRIVATE HEALTH INSURANCE | Admitting: Psychiatry

## 2020-01-21 ENCOUNTER — Other Ambulatory Visit: Payer: Self-pay

## 2020-01-21 DIAGNOSIS — F4310 Post-traumatic stress disorder, unspecified: Secondary | ICD-10-CM

## 2020-01-21 NOTE — Progress Notes (Signed)
[facility] Counselor/Therapist Progress Note  Patient ID: Peggy Ruiz, MRN: [id number],    Date: 01/21/2020  Time Spent: 52 minutes start time 2:04 PM end time 2:56 PM  Treatment Type: Individual Therapy  Reported Symptoms: anxiety, triggered responses, sadness, fatigue  Mental Status Exam:  Appearance:   Well Groomed     Behavior:  Appropriate  Motor:  Normal  Speech/Language:   Normal Rate  Affect:  Appropriate  Mood:  anxious  Thought process:  normal  Thought content:    WNL  Sensory/Perceptual disturbances:    WNL  Orientation:  oriented to person, place, time/date and situation  Attention:  Good  Concentration:  Good  Memory:  WNL  Fund of knowledge:   Good  Insight:    Good  Judgment:   Good  Impulse Control:  Good   Risk Assessment: Danger to Self:  No Self-injurious Behavior: No Danger to Others: No Duty to Warn:no Physical Aggression / Violence:No  Access to Firearms a concern: No  Gang Involvement:No   Subjective: Patient was present for session.  Patient reported she felt much better after last session.  She explained that she had tried the mint leaves in her tea and found them to be helpful but her stomach issues are improving with the decrease in Zoloft.  Patient explained that they had found an apartment that they were going to try and rent and they were go to be on the wait list.  She may need a letter for her cat that is therapeutic to her.  She explained that there is 1 cat in her household that stays with her whenever she is sick and helps her to calm down.  If a letter is needed clinician agreed to write one for her.  Patient shared she and her husband got into an argument which is not typical for them over the weekend.  She explained what triggered the argument.  She was able to acknowledge that she had been very triggered but was not exactly sure why.  Allowed patient time to discuss it and then asked her to think back when she had felt that way  in the past.  Patient was unable to identify when she was 7 that is when her stomach issues started and at that time she felt that her brothers needs were more important than hers and that she did not have a voice.  Patient was able to recognize and discussed the dynamic within her family system that had been a pattern throughout her life.  As she discussed that she was able to recognize that is what had been triggered by the argument with her husband.  Discussed ways that she can recognize that in the future and how to talk herself through that.  Encouraged patient to discuss the situation with her husband and talk to him about tools that could help as well.  Interventions: Cognitive Behavioral Therapy and Solution-Oriented/Positive Psychology  Diagnosis:   ICD-10-CM   1. Posttraumatic stress disorder  F43.10     Plan: Patient is to use CBT and coping skills to decrease triggered responses.  Patient is to continue using mint leaves in her tea to help decrease stomach issues that seem to be triggered with anxiety.  Patient is to communicate with her husband about what she had realized in session triggered her anxiety.  Patient is to practice reminding herself of the things that she needs to know when that part of her is triggered. Long-term  goal: Interact normally with friends and family without irrational fear or intrusive thoughts that control behavior.  Display a full range of emotions without experiencing loss of control Short-term goal: Identify and replace negative self talk and catastrophic sizing that is associated with past trauma and current stimulus triggers for anxiety  Stevphen Meuse, [facility]

## 2020-01-23 ENCOUNTER — Ambulatory Visit (INDEPENDENT_AMBULATORY_CARE_PROVIDER_SITE_OTHER): Payer: PRIVATE HEALTH INSURANCE | Admitting: Psychiatry

## 2020-01-23 ENCOUNTER — Other Ambulatory Visit: Payer: Self-pay

## 2020-01-23 VITALS — BP 111/76 | HR 81 | Wt 145.0 lb

## 2020-01-23 DIAGNOSIS — F411 Generalized anxiety disorder: Secondary | ICD-10-CM

## 2020-01-23 DIAGNOSIS — F39 Unspecified mood [affective] disorder: Secondary | ICD-10-CM

## 2020-01-23 DIAGNOSIS — E7212 Methylenetetrahydrofolate reductase deficiency: Secondary | ICD-10-CM

## 2020-01-23 DIAGNOSIS — F4310 Post-traumatic stress disorder, unspecified: Secondary | ICD-10-CM | POA: Diagnosis not present

## 2020-01-23 DIAGNOSIS — Z1589 Genetic susceptibility to other disease: Secondary | ICD-10-CM

## 2020-01-23 MED ORDER — SERTRALINE HCL 100 MG PO TABS: ORAL_TABLET | ORAL | 0 refills | Status: DC

## 2020-01-23 MED ORDER — DEPLIN 15 15-90.314 MG PO CAPS: 15.0000 mg | ORAL_CAPSULE | Freq: Every day | ORAL | 0 refills | Status: DC

## 2020-01-23 MED ORDER — OXCARBAZEPINE 600 MG PO TABS: ORAL_TABLET | ORAL | 1 refills | Status: DC

## 2020-01-23 NOTE — Progress Notes (Signed)
Peggy Ruiz [id number] 05/17/1986 34 y.o.  Subjective:   Patient ID:  Peggy Ruiz is a 34 y.o. (DOB 1985/10/11) female.  Chief Complaint:  Chief Complaint  Patient presents with  . Anxiety  . Depression  . Insomnia    HPI [name] presents to the office today for follow-up of anxiety, depression, and insomnia. She reports that her GI specialist questions if Sertraline could be causing GI side effects. She reports that her GI s/s worsened with Sertraline increase to 100 mg po qd. Pt reports that since decrease in Sertraline that her acid reflux s/s have significantly improved. Her appetite has not changed. Able to eat more at dinner. Continues to have nausea. No longer having diarrhea. Reports epigastric pain. She does not identify any discontinuation s/s.   She reports frustration and sadness in response to situational stressors and this leads to crying. She reports depressed mood. Denies any significant worsening in mood or anxiety with decrease in Sertraline. Reports that she has had a few panic attacks since last visit. Reports that her HR has been getting above 100 easily. Reports BP readings have been 86/70 and 97/78 when she has had some dizziness. She reports that she had worsening anxiety when she stopped taking Clonidine in the morning and her HR went to 115. Energy and motivation have been low. Concentration has been impaired. Has not been able to read due to difficulty with focus. Reports feeling hopeless last week and had self-injurious ideation. Denies SI.   She reports poor sleep. Reports she has been restless at night. Difficulty staying asleep and experiencing multiple awakenings at night. In bed from 11 pm-6:15 am.   Concerned about mold in their apartment. Also had a higher radon reading in apartment. Considering moving to another apartment.   Past Psychiatric Medication Trials: Cymbalta Prozac Lexapro Paxil- Initially helped and then did not seem to be as  effective when used without Zyprexa Effexor Nortriptyline-prescribed by GI for gastroparesis Remeron-caused insomnia Buspar- Adverse reaction Abilify-involuntary movements Zyprexa-effective for a period of time. Caused excessive weight gain at times. Seroquel Latuda-effective. Sleeping "harder" on 80 mg. Lamictal Gabapentin- Cannot tolerate doses higher than 800 mg BID Trileptal- Reports that she was unable to tolerate 900 mg QHS Belsomra-helpful with sleep quality but not sleep quantity. Klonopin Ativan Librium Clonidine- Effective Propranolol Trazodone Ambien Nuvigil Adderall  She reports that Paxil, Zyprexa, and Klonopin were most effective combination for her s/s.   AIMS     Office Visit from 02/25/2019 in [facility]  AIMS Total Score 0       Review of Systems:  Review of Systems  Constitutional: Positive for unexpected weight change.  Gastrointestinal: Positive for abdominal pain and nausea. Negative for diarrhea and vomiting.       Has endoscopy scheduled 01/29/20  Musculoskeletal: Negative for gait problem.  Neurological: Positive for dizziness.  Psychiatric/Behavioral:       Please refer to HPI    Medications: I have reviewed the patient's current medications.  Current Outpatient Medications  Medication Sig Dispense Refill  . sertraline (ZOLOFT) 100 MG tablet Take 25 mg daily for 7-10 days, then stop 135 tablet 0  . azithromycin (ZITHROMAX) 250 MG tablet Take 250 mg by mouth as directed.    . chlordiazePOXIDE (LIBRIUM) 10 MG capsule Take 1 po q am and 2 po QHS 90 capsule 2  . clonazePAM (KLONOPIN) 0.5 MG tablet TAKE 1/2 TO 1 TABLET BY MOUTH IN THE MORNING AND TAKE 1 TABLET  BY MOUTH AT BEDTIME 60 tablet 2  . cloNIDine (CATAPRES) 0.1 MG tablet Take 1 tab po QHS and 1 qd prn 180 tablet 0  . dexlansoprazole (DEXILANT) 60 MG capsule Take 60 mg by mouth every morning.    . gabapentin (NEURONTIN) 800 MG tablet Take 800 mg by mouth 2 (two) times  daily.    Marland Kitchen L-Methylfolate-Algae (DEPLIN 15) 15-90.314 MG CAPS Take 15 mg by mouth daily. 24 capsule 0  . levonorgestrel-ethinyl estradiol (SEASONALE,INTROVALE,JOLESSA) 0.15-0.03 MG tablet Take 1 tablet by mouth daily.    Marland Kitchen levothyroxine (SYNTHROID, LEVOTHROID) 50 MCG tablet Take 50 mcg by mouth daily.    Marland Kitchen Lifitegrast (XIIDRA) 5 % SOLN Apply to eye.    . lurasidone (LATUDA) 80 MG TABS tablet Take 1 tablet (80 mg total) by mouth daily with supper. 30 tablet 2  . magnesium 30 MG tablet Take 30 mg by mouth 2 (two) times daily.    . methocarbamol (ROBAXIN) 750 MG tablet Take 750 mg by mouth every 8 (eight) hours as needed.     . methylPREDNISolone (MEDROL DOSEPAK) 4 MG TBPK tablet     . Multiple Vitamin (MULTIVITAMIN) tablet Take 1 tablet by mouth daily.    . nortriptyline (PAMELOR) 10 MG capsule Take 1 capsule (10 mg total) by mouth at bedtime. 90 capsule 0  . ondansetron (ZOFRAN-ODT) 8 MG disintegrating tablet Take 8 mg by mouth every 8 (eight) hours as needed for nausea.    Marland Kitchen oxcarbazepine (TRILEPTAL) 600 MG tablet Take 1/2 tab po QHS 90 tablet 1  . Probiotic Product (PROBIOTIC DAILY PO) Take by mouth.     No current facility-administered medications for this visit.    Medication Side Effects: Nausea, Abdominal Pain and Appetite Suppression  Allergies:  Allergies  Allergen Reactions  . Promethazine     Other reaction(s): Agitation  . Ciprofloxacin Other (See Comments)    c-diff  . Dilaudid [Hydromorphone]     sob  . Epinephrine   . Penicillins Hives    Has patient had a PCN reaction causing immediate rash, facial/tongue/throat swelling, SOB or lightheadedness with hypotension: Yes Has patient had a PCN reaction causing severe rash involving mucus membranes or skin necrosis: No Has patient had a PCN reaction that required hospitalization: No Has patient had a PCN reaction occurring within the last 10 years: Yes If all of the above answers are "NO", then may proceed with  Cephalosporin use.   . Toradol [Ketorolac Tromethamine] Nausea And Vomiting    Past Medical History:  Diagnosis Date  . Anorexia    at 15  . Anxiety   . Attention deficit disorder (ADD)   . Bone spur   . Depression   . Eczema   . HPV (human papilloma virus) infection     Family History  Problem Relation Age of Onset  . Fibromyalgia Mother   . Anxiety disorder Mother   . Depression Mother   . Depression Father   . Anxiety disorder Father     Social History   Socioeconomic History  . Marital status: Single    Spouse name: Not on file  . Number of children: Not on file  . Years of education: Not on file  . Highest education level: Not on file  Occupational History  . Not on file  Tobacco Use  . Smoking status: Never Smoker  . Smokeless tobacco: Never Used  Vaping Use  . Vaping Use: Never used  Substance and Sexual Activity  . Alcohol use:  Yes    Comment: occasion  . Drug use: No  . Sexual activity: Never  Other Topics Concern  . Not on file  Social History Narrative   Regular exercise: none   Caffeine use: daily   Social Determinants of Health   Financial Resource Strain:   . Difficulty of Paying Living Expenses:   Food Insecurity:   . Worried About Programme researcher, broadcasting/film/videounning Out of Food in the Last Year:   . Baristaan Out of Food in the Last Year:   Transportation Needs:   . Freight forwarderLack of Transportation (Medical):   Marland Kitchen. Lack of Transportation (Non-Medical):   Physical Activity:   . Days of Exercise per Week:   . Minutes of Exercise per Session:   Stress:   . Feeling of Stress :   Social Connections:   . Frequency of Communication with Friends and Family:   . Frequency of Social Gatherings with Friends and Family:   . Attends Religious Services:   . Active Member of Clubs or Organizations:   . Attends BankerClub or Organization Meetings:   Marland Kitchen. Marital Status:   Intimate Partner Violence:   . Fear of Current or Ex-Partner:   . Emotionally Abused:   Marland Kitchen. Physically Abused:   . Sexually  Abused:     Past Medical History, Surgical history, Social history, and Family history were reviewed and updated as appropriate.   Please see review of systems for further details on the patient's review from today.   Objective:   Physical Exam:  BP 111/76   Pulse 81   Wt 145 lb (65.8 kg)   BMI 21.41 kg/m   Physical Exam Constitutional:      General: She is not in acute distress. Musculoskeletal:        General: No deformity.  Neurological:     Mental Status: She is alert and oriented to person, place, and time.     Coordination: Coordination normal.  Psychiatric:        Attention and Perception: Attention and perception normal. She does not perceive auditory or visual hallucinations.        Mood and Affect: Mood is anxious and depressed. Affect is not labile, blunt, angry or inappropriate.        Speech: Speech normal.        Behavior: Behavior normal.        Thought Content: Thought content normal. Thought content is not paranoid or delusional. Thought content does not include homicidal or suicidal ideation. Thought content does not include homicidal or suicidal plan.        Cognition and Memory: Cognition and memory normal.        Judgment: Judgment normal.     Comments: Insight intact     Lab Review:     Component Value Date/Time   NA 139 06/26/2017 0025   NA 138 08/31/2011 1624   K 3.7 06/26/2017 0025   K 4.1 08/31/2011 1624   CL 105 06/26/2017 0025   CL 102 08/31/2011 1624   CO2 25 06/26/2017 0025   CO2 27 08/31/2011 1624   GLUCOSE 110 (H) 06/26/2017 0025   GLUCOSE 81 08/31/2011 1624   BUN 9 06/26/2017 0025   BUN 10 08/31/2011 1624   CREATININE 0.88 06/26/2017 0025   CREATININE 0.88 08/31/2011 1624   CALCIUM 9.1 06/26/2017 0025   CALCIUM 9.1 08/31/2011 1624   PROT 7.4 06/26/2017 0025   PROT 7.8 08/31/2011 1624   ALBUMIN 4.5 06/26/2017 0025   ALBUMIN 4.0 08/31/2011 1624  AST 19 06/26/2017 0025   AST 25 08/31/2011 1624   ALT 14 06/26/2017 0025   ALT  24 08/31/2011 1624   ALKPHOS 49 06/26/2017 0025   ALKPHOS 52 08/31/2011 1624   BILITOT 0.5 06/26/2017 0025   BILITOT 0.3 08/31/2011 1624   GFRNONAA >60 06/26/2017 0025   GFRNONAA >60 08/31/2011 1624   GFRAA >60 06/26/2017 0025   GFRAA >60 08/31/2011 1624       Component Value Date/Time   WBC 6.4 06/26/2017 0025   RBC 3.85 06/26/2017 0025   HGB 12.1 06/26/2017 0025   HGB 12.7 08/31/2011 1624   HCT 34.8 (L) 06/26/2017 0025   HCT 37.7 08/31/2011 1624   PLT 246 06/26/2017 0025   PLT 248 08/31/2011 1624   MCV 90.3 06/26/2017 0025   MCV 90 08/31/2011 1624   MCH 31.5 06/26/2017 0025   MCHC 34.9 06/26/2017 0025   RDW 11.9 06/26/2017 0025   RDW 12.6 08/31/2011 1624   LYMPHSABS 2.2 04/07/2017 1145   MONOABS 0.4 04/07/2017 1145   EOSABS 0.1 04/07/2017 1145   BASOSABS 0.0 04/07/2017 1145    No results found for: POCLITH, LITHIUM   No results found for: PHENYTOIN, PHENOBARB, VALPROATE, CBMZ   .res Assessment: Plan:   Patient seen for 45 minutes and time spent reviewing recent GI signs and symptoms and visit with GI specialist to address possible GI side effects and response to sertraline.  Time also spent reviewing results of pharmacogenetics testing and discussing implications for treatment.  Discussed that she is homozygous for MTHFR mutation and therefore recommend starting Deplin 15 mg daily for augmentation of depression. Discussed continuing to decrease and discontinue sertraline due to worsening GI signs and symptoms since starting sertraline and sertraline having limited benefit for patient's mood or anxiety.  Will decrease sertraline to 25 mg daily for 7 to 10 days, then stop. Discussed decreasing Trileptal and considering future discontinuation due to limited efficacy and in an attempt to reduce any unnecessary medications.  Will decrease Trileptal to 300 mg at bedtime. Discussed long-range goal of discontinuing Librium and continuing Klonopin to avoid patient taking multiple  benzodiazepines. Continue Latuda 80 mg daily with supper for mood s/s. Discussed considering Rexulti or Saphris in the future based upon results of pharmacogenetics testing, family history, and patient's responses to similar medications. Recommend continuing psychotherapy with Stevphen Meuse, LC [facility]. Patient to follow-up with this provider in 4 weeks or sooner if clinically indicated. Patient advised to contact office with any questions, adverse effects, or acute worsening in signs and symptoms.  Peggy Ruiz was seen today for anxiety, depression and insomnia.  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  PTSD (post-traumatic stress disorder) -     sertraline (ZOLOFT) 100 MG tablet; Take 25 mg daily for 7-10 days, then stop  Mild mood disorder (HCC) -     sertraline (ZOLOFT) 100 MG tablet; Take 25 mg daily for 7-10 days, then stop -     L-Methylfolate-Algae (DEPLIN 15) 15-90.314 MG CAPS; Take 15 mg by mouth daily. -     oxcarbazepine (TRILEPTAL) 600 MG tablet; Take 1/2 tab po QHS  Generalized anxiety disorder -     sertraline (ZOLOFT) 100 MG tablet; Take 25 mg daily for 7-10 days, then stop     Please see After Visit Summary for patient specific instructions.  Future Appointments  Date Time  Provider Department Center  01/28/2020  2:00 PM Stevphen Meuse, [facility] CP-CP None  02/04/2020  2:00 PM Stevphen Meuse, [facility] CP-CP None  02/18/2020  2:00 PM Stevphen Meuse, [facility] CP-CP None  02/21/2020 11:30 AM Corie Chiquito, [facility] CP-CP None  03/03/2020  2:00 PM Stevphen Meuse, [facility] CP-CP None  03/17/2020  2:00 PM Stevphen Meuse, [facility] CP-CP None    No orders of the defined types were placed in this encounter.   -------------------------------

## 2020-01-24 ENCOUNTER — Encounter: Payer: Self-pay | Admitting: Psychiatry

## 2020-01-28 ENCOUNTER — Other Ambulatory Visit: Payer: Self-pay

## 2020-01-28 ENCOUNTER — Ambulatory Visit (INDEPENDENT_AMBULATORY_CARE_PROVIDER_SITE_OTHER): Payer: PRIVATE HEALTH INSURANCE | Admitting: Psychiatry

## 2020-01-28 DIAGNOSIS — F4310 Post-traumatic stress disorder, unspecified: Secondary | ICD-10-CM
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
## 2020-02-04 ENCOUNTER — Other Ambulatory Visit: Payer: Self-pay

## 2020-02-04 ENCOUNTER — Ambulatory Visit (INDEPENDENT_AMBULATORY_CARE_PROVIDER_SITE_OTHER): Payer: PRIVATE HEALTH INSURANCE | Admitting: Psychiatry

## 2020-02-04 DIAGNOSIS — F4310 Post-traumatic stress disorder, unspecified: Secondary | ICD-10-CM

## 2020-02-04 NOTE — Progress Notes (Incomplete)
      [facility] Counselor/Therapist Progress Note  Patient ID: RAYLEN KEN, MRN: [id number],    Date: 02/04/2020  Time Spent: ***start time 2:05 PM   Treatment Type: Individual Therapy  Reported Symptoms: anger, sadness, anxiety, triggered responses  Mental Status Exam:  Appearance:   Well Groomed     Behavior:  Appropriate  Motor:  Normal  Speech/Language:   Normal Rate  Affect:  Appropriate  Mood:  angry and anxious  Thought process:  normal  Thought content:    WNL  Sensory/Perceptual disturbances:    WNL  Orientation:  oriented to person, place, time/date and situation  Attention:  Good  Concentration:  Good  Memory:  WNL  Fund of knowledge:   Good  Insight:    Good  Judgment:   Good  Impulse Control:  Good   Risk Assessment: Danger to Self:  No Self-injurious Behavior: No Danger to Others: No Duty to Warn:no Physical Aggression / Violence:No  Access to Firearms a concern: No  Gang Involvement:No   Subjective: Patient was present for session.  She reported that she was very upset.  She explained that they had decided to invite her brother for Cody's dinner and it did not go well.    Interventions: {PSY:[id number]}  Diagnosis:   ICD-10-CM   1. Posttraumatic stress disorder  F43.10     Plan: ***  Stevphen Meuse, [facility]

## 2020-02-18 ENCOUNTER — Telehealth: Payer: Self-pay | Admitting: Psychiatry

## 2020-02-18 ENCOUNTER — Ambulatory Visit (INDEPENDENT_AMBULATORY_CARE_PROVIDER_SITE_OTHER): Payer: PRIVATE HEALTH INSURANCE | Admitting: Psychiatry

## 2020-02-18 DIAGNOSIS — F4310 Post-traumatic stress disorder, unspecified: Secondary | ICD-10-CM

## 2020-02-18 NOTE — Telephone Encounter (Signed)
[name], mohiuddin are scheduled for a virtual visit with your provider today.    Just as we do with appointments in the office, we must obtain your consent to participate.  Your consent will be active for this visit and any virtual visit you may have with one of our providers in the next 365 days.    If you have a MyChart account, I can also send a copy of this consent to you electronically.  All virtual visits are billed to your insurance company just like a traditional visit in the office.  As this is a virtual visit, video technology does not allow for your provider to perform a traditional examination.  This may limit your provider's ability to fully assess your condition.  If your provider identifies any concerns that need to be evaluated in person or the need to arrange testing such as labs, EKG, etc, we will make arrangements to do so.    Although advances in technology are sophisticated, we cannot ensure that it will always work on either your end or our end.  If the connection with a video visit is poor, we may have to switch to a telephone visit.  With either a video or telephone visit, we are not always able to ensure that we have a secure connection.   I need to obtain your verbal consent now.   Are you willing to proceed with your visit today?   [name] has provided verbal consent on 02/18/2020 for a virtual visit (video or telephone).   Stevphen Meuse, [facility] 02/18/2020  2:04 PM

## 2020-02-18 NOTE — Progress Notes (Signed)
[facility] Counselor/Therapist Progress Note  Patient ID: Peggy Ruiz, MRN: [id number],    Date: 02/18/2020  Time Spent: 52 minutes start time 2:03 PM end time 2:55 PM Virtual Visit via Telephone Note Connected with patient by a video enabled telemedicine/telehealth application or telephone, with their informed consent, and verified patient privacy and that I am speaking with the correct person using two identifiers. I discussed the limitations, risks, security and privacy concerns of performing psychotherapy and management service by telephone and the availability of in person appointments. I also discussed with the patient that there may be a patient responsible charge related to this service. The patient expressed understanding and agreed to proceed. I discussed the treatment planning with the patient. The patient was provided an opportunity to ask questions and all were answered. The patient agreed with the plan and demonstrated an understanding of the instructions. The patient was advised to call  our office if  symptoms worsen or feel they are in a crisis state and need immediate contact.   Therapist Location: office Patient Location: home    Treatment Type: Individual Therapy  Reported Symptoms: anxiety, panic, triggered responses, health issues, sadness, irritability  Mental Status Exam:  Appearance:   NA     Behavior:  Sharing  Motor:  na  Speech/Language:   Normal Rate  Affect:  NA  Mood:  anxious  Thought process:  normal  Thought content:    WNL  Sensory/Perceptual disturbances:    WNL  Orientation:  oriented to person, place, time/date and situation  Attention:  Good  Concentration:  Good  Memory:  WNL  Fund of knowledge:   Good  Insight:    Good  Judgment:   Good  Impulse Control:  Good   Risk Assessment: Danger to Self:  No Self-injurious Behavior: No Danger to Others: No Duty to Warn:no Physical Aggression / Violence:No  Access to Firearms a  concern: No  Gang Involvement:No   Subjective: Met with patient via phone, she was unable to come in for a session due to being sick.  She shared she was hopeful that she could have a session but she was not sure due to her condition. She shared she is feeling lots of pressure because they are in the process of buying a house.   She shared she is excited because it is a peaceful place. Patient shared she is having anxiety because all of their savings account will be gone.  Encouraged patient to focus on how easily the whole thing worked out rather than the "what ifs".  Asked patient to write down all the things that have worked out for her in ways that she could not have done on her own. Patient shared she and her husband are having issues due to not wanting to have to get the vaccination and feeling that they will be forced to at some point.  She shared that is triggering because it makes her feel she does not have control again. She explained she is having anger surfacing out of proportion to the situation. Discussed the situation and patient was able to realize that the emotions may be tied to feeling powerless.  She acknowledged that originally anxiety was the predominant emotion but now it seems like anger it the issue. Patient was encouraged to find ways to release her emotions in positive ways so they won't come out in negative manners.  She agreed to try to walk and consider blogging to release emotions.  Discussed the importance of starting with 5 minute walks and than increasing the time gradually.  Interventions: Cognitive Behavioral Therapy and Solution-Oriented/Positive Psychology  Diagnosis:   ICD-10-CM   1. Posttraumatic stress disorder  F43.10     Plan: Patient is to use CBT and coping skills to decrease triggered responses.  Patient is to work on writing out the things that work out in positive ways that she could not imagine. Patient is to start walking and consider blogging to help  manage emotions.  Long term goal: Interact normally with friends and family without irrational fears or intrusive thoughts that control behavior. Display a full range of emotions  Without experiencing loss of control Short term goal: Identify and replace negative self-talk and catastrophizing that is associated with past trauma and current stimulus triggers for anxiety  Lina Sayre, [facility]

## 2020-02-21 ENCOUNTER — Ambulatory Visit (INDEPENDENT_AMBULATORY_CARE_PROVIDER_SITE_OTHER): Payer: PRIVATE HEALTH INSURANCE | Admitting: Psychiatry

## 2020-02-21 ENCOUNTER — Other Ambulatory Visit: Payer: Self-pay

## 2020-02-21 ENCOUNTER — Telehealth: Payer: Self-pay | Admitting: Psychiatry

## 2020-02-21 ENCOUNTER — Encounter: Payer: Self-pay | Admitting: Psychiatry

## 2020-02-21 DIAGNOSIS — F411 Generalized anxiety disorder: Secondary | ICD-10-CM

## 2020-02-21 DIAGNOSIS — F4310 Post-traumatic stress disorder, unspecified: Secondary | ICD-10-CM

## 2020-02-21 DIAGNOSIS — F39 Unspecified mood [affective] disorder: Secondary | ICD-10-CM | POA: Diagnosis not present

## 2020-02-21 MED ORDER — NORTRIPTYLINE HCL 10 MG PO CAPS
10.0000 mg | ORAL_CAPSULE | Freq: Every day | ORAL | 0 refills | Status: AC
Start: 2020-02-21 — End: ?

## 2020-02-21 MED ORDER — LURASIDONE HCL 80 MG PO TABS: 80.0000 mg | ORAL_TABLET | Freq: Every day | ORAL | 2 refills | Status: DC

## 2020-02-21 MED ORDER — CLONIDINE HCL 0.1 MG PO TABS: ORAL_TABLET | ORAL | 0 refills | Status: DC

## 2020-02-21 MED ORDER — CLONAZEPAM 0.5 MG PO TABS: ORAL_TABLET | ORAL | 1 refills | Status: DC

## 2020-02-21 MED ORDER — OXCARBAZEPINE 150 MG PO TABS: ORAL_TABLET | ORAL | 1 refills | Status: DC

## 2020-02-21 NOTE — Progress Notes (Signed)
Peggy Ruiz [id number] 09/18/1985 34 y.o.  Subjective:   Patient ID:  Peggy Ruiz is a 34 y.o. (DOB 1985-08-29) female.  Chief Complaint:  Chief Complaint  Patient presents with  . Anxiety  . Depression    HPI Peggy Ruiz presents to the office today for follow-up of mood, anxiety, and insomnia. She reports that her endoscopy showed some inflammation and redness and indicates bile reflux gastritis. Has started Famotidine and was recommended to start oatmeal before bedtime and increase magnesium. Has scheduled test to evaluate for SIBO.   She reports that her diarrhea resolved with stopping diarrhea. She reports that she has been able to eat more since stopping Sertraline. Acid reflux has improved but persists. She decreased Trileptal to 1/2 tab and was able to fall asleep easier at night and was more tired during the day. She reports that the first week of the lower dose of Trileptal she had increased neuropathic pain and then this resolved. She reports that she has been sleeping better.   Reports that she stopped Deplin after not noticing any improvement after about 3 weeks.   She reports that her anxiety has been "up and down." She has had some anxiety with process of buying first house and feels she is handling it ok. Has had 2-3 panic attacks and some crying spells and feels she is handling it ok. Reports that she feels more hopeful with planned move. Reports slight improvement in mood. She reports that energy varies. Motivation has been increased. Concentration has been adequate. Denies SI.   Buying a house.   Past Psychiatric Medication Trials: Cymbalta Prozac Lexapro Paxil- Initially helped and then did not seem to be as effective when used without Zyprexa Effexor Nortriptyline-prescribed by GI for gastroparesis Remeron-caused insomnia Buspar- Adverse reaction Abilify-involuntary movements Zyprexa-effective for a period of time. Caused excessive weight gain at  times. Seroquel Latuda-effective. Sleeping "harder" on 80 mg. Lamictal Gabapentin- Cannot tolerate doses higher than 800 mg BID Trileptal- Reports that she was unable to tolerate 900 mg QHS Belsomra-helpful with sleep quality but not sleep quantity. Klonopin Ativan Librium Clonidine- Effective Propranolol Trazodone Ambien Nuvigil Adderall Deplin  She reports that Paxil, Zyprexa, and Klonopin were most effective combination for her s/s.   AIMS     Office Visit from 02/21/2020 in [facility] Office Visit from 02/25/2019 in [facility]  AIMS Total Score 0 0       Review of Systems:  Review of Systems  Gastrointestinal: Positive for abdominal distention, abdominal pain and nausea. Negative for diarrhea and vomiting.  Musculoskeletal: Negative for gait problem.  Neurological: Positive for dizziness. Negative for tremors.  Psychiatric/Behavioral:       Please refer to HPI    Medications: I have reviewed the patient's current medications.  Current Outpatient Medications  Medication Sig Dispense Refill  . cloNIDine (CATAPRES) 0.1 MG tablet Take 1 tab po QHS and 1 qd prn 180 tablet 0  . dexlansoprazole (DEXILANT) 60 MG capsule Take 60 mg by mouth every morning.    . famotidine (PEPCID) 20 MG tablet Take 20 mg by mouth daily.    Marland Kitchen gabapentin (NEURONTIN) 800 MG tablet Take 800 mg by mouth 2 (two) times daily.    Marland Kitchen levothyroxine (SYNTHROID, LEVOTHROID) 50 MCG tablet Take 50 mcg by mouth daily.    . magnesium 30 MG tablet Take 30 mg by mouth 2 (two) times daily.    . methocarbamol (ROBAXIN) 750 MG tablet Take 750 mg by  mouth every 8 (eight) hours as needed.     . Multiple Vitamin (MULTIVITAMIN) tablet Take 1 tablet by mouth daily.    . nortriptyline (PAMELOR) 10 MG capsule Take 1 capsule (10 mg total) by mouth at bedtime. 90 capsule 0  . oxcarbazepine (TRILEPTAL) 150 MG tablet Take 1 tab po QHS for 1-2 weeks, then 1/2 tab po QHS for 1-2 weeks, then  stop 30 tablet 1  . Probiotic Product (PROBIOTIC DAILY PO) Take by mouth.    . clonazePAM (KLONOPIN) 0.5 MG tablet Take 1 tablet (0.5 mg total) by mouth 2 (two) times daily AND 2 tablets (1 mg total) at bedtime. 120 tablet 1  . levonorgestrel-ethinyl estradiol (SEASONALE,INTROVALE,JOLESSA) 0.15-0.03 MG tablet Take 1 tablet by mouth daily.    Marland Kitchen Lifitegrast (XIIDRA) 5 % SOLN Apply to eye.    . lurasidone (LATUDA) 80 MG TABS tablet Take 1 tablet (80 mg total) by mouth daily with supper. 30 tablet 2  . ondansetron (ZOFRAN-ODT) 8 MG disintegrating tablet Take 8 mg by mouth every 8 (eight) hours as needed for nausea.     No current facility-administered medications for this visit.    Medication Side Effects: None  Allergies:  Allergies  Allergen Reactions  . Promethazine     Other reaction(s): Agitation  . Ciprofloxacin Other (See Comments)    c-diff  . Dilaudid [Hydromorphone]     sob  . Epinephrine   . Penicillins Hives    Has patient had a PCN reaction causing immediate rash, facial/tongue/throat swelling, SOB or lightheadedness with hypotension: Yes Has patient had a PCN reaction causing severe rash involving mucus membranes or skin necrosis: No Has patient had a PCN reaction that required hospitalization: No Has patient had a PCN reaction occurring within the last 10 years: Yes If all of the above answers are "NO", then may proceed with Cephalosporin use.   . Toradol [Ketorolac Tromethamine] Nausea And Vomiting    Past Medical History:  Diagnosis Date  . Anorexia    at 15  . Anxiety   . Attention deficit disorder (ADD)   . Bone spur   . Depression   . Eczema   . HPV (human papilloma virus) infection     Family History  Problem Relation Age of Onset  . Fibromyalgia Mother   . Anxiety disorder Mother   . Depression Mother   . Depression Father   . Anxiety disorder Father     Social History   Socioeconomic History  . Marital status: Single    Spouse name: Not on  file  . Number of children: Not on file  . Years of education: Not on file  . Highest education level: Not on file  Occupational History  . Not on file  Tobacco Use  . Smoking status: Never Smoker  . Smokeless tobacco: Never Used  Vaping Use  . Vaping Use: Never used  Substance and Sexual Activity  . Alcohol use: Yes    Comment: occasion  . Drug use: No  . Sexual activity: Never  Other Topics Concern  . Not on file  Social History Narrative   Regular exercise: none   Caffeine use: daily   Social Determinants of Health   Financial Resource Strain:   . Difficulty of Paying Living Expenses: Not on file  Food Insecurity:   . Worried About Programme researcher, broadcasting/film/video in the Last Year: Not on file  . Ran Out of Food in the Last Year: Not on file  Transportation  Needs:   . Lack of Transportation (Medical): Not on file  . Lack of Transportation (Non-Medical): Not on file  Physical Activity:   . Days of Exercise per Week: Not on file  . Minutes of Exercise per Session: Not on file  Stress:   . Feeling of Stress : Not on file  Social Connections:   . Frequency of Communication with Friends and Family: Not on file  . Frequency of Social Gatherings with Friends and Family: Not on file  . Attends Religious Services: Not on file  . Active Member of Clubs or Organizations: Not on file  . Attends Banker Meetings: Not on file  . Marital Status: Not on file  Intimate Partner Violence:   . Fear of Current or Ex-Partner: Not on file  . Emotionally Abused: Not on file  . Physically Abused: Not on file  . Sexually Abused: Not on file    Past Medical History, Surgical history, Social history, and Family history were reviewed and updated as appropriate.   Please see review of systems for further details on the patient's review from today.   Objective:   Physical Exam:  BP 103/76   Pulse 85   Wt 144 lb (65.3 kg)   BMI 21.27 kg/m   Physical Exam Constitutional:       General: She is not in acute distress. Musculoskeletal:        General: No deformity.  Neurological:     Mental Status: She is alert and oriented to person, place, and time.     Coordination: Coordination normal.  Psychiatric:        Attention and Perception: Attention and perception normal. She does not perceive auditory or visual hallucinations.        Mood and Affect: Mood is anxious. Mood is not depressed. Affect is not labile, blunt, angry or inappropriate.        Speech: Speech normal.        Behavior: Behavior normal.        Thought Content: Thought content normal. Thought content is not paranoid or delusional. Thought content does not include homicidal or suicidal ideation. Thought content does not include homicidal or suicidal plan.        Cognition and Memory: Cognition and memory normal.        Judgment: Judgment normal.     Comments: Insight intact     Lab Review:     Component Value Date/Time   NA 139 06/26/2017 0025   NA 138 08/31/2011 1624   K 3.7 06/26/2017 0025   K 4.1 08/31/2011 1624   CL 105 06/26/2017 0025   CL 102 08/31/2011 1624   CO2 25 06/26/2017 0025   CO2 27 08/31/2011 1624   GLUCOSE 110 (H) 06/26/2017 0025   GLUCOSE 81 08/31/2011 1624   BUN 9 06/26/2017 0025   BUN 10 08/31/2011 1624   CREATININE 0.88 06/26/2017 0025   CREATININE 0.88 08/31/2011 1624   CALCIUM 9.1 06/26/2017 0025   CALCIUM 9.1 08/31/2011 1624   PROT 7.4 06/26/2017 0025   PROT 7.8 08/31/2011 1624   ALBUMIN 4.5 06/26/2017 0025   ALBUMIN 4.0 08/31/2011 1624   AST 19 06/26/2017 0025   AST 25 08/31/2011 1624   ALT 14 06/26/2017 0025   ALT 24 08/31/2011 1624   ALKPHOS 49 06/26/2017 0025   ALKPHOS 52 08/31/2011 1624   BILITOT 0.5 06/26/2017 0025   BILITOT 0.3 08/31/2011 1624   GFRNONAA >60 06/26/2017 0025   GFRNONAA >  60 08/31/2011 1624   GFRAA >60 06/26/2017 0025   GFRAA >60 08/31/2011 1624       Component Value Date/Time   WBC 6.4 06/26/2017 0025   RBC 3.85 06/26/2017 0025    HGB 12.1 06/26/2017 0025   HGB 12.7 08/31/2011 1624   HCT 34.8 (L) 06/26/2017 0025   HCT 37.7 08/31/2011 1624   PLT 246 06/26/2017 0025   PLT 248 08/31/2011 1624   MCV 90.3 06/26/2017 0025   MCV 90 08/31/2011 1624   MCH 31.5 06/26/2017 0025   MCHC 34.9 06/26/2017 0025   RDW 11.9 06/26/2017 0025   RDW 12.6 08/31/2011 1624   LYMPHSABS 2.2 04/07/2017 1145   MONOABS 0.4 04/07/2017 1145   EOSABS 0.1 04/07/2017 1145   BASOSABS 0.0 04/07/2017 1145    No results found for: POCLITH, LITHIUM   No results found for: PHENYTOIN, PHENOBARB, VALPROATE, CBMZ   .res Assessment: Plan:   Discussed decreasing Librium since Librium is becoming available and to minimize use of multiple benzodiazepines.  Contacted pharmacy during patient visit and learned that it may be at least 2 weeks before pharmacy is able to obtain Librium.  Discussed options with patient, to include stopping the Librium and increasing amount of Klonopin.  Patient is in agreement with stopping Librium and increasing amount of Klonopin.  Will increase Klonopin to Klonopin 0.5 mg po q am, 0.5 mg mid-day, and 1 mg QHS.  Discussed taking full amount of Klonopin for at least 1 week even if drowsiness occurs to minimize risk of benzodiazepine withdrawal.  Patient advised to contact office if she does experience withdrawal signs and symptoms. Also discussed continuing to reduce amount of Trileptal after she has discontinued Librium since Trileptal may help prevent potential benzodiazepine withdrawal signs and symptoms.  Discussed that she could then decrease Trileptal to 150 mg for 1 to 2 weeks, then 75 mg at bedtime for 1 to 2 weeks, and then discontinue. Continue all other medications as prescribed. Discussed talking with her GI specialist about him assuming management of nortriptyline again since it is now being prescribed for GI indications.  Will refill nortriptyline today to avoid any Will refill nortriptyline today to avoid any lapse in  treatment. Recommend continuing psychotherapy with Stevphen MeuseHolly Ingram, [facility]. Patient advised to contact office with any questions, adverse effects, or acute worsening in signs and symptoms.   Tacey RuizLeah was seen today for anxiety and depression.  Diagnoses and all orders for this visit:  PTSD (post-traumatic stress disorder) -     Discontinue: clonazePAM (KLONOPIN) 0.5 MG tablet; Take 1 tablet (0.5 mg total) by mouth 2 (two) times daily AND 2 tablets (1 mg total) at bedtime. TAKE 1/2 TO 1 TABLET BY MOUTH IN THE MORNING AND TAKE 1 TABLET BY MOUTH AT BEDTIME. -     cloNIDine (CATAPRES) 0.1 MG tablet; Take 1 tab po QHS and 1 qd prn -     nortriptyline (PAMELOR) 10 MG capsule; Take 1 capsule (10 mg total) by mouth at bedtime.  Mild mood disorder (HCC) -     oxcarbazepine (TRILEPTAL) 150 MG tablet; Take 1 tab po QHS for 1-2 weeks, then 1/2 tab po QHS for 1-2 weeks, then stop -     lurasidone (LATUDA) 80 MG TABS tablet; Take 1 tablet (80 mg total) by mouth daily with supper. -     nortriptyline (PAMELOR) 10 MG capsule; Take 1 capsule (10 mg total) by mouth at bedtime.  Generalized anxiety disorder -     cloNIDine (  CATAPRES) 0.1 MG tablet; Take 1 tab po QHS and 1 qd prn     Please see After Visit Summary for patient specific instructions.  Future Appointments  Date Time Provider Department Center  03/03/2020  2:00 PM Stevphen Meuse, [facility] None  03/17/2020  2:00 PM Stevphen Meuse, [facility] None  03/24/2020 12:45 PM Corie Chiquito, [facility] None  03/31/2020  2:00 PM Stevphen Meuse, [facility] None  04/14/2020  2:00 PM Stevphen Meuse, [facility] None    No orders of the defined types were placed in this encounter.   -------------------------------

## 2020-02-21 NOTE — Telephone Encounter (Signed)
[facility] called to clarify rx for Clonazepam. Please call [phone].

## 2020-02-21 NOTE — Telephone Encounter (Signed)
Called pharmacy to clarify instructions but they won't take a verbal. Require a new Rx to be sent.

## 2020-02-21 NOTE — Progress Notes (Signed)
   02/21/20 1205  Facial and Oral Movements  Muscles of Facial Expression 0  Lips and Perioral Area 0  Jaw 0  Tongue 0  Extremity Movements  Upper (arms, wrists, hands, fingers) 0  Lower (legs, knees, ankles, toes) 0  Trunk Movements  Neck, shoulders, hips 0  Overall Severity  Severity of abnormal movements (highest score from questions above) 0  Incapacitation due to abnormal movements 0  Patient's awareness of abnormal movements (rate only patient's report) 0  AIMS Total Score  AIMS Total Score 0

## 2020-02-25 ENCOUNTER — Ambulatory Visit: Payer: PRIVATE HEALTH INSURANCE | Admitting: Psychiatry

## 2020-02-25 ENCOUNTER — Telehealth: Payer: Self-pay

## 2020-02-25 ENCOUNTER — Other Ambulatory Visit: Payer: Self-pay

## 2020-02-25 DIAGNOSIS — F4310 Post-traumatic stress disorder, unspecified: Secondary | ICD-10-CM

## 2020-02-25 MED ORDER — CLONAZEPAM 1 MG PO TABS: ORAL_TABLET | ORAL | 1 refills | Status: DC

## 2020-02-26 NOTE — Telephone Encounter (Signed)
Prior authorization submitted for clonazepam 0.5 mg tablets 4/day #120 with [facility]. Quantity limit is 3/day therefore PA was denied. Jessica notified and dose changed to Clonazepam 1 mg tablet take 0.5 tablet bid and 1 tablet at hs. #90. Insurance accepted that quantity and strength without another prior authorization.   Contacted pharmacy and they were able to run it without any issues.  Patient also contacted and appreciative of the work to get it done.

## 2020-03-03 ENCOUNTER — Other Ambulatory Visit: Payer: Self-pay

## 2020-03-03 ENCOUNTER — Ambulatory Visit (INDEPENDENT_AMBULATORY_CARE_PROVIDER_SITE_OTHER): Payer: PRIVATE HEALTH INSURANCE | Admitting: Psychiatry

## 2020-03-03 DIAGNOSIS — F4310 Post-traumatic stress disorder, unspecified: Secondary | ICD-10-CM
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
## 2020-03-17 ENCOUNTER — Ambulatory Visit (INDEPENDENT_AMBULATORY_CARE_PROVIDER_SITE_OTHER): Payer: PRIVATE HEALTH INSURANCE | Admitting: Psychiatry

## 2020-03-17 ENCOUNTER — Other Ambulatory Visit: Payer: Self-pay

## 2020-03-17 DIAGNOSIS — F4310 Post-traumatic stress disorder, unspecified: Secondary | ICD-10-CM | POA: Diagnosis not present
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
## 2020-03-24 ENCOUNTER — Ambulatory Visit (INDEPENDENT_AMBULATORY_CARE_PROVIDER_SITE_OTHER): Payer: PRIVATE HEALTH INSURANCE | Admitting: Psychiatry

## 2020-03-24 ENCOUNTER — Other Ambulatory Visit: Payer: Self-pay

## 2020-03-24 ENCOUNTER — Encounter: Payer: Self-pay | Admitting: Psychiatry

## 2020-03-24 DIAGNOSIS — F411 Generalized anxiety disorder: Secondary | ICD-10-CM | POA: Diagnosis not present

## 2020-03-24 DIAGNOSIS — F4310 Post-traumatic stress disorder, unspecified: Secondary | ICD-10-CM

## 2020-03-24 MED ORDER — CLONIDINE HCL 0.1 MG PO TABS: ORAL_TABLET | ORAL | 0 refills | Status: DC

## 2020-03-24 MED ORDER — CLONAZEPAM 1 MG PO TABS: 1.0000 mg | ORAL_TABLET | Freq: Every day | ORAL | 1 refills | Status: DC

## 2020-03-24 MED ORDER — CLONAZEPAM 0.5 MG PO TABS: ORAL_TABLET | ORAL | 1 refills | Status: DC

## 2020-03-24 NOTE — Progress Notes (Signed)
Peggy Ruiz [id number] Jul 01, 1985 34 y.o.  Subjective:   Patient ID:  Peggy Ruiz is a 34 y.o. (DOB 30-Nov-1985) female.  Chief Complaint:  Chief Complaint  Patient presents with  . Follow-up    anxiety, mood disturbance, and insomnia    HPI Peggy Ruiz presents to the office today for follow-up of mood, anxiety, and insomnia. She reports that "it has been a roller coaster ride" with coming off Trileptal and Librium. She reports that Klonopin 1 mg tabs are not scored and this has complicated taper. She reports that in the late afternoon/early evening she experiences increased anxiety/withdrawal.   She reports that she then started decreased titration of Trileptal. She has not taken it the last 3 nights. Denies any worsening in anxiety or mood with stopping Trileptal. She reports that she has "some really good days" with anxiety- "my good days are now better than they were before." She questions if some of this may be related to new supplements. She reports that she continues to have "not so good days." She reports that anxiety seems to be in response to identifiable triggers. She reports that she has had one panic attack in the last month and that there were multiple triggers that day. Mood has been good most days. She reports that she is not feeling as tired and this has had a positive impact on her mood and motivation. No longer "hitting the wall" in the mid afternoon except for about once a week. She reports that her concentration is improved and was able to complete continuing education and answer test questions. Was also able to renew CPR certification. Sleeping an adequate amount. Reports that she is typically awakening once to go to the bathroom and then is able to return to sleep. Appetite is unchanged. Wt has been stable. Eating very small meals. Early satiety. Denies SI.   She reports, "I still feel depressed." She reports periods of tearfulness.   No longer buying a house and  this fell through after house appraised for much less than asking price. Husband is interviewing for a new position in [employer]. They plan to rent month to month or a ST lease.   Past Psychiatric Medication Trials: Cymbalta Prozac Lexapro Paxil- Initially helped and then did not seem to be as effective when used without Zyprexa Effexor Nortriptyline-prescribed by GI for gastroparesis Remeron-caused insomnia Buspar- Adverse reaction Abilify-involuntary movements Zyprexa-effective for a period of time. Caused excessive weight gain at times. Seroquel Latuda-effective. Sleeping "harder" on 80 mg. Lamictal Gabapentin- Cannot tolerate doses higher than 800 mg BID Trileptal- Reports that she was unable to tolerate 900 mg QHS Belsomra-helpful with sleep quality but not sleep quantity. Klonopin Ativan Librium Clonidine- Effective Propranolol Trazodone Ambien Nuvigil Adderall Deplin   AIMS     Office Visit from 02/21/2020 in [facility] Office Visit from 02/25/2019 in [facility]  AIMS Total Score 0 0       Review of Systems:  Review of Systems  Gastrointestinal:       Results were negative for SIBO.   Musculoskeletal: Positive for back pain. Negative for gait problem.  Neurological: Negative for tremors.       Nerve pain- "pins and needles" on the the bottom of her feet  Psychiatric/Behavioral:       Please refer to HPI   She reports that she seems to have increased neuropathic pain.   Medications: I have reviewed the patient's current medications.  Current Outpatient Medications  Medication Sig Dispense Refill  . cloNIDine (CATAPRES) 0.1 MG tablet Take 1 tab po QHS and 1 qd prn 180 tablet 0  . dexlansoprazole (DEXILANT) 60 MG capsule Take 60 mg by mouth every morning.    . famotidine (PEPCID) 20 MG tablet Take 20 mg by mouth daily.    Marland Kitchen. gabapentin (NEURONTIN) 800 MG tablet Take 800 mg by mouth 2 (two) times daily.    Marland Kitchen.  levothyroxine (SYNTHROID, LEVOTHROID) 50 MCG tablet Take 50 mcg by mouth daily.    Marland Kitchen. Lifitegrast (XIIDRA) 5 % SOLN Apply to eye.    . magnesium 30 MG tablet Take 30 mg by mouth 2 (two) times daily.    . methocarbamol (ROBAXIN) 750 MG tablet Take 750 mg by mouth every 8 (eight) hours as needed.     . Multiple Vitamin (MULTIVITAMIN) tablet Take 1 tablet by mouth daily.    . nortriptyline (PAMELOR) 10 MG capsule Take 1 capsule (10 mg total) by mouth at bedtime. 90 capsule 0  . ondansetron (ZOFRAN-ODT) 8 MG disintegrating tablet Take 8 mg by mouth every 8 (eight) hours as needed for nausea.    . Probiotic Product (PROBIOTIC DAILY PO) Take by mouth.    . clonazePAM (KLONOPIN) 0.5 MG tablet Take 1 tablet in the morning and 1 tablet mid-day. 60 tablet 1  . clonazePAM (KLONOPIN) 1 MG tablet Take 1 tablet (1 mg total) by mouth at bedtime. 30 tablet 1  . levonorgestrel-ethinyl estradiol (SEASONALE,INTROVALE,JOLESSA) 0.15-0.03 MG tablet Take 1 tablet by mouth daily.    Marland Kitchen. lurasidone (LATUDA) 80 MG TABS tablet Take 1 tablet (80 mg total) by mouth daily with supper. 30 tablet 2   No current facility-administered medications for this visit.    Medication Side Effects: None  Allergies:  Allergies  Allergen Reactions  . Promethazine     Other reaction(s): Agitation  . Ciprofloxacin Other (See Comments)    c-diff  . Dilaudid [Hydromorphone]     sob  . Epinephrine   . Penicillins Hives    Has patient had a PCN reaction causing immediate rash, facial/tongue/throat swelling, SOB or lightheadedness with hypotension: Yes Has patient had a PCN reaction causing severe rash involving mucus membranes or skin necrosis: No Has patient had a PCN reaction that required hospitalization: No Has patient had a PCN reaction occurring within the last 10 years: Yes If all of the above answers are "NO", then may proceed with Cephalosporin use.   . Toradol [Ketorolac Tromethamine] Nausea And Vomiting    Past Medical  History:  Diagnosis Date  . Anorexia    at 15  . Anxiety   . Attention deficit disorder (ADD)   . Bone spur   . Depression   . Eczema   . HPV (human papilloma virus) infection     Family History  Problem Relation Age of Onset  . Fibromyalgia Mother   . Anxiety disorder Mother   . Depression Mother   . Depression Father   . Anxiety disorder Father     Social History   Socioeconomic History  . Marital status: Single    Spouse name: Not on file  . Number of children: Not on file  . Years of education: Not on file  . Highest education level: Not on file  Occupational History  . Not on file  Tobacco Use  . Smoking status: Never Smoker  . Smokeless tobacco: Never Used  Vaping Use  . Vaping Use: Never used  Substance and Sexual Activity  .  Alcohol use: Yes    Comment: occasion  . Drug use: No  . Sexual activity: Never  Other Topics Concern  . Not on file  Social History Narrative   Regular exercise: none   Caffeine use: daily   Social Determinants of Health   Financial Resource Strain:   . Difficulty of Paying Living Expenses: Not on file  Food Insecurity:   . Worried About Programme researcher, broadcasting/film/video in the Last Year: Not on file  . Ran Out of Food in the Last Year: Not on file  Transportation Needs:   . Lack of Transportation (Medical): Not on file  . Lack of Transportation (Non-Medical): Not on file  Physical Activity:   . Days of Exercise per Week: Not on file  . Minutes of Exercise per Session: Not on file  Stress:   . Feeling of Stress : Not on file  Social Connections:   . Frequency of Communication with Friends and Family: Not on file  . Frequency of Social Gatherings with Friends and Family: Not on file  . Attends Religious Services: Not on file  . Active Member of Clubs or Organizations: Not on file  . Attends Banker Meetings: Not on file  . Marital Status: Not on file  Intimate Partner Violence:   . Fear of Current or Ex-Partner: Not on  file  . Emotionally Abused: Not on file  . Physically Abused: Not on file  . Sexually Abused: Not on file    Past Medical History, Surgical history, Social history, and Family history were reviewed and updated as appropriate.   Please see review of systems for further details on the patient's review from today.   Objective:   Physical Exam:  Wt 142 lb (64.4 kg)   BMI 20.97 kg/m   Physical Exam Constitutional:      General: She is not in acute distress. Musculoskeletal:        General: No deformity.  Neurological:     Mental Status: She is alert and oriented to person, place, and time.     Coordination: Coordination normal.  Psychiatric:        Attention and Perception: Attention and perception normal. She does not perceive auditory or visual hallucinations.        Mood and Affect: Affect is not labile, blunt, angry or inappropriate.        Speech: Speech normal.        Behavior: Behavior normal.        Thought Content: Thought content normal. Thought content is not paranoid or delusional. Thought content does not include homicidal or suicidal ideation. Thought content does not include homicidal or suicidal plan.        Cognition and Memory: Cognition and memory normal.        Judgment: Judgment normal.     Comments: Insight intact Mood presents as less anxious     Lab Review:     Component Value Date/Time   NA 139 06/26/2017 0025   NA 138 08/31/2011 1624   K 3.7 06/26/2017 0025   K 4.1 08/31/2011 1624   CL 105 06/26/2017 0025   CL 102 08/31/2011 1624   CO2 25 06/26/2017 0025   CO2 27 08/31/2011 1624   GLUCOSE 110 (H) 06/26/2017 0025   GLUCOSE 81 08/31/2011 1624   BUN 9 06/26/2017 0025   BUN 10 08/31/2011 1624   CREATININE 0.88 06/26/2017 0025   CREATININE 0.88 08/31/2011 1624   CALCIUM 9.1 06/26/2017 0025  CALCIUM 9.1 08/31/2011 1624   PROT 7.4 06/26/2017 0025   PROT 7.8 08/31/2011 1624   ALBUMIN 4.5 06/26/2017 0025   ALBUMIN 4.0 08/31/2011 1624   AST 19  06/26/2017 0025   AST 25 08/31/2011 1624   ALT 14 06/26/2017 0025   ALT 24 08/31/2011 1624   ALKPHOS 49 06/26/2017 0025   ALKPHOS 52 08/31/2011 1624   BILITOT 0.5 06/26/2017 0025   BILITOT 0.3 08/31/2011 1624   GFRNONAA >60 06/26/2017 0025   GFRNONAA >60 08/31/2011 1624   GFRAA >60 06/26/2017 0025   GFRAA >60 08/31/2011 1624       Component Value Date/Time   WBC 6.4 06/26/2017 0025   RBC 3.85 06/26/2017 0025   HGB 12.1 06/26/2017 0025   HGB 12.7 08/31/2011 1624   HCT 34.8 (L) 06/26/2017 0025   HCT 37.7 08/31/2011 1624   PLT 246 06/26/2017 0025   PLT 248 08/31/2011 1624   MCV 90.3 06/26/2017 0025   MCV 90 08/31/2011 1624   MCH 31.5 06/26/2017 0025   MCHC 34.9 06/26/2017 0025   RDW 11.9 06/26/2017 0025   RDW 12.6 08/31/2011 1624   LYMPHSABS 2.2 04/07/2017 1145   MONOABS 0.4 04/07/2017 1145   EOSABS 0.1 04/07/2017 1145   BASOSABS 0.0 04/07/2017 1145    No results found for: POCLITH, LITHIUM   No results found for: PHENYTOIN, PHENOBARB, VALPROATE, CBMZ   .res Assessment: Plan:   Discussed possible anti-depressant options, such as Pristiq or Cymbalta. Pt reports that she would prefer to continue current medications without changes since she has noticed some recent improvement in mood and anxiety s/s.  Will change Klonopin to 2 separate scripts, 0.5 mg BID and also 1 mg po QHS.  Continue Latuda 80 mg daily with meals for mood and anxiety.  Recommend contuing therapy with Stevphen Meuse, [facility]. Pt to follow up in 6 weeks or sooner if clinically indicated.  Patient advised to contact office with any questions, adverse effects, or acute worsening in signs and symptoms.   Peggy Ruiz was seen today for follow-up.  Diagnoses and all orders for this visit:  PTSD (post-traumatic stress disorder) -     cloNIDine (CATAPRES) 0.1 MG tablet; Take 1 tab po QHS and 1 qd prn -     clonazePAM (KLONOPIN) 1 MG tablet; Take 1 tablet (1 mg total) by mouth at bedtime. -     clonazePAM (KLONOPIN)  0.5 MG tablet; Take 1 tablet in the morning and 1 tablet mid-day.  Generalized anxiety disorder -     cloNIDine (CATAPRES) 0.1 MG tablet; Take 1 tab po QHS and 1 qd prn -     clonazePAM (KLONOPIN) 1 MG tablet; Take 1 tablet (1 mg total) by mouth at bedtime. -     clonazePAM (KLONOPIN) 0.5 MG tablet; Take 1 tablet in the morning and 1 tablet mid-day.     Please see After Visit Summary for patient specific instructions.  Future Appointments  Date Time Provider Department Center  03/31/2020  2:00 PM Stevphen Meuse, [facility] None  04/14/2020  2:00 PM Stevphen Meuse, [facility] None  04/28/2020  2:00 PM Stevphen Meuse, [facility] None  05/05/2020  1:30 PM Corie Chiquito, [facility] CP-CP None  05/12/2020  2:00 PM Stevphen Meuse, [facility] CP-CP None  05/26/2020  2:00 PM Stevphen Meuse, [facility] CP-CP None    No orders of the defined types were placed in this encounter.   -------------------------------

## 2020-03-31 ENCOUNTER — Ambulatory Visit (INDEPENDENT_AMBULATORY_CARE_PROVIDER_SITE_OTHER): Payer: PRIVATE HEALTH INSURANCE | Admitting: Psychiatry

## 2020-03-31 ENCOUNTER — Other Ambulatory Visit: Payer: Self-pay

## 2020-03-31 DIAGNOSIS — F4310 Post-traumatic stress disorder, unspecified: Secondary | ICD-10-CM | POA: Diagnosis not present

## 2020-03-31 NOTE — Progress Notes (Signed)
      [facility] Counselor/Therapist Progress Note  Patient ID: Peggy Ruiz, MRN: [id number],    Date: 03/31/2020  Time Spent: 52 minutes start time 2:02 PM end time 2:54 PM  Treatment Type: Individual Therapy  Reported Symptoms: anxiety, triggered responses, crying spells,panic, sadness, fatigue  Mental Status Exam:  Appearance:   Well Groomed     Behavior:  Appropriate  Motor:  Normal  Speech/Language:   Normal Rate  Affect:  Appropriate  Mood:  anxious  Thought process:  circumstantial  Thought content:    WNL  Sensory/Perceptual disturbances:    WNL  Orientation:  oriented to person, place, time/date and situation  Attention:  Good  Concentration:  Good  Memory:  WNL  Fund of knowledge:   Good  Insight:    Good  Judgment:   Good  Impulse Control:  Good   Risk Assessment: Danger to Self:  No Self-injurious Behavior: No Danger to Others: No Duty to Warn:no Physical Aggression / Violence:No  Access to Firearms a concern: No  Gang Involvement:No   Subjective: Patient was present for session.  She shared she had a hard day on Sunday.   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  She admitted she did not pay attention to her body's warning signs.  Patient is very upset over not being able to complete the day without her body struggling.  Patient did EMDR set on her exhaustion when she came home, suds level 10, negative cognition "I am never going to be better" felt sadness in her chest and stomach.  Patient was able to reduce suds level to 7.  Discussed the importance of her working on her thoughts and recognizing what her feelings are telling her and trying to put the correct core belief into her brain.  Patient was encouraged to remind herself regularly that she is going to get better.  She is to write out times where she has made progress in the past and remind herself that she can make progress in  the future as well.  Patient was encouraged to watch podcast by Dr. De Burrs to help her continue to learn other skills to help focus on changing her thought patterns.  Interventions: Solution-Oriented/Positive Psychology and Eye Movement Desensitization and Reprocessing (EMDR)  Diagnosis:   ICD-10-CM   1. Posttraumatic stress disorder  F43.10     Plan: Patient is to use CBT and coping skills to decrease triggered responses.  Patient is to work on her core believes and to remind herself that she is going to get better and to repeat facts that remind her of that truth regularly.  Patient is to work on taking things 1 step at a time and trying to set realistic expectations of herself.  Patient is to continue taking medication as directed. Long-term goal: Interact normally with friends and family without irrational fears or intrusive thoughts that control behavior.  Display a full range of emotions without experiencing loss of control Short-term goal: Identify and replace negative self talk and catastrophic sizing that is associated with past trauma and current stimuli triggers for anxiety  Stevphen Meuse, [facility]

## 2020-04-14 ENCOUNTER — Other Ambulatory Visit: Payer: Self-pay

## 2020-04-14 ENCOUNTER — Ambulatory Visit (INDEPENDENT_AMBULATORY_CARE_PROVIDER_SITE_OTHER): Payer: PRIVATE HEALTH INSURANCE | Admitting: Psychiatry

## 2020-04-14 DIAGNOSIS — F4310 Post-traumatic stress disorder, unspecified: Secondary | ICD-10-CM

## 2020-04-14 NOTE — Progress Notes (Signed)
[facility] Counselor/Therapist Progress Note  Patient ID: Peggy Ruiz, MRN: [id number],    Date: 04/14/2020  Time Spent: 52 minutes start time 2:02 PM end time 2:54 PM  Treatment Type: Individual Therapy  Reported Symptoms: anxiety, triggered responses, health issues, anger, frustration  Mental Status Exam:  Appearance:   Well Groomed     Behavior:  Appropriate  Motor:  Normal  Speech/Language:   Normal Rate  Affect:  Appropriate  Mood:  anxious  Thought process:  normal  Thought content:    WNL  Sensory/Perceptual disturbances:    WNL  Orientation:  oriented to person, place, time/date and situation  Attention:  Good  Concentration:  Good  Memory:  WNL  Fund of knowledge:   Good  Insight:    Good  Judgment:   Good  Impulse Control:  Good   Risk Assessment: Danger to Self:  No Self-injurious Behavior: No Danger to Others: No Duty to Warn:no Physical Aggression / Violence:No  Access to Firearms a concern: No  Gang Involvement:No   Subjective: Patient was present for session. She shared she had followed recommendation from last session and listened to Dr. De Burrs and she found it very helpful. She shared the different things that she found helpful in the podcast.  She reported that she loved the way things were broken down so she could understand how to use the tools discussed.  Patient went on to share that she and her husband have been struggling.  She explained that her husband is not wanting to set limits with his family and that is very difficult and triggering for her.  She explained that they will be going away for Christmas and she is excited about that so they do not have to deal with that dynamic.  At the same time he is not completely setting limits on his family contact.  Patient stated that husband is working on that issue with his clinician and she is trying to figure out why it is so triggering for her.  Allowed her to process the situation and to  use skills from Dr. De Burrs concerning negative core beliefs.  Patient was able to recognize that what is triggered is that she does not matter.  Patient was then encouraged to think through what the facts are about the situation.  Patient was able to identify several triggers that helped her to feel better and recognize the situation for what it is.  Patient was encouraged to practice that technique whenever she is flooded with emotion to keep working on changing her subconscious tapes.  Patient reported feeling better at the end of session and she was able to figure out how to communicate what is being triggered for her with her husband and ways that she can support him appropriately.  Interventions: Cognitive Behavioral Therapy and Solution-Oriented/Positive Psychology  Diagnosis:   ICD-10-CM   1. Posttraumatic stress disorder  F43.10     Plan: Patient is to continue using CBT and coping skills to decrease triggered responses.  Patient is to work on skills discussed in session from information regarding Dr. Rayfield Citizen leaf.  Patient is to communicate what she is recognizing within herself and how she wants to support him with her husband.  Patient is to continue listening to podcasts by Dr. De Burrs to help her work on her thoughts and decrease triggered responses. Long-term goal: Interact normally with friends and family without irrational fears or intrusive thoughts that control behavior.  Display a full range of emotions without experiencing loss of control Short-term goal: Identify and replace negative self talk and catastrophic sizing that is associated with past trauma and current stimuli triggers for anxiety  Stevphen Meuse, [facility]

## 2020-04-28 ENCOUNTER — Ambulatory Visit (INDEPENDENT_AMBULATORY_CARE_PROVIDER_SITE_OTHER): Payer: PRIVATE HEALTH INSURANCE | Admitting: Psychiatry

## 2020-04-28 ENCOUNTER — Other Ambulatory Visit: Payer: Self-pay

## 2020-04-28 DIAGNOSIS — F4310 Post-traumatic stress disorder, unspecified: Secondary | ICD-10-CM | POA: Diagnosis not present

## 2020-04-28 NOTE — Progress Notes (Signed)
[facility] Counselor/Therapist Progress Note  Patient ID: Peggy Ruiz, MRN: [id number],    Date: 04/28/2020  Time Spent: 50 minutes start time 2:05 PM end time 2:55 PM  Treatment Type: Individual Therapy  Reported Symptoms: anxiety, chronic pain issues, sadness, triggered responses, fatigue  Mental Status Exam:  Appearance:   Well Groomed     Behavior:  Appropriate  Motor:  Normal  Speech/Language:   Normal Rate  Affect:  Appropriate  Mood:  anxious and sad  Thought process:  circumstantial  Thought content:    WNL  Sensory/Perceptual disturbances:    WNL  Orientation:  oriented to person, place, time/date and situation  Attention:  Good  Concentration:  Good  Memory:  WNL  Fund of knowledge:   Good  Insight:    Good  Judgment:   Good  Impulse Control:  Good   Risk Assessment: Danger to Self:  No Self-injurious Behavior: No Danger to Others: No Duty to Warn:no Physical Aggression / Violence:No  Access to Firearms a concern: No  Gang Involvement:No   Subjective: Patient was present for session.  She shared that she has been having lots of tests recently on different body parts.  She has been having her esophageus and stomach have been having issues and her back is having issues as well.  Patient reported she should get all the test results next week and she hopes that she and the doctor will be able to develop plans to deal with the situations appropriately.  Patient went on to share that she and her husband are continuing to figure out how to deal with family issues.  She shared that he was able to set limits with his family and let them know that they would not be coming for the holidays.  Patient explained that was a relief for her even though it was hard to see her husband upset.  Patient went on to share that she was very upset because it was difficult for her to handle him volunteering at the church on Sundays because she could not do it physically it met that  was another day of the week that she was alone.  She explained that she was finally able to talk to her husband about it and expressed sadness over not being who she wants to be for him.  Patient stated that they decided at this point he is just cannot take a break from his volunteering until she gets healthy and is able to participate in things as well.  Patient shared she is continuing to listen to the podcast by Dr. Tawanna Solo and that is helping her thoughts and she is working on memory issues.  She explained at this point there is not much else she can do even though that is frustrating for her at least she can work on trying to put positive thoughts in her head.  Patient was encouraged to feel good about the fact that even with all the disappointment she is still trying to do the right things for her.  Encouraged her to continue working on her thoughts and listening to the podcasts.  Interventions: Solution-Oriented/Positive Psychology  Diagnosis:   ICD-10-CM   1. Posttraumatic stress disorder  F43.10     Plan: Patient is to use CBT and coping skills to decrease triggered responses.  Patient is to continue working with her physicians to figure out what is going on with her physically.  Patient is to continue watching podcast by  Dr. Tawanna Solo to work on changing her toxic thoughts.  Long-term goal: Interact normally with friends and family without irrational fears or intrusive thoughts that control behavior.  Display a full range of emotions without loss of control Short-term goal: Identify and replace negative self talk and catastrophic sizing that is associated with past trauma and current stimuli triggers for anxiety  Lina Sayre, [facility]

## 2020-05-05 ENCOUNTER — Other Ambulatory Visit: Payer: Self-pay

## 2020-05-05 ENCOUNTER — Ambulatory Visit (INDEPENDENT_AMBULATORY_CARE_PROVIDER_SITE_OTHER): Payer: PRIVATE HEALTH INSURANCE | Admitting: Psychiatry

## 2020-05-05 ENCOUNTER — Encounter: Payer: Self-pay | Admitting: Psychiatry

## 2020-05-05 DIAGNOSIS — F411 Generalized anxiety disorder: Secondary | ICD-10-CM | POA: Diagnosis not present

## 2020-05-05 DIAGNOSIS — F4310 Post-traumatic stress disorder, unspecified: Secondary | ICD-10-CM | POA: Diagnosis not present

## 2020-05-05 DIAGNOSIS — F39 Unspecified mood [affective] disorder: Secondary | ICD-10-CM | POA: Diagnosis not present

## 2020-05-05 MED ORDER — LURASIDONE HCL 80 MG PO TABS: 80.0000 mg | ORAL_TABLET | Freq: Every day | ORAL | 2 refills | Status: DC

## 2020-05-05 MED ORDER — CLONAZEPAM 0.5 MG PO TABS: ORAL_TABLET | ORAL | 2 refills | Status: DC

## 2020-05-05 NOTE — Progress Notes (Signed)
Peggy ShadowLeah C Grindstaff [id number] 09/28/1985 34 y.o.  Subjective:   Patient ID:  Peggy Ruiz is a 34 y.o. (DOB 01/17/1986) female.  Chief Complaint:  Chief Complaint  Patient presents with  . Follow-up    Anxiety, mood disturbance, and insomnia    HPI Peggy ShadowLeah C Petrich presents to the office today for follow-up of mood disturbance, anxiety, and insomnia. Continues GI work-up for GI s/s. She reports that chores have been limited due to pain and numbness in lower extremity. She reports that her anxiety has "actually been ok." She reports that she has not been having catastrophic thinking. She reports that she is no longer experiencing any withdrawal s/s with stopping Trileptal and Librium. She reports that anxiety s/s have been better controlled with having 2 scripts for different strengths of Klonopin. Denies any recent panic attacks or uncontrolled crying episodes. "I feel like I am stable."  She reports that mood has been stable overall. Occasional sadness in response to certain triggers. She reports occasional irritability in response to certain things. Sleeping well for the most part. She reports awakening 1-2 times a night to use the bathroom and is able to return to sleep within 10-15 minutes. Has been feeling somewhat more rested upon awakening- "sleep is the best it has been in a little while."  Appetite has been low and reports that she is not interested in eating. Reports that her daily caloric intake is around 1100 kcal. Energy and motivation have been good. Some difficulty with comprehension and will read things several times to grasp content. She reports concentration is adequate for certain things. Denies SI.   Hus[place] continues to work in [place]. They have bought a house in [place] that should be ready around February or March.   Past Psychiatric Medication Trials: Cymbalta Prozac Lexapro Paxil- Initially helped and then did not seem to be as effective when used without  Zyprexa Effexor Nortriptyline-prescribed by GI for gastroparesis Remeron-caused insomnia Buspar- Adverse reaction Abilify-involuntary movements Zyprexa-effective for a period of time. Caused excessive weight gain at times. Seroquel Latuda-effective. Sleeping "harder" on 80 mg. Lamictal Gabapentin- Cannot tolerate doses higher than 800 mg BID Trileptal- Reports that she was unable to tolerate 900 mg QHS Belsomra-helpful with sleep quality but not sleep quantity. Klonopin Ativan Librium Clonidine- Effective Propranolol Trazodone Ambien Nuvigil Adderall Deplin   AIMS     Office Visit from 02/21/2020 in [facility] Office Visit from 02/25/2019 in [facility]  AIMS Total Score 0 0       Review of Systems:  Review of Systems  Gastrointestinal:       Acid reflux  Musculoskeletal: Positive for back pain. Negative for gait problem.       Pain and numbness in left leg  Neurological: Negative for tremors.  Psychiatric/Behavioral:       Please refer to HPI   Had MRI of low back. Has steroid injection scheduled for next week.   Had 2 outpatient GI procedures last week. Sees GI specialist Thursday to discuss results. Reports that results indicate non-acid reflux.  Recent dental work that caused infected ulcers.   Medications: I have reviewed the patient's current medications.  Current Outpatient Medications  Medication Sig Dispense Refill  . azithromycin (ZITHROMAX) 250 MG tablet Take by mouth.    Melene Muller. [START ON 05/25/2020] clonazePAM (KLONOPIN) 0.5 MG tablet Take 1 tablet in the morning and 1 tablet mid-day. 60 tablet 2  . cloNIDine (CATAPRES) 0.1 MG tablet Take 1 tab po QHS  and 1 qd prn 180 tablet 0  . dexlansoprazole (DEXILANT) 60 MG capsule Take 60 mg by mouth every morning.    . famotidine (PEPCID) 20 MG tablet Take 20 mg by mouth 2 (two) times daily.     Marland Kitchen gabapentin (NEURONTIN) 800 MG tablet Take 800 mg by mouth 2 (two) times daily.     Marland Kitchen levothyroxine (SYNTHROID, LEVOTHROID) 50 MCG tablet Take 50 mcg by mouth daily.    Marland Kitchen Lifitegrast (XIIDRA) 5 % SOLN Apply to eye.    . magnesium 30 MG tablet Take 30 mg by mouth 2 (two) times daily.    . methocarbamol (ROBAXIN) 750 MG tablet Take 750 mg by mouth every 8 (eight) hours as needed.     . Multiple Vitamin (MULTIVITAMIN) tablet Take 1 tablet by mouth daily.    . nortriptyline (PAMELOR) 10 MG capsule Take 1 capsule (10 mg total) by mouth at bedtime. 90 capsule 0  . ondansetron (ZOFRAN-ODT) 8 MG disintegrating tablet Take 8 mg by mouth every 8 (eight) hours as needed for nausea.    . Probiotic Product (PROBIOTIC DAILY PO) Take by mouth.    . clonazePAM (KLONOPIN) 1 MG tablet Take 1 tablet (1 mg total) by mouth at bedtime. 30 tablet 1  . levonorgestrel-ethinyl estradiol (SEASONALE,INTROVALE,JOLESSA) 0.15-0.03 MG tablet Take 1 tablet by mouth daily.    Melene Muller ON 05/26/2020] lurasidone (LATUDA) 80 MG TABS tablet Take 1 tablet (80 mg total) by mouth daily with supper. 30 tablet 2   No current facility-administered medications for this visit.    Medication Side Effects: None  Allergies:  Allergies  Allergen Reactions  . Promethazine     Other reaction(s): Agitation  . Ciprofloxacin Other (See Comments)    c-diff  . Dilaudid [Hydromorphone]     sob  . Epinephrine   . Penicillins Hives    Has patient had a PCN reaction causing immediate rash, facial/tongue/throat swelling, SOB or lightheadedness with hypotension: Yes Has patient had a PCN reaction causing severe rash involving mucus membranes or skin necrosis: No Has patient had a PCN reaction that required hospitalization: No Has patient had a PCN reaction occurring within the last 10 years: Yes If all of the above answers are "NO", then may proceed with Cephalosporin use.   . Toradol [Ketorolac Tromethamine] Nausea And Vomiting    Past Medical History:  Diagnosis Date  . Anorexia    at 15  . Anxiety   . Attention  deficit disorder (ADD)   . Bone spur   . Depression   . Eczema   . HPV (human papilloma virus) infection     Family History  Problem Relation Age of Onset  . Fibromyalgia Mother   . Anxiety disorder Mother   . Depression Mother   . Depression Father   . Anxiety disorder Father     Social History   Socioeconomic History  . Marital status: Single    Spouse name: Not on file  . Number of children: Not on file  . Years of education: Not on file  . Highest education level: Not on file  Occupational History  . Not on file  Tobacco Use  . Smoking status: Never Smoker  . Smokeless tobacco: Never Used  Vaping Use  . Vaping Use: Never used  Substance and Sexual Activity  . Alcohol use: Yes    Comment: occasion  . Drug use: No  . Sexual activity: Never  Other Topics Concern  . Not on file  Social History Narrative   Regular exercise: none   Caffeine use: daily   Social Determinants of Health   Financial Resource Strain:   . Difficulty of Paying Living Expenses: Not on file  Food Insecurity:   . Worried About Programme researcher, broadcasting/film/video in the Last Year: Not on file  . Ran Out of Food in the Last Year: Not on file  Transportation Needs:   . Lack of Transportation (Medical): Not on file  . Lack of Transportation (Non-Medical): Not on file  Physical Activity:   . Days of Exercise per Week: Not on file  . Minutes of Exercise per Session: Not on file  Stress:   . Feeling of Stress : Not on file  Social Connections:   . Frequency of Communication with Friends and Family: Not on file  . Frequency of Social Gatherings with Friends and Family: Not on file  . Attends Religious Services: Not on file  . Active Member of Clubs or Organizations: Not on file  . Attends Banker Meetings: Not on file  . Marital Status: Not on file  Intimate Partner Violence:   . Fear of Current or Ex-Partner: Not on file  . Emotionally Abused: Not on file  . Physically Abused: Not on file   . Sexually Abused: Not on file    Past Medical History, Surgical history, Social history, and Family history were reviewed and updated as appropriate.   Please see review of systems for further details on the patient's review from today.   Objective:   Physical Exam:  Wt 143 lb (64.9 kg)   BMI 21.12 kg/m   Physical Exam Constitutional:      General: She is not in acute distress. Musculoskeletal:        General: No deformity.  Neurological:     Mental Status: She is alert and oriented to person, place, and time.     Coordination: Coordination normal.  Psychiatric:        Attention and Perception: Attention and perception normal. She does not perceive auditory or visual hallucinations.        Mood and Affect: Mood normal. Mood is not anxious or depressed. Affect is not labile, blunt, angry or inappropriate.        Speech: Speech normal.        Behavior: Behavior normal.        Thought Content: Thought content normal. Thought content is not paranoid or delusional. Thought content does not include homicidal or suicidal ideation. Thought content does not include homicidal or suicidal plan.        Cognition and Memory: Cognition and memory normal.        Judgment: Judgment normal.     Comments: Insight intact     Lab Review:     Component Value Date/Time   NA 139 06/26/2017 0025   NA 138 08/31/2011 1624   K 3.7 06/26/2017 0025   K 4.1 08/31/2011 1624   CL 105 06/26/2017 0025   CL 102 08/31/2011 1624   CO2 25 06/26/2017 0025   CO2 27 08/31/2011 1624   GLUCOSE 110 (H) 06/26/2017 0025   GLUCOSE 81 08/31/2011 1624   BUN 9 06/26/2017 0025   BUN 10 08/31/2011 1624   CREATININE 0.88 06/26/2017 0025   CREATININE 0.88 08/31/2011 1624   CALCIUM 9.1 06/26/2017 0025   CALCIUM 9.1 08/31/2011 1624   PROT 7.4 06/26/2017 0025   PROT 7.8 08/31/2011 1624   ALBUMIN 4.5 06/26/2017 0025  ALBUMIN 4.0 08/31/2011 1624   AST 19 06/26/2017 0025   AST 25 08/31/2011 1624   ALT 14  06/26/2017 0025   ALT 24 08/31/2011 1624   ALKPHOS 49 06/26/2017 0025   ALKPHOS 52 08/31/2011 1624   BILITOT 0.5 06/26/2017 0025   BILITOT 0.3 08/31/2011 1624   GFRNONAA >60 06/26/2017 0025   GFRNONAA >60 08/31/2011 1624   GFRAA >60 06/26/2017 0025   GFRAA >60 08/31/2011 1624       Component Value Date/Time   WBC 6.4 06/26/2017 0025   RBC 3.85 06/26/2017 0025   HGB 12.1 06/26/2017 0025   HGB 12.7 08/31/2011 1624   HCT 34.8 (L) 06/26/2017 0025   HCT 37.7 08/31/2011 1624   PLT 246 06/26/2017 0025   PLT 248 08/31/2011 1624   MCV 90.3 06/26/2017 0025   MCV 90 08/31/2011 1624   MCH 31.5 06/26/2017 0025   MCHC 34.9 06/26/2017 0025   RDW 11.9 06/26/2017 0025   RDW 12.6 08/31/2011 1624   LYMPHSABS 2.2 04/07/2017 1145   MONOABS 0.4 04/07/2017 1145   EOSABS 0.1 04/07/2017 1145   BASOSABS 0.0 04/07/2017 1145    No results found for: POCLITH, LITHIUM   No results found for: PHENYTOIN, PHENOBARB, VALPROATE, CBMZ   .res Assessment: Plan:   Will continue current plan of care since target signs and symptoms are well controlled without any tolerability issues. Pt to follow-up in 2 months or sooner if clinically indicated.  Recommend continuing therapy with Stevphen Meuse, [facility]. Patient advised to contact office with any questions, adverse effects, or acute worsening in signs and symptoms.  Traniya was seen today for follow-up.  Diagnoses and all orders for this visit:  Mild mood disorder (HCC) -     lurasidone (LATUDA) 80 MG TABS tablet; Take 1 tablet (80 mg total) by mouth daily with supper.  PTSD (post-traumatic stress disorder) -     clonazePAM (KLONOPIN) 0.5 MG tablet; Take 1 tablet in the morning and 1 tablet mid-day.  Generalized anxiety disorder -     clonazePAM (KLONOPIN) 0.5 MG tablet; Take 1 tablet in the morning and 1 tablet mid-day.     Please see After Visit Summary for patient specific instructions.  Future Appointments  Date Time Provider Department Center   05/12/2020  2:00 PM Stevphen Meuse, [facility] None  05/26/2020  2:00 PM Stevphen Meuse, [facility] None  06/09/2020  2:00 PM Stevphen Meuse, [facility] None  06/30/2020  2:00 PM Stevphen Meuse, [facility] None  06/30/2020  3:30 PM Corie Chiquito, PMHNP CP-CP None    No orders of the defined types were placed in this encounter.   -------------------------------

## 2020-05-12 ENCOUNTER — Ambulatory Visit (INDEPENDENT_AMBULATORY_CARE_PROVIDER_SITE_OTHER): Payer: PRIVATE HEALTH INSURANCE | Admitting: Psychiatry

## 2020-05-12 ENCOUNTER — Other Ambulatory Visit: Payer: Self-pay

## 2020-05-12 DIAGNOSIS — F4310 Post-traumatic stress disorder, unspecified: Secondary | ICD-10-CM | POA: Diagnosis not present

## 2020-05-12 NOTE — Progress Notes (Signed)
[facility] Counselor/Therapist Progress Note  Patient ID: Peggy Ruiz, MRN: [id number],    Date: 05/12/2020  Time Spent: 52 minutes start time 2:06 PM end time 2:58 PM  Treatment Type: Individual Therapy  Reported Symptoms: Pain, anxiety, sadness, triggered responses  Mental Status Exam:  Appearance:   Casual and Neat     Behavior:  Appropriate  Motor:  Normal  Speech/Language:   Normal Rate  Affect:  Appropriate  Mood:  normal  Thought process:  normal  Thought content:    WNL  Sensory/Perceptual disturbances:    WNL  Orientation:  oriented to person, place, time/date and situation  Attention:  Good  Concentration:  Good  Memory:  WNL  Fund of knowledge:   Good  Insight:    Good  Judgment:   Good  Impulse Control:  Good   Risk Assessment: Danger to Self:  No Self-injurious Behavior: No Danger to Others: No Duty to Warn:no Physical Aggression / Violence:No  Access to Firearms a concern: No  Gang Involvement:No   Subject Patient was present for session.  She shared that she got the test results back on the tests.  She shared that she is having reflux but it is not acidic so there is nothing that she can do about it.  She was started on a new medication to try and help it. 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  She also has a small bulging disc which is causing nerves to rub up against it which is causing them to be inflamed.  She will be getting nerve blocks to try and decrease her pain. She was able to start the process of buying a house.  Patient stated she is very excited about buying a house but she is very sad because able to move her away from her support system.  Patient explained she is finally starting to develop some relationships in her church that have been helpful and she does not want to have to give them out.  Was encouraged to think through ways that she can have a balance  so that she can have her new home which would be healthy for her at the same time not give up all of her support networks.  Patient shared that she will continue with clinician due to the progress she is made she wants to continue working through past traumas and developing healthy coping skills.  Patient also reported that she and her husband have decided to host at Thanksgiving dinner.  She shared that was a huge step for both of them and that they are both working on trying to set healthy limits with family members and do the things that they want to do instead of being controlled by other people's desires.  Patient was encouraged to feel positive about that decision and to continue using her CBT skills to help talk her self through positive choices.  Interventions: Cognitive Behavioral Therapy and Solution-Oriented/Positive Psychology  Diagnosis:   ICD-10-CM   1. Posttraumatic stress disorder  F43.10     Plan: Patient is to use CBT and coping skills to decrease triggered responses.  Patient is to continue working on health issues to try and decrease pain.  Patient is to continue working on limit setting and having healthy boundaries.  Patient is to think through ways that she can continue relationships with the support system that she is  starting to develop even after she moves. Long-term goal: Interact normally with friends and family without irrational fears or intrusive thoughts that control behavior.  Display a full range of emotions without loss of control Short-term goal: Identify and replace negative self talk and catastrophic sizing that is associated with past trauma and current stimuli triggers for anxiety.  Stevphen Meuse, [facility]

## 2020-05-26 ENCOUNTER — Ambulatory Visit (INDEPENDENT_AMBULATORY_CARE_PROVIDER_SITE_OTHER): Payer: PRIVATE HEALTH INSURANCE | Admitting: Psychiatry

## 2020-05-26 ENCOUNTER — Other Ambulatory Visit: Payer: Self-pay

## 2020-05-26 DIAGNOSIS — F4310 Post-traumatic stress disorder, unspecified: Secondary | ICD-10-CM

## 2020-05-26 NOTE — Progress Notes (Signed)
      [facility] Counselor/Therapist Progress Note  Patient ID: Peggy Ruiz, MRN: [id number],    Date: 05/26/2020  Time Spent: 51 minutes start time 2:04 PM end time 2:55 PM  Treatment Type: Individual Therapy  Reported Symptoms: anxiety, sadness,triggered responses, panic  Mental Status Exam:  Appearance:   Well Groomed     Behavior:  Appropriate  Motor:  Normal  Speech/Language:   Normal Rate  Affect:  Appropriate  Mood:  anxious  Thought process:  normal  Thought content:    WNL  Sensory/Perceptual disturbances:    WNL  Orientation:  oriented to person, place, time/date and situation  Attention:  Good  Concentration:  Good  Memory:  WNL  Fund of knowledge:   Good  Insight:    Good  Judgment:   Good  Impulse Control:  Good   Risk Assessment: Danger to Self:  No Self-injurious Behavior: No Danger to Others: No Duty to Warn:no Physical Aggression / Violence:No  Access to Firearms a concern: No  Gang Involvement:No   Subjective: Patient was present for session.  She shared that her mother was in the hospital due to COVID and has pneumonia in both lungs. She shared her mother doesn't sound good and she isn't handling it well at all.  Patient was encouraged to try and focus on the things that she can do something about.  Patient stated it is been very difficult because she is having lots of her own health issues and extreme pain and has been unable to really eat.  Patient was allowed time to share her feelings and emotions and then encouraged to start researching what she can do to help improve her immune system as well as her husband's and her father's.  She was also encouraged to research what is helpful to help recover from COVID faster to help her mother when she does get out.   Interventions: Solution-Oriented/Positive Psychology  Diagnosis:   ICD-10-CM   1. Posttraumatic stress disorder  F43.10     Plan: Patient is to use CBT and coping skills to decrease  triggered responses.  Patient is to research what can help the immune system and what can help recover from COVID. Long-term goal: Interact normally with friends and family without irrational fears or intrusive thoughts that control behavior.  Display a full range of emotions without experiencing loss of control Short-term goal: Identify and replace negative self talk and catastrophic sizing that is associated with past trauma and current stimuli triggers for anxiety  Stevphen Meuse, [facility]

## 2020-05-29 ENCOUNTER — Other Ambulatory Visit: Payer: Self-pay

## 2020-05-29 ENCOUNTER — Telehealth: Payer: Self-pay | Admitting: Psychiatry

## 2020-05-29 DIAGNOSIS — F411 Generalized anxiety disorder: Secondary | ICD-10-CM

## 2020-05-29 DIAGNOSIS — F4310 Post-traumatic stress disorder, unspecified: Secondary | ICD-10-CM

## 2020-05-29 MED ORDER — CLONAZEPAM 0.5 MG PO TABS: ORAL_TABLET | ORAL | 2 refills | Status: DC

## 2020-05-29 MED ORDER — CLONAZEPAM 1 MG PO TABS: 1.0000 mg | ORAL_TABLET | Freq: Every day | ORAL | 1 refills | Status: DC

## 2020-05-29 NOTE — Telephone Encounter (Signed)
Peggy Ruiz to check status of her refill for her Klonopin.  The pharmacy has told they have sent the request Wed and Today.  She needs to have filled.  Tomorrow is her last dose.  She's been trying since Wednesday to get this filled.  Please send in a prescription for her.  [facility] in [place].  Appt 1/4

## 2020-05-29 NOTE — Telephone Encounter (Signed)
We have not received a refill request from her pharmacy for patient but will pend and have Shanda Bumps send.

## 2020-06-09 ENCOUNTER — Other Ambulatory Visit: Payer: Self-pay

## 2020-06-09 ENCOUNTER — Ambulatory Visit (INDEPENDENT_AMBULATORY_CARE_PROVIDER_SITE_OTHER): Payer: PRIVATE HEALTH INSURANCE | Admitting: Psychiatry

## 2020-06-09 DIAGNOSIS — F4310 Post-traumatic stress disorder, unspecified: Secondary | ICD-10-CM | POA: Diagnosis not present

## 2020-06-09 NOTE — Progress Notes (Signed)
[facility] Counselor/Therapist Progress Note  Patient ID: Peggy Ruiz, MRN: [id number],    Date: 06/09/2020  Time Spent: 52 minutes start time 2:05 PM end time 2:57 PM  Treatment Type: Individual Therapy  Reported Symptoms: anxiety, sadness, triggered responses, health issues  Mental Status Exam:  Appearance:   Well Groomed     Behavior:  Appropriate  Motor:  Normal  Speech/Language:   Normal Rate  Affect:  Appropriate  Mood:  anxious and sad  Thought process:  normal  Thought content:    WNL  Sensory/Perceptual disturbances:    WNL  Orientation:  oriented to person, place, time/date and situation  Attention:  Good  Concentration:  Good  Memory:  WNL  Fund of knowledge:   Good  Insight:    Good  Judgment:   Good  Impulse Control:  Good   Risk Assessment: Danger to Self:  No Self-injurious Behavior: No Danger to Others: No Duty to Warn:no Physical Aggression / Violence:No  Access to Firearms a concern: No  Gang Involvement:No   Subjective: Patient was present for session.  She shared that her mother is doing better and is out of the hospital.  She reported that her husband's grandmother passed this morning which was hard for her husband.  Patient explained she does not handle grief the way other people handle it.  She also shared the people she is lost in her life have been people that are been hurtful and mean so she is been okay with the loss.  She explained she is concerned that she will not look like a supportive and grieving wife to her husband who valued his grandmother very much.  Discussed the importance of her focusing on him and that as she does that she will feel sadness because he will be sad.  Patient went on to explain that one of the people who passed away that she was not concerned about losing was her dad's father.  She shared that they lived with he and her grandmother in [place] for 6 weeks but moved because of an incident with her brother that was  very traumatic.  Patient shared she witnessed the entire episode and still feels lots of panic over what happened.  Patient was encouraged to process through the episode prior to going to [facility] to stay with her aunt for Christmas.  Patient reported that her aunt lives far away from the house that the incident happened in and since he is dead she did not want to try and start that up in today's session but if need be in future sessions she would be willing to try that.  Discussed the importance of her focusing on keeping her brain engaged in positive activity while she is in [place] to help make sure it is a positive trip.  Patient was encouraged to stay focused on facts and if triggered responses surface to use her grounding techniques.  Patient reported she felt she would be fine because she does feel much better overall since working with EMDR.  Interventions: Cognitive Behavioral Therapy and Solution-Oriented/Positive Psychology  Diagnosis:   ICD-10-CM   1. Posttraumatic stress disorder  F43.10     Plan: Patient is to use CBT and coping skills to decrease triggered responses.  Patient is to keep her brain engaged in concrete tangible facts while she is in [place] especially.  If patient does get triggered she is to use grounding techniques.  Patient is to support her husband during  the funeral of his grandmother.  Patient is to continue working with medical providers to deal with health issues Long-term goal: Interact normally with friends and family without irrational fears or intrusive thoughts that control behavior.  Display a full range of emotions without experiencing loss of control Short-term goal: Identify and replace negative self talk and catastrophizing that is associated with past trauma and current stimuli triggers for anxiety  Stevphen Meuse, [facility]

## 2020-06-24 ENCOUNTER — Telehealth: Payer: Self-pay | Admitting: Psychiatry

## 2020-06-24 DIAGNOSIS — F411 Generalized anxiety disorder: Secondary | ICD-10-CM

## 2020-06-24 DIAGNOSIS — F4310 Post-traumatic stress disorder, unspecified: Secondary | ICD-10-CM

## 2020-06-24 MED ORDER — CLONIDINE HCL 0.1 MG PO TABS: ORAL_TABLET | ORAL | 0 refills | Status: DC

## 2020-06-24 NOTE — Telephone Encounter (Signed)
Received faxed request for clonidine from pharmacy.  Prescription sent.

## 2020-06-30 ENCOUNTER — Other Ambulatory Visit: Payer: Self-pay

## 2020-06-30 ENCOUNTER — Ambulatory Visit: Payer: PRIVATE HEALTH INSURANCE | Admitting: Psychiatry

## 2020-06-30 ENCOUNTER — Ambulatory Visit (INDEPENDENT_AMBULATORY_CARE_PROVIDER_SITE_OTHER): Payer: PRIVATE HEALTH INSURANCE | Admitting: Psychiatry

## 2020-06-30 ENCOUNTER — Encounter: Payer: Self-pay | Admitting: Psychiatry

## 2020-06-30 DIAGNOSIS — F39 Unspecified mood [affective] disorder: Secondary | ICD-10-CM

## 2020-06-30 DIAGNOSIS — F4310 Post-traumatic stress disorder, unspecified: Secondary | ICD-10-CM | POA: Diagnosis not present

## 2020-06-30 DIAGNOSIS — F411 Generalized anxiety disorder: Secondary | ICD-10-CM

## 2020-06-30 MED ORDER — CLONAZEPAM 1 MG PO TABS: 1.0000 mg | ORAL_TABLET | Freq: Every day | ORAL | 2 refills | Status: DC

## 2020-06-30 MED ORDER — LURASIDONE HCL 80 MG PO TABS: 80.0000 mg | ORAL_TABLET | Freq: Every day | ORAL | 2 refills | Status: DC

## 2020-06-30 NOTE — Progress Notes (Signed)
      [facility] Counselor/Therapist Progress Note  Patient ID: Peggy Ruiz, MRN: [id number],    Date: 06/30/2020  Time Spent: 52 minutes start time 4:58 PM end time 5:50 PM  Treatment Type: Individual Therapy  Reported Symptoms: anxiety, panic,sadness, crying spells, triggered responses, sleep issues  Mental Status Exam:  Appearance:   Well Groomed     Behavior:  Appropriate  Motor:  Normal  Speech/Language:   Normal Rate  Affect:  Appropriate  Mood:  anxious  Thought process:  normal  Thought content:    WNL  Sensory/Perceptual disturbances:    WNL  Orientation:  oriented to person, place, time/date and situation  Attention:  Good  Concentration:  Good  Memory:  WNL  Fund of knowledge:   Good  Insight:    Good  Judgment:   Good  Impulse Control:  Good   Risk Assessment: Danger to Self:  No Self-injurious Behavior: No Danger to Others: No Duty to Warn:no Physical Aggression / Violence:No  Access to Firearms a concern: No  Gang Involvement:No   Subjective: Patient was present for session.  She shared that she had a panic attack on the way to appointment due to traffic.  She shared that she has been having issues since she returned from her trip.  She shared she has gotten back to reading the body keeps the score and she realized that she has had 2 sleep studies and found out that she does not get into REM sleep. She shared that they got lost to her anniversary dinner late and it caused her to panic.  Patient did EMDR set on the door not opening when she was trying to get to the anniversary dinner, suds level 10, negative cognition "I am trapped" felt anxiety in her chest shoulders and legs.  Patient was able to resolve the set and reduce suds level to 1.  Through the processing she was able to have multiple memories of times that she felt trapped but was able to see that each of those experiences resolve themselves.  She shared that the insight of realizing that things  worked themselves out was very powerful for her and she agreed to work harder on her self talk when she starts feeling anxiety to remind herself of the truth that things will get to the other side and she will be okay.  Patient also shared her work for the year and his gratitude and she is going to try and focus on the things that she appreciates rather than the negatives.  Interventions: Solution-Oriented/Positive Psychology and Eye Movement Desensitization and Reprocessing (EMDR)  Diagnosis:   ICD-10-CM   1. Posttraumatic stress disorder  F43.10     Plan: Patient is to use CBT and coping skills to decrease triggered responses.  Patient is to focus on gratitude this year.  Patient is to continue to try and remind herself of the facts/truth when anxiety surfaces.  Patient will continue working in the book the body keeps the score. Long-term goal: Interact normally with friends and family without irrational fears or intrusive thoughts that control behavior.  Display a full range of emotions without experiencing loss of control Short-term goal: Identify and replace negative self talk and catastrophic sizing that is associated with past trauma and current stimuli triggers for anxiety  Stevphen Meuse, [facility]

## 2020-06-30 NOTE — Progress Notes (Signed)
Peggy Ruiz [id number] 1986-03-10 35 y.o.  Subjective:   Patient ID:  Peggy Ruiz is a 35 y.o. (DOB 06/13/1986) female.  Chief Complaint:  Chief Complaint  Patient presents with  . Follow-up    Mood disturbance, anxiety, and insomnia    HPI [name] presents to the office today for follow-up of mood disturbance, anxiety, and insomnia. She reports that her anxiety has been higher in response to stressors. Had panic attack on the way to the office after being stuck in traffic and missing appointment with therapist before this appointment. She reports that she felt trapped in the car while stuck in traffic. Reports that she has been feeling easily overwhelmed. She has been having difficulty falling asleep over the last few nights and is hearing a song in her head. She has had some increased frustration and irritability. She reports that they did not accomplish everything that she wanted to over the break. She reports, "I feel down. I was crying a couple of nights ago." She reports that she felt better physically while she was in [facility] and fatigue and stomach issues resolved. She reports that all of her s/s returned within 24 hours of getting home. She reports that she was able to eat more regularly while she was in [facility]. They have recently tried to change their diet and switch to bottled water to possibly improve health issues. Energy has been lower since she came home. Appetite has been ok. Concentration has been ok. Has recently picked up a book and was able to read it. Denies SI.   She, her parents, and husband all had COVID before Thanksgiving. Mother had COVID and pneumonia and was hospitalized for 9 days. Mother's health is improving.   They are in the process of building a house and anticipate closing on the house in February and March. She and her husband went to [facility] to see extended family. Husband's grandmother died a few days before their trip.   Past Psychiatric  Medication Trials: Cymbalta Prozac Lexapro Paxil- Initially helped and then did not seem to be as effective when used without Zyprexa Effexor Nortriptyline-prescribed by GI for gastroparesis Remeron-caused insomnia Buspar- Adverse reaction Abilify-involuntary movements Zyprexa-effective for a period of time. Caused excessive weight gain at times. Seroquel Latuda-effective. Sleeping "harder" on 80 mg. Lamictal Gabapentin- Cannot tolerate doses higher than 800 mg BID Trileptal- Reports that she was unable to tolerate 900 mg QHS Belsomra-helpful with sleep quality but not sleep quantity. Klonopin Ativan Librium Clonidine- Effective Propranolol Trazodone Ambien Nuvigil Adderall Deplin   AIMS   Flowsheet Row Office Visit from 02/21/2020 in [place] Office Visit from 02/25/2019 in [facility]  AIMS Total Score 0 0       Review of Systems:  Review of Systems  Musculoskeletal: Negative for gait problem.  Neurological: Negative for tremors.  Psychiatric/Behavioral:       Please refer to HPI    Medications: I have reviewed the patient's current medications.  Current Outpatient Medications  Medication Sig Dispense Refill  . clonazePAM (KLONOPIN) 0.5 MG tablet Take 1 tablet in the morning and 1 tablet mid-day. 60 tablet 2  . cloNIDine (CATAPRES) 0.1 MG tablet Take 1 tab po QHS and 1 qd prn 180 tablet 0  . dexlansoprazole (DEXILANT) 60 MG capsule Take 60 mg by mouth every morning.    . famotidine (PEPCID) 20 MG tablet Take 20 mg by mouth 2 (two) times daily.     Marland Kitchen  gabapentin (NEURONTIN) 800 MG tablet Take 800 mg by mouth 2 (two) times daily.    Marland Kitchen levothyroxine (SYNTHROID, LEVOTHROID) 50 MCG tablet Take 50 mcg by mouth daily.    Marland Kitchen Lifitegrast (XIIDRA) 5 % SOLN Apply to eye.    . magnesium 30 MG tablet Take 30 mg by mouth 2 (two) times daily.    . Multiple Vitamin (MULTIVITAMIN) tablet Take 1 tablet by mouth daily.    . nortriptyline  (PAMELOR) 10 MG capsule Take 1 capsule (10 mg total) by mouth at bedtime. 90 capsule 0  . ondansetron (ZOFRAN-ODT) 8 MG disintegrating tablet Take 8 mg by mouth every 8 (eight) hours as needed for nausea.    . Probiotic Product (PROBIOTIC DAILY PO) Take by mouth.    . clonazePAM (KLONOPIN) 1 MG tablet Take 1 tablet (1 mg total) by mouth at bedtime. 30 tablet 2  . levonorgestrel-ethinyl estradiol (SEASONALE,INTROVALE,JOLESSA) 0.15-0.03 MG tablet Take 1 tablet by mouth daily.    Marland Kitchen lurasidone (LATUDA) 80 MG TABS tablet Take 1 tablet (80 mg total) by mouth daily with supper. 30 tablet 2   No current facility-administered medications for this visit.    Medication Side Effects: None  Allergies:  Allergies  Allergen Reactions  . Promethazine     Other reaction(s): Agitation  . Ciprofloxacin Other (See Comments)    c-diff  . Dilaudid [Hydromorphone]     sob  . Epinephrine   . Penicillins Hives    Has patient had a PCN reaction causing immediate rash, facial/tongue/throat swelling, SOB or lightheadedness with hypotension: Yes Has patient had a PCN reaction causing severe rash involving mucus membranes or skin necrosis: No Has patient had a PCN reaction that required hospitalization: No Has patient had a PCN reaction occurring within the last 10 years: Yes If all of the above answers are "NO", then may proceed with Cephalosporin use.   . Toradol [Ketorolac Tromethamine] Nausea And Vomiting    Past Medical History:  Diagnosis Date  . Anorexia    at 15  . Anxiety   . Attention deficit disorder (ADD)   . Bone spur   . Depression   . Eczema   . HPV (human papilloma virus) infection     Family History  Problem Relation Age of Onset  . Fibromyalgia Mother   . Anxiety disorder Mother   . Depression Mother   . Depression Father   . Anxiety disorder Father     Social History   Socioeconomic History  . Marital status: Single    Spouse name: Not on file  . Number of children: Not  on file  . Years of education: Not on file  . Highest education level: Not on file  Occupational History  . Not on file  Tobacco Use  . Smoking status: Never Smoker  . Smokeless tobacco: Never Used  Vaping Use  . Vaping Use: Never used  Substance and Sexual Activity  . Alcohol use: Yes    Comment: occasion  . Drug use: No  . Sexual activity: Never  Other Topics Concern  . Not on file  Social History Narrative   Regular exercise: none   Caffeine use: daily   Social Determinants of Health   Financial Resource Strain: Not on file  Food Insecurity: Not on file  Transportation Needs: Not on file  Physical Activity: Not on file  Stress: Not on file  Social Connections: Not on file  Intimate Partner Violence: Not on file    Past Medical  History, Surgical history, Social history, and Family history were reviewed and updated as appropriate.   Please see review of systems for further details on the patient's review from today.   Objective:   Physical Exam:  Wt 145 lb (65.8 kg)   BMI 21.41 kg/m   Physical Exam Constitutional:      General: She is not in acute distress. Musculoskeletal:        General: No deformity.  Neurological:     Mental Status: She is alert and oriented to person, place, and time.     Coordination: Coordination normal.  Psychiatric:        Attention and Perception: Attention and perception normal. She does not perceive auditory or visual hallucinations.        Mood and Affect: Mood is anxious. Mood is not depressed. Affect is not labile, blunt, angry or inappropriate.        Speech: Speech normal.        Behavior: Behavior normal.        Thought Content: Thought content normal. Thought content is not paranoid or delusional. Thought content does not include homicidal or suicidal ideation. Thought content does not include homicidal or suicidal plan.        Cognition and Memory: Cognition and memory normal.        Judgment: Judgment normal.      Comments: Insight intact     Lab Review:     Component Value Date/Time   NA 139 06/26/2017 0025   NA 138 08/31/2011 1624   K 3.7 06/26/2017 0025   K 4.1 08/31/2011 1624   CL 105 06/26/2017 0025   CL 102 08/31/2011 1624   CO2 25 06/26/2017 0025   CO2 27 08/31/2011 1624   GLUCOSE 110 (H) 06/26/2017 0025   GLUCOSE 81 08/31/2011 1624   BUN 9 06/26/2017 0025   BUN 10 08/31/2011 1624   CREATININE 0.88 06/26/2017 0025   CREATININE 0.88 08/31/2011 1624   CALCIUM 9.1 06/26/2017 0025   CALCIUM 9.1 08/31/2011 1624   PROT 7.4 06/26/2017 0025   PROT 7.8 08/31/2011 1624   ALBUMIN 4.5 06/26/2017 0025   ALBUMIN 4.0 08/31/2011 1624   AST 19 06/26/2017 0025   AST 25 08/31/2011 1624   ALT 14 06/26/2017 0025   ALT 24 08/31/2011 1624   ALKPHOS 49 06/26/2017 0025   ALKPHOS 52 08/31/2011 1624   BILITOT 0.5 06/26/2017 0025   BILITOT 0.3 08/31/2011 1624   GFRNONAA >60 06/26/2017 0025   GFRNONAA >60 08/31/2011 1624   GFRAA >60 06/26/2017 0025   GFRAA >60 08/31/2011 1624       Component Value Date/Time   WBC 6.4 06/26/2017 0025   RBC 3.85 06/26/2017 0025   HGB 12.1 06/26/2017 0025   HGB 12.7 08/31/2011 1624   HCT 34.8 (L) 06/26/2017 0025   HCT 37.7 08/31/2011 1624   PLT 246 06/26/2017 0025   PLT 248 08/31/2011 1624   MCV 90.3 06/26/2017 0025   MCV 90 08/31/2011 1624   MCH 31.5 06/26/2017 0025   MCHC 34.9 06/26/2017 0025   RDW 11.9 06/26/2017 0025   RDW 12.6 08/31/2011 1624   LYMPHSABS 2.2 04/07/2017 1145   MONOABS 0.4 04/07/2017 1145   EOSABS 0.1 04/07/2017 1145   BASOSABS 0.0 04/07/2017 1145    No results found for: POCLITH, LITHIUM   No results found for: PHENYTOIN, PHENOBARB, VALPROATE, CBMZ   .res Assessment: Plan:    Will continue current plan of care since target signs and  symptoms are well controlled without any tolerability issues. Recommend continuing therapy with Stevphen Meuse, [facility].  Pt to follow-up in 3 months or sooner if clinically indicated.  Patient  advised to contact office with any questions, adverse effects, or acute worsening in signs and symptoms.  Anatasia was seen today for follow-up.  Diagnoses and all orders for this visit:  PTSD (post-traumatic stress disorder) -     clonazePAM (KLONOPIN) 1 MG tablet; Take 1 tablet (1 mg total) by mouth at bedtime.  Generalized anxiety disorder -     clonazePAM (KLONOPIN) 1 MG tablet; Take 1 tablet (1 mg total) by mouth at bedtime.  Mild mood disorder (HCC) -     lurasidone (LATUDA) 80 MG TABS tablet; Take 1 tablet (80 mg total) by mouth daily with supper.     Please see After Visit Summary for patient specific instructions.  Future Appointments  Date Time Provider Department Center  07/14/2020  2:00 PM Stevphen Meuse, [facility] CP-CP None  07/28/2020  2:00 PM Stevphen Meuse, [facility] CP-CP None  08/11/2020  2:00 PM Stevphen Meuse, [facility] CP-CP None  09/02/2020  1:30 PM Corie Chiquito, PMHNP CP-CP None    No orders of the defined types were placed in this encounter.   -------------------------------

## 2020-07-14 ENCOUNTER — Ambulatory Visit (INDEPENDENT_AMBULATORY_CARE_PROVIDER_SITE_OTHER): Payer: PRIVATE HEALTH INSURANCE | Admitting: Psychiatry

## 2020-07-14 DIAGNOSIS — F4310 Post-traumatic stress disorder, unspecified: Secondary | ICD-10-CM

## 2020-07-14 NOTE — Progress Notes (Signed)
[facility] Counselor/Therapist Progress Note  Patient ID: Peggy Ruiz, MRN: [id number],    Date: 07/14/2020  Time Spent: 52 minutes start time 2:01 p.m. end time 2:53 PM Virtual Visit via Telephone Note Connected with patient by a video enabled telemedicine/telehealth application, with their informed consent, and verified patient privacy and that I am speaking with the correct person using two identifiers. I discussed the limitations, risks, security and privacy concerns of performing psychotherapy and management service by telephone and the availability of in person appointments. I also discussed with the patient that there may be a patient responsible charge related to this service. The patient expressed understanding and agreed to proceed. I discussed the treatment planning with the patient. The patient was provided an opportunity to ask questions and all were answered. The patient agreed with the plan and demonstrated an understanding of the instructions. The patient was advised to call  our office if  symptoms worsen or feel they are in a crisis state and need immediate contact.   Therapist Location: home Patient Location: home    Treatment Type: Individual Therapy  Reported Symptoms: anxiety, irritability, triggered responses,crying spells, anger  Mental Status Exam:  Appearance:   Casual and Neat     Behavior:  Appropriate  Motor:  Normal  Speech/Language:   Normal Rate  Affect:  Appropriate  Mood:  anxious  Thought process:  normal  Thought content:    WNL  Sensory/Perceptual disturbances:    WNL  Orientation:  oriented to person, place, time/date and situation  Attention:  Good  Concentration:  Good  Memory:  WNL  Fund of knowledge:   Good  Insight:    Good  Judgment:   Good  Impulse Control:  Good   Risk Assessment: Danger to Self:  No Self-injurious Behavior: No Danger to Others: No Duty to Warn:no Physical Aggression / Violence:No  Access to Firearms  a concern: No  Gang Involvement:No   Subjective: met with patient via virtual session.  She reported she has been having lots of issues with irritability and anger recently.  Patient shared that she feels the EMDR is working but is leaving her with a lot of agitation.  She went on to share that her dad is currently sick and it is triggered lots of panic and anxiety for her.  Did EMDR set on dad having a cold, suds level 8, negative cognition "I am going to be alone" felt anxiety and fear in her shoulders and chest.  Patient was able to reduce suds level to 3.  Patient was able to realize that she has a long history with her parents of feeling that something bad is going to happen to them and she would be alone.  Patient had memories from childhood surface from when they would have to take her mother to the psychiatric inpatient ward at different hospitals.  She was encouraged to let the younger parts of her know that her mother is okay and she is not alone anymore.  Patient was encouraged to work on that over the next few weeks and to journal when she can just to see where her neck session may be started.  Interventions: Solution-Oriented/Positive Psychology and Eye Movement Desensitization and Reprocessing (EMDR)  Diagnosis:   ICD-10-CM   1. Posttraumatic stress disorder  F43.10     Plan: Patient is to use CBT and coping skills to decrease triggered responses.  Patient is to journal as needed.  Patient will continue trauma  work at next session. Long-term goal: Interacting normally with friends and family without irrational fears or intrusive thoughts that control behavior.  Display a full range of emotions without experiencing loss of control Short-term goal: Identify and replace negative self talk and catastrophic sizing that is associated with past trauma and current stimuli triggers for anxiety  Lina Sayre, [facility]

## 2020-07-28 ENCOUNTER — Other Ambulatory Visit: Payer: Self-pay

## 2020-07-28 ENCOUNTER — Ambulatory Visit (INDEPENDENT_AMBULATORY_CARE_PROVIDER_SITE_OTHER): Payer: PRIVATE HEALTH INSURANCE | Admitting: Psychiatry

## 2020-07-28 DIAGNOSIS — F4310 Post-traumatic stress disorder, unspecified: Secondary | ICD-10-CM

## 2020-07-28 NOTE — Progress Notes (Signed)
      [facility] Counselor/Therapist Progress Note  Patient ID: Peggy Ruiz, MRN: [id number],    Date: 07/28/2020  Time Spent: 50 minutes start time 2:12 PM end time 3:02 PM  Treatment Type: Individual Therapy  Reported Symptoms: anxiety, sadness, triggered responses, break downs, intrusive thoughts  Mental Status Exam:  Appearance:   Well Groomed     Behavior:  Appropriate  Motor:  Normal  Speech/Language:   Normal Rate  Affect:  Appropriate  Mood:  anxious  Thought process:  normal  Thought content:    WNL  Sensory/Perceptual disturbances:    WNL  Orientation:  oriented to person, place, time/date and situation  Attention:  Good  Concentration:  Good  Memory:  WNL  Fund of knowledge:   Good  Insight:    Good  Judgment:   Good  Impulse Control:  Good   Risk Assessment: Danger to Self:  No Self-injurious Behavior: No Danger to Others: No Duty to Warn:no Physical Aggression / Violence:No  Access to Firearms a concern: No  Gang Involvement:No   Subjective: Patient was present for session.  She shared that it has been a hard 2 weeks.  She shared that she had 2 break downs.  She reported that her husband is having to work overtime all the time due to a coworker quitting.  Patient also reported she gets very triggered by him not coming home when she expects him to.  Patient was encouraged to think through what seems to be triggering her meltdowns.  She was able to identify being in the apartment that is the catalyst did EMDR set on that.  Suds level 10, negative cognition "I am trapped" felt sadness and hopelessness and fatigue in her shoulders neck and jaw.  Patient was able to reduce suds level to 3.  She was able to identify her parents yelling and leaving when she was a little girl as what was getting triggered through the arguments that she and her husband have been having.  She also was able to realize that she has moved 17 times in her life and that is also very difficult  for her so probably the idea of moving is bringing up a lot of agitation.  Patient was encouraged to communicate all this with her husband and to discussed different ways that he can reassure her and how he can let her know when he is going to be at home in a more realistic possibility with the situation.  Interventions: Solution-Oriented/Positive Psychology and Eye Movement Desensitization and Reprocessing (EMDR)  Diagnosis:   ICD-10-CM   1. Posttraumatic stress disorder  F43.10     Plan: Patient is to use CBT and coping skills to decrease triggered responses.  Is to talk to husband about what surfaced in session and discussed ways to manage their communication differently.  Patient is to try and keep her brain engaged in activity especially when is getting close to time that he is supposed to be returning him. Long-term goal: Interact normally with friends and family without irrational fears or intrusive thoughts that control behavior.  Display a full range of emotions without experiencing loss of control Short-term goal: Identify and replace negative self talk and catastrophic sizing that is associated with past trauma and current stimulus triggers for anxiety  Stevphen Meuse, [facility]

## 2020-08-11 ENCOUNTER — Ambulatory Visit (INDEPENDENT_AMBULATORY_CARE_PROVIDER_SITE_OTHER): Payer: PRIVATE HEALTH INSURANCE | Admitting: Psychiatry

## 2020-08-11 ENCOUNTER — Other Ambulatory Visit: Payer: Self-pay

## 2020-08-11 ENCOUNTER — Encounter: Payer: Self-pay | Admitting: Psychiatry

## 2020-08-11 DIAGNOSIS — F4310 Post-traumatic stress disorder, unspecified: Secondary | ICD-10-CM | POA: Diagnosis not present
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
## 2020-08-25 ENCOUNTER — Ambulatory Visit (INDEPENDENT_AMBULATORY_CARE_PROVIDER_SITE_OTHER): Payer: PRIVATE HEALTH INSURANCE | Admitting: Psychiatry

## 2020-08-25 ENCOUNTER — Telehealth: Payer: Self-pay | Admitting: Psychiatry

## 2020-08-25 ENCOUNTER — Other Ambulatory Visit: Payer: Self-pay

## 2020-08-25 DIAGNOSIS — F4310 Post-traumatic stress disorder, unspecified: Secondary | ICD-10-CM

## 2020-08-25 DIAGNOSIS — F411 Generalized anxiety disorder: Secondary | ICD-10-CM

## 2020-08-25 MED ORDER — CLONAZEPAM 0.5 MG PO TABS: ORAL_TABLET | ORAL | 2 refills | Status: DC

## 2020-08-25 NOTE — Telephone Encounter (Signed)
Script sent  

## 2020-08-25 NOTE — Telephone Encounter (Signed)
Pt was in the office today and needs a refill on her .5 mg of her klonopin to be sent to the [facility] in [place]
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
## 2020-08-31 ENCOUNTER — Other Ambulatory Visit: Payer: Self-pay

## 2020-09-02 ENCOUNTER — Ambulatory Visit (INDEPENDENT_AMBULATORY_CARE_PROVIDER_SITE_OTHER): Payer: PRIVATE HEALTH INSURANCE | Admitting: Psychiatry

## 2020-09-02 ENCOUNTER — Other Ambulatory Visit: Payer: Self-pay

## 2020-09-02 ENCOUNTER — Encounter: Payer: Self-pay | Admitting: Psychiatry

## 2020-09-02 DIAGNOSIS — F39 Unspecified mood [affective] disorder: Secondary | ICD-10-CM | POA: Diagnosis not present

## 2020-09-02 DIAGNOSIS — F4310 Post-traumatic stress disorder, unspecified: Secondary | ICD-10-CM

## 2020-09-02 DIAGNOSIS — F411 Generalized anxiety disorder: Secondary | ICD-10-CM

## 2020-09-02 MED ORDER — CLONAZEPAM 1 MG PO TABS: 1.0000 mg | ORAL_TABLET | Freq: Every day | ORAL | 0 refills | Status: DC

## 2020-09-02 MED ORDER — LURASIDONE HCL 80 MG PO TABS: 80.0000 mg | ORAL_TABLET | Freq: Every day | ORAL | 2 refills | Status: DC

## 2020-09-02 NOTE — Progress Notes (Signed)
   09/02/20 1426  Facial and Oral Movements  Muscles of Facial Expression 0  Lips and Perioral Area 0  Jaw 0  Tongue 0  Extremity Movements  Upper (arms, wrists, hands, fingers) 0  Lower (legs, knees, ankles, toes) 0  Trunk Movements  Neck, shoulders, hips 0  Overall Severity  Severity of abnormal movements (highest score from questions above) 0  Incapacitation due to abnormal movements 0  Patient's awareness of abnormal movements (rate only patient's report) 0  AIMS Total Score  AIMS Total Score 0

## 2020-09-02 NOTE — Progress Notes (Signed)
Peggy Ruiz [id number] 14-Jun-1986 35 y.o.  Subjective:   Patient ID:  Peggy Ruiz is a 35 y.o. (DOB 01-Jan-1986) female.  Chief Complaint:  Chief Complaint  Patient presents with  . Follow-up    Anxiety, depression, and insomnia    HPI RAYME BUI presents to the office today for follow-up of anxiety, depression, and insomnia. She reports that they have an estimated closing date of 09/23/20 and will move 09/26/20. They have been rushing to pack up and move. She reports that interest rates have increased since they started the process and mortgage payment will be higher than what they expected and she experienced panic in response to this. She reports that she had panic throughout the day after learning this information. She reports that she has some panic in response to unexpected changes in plans.   She reports that she has been "super anxious, irritated really easily." She reports mood lability. She reports that at times she will react strongly to something and other times she will be calm in response to similar things.  She reports that there have been weeks when she has not been able to sleep well and weeks that she is sleeping without difficulty. She reports that it can take 1.5 hours to fall asleep. She reports that she usually is able to stay asleep once she falls asleep. She awakens with her husband at 6:30 am and will lay back down after husband leaves for work. She reports that she has been very fatigued since COVID. She reports that she is interested and motivated to do things. She reports that she has had some periods of sadness in response to different losses and upcoming changes. Denies persistent sad mood. She reports that her concentration has been "ok, the same." Denies SI.   She reports decreased appetite and early satiety. She reports wt gain and is eating less.    She reports that some afternoons Klonopin seems to be effective and other times it has not been effective at  all.   She reports that she has a 6 year gap in her memory about childhood. She will journal 1-2 times a week. She reports that she will also sit and reflect about things.   Past Psychiatric Medication Trials: Cymbalta Prozac Lexapro Paxil- Initially helped and then did not seem to be as effective when used without Zyprexa Effexor Nortriptyline-prescribed by GI for gastroparesis Remeron-caused insomnia Buspar- Adverse reaction Abilify-involuntary movements Zyprexa-effective for a period of time. Caused excessive weight gain at times. Seroquel Latuda-effective. Sleeping "harder" on 80 mg. Lamictal Gabapentin- Cannot tolerate doses higher than 800 mg BID Trileptal- Reports that she was unable to tolerate 900 mg QHS Belsomra-helpful with sleep quality but not sleep quantity. Klonopin Ativan Librium Clonidine- Effective Propranolol Trazodone Ambien Nuvigil Adderall Deplin  AIMS   Flowsheet Row Office Visit from 09/02/2020 in [facility] Office Visit from 02/21/2020 in [facility] Office Visit from 02/25/2019 in [facility]  AIMS Total Score 0 0 0       Review of Systems:  Review of Systems  Gastrointestinal: Positive for abdominal distention, abdominal pain, constipation and nausea.  Genitourinary: Positive for dyspareunia and urgency.       Vaginal dryness. Reports painful menstrual periods. She reports that obgyn providers have suspected endometriosis  Musculoskeletal: Negative for gait problem.  Neurological: Negative for tremors.  Psychiatric/Behavioral:       Please refer to HPI   She reports that she had pelvic floor therapy  in the past. She reports that she has been dx'd for pelvic floor dysfunction and mild prolapse. Saw someone for pelvic floor therapy for 4 months.   Medications: I have reviewed the patient's current medications.  Current Outpatient Medications  Medication Sig Dispense Refill  . clonazePAM  (KLONOPIN) 0.5 MG tablet Take 1 tablet in the morning and 1 tablet mid-day. 60 tablet 2  . [START ON 09/26/2020] clonazePAM (KLONOPIN) 1 MG tablet Take 1 tablet (1 mg total) by mouth at bedtime. 30 tablet 0  . cloNIDine (CATAPRES) 0.1 MG tablet Take 1 tab po QHS and 1 qd prn 180 tablet 0  . dexlansoprazole (DEXILANT) 60 MG capsule Take 60 mg by mouth every morning.    . famotidine (PEPCID) 20 MG tablet Take 20 mg by mouth 2 (two) times daily.     Marland Kitchen. gabapentin (NEURONTIN) 800 MG tablet Take 800 mg by mouth 2 (two) times daily.    Marland Kitchen. levonorgestrel-ethinyl estradiol (SEASONALE,INTROVALE,JOLESSA) 0.15-0.03 MG tablet Take 1 tablet by mouth daily.    Marland Kitchen. levothyroxine (SYNTHROID, LEVOTHROID) 50 MCG tablet Take 50 mcg by mouth daily.    Marland Kitchen. Lifitegrast (XIIDRA) 5 % SOLN Apply to eye.    . lurasidone (LATUDA) 80 MG TABS tablet Take 1 tablet (80 mg total) by mouth daily with supper. 30 tablet 2  . magnesium 30 MG tablet Take 30 mg by mouth 2 (two) times daily.    . Multiple Vitamin (MULTIVITAMIN) tablet Take 1 tablet by mouth daily.    . nortriptyline (PAMELOR) 10 MG capsule Take 1 capsule (10 mg total) by mouth at bedtime. 90 capsule 0  . ondansetron (ZOFRAN-ODT) 8 MG disintegrating tablet Take 8 mg by mouth every 8 (eight) hours as needed for nausea.    . Probiotic Product (PROBIOTIC DAILY PO) Take by mouth.     No current facility-administered medications for this visit.    Medication Side Effects: None  Allergies:  Allergies  Allergen Reactions  . Promethazine     Other reaction(s): Agitation  . Ciprofloxacin Other (See Comments)    c-diff  . Dilaudid [Hydromorphone]     sob  . Epinephrine   . Penicillins Hives    Has patient had a PCN reaction causing immediate rash, facial/tongue/throat swelling, SOB or lightheadedness with hypotension: Yes Has patient had a PCN reaction causing severe rash involving mucus membranes or skin necrosis: No Has patient had a PCN reaction that required  hospitalization: No Has patient had a PCN reaction occurring within the last 10 years: Yes If all of the above answers are "NO", then may proceed with Cephalosporin use.   . Toradol [Ketorolac Tromethamine] Nausea And Vomiting    Past Medical History:  Diagnosis Date  . Anorexia    at 15  . Anxiety   . Attention deficit disorder (ADD)   . Bone spur   . Depression   . Eczema   . HPV (human papilloma virus) infection     Family History  Problem Relation Age of Onset  . Fibromyalgia Mother   . Anxiety disorder Mother   . Depression Mother   . Depression Father   . Anxiety disorder Father     Social History   Socioeconomic History  . Marital status: Single    Spouse name: Not on file  . Number of children: Not on file  . Years of education: Not on file  . Highest education level: Not on file  Occupational History  . Not on file  Tobacco Use  .  Smoking status: Never Smoker  . Smokeless tobacco: Never Used  Vaping Use  . Vaping Use: Never used  Substance and Sexual Activity  . Alcohol use: Yes    Comment: occasion  . Drug use: No  . Sexual activity: Never  Other Topics Concern  . Not on file  Social History Narrative   Regular exercise: none   Caffeine use: daily   Social Determinants of Health   Financial Resource Strain: Not on file  Food Insecurity: Not on file  Transportation Needs: Not on file  Physical Activity: Not on file  Stress: Not on file  Social Connections: Not on file  Intimate Partner Violence: Not on file    Past Medical History, Surgical history, Social history, and Family history were reviewed and updated as appropriate.   Please see review of systems for further details on the patient's review from today.   Objective:   Physical Exam:  Wt 148 lb (67.1 kg)   BMI 21.86 kg/m   Physical Exam Constitutional:      General: She is not in acute distress. Musculoskeletal:        General: No deformity.  Neurological:     Mental  Status: She is alert and oriented to person, place, and time.     Coordination: Coordination normal.  Psychiatric:        Attention and Perception: Attention and perception normal. She does not perceive auditory or visual hallucinations.        Mood and Affect: Mood is anxious. Mood is not depressed. Affect is not labile, blunt, angry or inappropriate.        Speech: Speech normal.        Behavior: Behavior normal.        Thought Content: Thought content normal. Thought content is not paranoid or delusional. Thought content does not include homicidal or suicidal ideation. Thought content does not include homicidal or suicidal plan.        Cognition and Memory: Cognition and memory normal.        Judgment: Judgment normal.     Comments: Insight intact     Lab Review:     Component Value Date/Time   NA 139 06/26/2017 0025   NA 138 08/31/2011 1624   K 3.7 06/26/2017 0025   K 4.1 08/31/2011 1624   CL 105 06/26/2017 0025   CL 102 08/31/2011 1624   CO2 25 06/26/2017 0025   CO2 27 08/31/2011 1624   GLUCOSE 110 (H) 06/26/2017 0025   GLUCOSE 81 08/31/2011 1624   BUN 9 06/26/2017 0025   BUN 10 08/31/2011 1624   CREATININE 0.88 06/26/2017 0025   CREATININE 0.88 08/31/2011 1624   CALCIUM 9.1 06/26/2017 0025   CALCIUM 9.1 08/31/2011 1624   PROT 7.4 06/26/2017 0025   PROT 7.8 08/31/2011 1624   ALBUMIN 4.5 06/26/2017 0025   ALBUMIN 4.0 08/31/2011 1624   AST 19 06/26/2017 0025   AST 25 08/31/2011 1624   ALT 14 06/26/2017 0025   ALT 24 08/31/2011 1624   ALKPHOS 49 06/26/2017 0025   ALKPHOS 52 08/31/2011 1624   BILITOT 0.5 06/26/2017 0025   BILITOT 0.3 08/31/2011 1624   GFRNONAA >60 06/26/2017 0025   GFRNONAA >60 08/31/2011 1624   GFRAA >60 06/26/2017 0025   GFRAA >60 08/31/2011 1624       Component Value Date/Time   WBC 6.4 06/26/2017 0025   RBC 3.85 06/26/2017 0025   HGB 12.1 06/26/2017 0025   HGB 12.7 08/31/2011 1624  HCT 34.8 (L) 06/26/2017 0025   HCT 37.7 08/31/2011 1624    PLT 246 06/26/2017 0025   PLT 248 08/31/2011 1624   MCV 90.3 06/26/2017 0025   MCV 90 08/31/2011 1624   MCH 31.5 06/26/2017 0025   MCHC 34.9 06/26/2017 0025   RDW 11.9 06/26/2017 0025   RDW 12.6 08/31/2011 1624   LYMPHSABS 2.2 04/07/2017 1145   MONOABS 0.4 04/07/2017 1145   EOSABS 0.1 04/07/2017 1145   BASOSABS 0.0 04/07/2017 1145    No results found for: POCLITH, LITHIUM   No results found for: PHENYTOIN, PHENOBARB, VALPROATE, CBMZ   .res Assessment: Plan:   Pt reports that she would prefer not to change medications at this time since she is preparing to move. Discussed option of being able to take an additional Klonopin 0.5 mg throughout the day, however pt declines since she reports that she has tried to reduce klonopin and is concerned that an increase in dose could lead to increased tolerance. She reports that she will contact office if anxiety worsens during the moving process and needs to increase qty of klonopin 0.5 mg temporarily.  Will continue current medications without changes.  Recommend continuing to see Stevphen Meuse, [facility].  Pt to follow-up with this provider in 2 months or sooner if clinically indicated.  Patient advised to contact office with any questions, adverse effects, or acute worsening in signs and symptoms.  Darcella was seen today for follow-up.  Diagnoses and all orders for this visit:  PTSD (post-traumatic stress disorder) -     clonazePAM (KLONOPIN) 1 MG tablet; Take 1 tablet (1 mg total) by mouth at bedtime.  Generalized anxiety disorder -     clonazePAM (KLONOPIN) 1 MG tablet; Take 1 tablet (1 mg total) by mouth at bedtime.  Mild mood disorder (HCC) -     lurasidone (LATUDA) 80 MG TABS tablet; Take 1 tablet (80 mg total) by mouth daily with supper.     Please see After Visit Summary for patient specific instructions.  Future Appointments  Date Time Provider Department Center  09/08/2020  2:00 PM Stevphen Meuse, [facility] CP-CP None  09/22/2020   2:00 PM Stevphen Meuse, [facility] CP-CP None  10/06/2020  2:00 PM Stevphen Meuse, [facility] CP-CP None  10/13/2020  2:00 PM Stevphen Meuse, [facility] CP-CP None  10/20/2020  2:00 PM Stevphen Meuse, [facility] CP-CP None  11/12/2020  2:00 PM Corie Chiquito, PMHNP CP-CP None    No orders of the defined types were placed in this encounter.   -------------------------------
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
## 2020-09-08 ENCOUNTER — Ambulatory Visit (INDEPENDENT_AMBULATORY_CARE_PROVIDER_SITE_OTHER): Payer: PRIVATE HEALTH INSURANCE | Admitting: Psychiatry

## 2020-09-08 ENCOUNTER — Other Ambulatory Visit: Payer: Self-pay

## 2020-09-08 DIAGNOSIS — F4310 Post-traumatic stress disorder, unspecified: Secondary | ICD-10-CM | POA: Diagnosis not present
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
## 2020-09-17 ENCOUNTER — Other Ambulatory Visit: Payer: Self-pay

## 2020-09-17 ENCOUNTER — Ambulatory Visit (INDEPENDENT_AMBULATORY_CARE_PROVIDER_SITE_OTHER): Payer: PRIVATE HEALTH INSURANCE | Admitting: Psychiatry

## 2020-09-17 DIAGNOSIS — F4310 Post-traumatic stress disorder, unspecified: Secondary | ICD-10-CM
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
## 2020-09-21 ENCOUNTER — Ambulatory Visit (INDEPENDENT_AMBULATORY_CARE_PROVIDER_SITE_OTHER): Payer: PRIVATE HEALTH INSURANCE | Admitting: Psychiatry

## 2020-09-21 ENCOUNTER — Other Ambulatory Visit: Payer: Self-pay

## 2020-09-21 DIAGNOSIS — F4310 Post-traumatic stress disorder, unspecified: Secondary | ICD-10-CM | POA: Diagnosis not present
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
## 2020-09-22 ENCOUNTER — Ambulatory Visit: Payer: PRIVATE HEALTH INSURANCE | Admitting: Psychiatry

## 2020-10-06 ENCOUNTER — Ambulatory Visit (INDEPENDENT_AMBULATORY_CARE_PROVIDER_SITE_OTHER): Payer: PRIVATE HEALTH INSURANCE | Admitting: Psychiatry

## 2020-10-06 ENCOUNTER — Other Ambulatory Visit: Payer: Self-pay

## 2020-10-06 DIAGNOSIS — F4310 Post-traumatic stress disorder, unspecified: Secondary | ICD-10-CM
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
## 2020-10-13 ENCOUNTER — Ambulatory Visit: Payer: PRIVATE HEALTH INSURANCE | Admitting: Psychiatry

## 2020-10-20 ENCOUNTER — Ambulatory Visit (INDEPENDENT_AMBULATORY_CARE_PROVIDER_SITE_OTHER): Payer: PRIVATE HEALTH INSURANCE | Admitting: Psychiatry

## 2020-10-20 ENCOUNTER — Other Ambulatory Visit: Payer: Self-pay

## 2020-10-20 DIAGNOSIS — F4310 Post-traumatic stress disorder, unspecified: Secondary | ICD-10-CM

## 2020-10-20 NOTE — Progress Notes (Signed)
[facility] Counselor/Therapist Progress Note  Patient ID: Peggy Ruiz, MRN: [id number],    Date: 10/20/2020  Time Spent: 52 minutes start time 2:03 PM end time 2:55 PM  Treatment Type: Individual Therapy  Reported Symptoms: anxiety, triggered responses, panic, overwhelmed, pain, sleep issues, panic attack  Mental Status Exam:  Appearance:   Well Groomed     Behavior:  Appropriate  Motor:  Normal  Speech/Language:   Normal Rate  Affect:  Appropriate  Mood:  normal  Thought process:  normal  Thought content:    WNL  Sensory/Perceptual disturbances:    WNL  Orientation:  oriented to person, place, time/date and situation  Attention:  Good  Concentration:  Good  Memory:  WNL  Fund of knowledge:   Good  Insight:    Good  Judgment:   Good  Impulse Control:  Good   Risk Assessment: Danger to Self:  No Self-injurious Behavior: No Danger to Others: No Duty to Warn:no Physical Aggression / Violence:No  Access to Firearms a concern: No  Gang Involvement:No   Subjective: Patient was present for session.  She shared that they are closing on her house in a few days and she is working hard to get things ready to move.  She stated it is very overwhelming and stressful. She shared she isn't sleeping and in pain. Patient is considering moving up her appointment with Corie Chiquito PMHNP due to sleep issues.  She shared she got triggered last week due to her husband getting more anxious and depressed.  Patient explained that her husband being freaked out about work put her into a panic. She couldn't get in touch with him for 1 of the updates and she just panicked.  Patient did EMDR set on not being able to get in touch with [place], suds level 10, negative cognition "I am alone" felt panic in her chest shoulders and stomach.  Patient was able to reduce suds level to 4.  She was able to remember multiple situations where her parents had told her that they would be okay and would not try  and harm themselves and then they did it or something would happen and they would not be okay.  Patient was encouraged to remind herself that in the end things do work out and she has to know her parents and Selena Batten are going to be okay because they have been okay through all the different situations.  Patient was encouraged to try and breathe and remind herself of the times that things have worked out better than she could have imagined anytime she has the situations where she can get in touch with the people she cares about.  Interventions: Solution-Oriented/Positive Psychology and Eye Movement Desensitization and Reprocessing (EMDR)  Diagnosis:   ICD-10-CM   1. Posttraumatic stress disorder  F43.10     Plan: Patient is to use CBT and coping skills to decrease triggered responses.  Patient is to work on reminding herself of the different times things have worked out when she cannot find the people she cares about and the panic sets in.  Patient is to continue taking medication as directed.  Patient is to prepare for her new home. Long-term goal: Interact normally with friends and family without irrational fears or intrusive thoughts that control behavior.  Display a full range of emotions without experiencing loss of control Short-term goal: Identify and replace negative self talk and catastrophic sizing that is associated with past trauma and current stimuli  triggers for anxiety  Stevphen Meuse, [facility]

## 2020-10-26 ENCOUNTER — Other Ambulatory Visit: Payer: Self-pay

## 2020-10-26 ENCOUNTER — Telehealth: Payer: Self-pay | Admitting: Psychiatry

## 2020-10-26 DIAGNOSIS — F411 Generalized anxiety disorder: Secondary | ICD-10-CM

## 2020-10-26 DIAGNOSIS — F4310 Post-traumatic stress disorder, unspecified: Secondary | ICD-10-CM

## 2020-10-26 MED ORDER — CLONAZEPAM 1 MG PO TABS: 1.0000 mg | ORAL_TABLET | Freq: Every day | ORAL | 0 refills | Status: DC

## 2020-10-26 MED ORDER — CLONAZEPAM 0.5 MG PO TABS: ORAL_TABLET | ORAL | 2 refills | Status: DC

## 2020-10-26 NOTE — Telephone Encounter (Signed)
Pended for review and I updated pharmacy but please double check that it did not default to the other one.

## 2020-10-26 NOTE — Telephone Encounter (Signed)
Elenora called in with request for refill for Klonopin 0.5mg  and Klonopin 1mg . She states she moved over the weekend and would like her prescription sent to new pharmacy. Appt 5/19 Pharmacy [facility] [address] [place]

## 2020-10-26 NOTE — Telephone Encounter (Signed)
It looks like it went to the new one in [place].

## 2020-10-28 ENCOUNTER — Ambulatory Visit (INDEPENDENT_AMBULATORY_CARE_PROVIDER_SITE_OTHER): Payer: PRIVATE HEALTH INSURANCE | Admitting: Psychiatry

## 2020-10-28 DIAGNOSIS — F4310 Post-traumatic stress disorder, unspecified: Secondary | ICD-10-CM
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
## 2020-11-12 ENCOUNTER — Ambulatory Visit (INDEPENDENT_AMBULATORY_CARE_PROVIDER_SITE_OTHER): Payer: PRIVATE HEALTH INSURANCE | Admitting: Psychiatry

## 2020-11-12 ENCOUNTER — Other Ambulatory Visit: Payer: Self-pay

## 2020-11-12 ENCOUNTER — Encounter: Payer: Self-pay | Admitting: Psychiatry

## 2020-11-12 VITALS — BP 111/71 | HR 82 | Wt 146.0 lb

## 2020-11-12 DIAGNOSIS — G4700 Insomnia, unspecified: Secondary | ICD-10-CM

## 2020-11-12 DIAGNOSIS — F4310 Post-traumatic stress disorder, unspecified: Secondary | ICD-10-CM

## 2020-11-12 DIAGNOSIS — F39 Unspecified mood [affective] disorder: Secondary | ICD-10-CM

## 2020-11-12 DIAGNOSIS — F411 Generalized anxiety disorder: Secondary | ICD-10-CM | POA: Diagnosis not present

## 2020-11-12 MED ORDER — CLONAZEPAM 1 MG PO TABS: 1.0000 mg | ORAL_TABLET | Freq: Every day | ORAL | 2 refills | Status: DC

## 2020-11-12 MED ORDER — LURASIDONE HCL 80 MG PO TABS
ORAL_TABLET | ORAL | 2 refills | Status: DC
Start: 2020-11-12 — End: 2020-12-22

## 2020-11-12 MED ORDER — OLANZAPINE 5 MG PO TABS: ORAL_TABLET | ORAL | 1 refills | Status: DC

## 2020-11-12 NOTE — Progress Notes (Signed)
      [facility] Counselor/Therapist Progress Note  Patient ID: NAKETA DADDARIO, MRN: [id number],    Date: 11/12/2020  Time Spent: 52 minutes start time 1:07 PM and time 1:59 PM  Treatment Type: Individual Therapy  Reported Symptoms: anxiety, frustration, triggered responses, sadness, panic, sleep issues  Mental Status Exam:  Appearance:   Well Groomed     Behavior:  Appropriate  Motor:  Normal  Speech/Language:   Normal Rate  Affect:  Appropriate  Mood:  irritable  Thought process:  circumstantial  Thought content:    WNL  Sensory/Perceptual disturbances:    WNL  Orientation:  oriented to person, place, time/date and situation  Attention:  Good  Concentration:  Good  Memory:  WNL  Fund of knowledge:   Good  Insight:    Good  Judgment:   Good  Impulse Control:  Good   Risk Assessment: Danger to Self:  No Self-injurious Behavior: No Danger to Others: No Duty to Warn:no Physical Aggression / Violence:No  Access to Firearms a concern: No  Gang Involvement:No   Subjective: Patient was present for session.  She shared that she is getting very triggered by things happening with her husband's work.  She shared that she can't handle being alone in the middle of the night. She went on to share that her husband is on sleep medication and can't drive when he takes them.  He finally called and told his boss that he can't drive on the meds. She also shared that his work is having him work more weekends so she will be alone more as well.  Patient went on to share she is concerned because she is going to 0 to 12 so quickly she doesn't feel she can even use the tools discussed in session and talk herself down.  Patient did EMDR set on husband's work, suds level 10, negative cognition "I am alone and I am unsafe" felt panic in her chest.  Patient was able to reduce suds level to 4.  Through the processing patient was able to realize that she is very concerned about her husband's health and has  missed the quality time that they used to have prior to this job.  Patient agreed to discuss what she realized and processing with her husband.  Interventions: Solution-Oriented/Positive Psychology and Eye Movement Desensitization and Reprocessing (EMDR)  Diagnosis:   ICD-10-CM   1. Posttraumatic stress disorder  F43.10     Plan: Patient is to use CBT and coping skills to decrease triggered responses.  Patient is to communicate what surfaced in session with husband.  Patient is to work on her physical health.  Patient is to take medication as directed Long-term goal: Interact normally with friends and family without irrational fears or intrusive thoughts that control behavior. Display a full range of emotions without experiencing loss of control Short-term goal: Identify and replace negative self talk and catastrophic sizing that is associated with past trauma and current stimuli triggers for anxiety   Stevphen Meuse, [facility]

## 2020-11-12 NOTE — Progress Notes (Signed)
Peggy Ruiz [id number] 10-Oct-1985 34 y.o.  Subjective:   Patient ID:  Peggy Ruiz is a 35 y.o. (DOB 1986/04/30) female.  Chief Complaint:  Chief Complaint  Patient presents with  . Anxiety    HPI Peggy Ruiz presents to the office today for follow-up of mood disturbance, anxiety, and insomnia. She reports that they moved in late April and her husband took a week off to move. Recently learned about changes at husband's work, to include him working Saturdays and taking call. She reports that she has anxiety with being alone at night. She has had anxiety and panic with being at home by herself and getting used to new home, new location, and sounds. She reports that panic attacks escalate quickly- "Go from 0 to 10." She reports that panic has returned to previous levels. "Most days I don't feel like I am on 2 mg of Klonopin. I feel like I am not on anything." She reports racing thoughts. Anxious thoughts and rumination.   She reports sad mood, anger, and irritability. She reports, "I'm always yelling... I'm lashing out more." She reports that she has been having uncontrolled crying. Denies any impulsivity or risky behavior. Denies excessive spending. Describes feeling "high emotionally" with nervous energy and then "crash" a few hours later. She reports motivation has been good and is doing the things she needs to do. Eating less with increased anxiety. She reports that her sleep has been disrupted and restless. Reports multiple awakenings during the night. Diminished concentration. Denies SI.   She reports that her weight has been stable since starting on Levothyroxine in 2019. She questions if wt gain in the past was due to thyroid d/o vs. Olanzapine.    Closing date on their house was moved up by several weeks at the last minute. They had to pack and find movers in a hurry.    Past Psychiatric Medication Trials: Cymbalta- Effective for a period of time and then no longer as effective.   Prozac Lexapro Paxil- Initially helped and then did not seem to be as effective when used without Zyprexa Effexor Nortriptyline-prescribed by GI for gastroparesis Remeron-caused insomnia Buspar- Adverse reaction Abilify-involuntary movements Zyprexa-effective and took for about 14 years. Caused excessive weight gain at times. Seroquel Latuda-effective. Sleeping "harder" on 80 mg. Lamictal Gabapentin- Cannot tolerate doses higher than 800 mg BID Trileptal- Reports that she was unable to tolerate 900 mg QHS Belsomra-helpful with sleep quality but not sleep quantity. Klonopin Ativan Librium Clonidine- Effective Propranolol Trazodone Ambien Nuvigil Adderall Deplin   AIMS   Flowsheet Row Office Visit from 09/02/2020 in [facility] Office Visit from 02/21/2020 in [facility] Office Visit from 02/25/2019 in [facility]  AIMS Total Score 0 0 0       Review of Systems:  Review of Systems  Gastrointestinal: Positive for diarrhea.  Musculoskeletal: Negative for gait problem.  Neurological: Positive for headaches.  Psychiatric/Behavioral:       Please refer to HPI    Medications: I have reviewed the patient's current medications.  Current Outpatient Medications  Medication Sig Dispense Refill  . clonazePAM (KLONOPIN) 0.5 MG tablet Take 1 tablet in the morning and 1 tablet mid-day. 60 tablet 2  . cloNIDine (CATAPRES) 0.1 MG tablet Take 1 tab po QHS and 1 qd prn 180 tablet 0  . dexlansoprazole (DEXILANT) 60 MG capsule Take 60 mg by mouth every morning.    . famotidine (PEPCID) 20 MG tablet Take 20 mg by mouth  2 (two) times daily.     Marland Kitchen gabapentin (NEURONTIN) 800 MG tablet Take 800 mg by mouth 2 (two) times daily.    Marland Kitchen levothyroxine (SYNTHROID, LEVOTHROID) 50 MCG tablet Take 50 mcg by mouth daily.    Marland Kitchen Lifitegrast (XIIDRA) 5 % SOLN Apply to eye.    . magnesium 30 MG tablet Take 30 mg by mouth 2 (two) times daily.    . Multiple  Vitamin (MULTIVITAMIN) tablet Take 1 tablet by mouth daily.    . nortriptyline (PAMELOR) 10 MG capsule Take 1 capsule (10 mg total) by mouth at bedtime. 90 capsule 0  . OLANZapine (ZYPREXA) 5 MG tablet Take 1/2 tablet at bedtime for 4 nights, then increase to 1 tablet at bedtime 30 tablet 1  . ondansetron (ZOFRAN-ODT) 8 MG disintegrating tablet Take 8 mg by mouth every 8 (eight) hours as needed for nausea.    . Probiotic Product (PROBIOTIC DAILY PO) Take by mouth.    Melene Muller ON 11/23/2020] clonazePAM (KLONOPIN) 1 MG tablet Take 1 tablet (1 mg total) by mouth at bedtime. 30 tablet 2  . levonorgestrel-ethinyl estradiol (SEASONALE,INTROVALE,JOLESSA) 0.15-0.03 MG tablet Take 1 tablet by mouth daily.    Marland Kitchen lurasidone (LATUDA) 80 MG TABS tablet Decrease to 1/2 tablet for one week, then start one week of 20 mg daily for one week, then stop 30 tablet 2   No current facility-administered medications for this visit.    Medication Side Effects: None  Allergies:  Allergies  Allergen Reactions  . Promethazine     Other reaction(s): Agitation  . Ciprofloxacin Other (See Comments)    c-diff  . Dilaudid [Hydromorphone]     sob  . Epinephrine   . Penicillins Hives    Has patient had a PCN reaction causing immediate rash, facial/tongue/throat swelling, SOB or lightheadedness with hypotension: Yes Has patient had a PCN reaction causing severe rash involving mucus membranes or skin necrosis: No Has patient had a PCN reaction that required hospitalization: No Has patient had a PCN reaction occurring within the last 10 years: Yes If all of the above answers are "NO", then may proceed with Cephalosporin use.   . Toradol [Ketorolac Tromethamine] Nausea And Vomiting    Past Medical History:  Diagnosis Date  . Anorexia    at 15  . Anxiety   . Attention deficit disorder (ADD)   . Bone spur   . Depression   . Eczema   . HPV (human papilloma virus) infection     Past Medical History, Surgical  history, Social history, and Family history were reviewed and updated as appropriate.   Please see review of systems for further details on the patient's review from today.   Objective:   Physical Exam:  BP 111/71   Pulse 82   Wt 146 lb (66.2 kg)   BMI 21.56 kg/m   Physical Exam Constitutional:      General: She is not in acute distress. Musculoskeletal:        General: No deformity.  Neurological:     Mental Status: She is alert and oriented to person, place, and time.     Coordination: Coordination normal.  Psychiatric:        Attention and Perception: Attention and perception normal. She does not perceive auditory or visual hallucinations.        Mood and Affect: Mood is anxious. Affect is not labile, blunt, angry or inappropriate.        Speech: Speech normal.  Behavior: Behavior normal.        Thought Content: Thought content normal. Thought content is not paranoid or delusional. Thought content does not include homicidal or suicidal ideation. Thought content does not include homicidal or suicidal plan.        Cognition and Memory: Cognition and memory normal.        Judgment: Judgment normal.     Comments: Insight intact Racing thoughts Dysphoric mood     Lab Review:     Component Value Date/Time   NA 139 06/26/2017 0025   NA 138 08/31/2011 1624   K 3.7 06/26/2017 0025   K 4.1 08/31/2011 1624   CL 105 06/26/2017 0025   CL 102 08/31/2011 1624   CO2 25 06/26/2017 0025   CO2 27 08/31/2011 1624   GLUCOSE 110 (H) 06/26/2017 0025   GLUCOSE 81 08/31/2011 1624   BUN 9 06/26/2017 0025   BUN 10 08/31/2011 1624   CREATININE 0.88 06/26/2017 0025   CREATININE 0.88 08/31/2011 1624   CALCIUM 9.1 06/26/2017 0025   CALCIUM 9.1 08/31/2011 1624   PROT 7.4 06/26/2017 0025   PROT 7.8 08/31/2011 1624   ALBUMIN 4.5 06/26/2017 0025   ALBUMIN 4.0 08/31/2011 1624   AST 19 06/26/2017 0025   AST 25 08/31/2011 1624   ALT 14 06/26/2017 0025   ALT 24 08/31/2011 1624    ALKPHOS 49 06/26/2017 0025   ALKPHOS 52 08/31/2011 1624   BILITOT 0.5 06/26/2017 0025   BILITOT 0.3 08/31/2011 1624   GFRNONAA >60 06/26/2017 0025   GFRNONAA >60 08/31/2011 1624   GFRAA >60 06/26/2017 0025   GFRAA >60 08/31/2011 1624       Component Value Date/Time   WBC 6.4 06/26/2017 0025   RBC 3.85 06/26/2017 0025   HGB 12.1 06/26/2017 0025   HGB 12.7 08/31/2011 1624   HCT 34.8 (L) 06/26/2017 0025   HCT 37.7 08/31/2011 1624   PLT 246 06/26/2017 0025   PLT 248 08/31/2011 1624   MCV 90.3 06/26/2017 0025   MCV 90 08/31/2011 1624   MCH 31.5 06/26/2017 0025   MCHC 34.9 06/26/2017 0025   RDW 11.9 06/26/2017 0025   RDW 12.6 08/31/2011 1624   LYMPHSABS 2.2 04/07/2017 1145   MONOABS 0.4 04/07/2017 1145   EOSABS 0.1 04/07/2017 1145   BASOSABS 0.0 04/07/2017 1145    No results found for: POCLITH, LITHIUM   No results found for: PHENYTOIN, PHENOBARB, VALPROATE, CBMZ   .res Assessment: Plan:     Patient seen for 30 minutes and time spent counseling patient regarding treatment options for anxiety.  Patient reports that she would like to retry olanzapine since this has been most effective for her anxiety and mood in the past.  She reports that weight gain was attributed to olanzapine in the past, however she questions if weight gain may have been due to thyroid disorder since her weight has been stable since she started thyroid treatment in 2019.  Discussed retrial of olanzapine with close monitoring of weight to determine risks versus benefits.  Also discussed using lowest possible effective dose to potentially minimize risk of side effects.  Will start olanzapine 2.5 mg at bedtime for 4 nights, then increase to 5 mg at bedtime for mood and anxiety. Will decrease Latuda due to start of olanzapine.  Will decrease Latuda to 80 mg 1/2 tablet at bedtime for 1 week, then 20 mg daily for 1 week, then stop. Continue Klonopin 0.5 mg morning and midday for anxiety. Continue Klonopin  1 mg at  bedtime for anxiety and insomnia. Continue clonidine 0.1 mg 1 tablet at bedtime and 1/2 to 1 tablet daily as needed for anxiety. Patient to follow-up in 1 to 2 months or sooner if clinically indicated. Recommend continuing psychotherapy with Stevphen MeuseHolly Ingram, [facility]. Patient advised to contact office with any questions, adverse effects, or acute worsening in signs and symptoms.    Peggy RuizLeah was seen today for anxiety.  Diagnoses and all orders for this visit:  Generalized anxiety disorder -     OLANZapine (ZYPREXA) 5 MG tablet; Take 1/2 tablet at bedtime for 4 nights, then increase to 1 tablet at bedtime -     clonazePAM (KLONOPIN) 1 MG tablet; Take 1 tablet (1 mg total) by mouth at bedtime.  Mild mood disorder (HCC) -     lurasidone (LATUDA) 80 MG TABS tablet; Decrease to 1/2 tablet for one week, then start one week of 20 mg daily for one week, then stop  PTSD (post-traumatic stress disorder) -     clonazePAM (KLONOPIN) 1 MG tablet; Take 1 tablet (1 mg total) by mouth at bedtime.  Insomnia, unspecified type -     OLANZapine (ZYPREXA) 5 MG tablet; Take 1/2 tablet at bedtime for 4 nights, then increase to 1 tablet at bedtime     Please see After Visit Summary for patient specific instructions.  Future Appointments  Date Time Provider Department Center  11/24/2020  2:00 PM Stevphen Meusengram, Holly, [facility] None  12/08/2020  2:00 PM Stevphen Meusengram, Holly[facility] None  12/22/2020 12:30 PM Corie Chiquitoarter, Ryanne Morand, PMHNP CP-CP None  12/22/2020  3:00 PM Stevphen Meusengram, Holly, [facility] None  01/05/2021  3:00 PM Stevphen Meusengram, Holly, [facility] None  01/19/2021  2:00 PM Stevphen Meusengram, Holly, [facility] None  02/02/2021  2:00 PM Stevphen MeuseIngram, Holly, [facility] None    No orders of the defined types were placed in this encounter.   -------------------------------

## 2020-11-16 ENCOUNTER — Telehealth: Payer: Self-pay | Admitting: Psychiatry

## 2020-11-16 NOTE — Telephone Encounter (Signed)
Pt would like to know what she can do about splitting Zyprexa in half,. Pt said that she has ruined several pills trying to cut them in half. Pt wants to know if she can just take 5mg  and not do the 4 days of splitting in half. Please call.

## 2020-11-16 NOTE — Telephone Encounter (Signed)
Ok to move up to one whole 5 mg tab

## 2020-11-16 NOTE — Telephone Encounter (Signed)
Pt given information

## 2020-11-16 NOTE — Telephone Encounter (Signed)
Please advise. Asking to move up to 5 mg Zyprexa

## 2020-11-24 ENCOUNTER — Other Ambulatory Visit: Payer: Self-pay

## 2020-11-24 ENCOUNTER — Ambulatory Visit (INDEPENDENT_AMBULATORY_CARE_PROVIDER_SITE_OTHER): Payer: PRIVATE HEALTH INSURANCE | Admitting: Psychiatry

## 2020-11-24 DIAGNOSIS — F4310 Post-traumatic stress disorder, unspecified: Secondary | ICD-10-CM

## 2020-11-24 NOTE — Progress Notes (Signed)
[facility] Counselor/Therapist Progress Note  Patient ID: Peggy Ruiz, MRN: [id number],    Date: 11/24/2020  Time Spent: 56 minutes start time 2:06 PM end time 3:02 PM  Treatment Type: Individual Therapy  Reported Symptoms: anxiety, panic, melt downs, flashbacks, triggered responses  Mental Status Exam:  Appearance:   Casual and Neat     Behavior:  Appropriate  Motor:  Normal  Speech/Language:   Normal Rate  Affect:  Appropriate  Mood:  anxious  Thought process:  normal  Thought content:    WNL  Sensory/Perceptual disturbances:    WNL  Orientation:  oriented to person, place, time/date and situation  Attention:  Good  Concentration:  Good  Memory:  WNL  Fund of knowledge:   Good  Insight:    Good  Judgment:   Good  Impulse Control:  Good   Risk Assessment: Danger to Self:  No Self-injurious Behavior: No Danger to Others: No Duty to Warn:no Physical Aggression / Violence:No  Access to Firearms a concern: No  Gang Involvement:No   Subjective: Patient was present for session.  She shared she and her husband had a good weekend due to no phone calls form work and that was positive.  She has started there Zyprexa and has already noticed that she is hungrier and thirstier on the medication but she is okay with gaining some weight but knows she won't let herself gain too much. She shared she is trying to be aware of her eating and drink more water than eating. She is having side effects of coming off the [place]..  She shared that a documentary on lyme disease came out and the end they talked about the beauty of it. She has decided to try a holistic and functional doctor. She stated she wants to get rid of the lyme in her body so she can move forward with life. She shared that last Tuesday was a bad day and night.  Her husband went to a neurologist to have lots of testing and she wasn't sure if she wanted him to do that due to the cost. She stated she hasn't been getting a  shot due to the finances.  He got an MRI and didn't talk to her about it and she was upset due to him not talking to her about it. She explained that their communication has been a huge issue and that was what caused the issues.  Had patient do EMDR set on issue with her husband she shared that when he wraps his arms around her is very triggering did EMDR set on that picture suds level 8, negative cognition "I am trapped" felt anxiety in hands and feet.  Patient was able to reduce suds level to 3.  Discussed the importance of her having him wrap his arms around her when she is at a good place so she could do her self talk and remind herself that it is safe and she has a choice on whether or not he hugs her.  Patient agreed to talk to her husband about that and reported feeling that that would be helpful.  Interventions: Solution-Oriented/Positive Psychology and Eye Movement Desensitization and Reprocessing (EMDR)  Diagnosis:   ICD-10-CM   1. Posttraumatic stress disorder  F43.10     Plan: Patient is to use CBT and coping skills to decrease triggered responses.  Patient is to work on having her husband wrap his arms around her and using self talk to work through it.  Patient is to continue working with physicians on her Lyme disease.  Patient is to take medication as directed. Long-term goal: Interact normally with friends and family without irrational fears or intrusive thoughts that control behavior. Display a full range of emotions without experiencing loss of control Short-term goal: Identify and replace negative self talk and catastrophic sizing that is associated with past trauma and current stimuli triggers for anxiety  Stevphen Meuse, [facility]

## 2020-12-08 ENCOUNTER — Ambulatory Visit: Payer: PRIVATE HEALTH INSURANCE | Admitting: Psychiatry

## 2020-12-08 ENCOUNTER — Other Ambulatory Visit: Payer: Self-pay

## 2020-12-08 DIAGNOSIS — F4310 Post-traumatic stress disorder, unspecified: Secondary | ICD-10-CM | POA: Diagnosis not present

## 2020-12-08 NOTE — Progress Notes (Signed)
[facility] Counselor/Therapist Progress Note  Patient ID: Peggy Ruiz, MRN: [id number],    Date: 12/08/2020  Time Spent: 62 minutes start time 1:58 PM end time 3:00 PM  Treatment Type: Individual Therapy  Reported Symptoms: fatigue, anxiety, sadness, pain issues, panic, intrusive thoughts, melt downs, anger  Mental Status Exam:  Appearance:   Well Groomed     Behavior:  Appropriate  Motor:  Normal  Speech/Language:   Normal Rate  Affect:  Appropriate  Mood:  anxious and sad  Thought process:  normal  Thought content:    WNL  Sensory/Perceptual disturbances:    WNL  Orientation:  oriented to person, place, time/date, and situation  Attention:  Good  Concentration:  Good  Memory:  WNL  Fund of knowledge:   Good  Insight:    Good  Judgment:   Good  Impulse Control:  Good   Risk Assessment: Danger to Self:  No Self-injurious Behavior: No Danger to Others: No Duty to Warn:no Physical Aggression / Violence:No  Access to Firearms a concern: No  Gang Involvement:No   Subjective: Patient was present for session.  She shared that she is having fatigue and she is wondering if it is due to medication.  She went on to share that she is trying to figure out what to do concerning what to do with working with a holistic doctor due to finances. She reported she is tired of being sick and tired and she doesn't want this to be the rest of her life so she is considering making the investment.  She shared that she is realizing when things don't go as expected she melt downs. She reported the panic attacks are getting more out of control.  It was affirmed that work from previous sessions on her trauma was still okay and affective because she reported she was not having any memories surface or flashbacks while these incidents were going on.  Also shared that she did not seem to be having nightmares at this time.  Was encouraged to think through her situation.  She was able to realize that  her husband is starting to have more meetings in the evening especially on the evenings that she would typically be meeting with her support group on Tuesdays or Thursdays.  Patient stated she has not been able to go to those groups any longer and that it has been hard.  Also she is not going to church much on Sundays because of his work as well.  Patient also shared that the location of their new home is more isolated and not close to shops that she could just piddle around with and get some socialization or even a coffee shop where she could go and read a book and still have some's social interaction without getting overwhelmed.  Patient explained that she had been able to do all those things when she looked at her apartment and that had given her a great release for her.  Patient also shared she is further away from her parents so she is not able to see them as much or have the contact she is used to having.  Patient stated with the gas prices increasing her anxiety is increasing and she is feeling more trapped and isolated.  Patient is also having her medical issues which are also causing her to feel trapped and alone.  Encouraged patient to try and think of different ways that she can have some social interaction.  Encouraged her  to look at churches in the area that she would feel comfortable going to for Bible study groups or prayer groups to start making some connection.  Also encouraged her to look into support groups that may be in the area that she could attend.  Encouraged patient to start going to her groups again on a Tuesday or Thursday night even if her husband cannot go so she has that social interaction that she is needing.  Patient is to start watching podcast by Dr. De Burrs regularly.  She was asked to go back to working on her exercises from physical therapy to get some form of movement as a physical release for her emotions.  She is also to work on developing a [employer].  Shared in the  past she used to get very involved in books and that helped to ground her.  Encouraged her to think through ways that she can do that as well.  Interventions: Cognitive Behavioral Therapy and Solution-Oriented/Positive Psychology  Diagnosis:   ICD-10-CM   1. Posttraumatic stress disorder  F43.10       Plan: Patient is to use CBT and coping skills to decrease triggered responses.  Patient is to work on trying to find different ways that she can have some social interaction throughout the day -find a support group or church group or someplace that she can go to get out of the house like a coffee shop or a Engineer, materials.  Patient is to work on exercising to release negative emotions appropriately.  Patient is to take medication as directed. Long-term goal: Interact normally with friends and family without irrational fears or intrusive thoughts that control behavior.  Display a full range of emotions without experiencing loss of control Short-term goal: Identify and replace negative self talk and catastrophic sizing that is associated with past trauma and current stimuli triggers for anxiety  Stevphen Meuse, [facility]

## 2020-12-22 ENCOUNTER — Ambulatory Visit: Payer: PRIVATE HEALTH INSURANCE | Admitting: Psychiatry

## 2020-12-22 ENCOUNTER — Other Ambulatory Visit: Payer: Self-pay

## 2020-12-22 ENCOUNTER — Encounter: Payer: Self-pay | Admitting: Psychiatry

## 2020-12-22 DIAGNOSIS — F4310 Post-traumatic stress disorder, unspecified: Secondary | ICD-10-CM

## 2020-12-22 DIAGNOSIS — G4700 Insomnia, unspecified: Secondary | ICD-10-CM | POA: Diagnosis not present

## 2020-12-22 DIAGNOSIS — F411 Generalized anxiety disorder: Secondary | ICD-10-CM

## 2020-12-22 MED ORDER — CLONAZEPAM 0.5 MG PO TABS: ORAL_TABLET | ORAL | 5 refills | Status: DC

## 2020-12-22 MED ORDER — CLONIDINE HCL 0.1 MG PO TABS: ORAL_TABLET | ORAL | 0 refills | Status: DC

## 2020-12-22 MED ORDER — OLANZAPINE 5 MG PO TABS: 5.0000 mg | ORAL_TABLET | Freq: Every day | ORAL | 1 refills | Status: DC

## 2020-12-22 NOTE — Progress Notes (Signed)
      [facility] Counselor/Therapist Progress Note  Patient ID: Peggy Ruiz, MRN: [id number],    Date: 12/22/2020  Time Spent: 58 minutes start time 3:06 PM end time 4:04 p.m.  Treatment Type: Individual Therapy  Reported Symptoms: chronic pain issues, anxiety, panic, triggered responses, anger  Mental Status Exam:  Appearance:   Well Groomed     Behavior:  Appropriate  Motor:  Normal  Speech/Language:   Normal Rate  Affect:  Appropriate  Mood:  anxious and sad  Thought process:  circumstantial  Thought content:    WNL  Sensory/Perceptual disturbances:    pain  Orientation:  oriented to person, place, time/date, and situation  Attention:  Good  Concentration:  Good  Memory:  WNL  Fund of knowledge:   Good  Insight:    Good  Judgment:   Good  Impulse Control:  Good   Risk Assessment: Danger to Self:  No Self-injurious Behavior: No Danger to Others: No Duty to Warn:no Physical Aggression / Violence:No  Access to Firearms a concern: No  Gang Involvement:No   Subjective: Patient was present for session.  She reported she is feeling a decrease in her anxiety and anger.  She went on to share she is handling it better though which is good.  She stated she is realizing she is noticing a pattern of having issues thinking that things aren't fixable when something goes wrong.  Patient did EMDR set on going through eating disorder and cutting which was the first time she recognized things had to be perfect, suds level 6, negative cognition "unfixable" felt anger and anxiety in her chest.  Patient was able to reduce suds level to 3.  Patient was able to recognize that she does have unrealistic expectations of herself and others.  Patient was able to think through ways to work on talking herself through things that were difficult and did not work out the way she anticipated they would.  Patient was able to think of lots of difficult things that have worked out in ways that were much  better than she anticipated.  Patient was encouraged to write down that list to read it when she starts having the doubts and sadness.    Interventions: Solution-Oriented/Positive Psychology and Eye Movement Desensitization and Reprocessing (EMDR)  Diagnosis:   ICD-10-CM   1. Posttraumatic stress disorder  F43.10       Plan: Patient is to use CBT and coping skills to decrease triggered responses.  Patient is to continue working on finding ways to interact with others.  Patient is to take medication as directed.  Patient is to journal to release negative emotions appropriately.  Patient is to write out the things that have worked out in positive ways even though they did not work out the way she thought they would to read through the successes when she is getting triggered. Long-term goal: Interact normally with friends and family without irrational fears or intrusive thoughts that control behavior.  Display a full range of emotions without experiencing loss of control Short-term goal: Identify and replace negative self talk and catastrophic sizing that is associated with past trauma and current stimuli triggers for anxiety  Stevphen Meuse, [facility]

## 2020-12-22 NOTE — Progress Notes (Signed)
Peggy Ruiz [id number] [birth year] 34 y.o.  Subjective:   Patient ID:  Peggy Ruiz is a 35 y.o. (DOB [birth year]) female.  Chief Complaint:  Chief Complaint  Patient presents with   Follow-up    Anxiety, mood disturbance, and insomnia    HPI [name] presents to the office today for follow-up of anxiety, mood disturbance, and insomnia. She reports feeling "a little better." She reports that sleep initiation takes slightly longer on Olanzapine compared to [facility], however she stays asleep and does not easily awaken. No longer waking up nightly to use the bathroom. She reports that that she feels like she is in a deeper sleep. Notices slight "hangover effect." She reports that she continues to experience "anxiety attacks but their intensity is reduced" and they are shorter in duration. "I like that I am able to get calmer quicker." She reports that she is not getting upset about things that would have caused her to feel upset before. She reports that her mood has improved. She reports that she feels less depressed. She reports that mood is less labile. She notices some irritability and is now "able to catch myself." "I feel like my emotions are more under control." Energy and motivation have been ok. She reports some difficulty getting going in the mornings. Concentration has been adequate. She recently read a book in 1.5 weeks. Able to read after not being able to finish a book in years. Denies SI.   Appetite has been increased, especially at night. She noticed some slight weight gain initially and this has plateaued. She reports that she is able to eat more at dinner. She notices hunger in between meals. No longer having early satiety.   She reports that she had some HA's and nausea with reducing Latuda.   Past Psychiatric Medication Trials: Cymbalta- Effective for a period of time and then no longer as effective.  Prozac Lexapro Paxil- Initially helped and then did not seem to be as  effective when used without Zyprexa Effexor Nortriptyline-prescribed by GI for gastroparesis Remeron-caused insomnia Buspar- Adverse reaction Abilify-involuntary movements Zyprexa-effective and took for about 14 years.  Caused excessive weight gain at times. Seroquel Latuda-effective. Sleeping "harder" on 80 mg. Lamictal Gabapentin- Cannot tolerate doses higher than 800 mg BID Trileptal- Reports that she was unable to tolerate 900 mg QHS Belsomra-helpful with sleep quality but not sleep quantity. Klonopin Ativan Librium Clonidine- Effective Propranolol Trazodone Ambien Nuvigil Adderall Deplin    AIMS    Flowsheet Row Office Visit from 12/22/2020 in [facility] Office Visit from 09/02/2020 in [facility] Office Visit from 02/21/2020 in [facility] Office Visit from 02/25/2019 in [facility]  AIMS Total Score 0 0 0 0        Review of Systems:  Review of Systems  Gastrointestinal:  Positive for constipation.  Musculoskeletal:  Positive for back pain. Negative for gait problem.  Psychiatric/Behavioral:         Please refer to HPI   Medications: I have reviewed the patient's current medications.  Current Outpatient Medications  Medication Sig Dispense Refill   clonazePAM (KLONOPIN) 1 MG tablet Take 1 tablet (1 mg total) by mouth at bedtime. 30 tablet 2   dexlansoprazole (DEXILANT) 60 MG capsule Take 60 mg by mouth every morning.     famotidine (PEPCID) 20 MG tablet Take 20 mg by mouth 2 (two) times daily.      gabapentin (NEURONTIN) 800 MG tablet Take 800 mg by mouth  2 (two) times daily.     levothyroxine (SYNTHROID, LEVOTHROID) 50 MCG tablet Take 50 mcg by mouth daily.     Lifitegrast (XIIDRA) 5 % SOLN Apply to eye.     magnesium 30 MG tablet Take 30 mg by mouth 2 (two) times daily.     Multiple Vitamin (MULTIVITAMIN) tablet Take 1 tablet by mouth daily.     nortriptyline (PAMELOR) 10 MG capsule Take 1  capsule (10 mg total) by mouth at bedtime. 90 capsule 0   ondansetron (ZOFRAN-ODT) 8 MG disintegrating tablet Take 8 mg by mouth every 8 (eight) hours as needed for nausea.     Probiotic Product (PROBIOTIC DAILY PO) Take by mouth.     [START ON 01/21/2021] clonazePAM (KLONOPIN) 0.5 MG tablet Take 1 tablet in the morning and 1 tablet mid-day. 60 tablet 5   cloNIDine (CATAPRES) 0.1 MG tablet Take 1 tab po QHS and 1 qd prn 180 tablet 0   levonorgestrel-ethinyl estradiol (SEASONALE,INTROVALE,JOLESSA) 0.15-0.03 MG tablet Take 1 tablet by mouth daily.     morphine (MSIR) 15 MG tablet morphine 15 mg immediate release tablet  Take 1 tablet 3 times a day by oral route as needed.     OLANZapine (ZYPREXA) 5 MG tablet Take 1 tablet (5 mg total) by mouth at bedtime. 90 tablet 1   No current facility-administered medications for this visit.    Medication Side Effects: Other: Constipation, grogginess upon awakening  Allergies:  Allergies  Allergen Reactions   Promethazine     Other reaction(s): Agitation   Ciprofloxacin Other (See Comments)    c-diff   Dilaudid [Hydromorphone]     sob   Epinephrine    Penicillins Hives    Has patient had a PCN reaction causing immediate rash, facial/tongue/throat swelling, SOB or lightheadedness with hypotension: Yes Has patient had a PCN reaction causing severe rash involving mucus membranes or skin necrosis: No Has patient had a PCN reaction that required hospitalization: No Has patient had a PCN reaction occurring within the last 10 years: Yes If all of the above answers are "NO", then may proceed with Cephalosporin use.    Toradol [Ketorolac Tromethamine] Nausea And Vomiting    Past Medical History:  Diagnosis Date   Anorexia    at 77   Anxiety    Attention deficit disorder (ADD)    Bone spur    Depression    Eczema    HPV (human papilloma virus) infection     Past Medical History, Surgical history, Social history, and Family history were reviewed  and updated as appropriate.   Please see review of systems for further details on the patient's review from today.   Objective:   Physical Exam:  Wt 146 lb (66.2 kg)   BMI 21.56 kg/m   Physical Exam Constitutional:      General: She is not in acute distress. Musculoskeletal:        General: No deformity.  Neurological:     Mental Status: She is alert and oriented to person, place, and time.     Coordination: Coordination normal.  Psychiatric:        Attention and Perception: Attention and perception normal. She does not perceive auditory or visual hallucinations.        Mood and Affect: Affect is not labile, blunt, angry or inappropriate.        Speech: Speech normal.        Behavior: Behavior normal.        Thought Content:  Thought content normal. Thought content is not paranoid or delusional. Thought content does not include homicidal or suicidal ideation. Thought content does not include homicidal or suicidal plan.        Cognition and Memory: Cognition and memory normal.        Judgment: Judgment normal.     Comments: Insight intact Mood presents as less anxious and less depressed compared to recent exams    Lab Review:     Component Value Date/Time   NA 139 06/26/2017 0025   NA 138 08/31/2011 1624   K 3.7 06/26/2017 0025   K 4.1 08/31/2011 1624   CL 105 06/26/2017 0025   CL 102 08/31/2011 1624   CO2 25 06/26/2017 0025   CO2 27 08/31/2011 1624   GLUCOSE 110 (H) 06/26/2017 0025   GLUCOSE 81 08/31/2011 1624   BUN 9 06/26/2017 0025   BUN 10 08/31/2011 1624   CREATININE 0.88 06/26/2017 0025   CREATININE 0.88 08/31/2011 1624   CALCIUM 9.1 06/26/2017 0025   CALCIUM 9.1 08/31/2011 1624   PROT 7.4 06/26/2017 0025   PROT 7.8 08/31/2011 1624   ALBUMIN 4.5 06/26/2017 0025   ALBUMIN 4.0 08/31/2011 1624   AST 19 06/26/2017 0025   AST 25 08/31/2011 1624   ALT 14 06/26/2017 0025   ALT 24 08/31/2011 1624   ALKPHOS 49 06/26/2017 0025   ALKPHOS 52 08/31/2011 1624    BILITOT 0.5 06/26/2017 0025   BILITOT 0.3 08/31/2011 1624   GFRNONAA >60 06/26/2017 0025   GFRNONAA >60 08/31/2011 1624   GFRAA >60 06/26/2017 0025   GFRAA >60 08/31/2011 1624       Component Value Date/Time   WBC 6.4 06/26/2017 0025   RBC 3.85 06/26/2017 0025   HGB 12.1 06/26/2017 0025   HGB 12.7 08/31/2011 1624   HCT 34.8 (L) 06/26/2017 0025   HCT 37.7 08/31/2011 1624   PLT 246 06/26/2017 0025   PLT 248 08/31/2011 1624   MCV 90.3 06/26/2017 0025   MCV 90 08/31/2011 1624   MCH 31.5 06/26/2017 0025   MCHC 34.9 06/26/2017 0025   RDW 11.9 06/26/2017 0025   RDW 12.6 08/31/2011 1624   LYMPHSABS 2.2 04/07/2017 1145   MONOABS 0.4 04/07/2017 1145   EOSABS 0.1 04/07/2017 1145   BASOSABS 0.0 04/07/2017 1145    No results found for: POCLITH, LITHIUM   No results found for: PHENYTOIN, PHENOBARB, VALPROATE, CBMZ   .res Assessment: Plan:    Will continue current plan of care since target signs and symptoms are well controlled without any tolerability issues. Continue Zyprexa 5 mg at bedtime for anxiety and insomnia. Continue Klonopin 0.5 mg in the morning and midday for anxiety. Continue Klonopin 1 mg at bedtime for anxiety and insomnia. Continue clonidine 0.1 mg at bedtime and 1/2 to 1 tablet daily as needed for anxiety. Recommend continuing psychotherapy with Stevphen Meuse, [facility]. Patient to follow-up with this provider in 3 months or sooner if clinically indicated. Patient advised to contact office with any questions, adverse effects, or acute worsening in signs and symptoms.   Peggy Ruiz was seen today for follow-up.  Diagnoses and all orders for this visit:  PTSD (post-traumatic stress disorder) -     clonazePAM (KLONOPIN) 0.5 MG tablet; Take 1 tablet in the morning and 1 tablet mid-day. -     cloNIDine (CATAPRES) 0.1 MG tablet; Take 1 tab po QHS and 1 qd prn  Generalized anxiety disorder -     clonazePAM (KLONOPIN) 0.5 MG tablet;  Take 1 tablet in the morning and 1 tablet  mid-day. -     cloNIDine (CATAPRES) 0.1 MG tablet; Take 1 tab po QHS and 1 qd prn -     OLANZapine (ZYPREXA) 5 MG tablet; Take 1 tablet (5 mg total) by mouth at bedtime.  Insomnia, unspecified type -     OLANZapine (ZYPREXA) 5 MG tablet; Take 1 tablet (5 mg total) by mouth at bedtime.    Please see After Visit Summary for patient specific instructions.  Future Appointments  Date Time Provider Department Center  01/05/2021  3:00 PM Stevphen Meusengram, [place], [facility] None  01/19/2021  2:00 PM Stevphen Meusengram, [place], [facility] CP-CP None  02/02/2021  2:00 PM Stevphen Meusengram, [facility], [facility] None  02/16/2021  2:00 PM Stevphen Meusengram, [place], [facility] None  03/03/2021  2:00 PM Stevphen Meusengram, Holly, [facility] None  03/17/2021  2:00 PM Stevphen Meusengram, [place], [facility] None  03/23/2021  1:00 PM Corie Chiquitoarter, Chante [place], PMHNP CP-CP None    No orders of the defined types were placed in this encounter.   -------------------------------

## 2020-12-22 NOTE — Progress Notes (Signed)
   12/22/20 1254  Facial and Oral Movements  Muscles of Facial Expression 0  Lips and Perioral Area 0  Jaw 0  Tongue 0  Extremity Movements  Upper (arms, wrists, hands, fingers) 0  Lower (legs, knees, ankles, toes) 0  Trunk Movements  Neck, shoulders, hips 0  Overall Severity  Severity of abnormal movements (highest score from questions above) 0  Incapacitation due to abnormal movements 0  Patient's awareness of abnormal movements (rate only patient's report) 0  AIMS Total Score  AIMS Total Score 0

## 2021-01-05 ENCOUNTER — Other Ambulatory Visit: Payer: Self-pay

## 2021-01-05 ENCOUNTER — Ambulatory Visit: Payer: PRIVATE HEALTH INSURANCE | Admitting: Psychiatry

## 2021-01-05 DIAGNOSIS — F4310 Post-traumatic stress disorder, unspecified: Secondary | ICD-10-CM | POA: Diagnosis not present
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
## 2021-01-19 ENCOUNTER — Other Ambulatory Visit: Payer: Self-pay

## 2021-01-19 ENCOUNTER — Ambulatory Visit: Payer: PRIVATE HEALTH INSURANCE | Admitting: Psychiatry

## 2021-01-19 ENCOUNTER — Telehealth: Payer: Self-pay | Admitting: Psychiatry

## 2021-01-19 DIAGNOSIS — F4310 Post-traumatic stress disorder, unspecified: Secondary | ICD-10-CM

## 2021-01-19 NOTE — Progress Notes (Signed)
[facility] Counselor/Therapist Progress Note  Patient ID: Peggy Ruiz, MRN: [id number],    Date: 01/19/2021  Time Spent: 52 minutes start time 2:03 PM end time 2:55 PM  Treatment Type: Individual Therapy  Reported Symptoms: frustrated, anxiety, triggered responses, panic, chronic pain, melt downs, rumination, fatigue  Mental Status Exam:  Appearance:   Well Groomed     Behavior:  Appropriate  Motor:  Normal  Speech/Language:   Normal Rate  Affect:  Appropriate  Mood:  anxious  Thought process:  normal  Thought content:    WNL  Sensory/Perceptual disturbances:    WNL  Orientation:  oriented to person, place, time/date, and situation  Attention:  Good  Concentration:  Good  Memory:  WNL  Fund of knowledge:   Good  Insight:    Good  Judgment:   Good  Impulse Control:  Good   Risk Assessment: Danger to Self:  No Self-injurious Behavior: No Danger to Others: No Duty to Warn:no Physical Aggression / Violence:No  Access to Firearms a concern: No  Gang Involvement:No   Subjective: patient was present for session.  She shared that she and her husband got COVID again which was very hard but she got through it.  She shared she was frustrated due to husband having to work on the weekend and than he got a big raise that was very helpful. She shared she followed through from plans from last session and is making a play list of songs to help her calm down when she gets upset.  She shared she did have another melt down after a little while she went and used her play list and that calmed her down. She has also started making her list of positive accomplishments. She shared she is realizing she has to remind herself that things will be okay.  Discussed different ways that she can start catching herself faster when she starts having her meltdowns or/gets triggered.  Patient went on to share that they were able to have dinner with Arlys John and it went well overall.  He did start having a  meltdown when he got more tired but they had already talked about the fact that they would just not address it unless it escalated to a point where it had to be addressed.  She shared that since the family did that he come down quicker and they were able to prevent something explosive from happening.  Patient is going to her neurologist in [place] next week for multiple test to see what can be done to help her back.  She reported she was even managing that stress and feeling positive about what may be accomplished.  Patient also shared that she and her husband are going out with his family for dinner.  Acknowledged that a year ago they were not speaking so huge steps have occurred and she is managing her emotions much more appropriately.  Patient was encouraged to continue using all the strategies and skills that have moved her into a more positive direction.  Interventions: Cognitive Behavioral Therapy, Solution-Oriented/Positive Psychology, and Insight-Oriented  Diagnosis:   ICD-10-CM   1. Posttraumatic stress disorder  F43.10       Plan: Patient is to use CBT and coping skills to decrease triggered responses.  Patient is to continue utilizing her play list when she starts getting agitated.  Patient is to write down positive accomplishments each day to read when she starts feeling like things are never going to be okay.  Patient is to continue working with her providers on her medical issues.  Patient is to continue taking medication as directed. Long-term goal: Interact normally with friends and family without irrational fears or intrusive thoughts that control behavior.  Display a full range of emotions without experiencing loss of control Short-term goal: Identify and replace negative self talk and catastrophic sizing that is associated with past trauma and current stimuli triggers for anxiety  Stevphen Meuse, [facility]

## 2021-01-19 NOTE — Telephone Encounter (Signed)
Pt needs a refill on her klonopin 1 mg to be sent to the [facility] on [address] in [place]

## 2021-01-19 NOTE — Telephone Encounter (Signed)
Not due until 7/29

## 2021-01-22 ENCOUNTER — Other Ambulatory Visit: Payer: Self-pay

## 2021-01-22 DIAGNOSIS — F411 Generalized anxiety disorder: Secondary | ICD-10-CM

## 2021-01-22 DIAGNOSIS — F4310 Post-traumatic stress disorder, unspecified: Secondary | ICD-10-CM

## 2021-01-22 MED ORDER — CLONAZEPAM 1 MG PO TABS
1.0000 mg | ORAL_TABLET | Freq: Every day | ORAL | 2 refills | Status: DC
Start: 2021-01-22 — End: 2021-03-17

## 2021-01-22 NOTE — Telephone Encounter (Signed)
Pended.

## 2021-02-02 ENCOUNTER — Other Ambulatory Visit: Payer: Self-pay

## 2021-02-02 ENCOUNTER — Ambulatory Visit: Payer: PRIVATE HEALTH INSURANCE | Admitting: Psychiatry

## 2021-02-02 DIAGNOSIS — F4310 Post-traumatic stress disorder, unspecified: Secondary | ICD-10-CM | POA: Diagnosis not present
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
## 2021-02-11 ENCOUNTER — Telehealth: Payer: Self-pay | Admitting: Psychiatry

## 2021-02-11 DIAGNOSIS — F411 Generalized anxiety disorder: Secondary | ICD-10-CM

## 2021-02-11 DIAGNOSIS — G4700 Insomnia, unspecified: Secondary | ICD-10-CM

## 2021-02-11 NOTE — Telephone Encounter (Signed)
Pt called and said that her anxiety attacks are worse and her mood swings are also bad. She would like to know if she can increase the zyprexa. Please gave her a call at [phone]

## 2021-02-16 ENCOUNTER — Other Ambulatory Visit: Payer: Self-pay

## 2021-02-16 ENCOUNTER — Ambulatory Visit (INDEPENDENT_AMBULATORY_CARE_PROVIDER_SITE_OTHER): Payer: PRIVATE HEALTH INSURANCE | Admitting: Psychiatry

## 2021-02-16 DIAGNOSIS — F4310 Post-traumatic stress disorder, unspecified: Secondary | ICD-10-CM | POA: Diagnosis not present

## 2021-02-16 MED ORDER — OLANZAPINE 7.5 MG PO TABS: 7.5000 mg | ORAL_TABLET | Freq: Every day | ORAL | 0 refills | Status: DC

## 2021-02-16 NOTE — Telephone Encounter (Signed)
Pt called back checking staus. Please respond

## 2021-02-16 NOTE — Telephone Encounter (Signed)
Pt stated at the last visit you told her she could increase zyprexa if she had increased anxiety symptoms.She does take her klonopin but she has been having some increased mood swings and anxiety attacks lately.Appt is at the end of next month.

## 2021-02-16 NOTE — Telephone Encounter (Signed)
Script sent for 7.5 mg tabs. Pt notified.

## 2021-02-16 NOTE — Progress Notes (Signed)
[facility] Counselor/Therapist Progress Note  Patient ID: Peggy Ruiz, MRN: [id number],    Date: 02/16/2021  Time Spent: 52 minutes start time 2:06 PM end time 2:58 PM  Treatment Type: Individual Therapy  Reported Symptoms: anxiety, panic, sadness, fatigue, crying ,yelling, rage, chronic pain, rumination , Mental Status Exam:  Appearance:   Well Groomed     Behavior:  Appropriate  Motor:  Normal  Speech/Language:   Pressured  Affect:  Congruent and Tearful  Mood:  anxious  Thought process:  normal  Thought content:    WNL  Sensory/Perceptual disturbances:    WNL  Orientation:  oriented to person, place, time/date, and situation  Attention:  Good  Concentration:  Good  Memory:  WNL  Fund of knowledge:   Good  Insight:    Good  Judgment:   Good  Impulse Control:  Good   Risk Assessment: Danger to Self:  No Self-injurious Behavior: No Danger to Others: No Duty to Warn:no Physical Aggression / Violence:No  Access to Firearms a concern: No  Gang Involvement:No   Subjective: Patient was present for session. Patient shared she is having lots of stress due to finances.  She is feeling extreme fatigue. Patient went to share that she is having extreme anxiety and rage at night for some days and than she can be okay other days.  She stated it got so extreme she was jumping up and down and pounding on the floor after she got bad news. She stated that she blacked out with rage.  Patient was encouraged to think through the pattern with her outbursts.  She was able to recognize that they seem to happen when things fall apart and there could be some financial issues.  Discussed the fact that patient is unable to work currently to bring in more money and she has guilt about not being able to work.  Patient was able to recognize that that guilt leads to her panic and that leads to her rage.  Discussed the importance of patient trying to find some ways that she can feel she makes a  difference and has a purpose.  Discussed different places that she may be able to volunteer that could help her feel like she is making a difference even though her time there and what she can do will be very limited due to her physical limitations.  Patient was also encouraged to continue writing down what she does accomplish and things that she does feel positive about to read in those moments where she starts getting very overwhelmed.  Patient is to start journaling more just to release her emotions regularly instead of letting them build up.  Patient is to continue discussing her feelings and frustrations with her husband so that they can work on things she can accomplish together as well.  Interventions: CBT, solution focused positive therapy, insight oriented  Diagnosis:   ICD-10-CM   1. Posttraumatic stress disorder  F43.10       Plan: Patient is to use CBT and coping skills to decrease triggered responses.  Patient is to write out the things that she is successful at and feels good about to read when she gets upset.  Patient is to find some ways that she can volunteer or do something that she feels useful at.  Patient is to keep talking with her husband about her feelings and really some inappropriate manners.  Patient is to take medication as directed. Long-term goal: Interact normally with  friends and family without irrational fears or intrusive thoughts that control behavior.  Display a full range of emotions without experiencing loss of control Short-term goal: Identify and replace negative self talk and catastrophic sizing that is associated with past trauma and current stimuli triggers for anxiety  Stevphen Meuse, [facility]

## 2021-02-24 ENCOUNTER — Telehealth: Payer: Self-pay | Admitting: Psychiatry

## 2021-02-24 NOTE — Telephone Encounter (Signed)
Can we put her in at Jessica's work-in slot at 11:30 on Fri, 9/2?

## 2021-02-24 NOTE — Telephone Encounter (Signed)
Please review

## 2021-02-24 NOTE — Telephone Encounter (Signed)
Yes, I was just getting ready to request that. Thanks

## 2021-02-24 NOTE — Telephone Encounter (Signed)
Pt states that even though Jessica increased the Olanzapine from 5mg  to 7.5mg , she is still very anxious and crying all day. (She was crying in her VM).  Says she can't wait until 9/27 appt with 10/27 and would like to be seen earlier (if possible, during Jessica's lunch).

## 2021-02-26 ENCOUNTER — Other Ambulatory Visit: Payer: Self-pay

## 2021-02-26 ENCOUNTER — Encounter: Payer: Self-pay | Admitting: Psychiatry

## 2021-02-26 ENCOUNTER — Ambulatory Visit: Payer: PRIVATE HEALTH INSURANCE | Admitting: Psychiatry

## 2021-02-26 DIAGNOSIS — G4700 Insomnia, unspecified: Secondary | ICD-10-CM

## 2021-02-26 DIAGNOSIS — F411 Generalized anxiety disorder: Secondary | ICD-10-CM

## 2021-02-26 MED ORDER — OLANZAPINE 10 MG PO TABS: 10.0000 mg | ORAL_TABLET | Freq: Every day | ORAL | 0 refills | Status: DC

## 2021-02-26 NOTE — Progress Notes (Signed)
Peggy ShadowLeah C Zobrist [id number] 03/02/1986 34 y.o.  Subjective:   Patient ID:  Peggy Ruiz is a 35 y.o. (DOB 05/14/1986) female.  Chief Complaint:  Chief Complaint  Patient presents with   Anxiety   Panic Attack   Depression    HPI Peggy ShadowLeah C Mensch presents to the office today for follow-up of anxiety, mood disturbance, and insomnia. She increased Olanzapine to 7.5 mg po QHS one week ago. She reports "every week it gets worse. I am getting severe anxiety attacks several times a day." She reports uncontrollable, inconsolable crying with anxiety attacks. She reports high levels of anxiety in between attacks. She reports that she has also been having anger and "rage." She had one episode where she was rolling around in the floor and pulling out her hair. "I've never felt this way before... felt so out of control." She reports depressed mood and feels "useless." Energy and motivation have been low. Sleeping well. She reports that she is more tired upon awakening since increase in Olanzapine to 7.5 mg dose. Appetite has been the same. Concentration has been diminished and has not been able to read as often. Denies impulsivity other than jumping when she was angry, which exacerbated her chronic back pain.   "Ever since we moved into our house on April 30th,... something got triggered." Reports that she has been working with therapist on identifying and working through triggers.   She is home by herself through the week for 10-12 hours Monday-Friday and sometimes on weekends. She is farther away from friends and family and is no longer able to see them as often. "I feel all alone." She reports "there's not much to do."   Denies SI. Reports that she has said she does "not want to keep living like this, not I don't want to live."   Past Psychiatric Medication Trials: Cymbalta- Effective for a period of time and then no longer as effective.  Prozac Lexapro Paxil- Initially helped and then did not seem to  be as effective when used without Zyprexa Effexor Nortriptyline-prescribed by GI for gastroparesis Remeron-caused insomnia Buspar- Adverse reaction Abilify-involuntary movements Zyprexa-effective and took for about 14 years.  Caused excessive weight gain at times. Seroquel Latuda-effective. Sleeping "harder" on 80 mg. Lamictal Gabapentin- Cannot tolerate doses higher than 800 mg BID Trileptal- Reports that she was unable to tolerate 900 mg QHS Belsomra-helpful with sleep quality but not sleep quantity. Klonopin Ativan Librium Clonidine- Effective Propranolol Trazodone Ambien Nuvigil Adderall Deplin    AIMS    Flowsheet Row Office Visit from 02/26/2021 in [facility] Office Visit from 12/22/2020 in [facility] Office Visit from 09/02/2020 in [facility] Office Visit from 02/21/2020 in [facility] Office Visit from 02/25/2019 in [facility]  AIMS Total Score 0 0 0 0 0        Review of Systems:  Review of Systems  Gastrointestinal: Negative.   Musculoskeletal:  Positive for back pain. Negative for gait problem.  Neurological:  Negative for tremors.  Psychiatric/Behavioral:         Please refer to HPI   Medications: I have reviewed the patient's current medications.  Current Outpatient Medications  Medication Sig Dispense Refill   clonazePAM (KLONOPIN) 0.5 MG tablet Take 1 tablet in the morning and 1 tablet mid-day. 60 tablet 5   cloNIDine (CATAPRES) 0.1 MG tablet Take 1 tab po QHS and 1 qd prn 180 tablet 0   dexlansoprazole (DEXILANT) 60 MG capsule Take 60  mg by mouth every morning.     famotidine (PEPCID) 20 MG tablet Take 20 mg by mouth 2 (two) times daily.      gabapentin (NEURONTIN) 800 MG tablet Take 800 mg by mouth 2 (two) times daily.     levothyroxine (SYNTHROID, LEVOTHROID) 50 MCG tablet Take 50 mcg by mouth daily.     Lifitegrast (XIIDRA) 5 % SOLN Apply to eye.     magnesium 30 MG  tablet Take 30 mg by mouth 2 (two) times daily.     morphine (MSIR) 15 MG tablet morphine 15 mg immediate release tablet  Take 1 tablet 3 times a day by oral route as needed.     Multiple Vitamin (MULTIVITAMIN) tablet Take 1 tablet by mouth daily.     nortriptyline (PAMELOR) 10 MG capsule Take 1 capsule (10 mg total) by mouth at bedtime. 90 capsule 0   ondansetron (ZOFRAN-ODT) 8 MG disintegrating tablet Take 8 mg by mouth every 8 (eight) hours as needed for nausea.     Probiotic Product (PROBIOTIC DAILY PO) Take by mouth.     clonazePAM (KLONOPIN) 1 MG tablet Take 1 tablet (1 mg total) by mouth at bedtime. 30 tablet 2   levonorgestrel-ethinyl estradiol (SEASONALE,INTROVALE,JOLESSA) 0.15-0.03 MG tablet Take 1 tablet by mouth daily.     OLANZapine (ZYPREXA) 10 MG tablet Take 1 tablet (10 mg total) by mouth at bedtime. 30 tablet 0   No current facility-administered medications for this visit.    Medication Side Effects: Other: Excessive daytime somnolence  Allergies:  Allergies  Allergen Reactions   Promethazine     Other reaction(s): Agitation   Ciprofloxacin Other (See Comments)    c-diff   Dilaudid [Hydromorphone]     sob   Epinephrine    Penicillins Hives    Has patient had a PCN reaction causing immediate rash, facial/tongue/throat swelling, SOB or lightheadedness with hypotension: Yes Has patient had a PCN reaction causing severe rash involving mucus membranes or skin necrosis: No Has patient had a PCN reaction that required hospitalization: No Has patient had a PCN reaction occurring within the last 10 years: Yes If all of the above answers are "NO", then may proceed with Cephalosporin use.    Toradol [Ketorolac Tromethamine] Nausea And Vomiting    Past Medical History:  Diagnosis Date   Anorexia    at 29   Anxiety    Attention deficit disorder (ADD)    Bone spur    Depression    Eczema    HPV (human papilloma virus) infection     Past Medical History, Surgical  history, Social history, and Family history were reviewed and updated as appropriate.   Please see review of systems for further details on the patient's review from today.   Objective:   Physical Exam:  BP 115/76   Pulse 84   Wt 150 lb (68 kg)   BMI 22.15 kg/m   Physical Exam Constitutional:      General: She is not in acute distress. Musculoskeletal:        General: No deformity.  Neurological:     Mental Status: She is alert and oriented to person, place, and time.     Coordination: Coordination normal.  Psychiatric:        Attention and Perception: Attention and perception normal. She does not perceive auditory or visual hallucinations.        Mood and Affect: Mood is anxious and depressed. Affect is not labile, blunt, angry or inappropriate.  Speech: Speech normal.        Behavior: Behavior normal.        Thought Content: Thought content normal. Thought content is not paranoid or delusional. Thought content does not include homicidal or suicidal ideation. Thought content does not include homicidal or suicidal plan.        Cognition and Memory: Cognition and memory normal.        Judgment: Judgment normal.     Comments: Insight intact    Lab Review:     Component Value Date/Time   NA 139 06/26/2017 0025   NA 138 08/31/2011 1624   K 3.7 06/26/2017 0025   K 4.1 08/31/2011 1624   CL 105 06/26/2017 0025   CL 102 08/31/2011 1624   CO2 25 06/26/2017 0025   CO2 27 08/31/2011 1624   GLUCOSE 110 (H) 06/26/2017 0025   GLUCOSE 81 08/31/2011 1624   BUN 9 06/26/2017 0025   BUN 10 08/31/2011 1624   CREATININE 0.88 06/26/2017 0025   CREATININE 0.88 08/31/2011 1624   CALCIUM 9.1 06/26/2017 0025   CALCIUM 9.1 08/31/2011 1624   PROT 7.4 06/26/2017 0025   PROT 7.8 08/31/2011 1624   ALBUMIN 4.5 06/26/2017 0025   ALBUMIN 4.0 08/31/2011 1624   AST 19 06/26/2017 0025   AST 25 08/31/2011 1624   ALT 14 06/26/2017 0025   ALT 24 08/31/2011 1624   ALKPHOS 49 06/26/2017 0025    ALKPHOS 52 08/31/2011 1624   BILITOT 0.5 06/26/2017 0025   BILITOT 0.3 08/31/2011 1624   GFRNONAA >60 06/26/2017 0025   GFRNONAA >60 08/31/2011 1624   GFRAA >60 06/26/2017 0025   GFRAA >60 08/31/2011 1624       Component Value Date/Time   WBC 6.4 06/26/2017 0025   RBC 3.85 06/26/2017 0025   HGB 12.1 06/26/2017 0025   HGB 12.7 08/31/2011 1624   HCT 34.8 (L) 06/26/2017 0025   HCT 37.7 08/31/2011 1624   PLT 246 06/26/2017 0025   PLT 248 08/31/2011 1624   MCV 90.3 06/26/2017 0025   MCV 90 08/31/2011 1624   MCH 31.5 06/26/2017 0025   MCHC 34.9 06/26/2017 0025   RDW 11.9 06/26/2017 0025   RDW 12.6 08/31/2011 1624   LYMPHSABS 2.2 04/07/2017 1145   MONOABS 0.4 04/07/2017 1145   EOSABS 0.1 04/07/2017 1145   BASOSABS 0.0 04/07/2017 1145    No results found for: POCLITH, LITHIUM   No results found for: PHENYTOIN, PHENOBARB, VALPROATE, CBMZ   .res Assessment: Plan:   Will increase Olanzapine to 10 mg po QHS to improve acute anxiety and mood s/s.  Will continue all other medications as prescribed.  Recommend continuing therapy with Stevphen Meuse, [facility]. Pt to follow-up with this provider in 3-4 weeks or sooner if clinically indicated.  Patient advised to contact office with any questions, adverse effects, or acute worsening in signs and symptoms.  Kateline was seen today for anxiety, panic attack and depression.  Diagnoses and all orders for this visit:  Generalized anxiety disorder -     OLANZapine (ZYPREXA) 10 MG tablet; Take 1 tablet (10 mg total) by mouth at bedtime.  Insomnia, unspecified type -     OLANZapine (ZYPREXA) 10 MG tablet; Take 1 tablet (10 mg total) by mouth at bedtime.    Please see After Visit Summary for patient specific instructions.  Future Appointments  Date Time Provider Department Center  03/03/2021  2:00 PM Stevphen Meuse, [facility] CP-CP None  03/17/2021  1:00 PM Corie Chiquito, PMHNP  CP-CP None  03/17/2021  2:00 PM Stevphen Meuse, [facility] CP-CP None   03/31/2021  2:00 PM Stevphen Meuse, [facility] CP-CP None  04/14/2021  2:00 PM Stevphen Meuse, [facility] CP-CP None  04/28/2021  2:00 PM Stevphen Meuse, [facility] CP-CP None    No orders of the defined types were placed in this encounter.   -------------------------------

## 2021-02-26 NOTE — Progress Notes (Signed)
   02/26/21 1152  Facial and Oral Movements  Muscles of Facial Expression 0  Lips and Perioral Area 0  Jaw 0  Tongue 0  Extremity Movements  Upper (arms, wrists, hands, fingers) 0  Lower (legs, knees, ankles, toes) 0  Trunk Movements  Neck, shoulders, hips 0  Overall Severity  Severity of abnormal movements (highest score from questions above) 0  Incapacitation due to abnormal movements 0  Patient's awareness of abnormal movements (rate only patient's report) 0  AIMS Total Score  AIMS Total Score 0

## 2021-03-03 ENCOUNTER — Ambulatory Visit: Payer: PRIVATE HEALTH INSURANCE | Admitting: Psychiatry

## 2021-03-03 ENCOUNTER — Other Ambulatory Visit: Payer: Self-pay

## 2021-03-03 DIAGNOSIS — F4310 Post-traumatic stress disorder, unspecified: Secondary | ICD-10-CM | POA: Diagnosis not present

## 2021-03-03 NOTE — Progress Notes (Signed)
[facility] Counselor/Therapist Progress Note  Patient ID: Peggy Ruiz, MRN: [id number],    Date: 03/03/2021  Time Spent: 60 minutes start time 2:02 PM end 3:02 PM  Treatment Type: Individual Therapy  Reported Symptoms: anxiety, fatigue, depression, panic attacks, crying spells, range, meltdowns, flashbacks, chronic pain  Mental Status Exam:  Appearance:   Casual and Neat     Behavior:  Appropriate  Motor:  Normal  Speech/Language:   pressured  Affect:  Appropriate  Mood:  anxious  Thought process:  circumstantial  Thought content:    WNL  Sensory/Perceptual disturbances:    Back pain  Orientation:  oriented to person, place, time/date, and situation  Attention:  Good  Concentration:  Good  Memory:  WNL  Fund of knowledge:   Good  Insight:    Good  Judgment:   Good  Impulse Control:  Good   Risk Assessment: Danger to Self:  No Self-injurious Behavior: No Danger to Others: No Duty to Warn:no Physical Aggression / Violence:No  Access to Firearms a concern: No  Gang Involvement:No   Subjective: Patient was present for session.  She shared that she is realizing that she is alone most of the time and it is not healthy for her. She shared she wants to be able to make an impact on others.  She stated that she is considering volunteer opportunities.She went on to share that being father from her parents is hard because she has taken care of them since she is a little girl and now she can't due to being an hour and half away from them. She shared that her mother did call and state that they had to take her dad to the hospital due to them having coalitus.  She also shared she will be having shots in her back due to the extreme pain that is make it difficult for her to function.  Patient did EMDR set on being far away from parents, suds level 9, negative cognition "I have to be there" felt guilt in her hands and her feet.  Patient was able to reduce suds level to 2 /3.  She was  able to recognize that she has been trying to take care of her parents since she was 5.  Patient discussed the fact that when she was in the eating disorder clinic at 59 he had shared that she and her parents were enmeshed and that her feelings seem to be their feelings.  Encouraged patient to think through what she needed to tell herself so that she could have appropriate boundaries and realize her job is to love and care for her parents but not to fix them.  Patient agreed to work on that over the next weeks.  Interventions: Cognitive Behavioral Therapy, Solution-Oriented/Positive Psychology, Eye Movement Desensitization and Reprocessing (EMDR), and Insight-Oriented  Diagnosis:   ICD-10-CM   1. Posttraumatic stress disorder  F43.10       Plan: Patient is to use CBT and coping skills to decrease triggered responses.  Patient is to work on reminding herself that her job is to love and care for her parents but not to fix them.  Patient is to work on trying to find ways to get out of the house more and have more contact with other people.  Patient is to continue taking medication as directed. Long-term goal: Interact normally with friends and family without irrational fears or intrusive thoughts that control behavior.  Display a full range of emotions without  experiencing loss of control Short-term goal: Identify and replace negative self talk and catastrophic sizing that is associated with past trauma and current stimuli triggers for anxiety  Lina Sayre, [facility]

## 2021-03-17 ENCOUNTER — Other Ambulatory Visit: Payer: Self-pay

## 2021-03-17 ENCOUNTER — Ambulatory Visit: Payer: PRIVATE HEALTH INSURANCE | Admitting: Psychiatry

## 2021-03-17 ENCOUNTER — Encounter: Payer: Self-pay | Admitting: Psychiatry

## 2021-03-17 DIAGNOSIS — F411 Generalized anxiety disorder: Secondary | ICD-10-CM | POA: Diagnosis not present

## 2021-03-17 DIAGNOSIS — G4700 Insomnia, unspecified: Secondary | ICD-10-CM | POA: Diagnosis not present

## 2021-03-17 DIAGNOSIS — F4310 Post-traumatic stress disorder, unspecified: Secondary | ICD-10-CM | POA: Diagnosis not present

## 2021-03-17 MED ORDER — OLANZAPINE 10 MG PO TABS: 10.0000 mg | ORAL_TABLET | Freq: Every day | ORAL | 2 refills | Status: DC

## 2021-03-17 MED ORDER — CLONAZEPAM 1 MG PO TABS: 1.0000 mg | ORAL_TABLET | Freq: Every day | ORAL | 5 refills | Status: DC

## 2021-03-17 NOTE — Progress Notes (Signed)
[name] [id number] [birth year] 35 y.o.  Subjective:   Patient ID:  Peggy Ruiz is a 35 y.o. (DOB 1985-09-13) female.  Chief Complaint:  Chief Complaint  Patient presents with   Anxiety   Follow-up    Mood disturbance, Insomnia     Anxiety    [name] presents to the office today for follow-up of anxiety, mood disturbance, and insomnia. She reports some weight gain and that her clothes are fitting differently. She reports that her appetite has been increased and is eating more at one sitting.   She reports that she is sleeping significantly better with Olanzapine 10 mg and that she is not having any worsening fatigue at 10 mg compared to 7.5 mg. She reports that panic attacks are not as frequent or as intense. She reports that she has had crying episodes at times. She reports that she and her husband have noticed she has not had anymore episodes of agitation where she is banging things or thrashing. She reports that she gets overwhelmed at times with multiple stressors and responsibilities. She reports that her husband has noticed she has not been as angry. She reports some improvement in generalized anxiety. She reports some resolution of several major stressors. She reports that she is trying to decide which sod options to chose- "because I want to make the perfectly right decision." She reports that she has some perfectionistic thinking and has anxiety when she feels that she does not have control. Has some fatigue in the mornings. Energy has been ok during the day. Motivation has been ok. She reports concentration has improved. Denies SI.   Completed application for [employer].   Past Psychiatric Medication Trials: Cymbalta- Effective for a period of time and then no longer as effective.  Prozac Lexapro Paxil- Initially helped and then did not seem to be as effective when used without Zyprexa Effexor Nortriptyline-prescribed by GI for  gastroparesis Remeron-caused insomnia Buspar- Adverse reaction Abilify-involuntary movements Zyprexa-effective and took for about 14 years.  Caused excessive weight gain at times. Seroquel Latuda-effective. Sleeping "harder" on 80 mg. Lamictal Gabapentin- Cannot tolerate doses higher than 800 mg BID Trileptal- Reports that she was unable to tolerate 900 mg QHS Belsomra-helpful with sleep quality but not sleep quantity. Klonopin Ativan Librium Clonidine- Effective Propranolol Trazodone Ambien Nuvigil Adderall Deplin    AIMS    Flowsheet Row Office Visit from 02/26/2021 in [facility] Office Visit from 12/22/2020 in [facility] Office Visit from 09/02/2020 in [facility] Office Visit from 02/21/2020 in [facility] Office Visit from 02/25/2019 in [facility]  AIMS Total Score 0 0 0 0 0        Review of Systems:  Review of Systems  Musculoskeletal:  Negative for gait problem.  Neurological:  Negative for tremors.  Psychiatric/Behavioral:         Please refer to HPI   Medications: I have reviewed the patient's current medications.  Current Outpatient Medications  Medication Sig Dispense Refill   clonazePAM (KLONOPIN) 0.5 MG tablet Take 1 tablet in the morning and 1 tablet mid-day. 60 tablet 5   [START ON 04/20/2021] clonazePAM (KLONOPIN) 1 MG tablet Take 1 tablet (1 mg total) by mouth at bedtime. 30 tablet 5   cloNIDine (CATAPRES) 0.1 MG tablet Take 1 tab po QHS and 1 qd prn 180 tablet 0   dexlansoprazole (DEXILANT) 60 MG capsule Take 60 mg by mouth every morning.  famotidine (PEPCID) 20 MG tablet Take 20 mg by mouth 2 (two) times daily.      gabapentin (NEURONTIN) 800 MG tablet Take 800 mg by mouth 2 (two) times daily.     levonorgestrel-ethinyl estradiol (SEASONALE,INTROVALE,JOLESSA) 0.15-0.03 MG tablet Take 1 tablet by mouth daily.     levothyroxine (SYNTHROID, LEVOTHROID) 50 MCG tablet  Take 50 mcg by mouth daily.     Lifitegrast (XIIDRA) 5 % SOLN Apply to eye.     magnesium 30 MG tablet Take 30 mg by mouth 2 (two) times daily.     morphine (MSIR) 15 MG tablet morphine 15 mg immediate release tablet  Take 1 tablet 3 times a day by oral route as needed.     Multiple Vitamin (MULTIVITAMIN) tablet Take 1 tablet by mouth daily.     nortriptyline (PAMELOR) 10 MG capsule Take 1 capsule (10 mg total) by mouth at bedtime. 90 capsule 0   OLANZapine (ZYPREXA) 10 MG tablet Take 1 tablet (10 mg total) by mouth at bedtime. 30 tablet 2   ondansetron (ZOFRAN-ODT) 8 MG disintegrating tablet Take 8 mg by mouth every 8 (eight) hours as needed for nausea.     Probiotic Product (PROBIOTIC DAILY PO) Take by mouth.     No current facility-administered medications for this visit.    Medication Side Effects: Other: Increased appetite and wt gain  Allergies:  Allergies  Allergen Reactions   Promethazine     Other reaction(s): Agitation   Ciprofloxacin Other (See Comments)    c-diff   Dilaudid [Hydromorphone]     sob   Epinephrine    Penicillins Hives    Has patient had a PCN reaction causing immediate rash, facial/tongue/throat swelling, SOB or lightheadedness with hypotension: Yes Has patient had a PCN reaction causing severe rash involving mucus membranes or skin necrosis: No Has patient had a PCN reaction that required hospitalization: No Has patient had a PCN reaction occurring within the last 10 years: Yes If all of the above answers are "NO", then may proceed with Cephalosporin use.    Toradol [Ketorolac Tromethamine] Nausea And Vomiting    Past Medical History:  Diagnosis Date   Anorexia    at 45   Anxiety    Attention deficit disorder (ADD)    Bone spur    Depression    Eczema    HPV (human papilloma virus) infection     Past Medical History, Surgical history, Social history, and Family history were reviewed and updated as appropriate.   Please see review of systems  for further details on the patient's review from today.   Objective:   Physical Exam:  Wt 151 lb (68.5 kg)   BMI 22.30 kg/m   Physical Exam Constitutional:      General: She is not in acute distress. Musculoskeletal:        General: No deformity.  Neurological:     Mental Status: She is alert and oriented to person, place, and time.     Coordination: Coordination normal.  Psychiatric:        Attention and Perception: Attention and perception normal. She does not perceive auditory or visual hallucinations.        Mood and Affect: Mood is anxious. Affect is not labile, blunt, angry or inappropriate.        Speech: Speech normal.        Behavior: Behavior normal. Behavior is cooperative.        Thought Content: Thought content normal. Thought content is  not paranoid or delusional. Thought content does not include homicidal or suicidal ideation. Thought content does not include homicidal or suicidal plan.        Cognition and Memory: Cognition and memory normal.        Judgment: Judgment normal.     Comments: Insight intact Less anxious and less depressed compared to last exam    Lab Review:     Component Value Date/Time   NA 139 06/26/2017 0025   NA 138 08/31/2011 1624   K 3.7 06/26/2017 0025   K 4.1 08/31/2011 1624   CL 105 06/26/2017 0025   CL 102 08/31/2011 1624   CO2 25 06/26/2017 0025   CO2 27 08/31/2011 1624   GLUCOSE 110 (H) 06/26/2017 0025   GLUCOSE 81 08/31/2011 1624   BUN 9 06/26/2017 0025   BUN 10 08/31/2011 1624   CREATININE 0.88 06/26/2017 0025   CREATININE 0.88 08/31/2011 1624   CALCIUM 9.1 06/26/2017 0025   CALCIUM 9.1 08/31/2011 1624   PROT 7.4 06/26/2017 0025   PROT 7.8 08/31/2011 1624   ALBUMIN 4.5 06/26/2017 0025   ALBUMIN 4.0 08/31/2011 1624   AST 19 06/26/2017 0025   AST 25 08/31/2011 1624   ALT 14 06/26/2017 0025   ALT 24 08/31/2011 1624   ALKPHOS 49 06/26/2017 0025   ALKPHOS 52 08/31/2011 1624   BILITOT 0.5 06/26/2017 0025   BILITOT 0.3  08/31/2011 1624   GFRNONAA >60 06/26/2017 0025   GFRNONAA >60 08/31/2011 1624   GFRAA >60 06/26/2017 0025   GFRAA >60 08/31/2011 1624       Component Value Date/Time   WBC 6.4 06/26/2017 0025   RBC 3.85 06/26/2017 0025   HGB 12.1 06/26/2017 0025   HGB 12.7 08/31/2011 1624   HCT 34.8 (L) 06/26/2017 0025   HCT 37.7 08/31/2011 1624   PLT 246 06/26/2017 0025   PLT 248 08/31/2011 1624   MCV 90.3 06/26/2017 0025   MCV 90 08/31/2011 1624   MCH 31.5 06/26/2017 0025   MCHC 34.9 06/26/2017 0025   RDW 11.9 06/26/2017 0025   RDW 12.6 08/31/2011 1624   LYMPHSABS 2.2 04/07/2017 1145   MONOABS 0.4 04/07/2017 1145   EOSABS 0.1 04/07/2017 1145   BASOSABS 0.0 04/07/2017 1145    No results found for: POCLITH, LITHIUM   No results found for: PHENYTOIN, PHENOBARB, VALPROATE, CBMZ   .res Assessment: Plan:   Discussed her concerns re: increased appetite and weight gain with increase in Olanzapine. She reports that benefits are currently outweighing side effects. Discussed considering Metformin in the future to address insulin resistance due to Olanzapine after seeking input from GI specialist if weight gain increases. Will continue Olanzapine 10 mg po QHS for anxiety and mood s/s.  Continue Klonopin 1 mg at bedtime for anxiety and insomnia.  Continue Klonopin 0.5 mg po BID for anxiety.  Continue Clonidine for anxiety.  Recommend continuing therapy with Stevphen Meuse, [facility]  Pt to follow-up in 2 months or sooner if clinically indicated.  Patient advised to contact office with any questions, adverse effects, or acute worsening in signs and symptoms.  Ananda was seen today for anxiety and follow-up.  Diagnoses and all orders for this visit:  Generalized anxiety disorder -     OLANZapine (ZYPREXA) 10 MG tablet; Take 1 tablet (10 mg total) by mouth at bedtime. -     clonazePAM (KLONOPIN) 1 MG tablet; Take 1 tablet (1 mg total) by mouth at bedtime.  Insomnia, unspecified type -  OLANZapine  (ZYPREXA) 10 MG tablet; Take 1 tablet (10 mg total) by mouth at bedtime.  PTSD (post-traumatic stress disorder) -     clonazePAM (KLONOPIN) 1 MG tablet; Take 1 tablet (1 mg total) by mouth at bedtime.    Please see After Visit Summary for patient specific instructions.  Future Appointments  Date Time Provider Department Center  03/31/2021  2:00 PM Stevphen Meuse, [facility] None  04/14/2021  2:00 PM Stevphen Meuse, [facility] None  04/28/2021  2:00 PM Stevphen Meuse, [facility] CP-CP None  05/11/2021  2:00 PM Stevphen Meuse, [facility] CP-CP None  05/11/2021  3:30 PM Corie Chiquito, [facility] CP-CP None  05/25/2021  3:00 PM Stevphen Meuse, [facility] CP-CP None    No orders of the defined types were placed in this encounter.   -------------------------------

## 2021-03-17 NOTE — Progress Notes (Signed)
      [facility] Counselor/Therapist Progress Note  Patient ID: Peggy Ruiz, MRN: [id number],    Date: 03/17/2021  Time Spent: 50 minutes start time 2:13 PM end time 3:03 PM  Treatment Type: Individual Therapy  Reported Symptoms: anxiety, triggered responses, sadness, panic attacks, frustration, chronic pain, crying spells  Mental Status Exam:  Appearance:   Well Groomed     Behavior:  Appropriate  Motor:  Normal  Speech/Language:   Normal Rate  Affect:  Appropriate  Mood:  anxious  Thought process:  normal  Thought content:    WNL  Sensory/Perceptual disturbances:    WNL  Orientation:  oriented to person, place, time/date, and situation  Attention:  Good  Concentration:  Good  Memory:  WNL  Fund of knowledge:   Good  Insight:    Good  Judgment:   Good  Impulse Control:  Good   Risk Assessment: Danger to Self:  No Self-injurious Behavior: No Danger to Others: No Duty to Warn:no Physical Aggression / Violence:No  Access to Firearms a concern: No  Gang Involvement:No   Subjective: Patient was present for session.  She shared that she is feeling better on medications.  She went on to share that the weight gain from medication is starting to lead to some body issues.  She shared that she has also had some panic.  Patient stated that she had found last session helpful and she had tried to find some different service opportunities.  Discussed different options for patient to continue pursuing since nothing seems to have worked out the way that it needed to.  Patient went on to explain she feels that her perspective gets very skewed and she overreacts to things that other people may not.  Discussed the fact that she is spending so much time alone and that that is giving her the opportunity to think about things that other people probably would not focus on and it also is making it difficult for her to have other perspectives to realize that other people are going through strange  things as well.  Discussed different CBT skills to help her work on perspective and keeping herself more grounded.  Patient was able to develop some plans that she could work on over the next week to help her have more interactions with others.  Patient was taught grounding exercises to utilize when she starts having any episodes or panic.  Interventions: Solution-Oriented/Positive Psychology and Insight-Oriented, CBT  Diagnosis:   ICD-10-CM   1. Posttraumatic stress disorder  F43.10       Plan: Patient is to use CBT and coping skills to decrease panic.  Patient is to work on CBT filters.  Patient is to work on [employer] opportunities.  Patient is to continue working with providers on medical issues.  Patient is to take medication as directed. Long-term goal: Interact normally with friends and family without irrational fears or intrusive thoughts that control behavior.  Display a full range of emotions without experiencing loss of control Short-term goal: Identify and replace negative self talk and catastrophic sizing that is associated with past trauma and current stimuli triggers for anxiety  Stevphen Meuse, [facility]

## 2021-03-23 ENCOUNTER — Ambulatory Visit: Payer: PRIVATE HEALTH INSURANCE | Admitting: Psychiatry

## 2021-03-31 ENCOUNTER — Other Ambulatory Visit: Payer: Self-pay

## 2021-03-31 ENCOUNTER — Ambulatory Visit: Payer: PRIVATE HEALTH INSURANCE | Admitting: Psychiatry

## 2021-03-31 DIAGNOSIS — F4310 Post-traumatic stress disorder, unspecified: Secondary | ICD-10-CM | POA: Diagnosis not present
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
## 2021-04-14 ENCOUNTER — Ambulatory Visit: Payer: PRIVATE HEALTH INSURANCE | Admitting: Psychiatry

## 2021-04-14 ENCOUNTER — Other Ambulatory Visit: Payer: Self-pay

## 2021-04-14 DIAGNOSIS — F4310 Post-traumatic stress disorder, unspecified: Secondary | ICD-10-CM | POA: Diagnosis not present
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
## 2021-04-28 ENCOUNTER — Ambulatory Visit: Payer: PRIVATE HEALTH INSURANCE | Admitting: Psychiatry

## 2021-04-28 ENCOUNTER — Other Ambulatory Visit: Payer: Self-pay

## 2021-04-28 DIAGNOSIS — F4310 Post-traumatic stress disorder, unspecified: Secondary | ICD-10-CM | POA: Diagnosis not present
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
## 2021-05-11 ENCOUNTER — Encounter: Payer: Self-pay | Admitting: Psychiatry

## 2021-05-11 ENCOUNTER — Telehealth (INDEPENDENT_AMBULATORY_CARE_PROVIDER_SITE_OTHER): Payer: PRIVATE HEALTH INSURANCE | Admitting: Psychiatry

## 2021-05-11 ENCOUNTER — Ambulatory Visit (INDEPENDENT_AMBULATORY_CARE_PROVIDER_SITE_OTHER): Payer: PRIVATE HEALTH INSURANCE | Admitting: Psychiatry

## 2021-05-11 DIAGNOSIS — F4310 Post-traumatic stress disorder, unspecified: Secondary | ICD-10-CM

## 2021-05-11 DIAGNOSIS — G4700 Insomnia, unspecified: Secondary | ICD-10-CM | POA: Diagnosis not present

## 2021-05-11 DIAGNOSIS — F411 Generalized anxiety disorder: Secondary | ICD-10-CM

## 2021-05-11 MED ORDER — CLONIDINE HCL 0.1 MG PO TABS: ORAL_TABLET | ORAL | 0 refills | Status: DC

## 2021-05-11 MED ORDER — OLANZAPINE 10 MG PO TABS: 10.0000 mg | ORAL_TABLET | Freq: Every day | ORAL | 0 refills | Status: DC

## 2021-05-11 MED ORDER — CLONAZEPAM 0.5 MG PO TABS: ORAL_TABLET | ORAL | 4 refills | Status: DC

## 2021-05-11 MED ORDER — CLONAZEPAM 1 MG PO TABS: 1.0000 mg | ORAL_TABLET | Freq: Every day | ORAL | 5 refills | Status: DC

## 2021-05-11 NOTE — Progress Notes (Signed)
[facility] Counselor/Therapist Progress Note  Patient ID: Peggy Ruiz, MRN: [id number],    Date: 05/11/2021  Time Spent: 55 minutes start time 2:03 PM end time end time 2:58 PM Virtual Visit via Video Note Connected with patient by a telemedicine/telehealth application, with their informed consent, and verified patient privacy and that I am speaking with the correct person using two identifiers. I discussed the limitations, risks, security and privacy concerns of performing psychotherapy and the availability of in person appointments. I also discussed with the patient that there may be a patient responsible charge related to this service. The patient expressed understanding and agreed to proceed. I discussed the treatment planning with the patient. The patient was provided an opportunity to ask questions and all were answered. The patient agreed with the plan and demonstrated an understanding of the instructions. The patient was advised to call  our office if  symptoms worsen or feel they are in a crisis state and need immediate contact.   Therapist Location: office Patient Location: home    Treatment Type: Individual Therapy  Reported Symptoms: anxiety, eating issues, obsessive thoughts, triggered responses, sadness  Mental Status Exam:  Appearance:   Casual and Neat     Behavior:  Appropriate  Motor:  Normal  Speech/Language:   Normal Rate  Affect:  Appropriate  Mood:  anxious  Thought process:  normal  Thought content:    WNL  Sensory/Perceptual disturbances:    WNL  Orientation:  oriented to person, place, time/date, and situation  Attention:  Good  Concentration:  Good  Memory:  WNL  Fund of knowledge:   Good  Insight:    Good  Judgment:   Good  Impulse Control:  Good   Risk Assessment: Danger to Self:  No Self-injurious Behavior: No Danger to Others: No Duty to Warn:no Physical Aggression / Violence:No  Access to Firearms a concern: No  Gang  Involvement:No   Subjective: Met with patient via virtual session. She shared she has been sick and so she wasn't able to come to the office.  She reported that she has had no panic attack since last session. She is getting obsessed with the scale. She explained that she is having the 2 voices in her head about eating and that is causing her anxiety.Processed the weight gain, 8, negative cognition- "I'm fat", anxiety, in stomach. Patient was able to reduce SUDS level to 4. Discussed the processing that would continue and ways to manage it appropriately. Patient also shared she had tests run on her hormones and it came out that her levels showed that they were in menopause. Patient is to discuss issues with her provider Thayer Headings PMHNP.  Interventions: Cognitive Behavioral Therapy, Solution-Oriented/Positive Psychology, Insight-Oriented, and BSP  Diagnosis:   ICD-10-CM   1. Posttraumatic stress disorder  F43.10       Plan: Patient is to use CBT and coping skills to decrease triggered responses.  Patient is to continue allowing processing between sessions.  Patient is to talk to her provider Thayer Headings, PMH, NP about medication and levels of hormones to decide if something needs to be adjusted.  Patient is to work on positive affirmations.  Patient is to continue volunteering.  Patient is to take medication as directed. Long-term goal: Interact normally with friends and family without irrational fears or intrusive thoughts that control behavior.  Display a full range of emotions without experiencing loss of control Short-term goal: Identify and replace negative self talk and  catastrophic sizing that is associated with past trauma and current stimuli triggers for anxiety  Lina Sayre, [facility]

## 2021-05-11 NOTE — Progress Notes (Signed)
Peggy Ruiz [id number] February 16, 1986 35 y.o.  Virtual Visit via Video Note  I connected with pt @ on 05/11/21 at  3:30 PM EST by a video enabled telemedicine application and verified that I am speaking with the correct person using two identifiers.   I discussed the limitations of evaluation and management by telemedicine and the availability of in person appointments. The patient expressed understanding and agreed to proceed.  I discussed the assessment and treatment plan with the patient. The patient was provided an opportunity to ask questions and all were answered. The patient agreed with the plan and demonstrated an understanding of the instructions.   The patient was advised to call back or seek an in-person evaluation if the symptoms worsen or if the condition fails to improve as anticipated.  I provided 30 minutes of non-face-to-face time during this encounter.  The patient was located at home.  The provider was located at [facility].   Corie Chiquito, PMHNP   Subjective:   Patient ID:  Peggy Ruiz is a 35 y.o. (DOB Dec 05, 1985) female.  Chief Complaint:  Chief Complaint  Patient presents with   Follow-up    Anxiety, Depression, insomnia    HPI Tinya C Graffius presents for follow-up of anxiety, depression, and insomnia."I haven't had nearly as many panic attacks or anxiety in the last month." Reports that she has had panic attacks only once or twice a month. Worry has been less and more manageable. She reports that she was sleeping better before URI. Husband has commented that she seems "more even keeled." She reports that she has gained some weight (about 12 lbs) since starting Zyprexa. She notices some "eating disordered thoughts" for the first time in years with thinking that she could skip meals. She reports that weight gain has started to plateau. She reports that she has been processing this in therapy.   She reports that mood has been ok. Energy has been lower  with being sick. She reports that she noticed a "hangover effect" in the morning after taking Olanzapine and that it is more difficult to get up in the morning. Energy is lower until 12 noon. Motivation has been good. Concentration has been good. Denies SI.   She reports that benefits are outweighing side effects of Olanzapine.  She reports that she has a current sinus infection .   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 She had been enjoying this.   Past Psychiatric Medication Trials: Cymbalta- Effective for a period of time and then no longer as effective.  Prozac Lexapro Paxil- Initially helped and then did not seem to be as effective when used without Zyprexa Effexor Nortriptyline-prescribed by GI for gastroparesis Remeron-caused insomnia Buspar- Adverse reaction Abilify-involuntary movements Zyprexa-effective and took for about 14 years.  Caused excessive weight gain at times. Seroquel Latuda-effective. Sleeping "harder" on 80 mg. Lamictal Gabapentin- Cannot tolerate doses higher than 800 mg BID Trileptal- Reports that she was unable to tolerate 900 mg QHS Belsomra-helpful with sleep quality but not sleep quantity. Klonopin Ativan Librium Clonidine- Effective Propranolol Trazodone Ambien Nuvigil Adderall Deplin  Review of Systems:  Review of Systems  HENT:  Positive for congestion, sinus pain and voice change.   Respiratory:  Positive for cough.   Gastrointestinal:  Positive for constipation.  Musculoskeletal:  Negative for gait problem.  Neurological:  Negative for tremors.  Psychiatric/Behavioral:         Please refer to HPI  Has new PCP. She reports that she said hormone levels are in postmenopausal range.   Medications: I have reviewed the patient's current medications.  Current Outpatient Medications  Medication Sig Dispense Refill   azithromycin (ZITHROMAX) 250 MG tablet Take 250 mg by mouth as directed.      dexlansoprazole (DEXILANT) 60 MG capsule Take 60 mg by mouth every morning.     famotidine (PEPCID) 20 MG tablet Take 20 mg by mouth 2 (two) times daily.      gabapentin (NEURONTIN) 800 MG tablet Take 800 mg by mouth 2 (two) times daily.     levothyroxine (SYNTHROID, LEVOTHROID) 50 MCG tablet Take 50 mcg by mouth daily.     Lifitegrast (XIIDRA) 5 % SOLN Apply to eye.     magnesium 30 MG tablet Take 30 mg by mouth 2 (two) times daily.     morphine (MSIR) 15 MG tablet morphine 15 mg immediate release tablet  Take 1 tablet 3 times a day by oral route as needed.     Multiple Vitamin (MULTIVITAMIN) tablet Take 1 tablet by mouth daily.     nortriptyline (PAMELOR) 10 MG capsule Take 1 capsule (10 mg total) by mouth at bedtime. 90 capsule 0   ondansetron (ZOFRAN-ODT) 8 MG disintegrating tablet Take 8 mg by mouth every 8 (eight) hours as needed for nausea.     Probiotic Product (PROBIOTIC DAILY PO) Take by mouth.     [START ON 06/18/2021] clonazePAM (KLONOPIN) 0.5 MG tablet Take 1 tablet in the morning and 1 tablet mid-day. 60 tablet 4   [START ON 05/21/2021] clonazePAM (KLONOPIN) 1 MG tablet Take 1 tablet (1 mg total) by mouth at bedtime. 30 tablet 5   cloNIDine (CATAPRES) 0.1 MG tablet Take 1 tab po QHS and 1 qd prn 180 tablet 0   levonorgestrel-ethinyl estradiol (SEASONALE,INTROVALE,JOLESSA) 0.15-0.03 MG tablet Take 1 tablet by mouth daily.     OLANZapine (ZYPREXA) 10 MG tablet Take 1 tablet (10 mg total) by mouth at bedtime. 90 tablet 0   No current facility-administered medications for this visit.    Medication Side Effects: Other: Increased wt, excessive daytime somnolence upon awakening.   Allergies:  Allergies  Allergen Reactions   Promethazine     Other reaction(s): Agitation   Ciprofloxacin Other (See Comments)    c-diff   Dilaudid [Hydromorphone]     sob   Epinephrine    Penicillins Hives    Has patient had a PCN reaction causing immediate rash, facial/tongue/throat swelling,  SOB or lightheadedness with hypotension: Yes Has patient had a PCN reaction causing severe rash involving mucus membranes or skin necrosis: No Has patient had a PCN reaction that required hospitalization: No Has patient had a PCN reaction occurring within the last 10 years: Yes If all of the above answers are "NO", then may proceed with Cephalosporin use.    Toradol [Ketorolac Tromethamine] Nausea And Vomiting    Past Medical History:  Diagnosis Date   Anorexia    at 51   Anxiety    Attention deficit disorder (ADD)    Bone spur    Depression    Eczema    HPV (human papilloma virus) infection     Family History  Problem Relation Age of Onset   Fibromyalgia Mother    Anxiety disorder Mother    Depression Mother    Depression Father    Anxiety disorder Father     Social History   Socioeconomic History   Marital status: Single  Spouse name: Not on file   Number of children: Not on file   Years of education: Not on file   Highest education level: Not on file  Occupational History   Not on file  Tobacco Use   Smoking status: Never   Smokeless tobacco: Never  Vaping Use   Vaping Use: Never used  Substance and Sexual Activity   Alcohol use: Yes    Comment: occasion   Drug use: No   Sexual activity: Never  Other Topics Concern   Not on file  Social History Narrative   Regular exercise: none   Caffeine use: daily   Social Determinants of Health   Financial Resource Strain: Not on file  Food Insecurity: Not on file  Transportation Needs: Not on file  Physical Activity: Not on file  Stress: Not on file  Social Connections: Not on file  Intimate Partner Violence: Not on file    Past Medical History, Surgical history, Social history, and Family history were reviewed and updated as appropriate.   Please see review of systems for further details on the patient's review from today.   Objective:   Physical Exam:  There were no vitals taken for this  visit.  Physical Exam Constitutional:      Appearance: She is ill-appearing.  Neurological:     Mental Status: She is alert and oriented to person, place, and time.     Cranial Nerves: No dysarthria.  Psychiatric:        Attention and Perception: Attention and perception normal.        Mood and Affect: Mood normal.        Speech: Speech normal.        Behavior: Behavior is cooperative.        Thought Content: Thought content normal. Thought content is not paranoid or delusional. Thought content does not include homicidal or suicidal ideation. Thought content does not include homicidal or suicidal plan.        Cognition and Memory: Cognition and memory normal.        Judgment: Judgment normal.     Comments: Insight intact    Lab Review:     Component Value Date/Time   NA 139 06/26/2017 0025   NA 138 08/31/2011 1624   K 3.7 06/26/2017 0025   K 4.1 08/31/2011 1624   CL 105 06/26/2017 0025   CL 102 08/31/2011 1624   CO2 25 06/26/2017 0025   CO2 27 08/31/2011 1624   GLUCOSE 110 (H) 06/26/2017 0025   GLUCOSE 81 08/31/2011 1624   BUN 9 06/26/2017 0025   BUN 10 08/31/2011 1624   CREATININE 0.88 06/26/2017 0025   CREATININE 0.88 08/31/2011 1624   CALCIUM 9.1 06/26/2017 0025   CALCIUM 9.1 08/31/2011 1624   PROT 7.4 06/26/2017 0025   PROT 7.8 08/31/2011 1624   ALBUMIN 4.5 06/26/2017 0025   ALBUMIN 4.0 08/31/2011 1624   AST 19 06/26/2017 0025   AST 25 08/31/2011 1624   ALT 14 06/26/2017 0025   ALT 24 08/31/2011 1624   ALKPHOS 49 06/26/2017 0025   ALKPHOS 52 08/31/2011 1624   BILITOT 0.5 06/26/2017 0025   BILITOT 0.3 08/31/2011 1624   GFRNONAA >60 06/26/2017 0025   GFRNONAA >60 08/31/2011 1624   GFRAA >60 06/26/2017 0025   GFRAA >60 08/31/2011 1624       Component Value Date/Time   WBC 6.4 06/26/2017 0025   RBC 3.85 06/26/2017 0025   HGB 12.1 06/26/2017 0025   HGB  12.7 08/31/2011 1624   HCT 34.8 (L) 06/26/2017 0025   HCT 37.7 08/31/2011 1624   PLT 246 06/26/2017  0025   PLT 248 08/31/2011 1624   MCV 90.3 06/26/2017 0025   MCV 90 08/31/2011 1624   MCH 31.5 06/26/2017 0025   MCHC 34.9 06/26/2017 0025   RDW 11.9 06/26/2017 0025   RDW 12.6 08/31/2011 1624   LYMPHSABS 2.2 04/07/2017 1145   MONOABS 0.4 04/07/2017 1145   EOSABS 0.1 04/07/2017 1145   BASOSABS 0.0 04/07/2017 1145    No results found for: POCLITH, LITHIUM   No results found for: PHENYTOIN, PHENOBARB, VALPROATE, CBMZ   .res Assessment: Plan:   Pt seen for 30 minutes and time spent discussing weight gain with Zyprexa. Discussed that wt gain with atypical antipsychotics typically is most pronounced during the first 2-3 months of treatment and then typically reaches a plateau and/or wt gain then slows significantly. Pt reports that she has noticed that she had initial weight gain and that rate of weight gain has slowed. She reports that at this time benefits outweigh side effects since her mood, anxiety, and insomnia have significantly improved with Olanzapine. Agreed to continue Olanzapine 10 mg po QHS for anxiety and will continue to monitor weight.  Continue Klonopin 0.5 mg po BID and 1 mg po QHS for anxiety and insomnia. Continue Clonidine 0.1 mg po QHS for anxiety and insomnia and 0.1 mg po qd prn anxiety.  Pt to follow-up with this provider in 3 months or sooner if clinically indicated.  Recommend continuing therapy with Stevphen Meuse, [facility]  Patient advised to contact office with any questions, adverse effects, or acute worsening in signs and symptoms.    Peggy Ruiz was seen today for follow-up.  Diagnoses and all orders for this visit:  PTSD (post-traumatic stress disorder) -     clonazePAM (KLONOPIN) 0.5 MG tablet; Take 1 tablet in the morning and 1 tablet mid-day. -     clonazePAM (KLONOPIN) 1 MG tablet; Take 1 tablet (1 mg total) by mouth at bedtime. -     cloNIDine (CATAPRES) 0.1 MG tablet; Take 1 tab po QHS and 1 qd prn  Generalized anxiety disorder -     clonazePAM (KLONOPIN)  0.5 MG tablet; Take 1 tablet in the morning and 1 tablet mid-day. -     clonazePAM (KLONOPIN) 1 MG tablet; Take 1 tablet (1 mg total) by mouth at bedtime. -     cloNIDine (CATAPRES) 0.1 MG tablet; Take 1 tab po QHS and 1 qd prn -     OLANZapine (ZYPREXA) 10 MG tablet; Take 1 tablet (10 mg total) by mouth at bedtime.  Insomnia, unspecified type -     OLANZapine (ZYPREXA) 10 MG tablet; Take 1 tablet (10 mg total) by mouth at bedtime.    Please see After Visit Summary for patient specific instructions.  Future Appointments  Date Time Provider Department Center  05/25/2021  3:00 PM Stevphen Meuse, [facility] CP-CP None  06/08/2021  2:00 PM Stevphen Meuse, [facility] CP-CP None  06/29/2021  2:00 PM Stevphen Meuse, [facility] CP-CP None  07/13/2021  2:00 PM Stevphen Meuse, [facility] CP-CP None  07/27/2021  2:00 PM Stevphen Meuse, [facility] CP-CP None    No orders of the defined types were placed in this encounter.     -------------------------------

## 2021-05-25 ENCOUNTER — Ambulatory Visit: Payer: PRIVATE HEALTH INSURANCE | Admitting: Psychiatry

## 2021-05-25 ENCOUNTER — Other Ambulatory Visit: Payer: Self-pay

## 2021-05-25 DIAGNOSIS — F4310 Post-traumatic stress disorder, unspecified: Secondary | ICD-10-CM | POA: Diagnosis not present

## 2021-05-25 NOTE — Progress Notes (Signed)
      [facility] Counselor/Therapist Progress Note  Patient ID: Peggy Ruiz, MRN: [id number],    Date: 05/25/2021  Time Spent: 58 minutes start time 3:03 end time 4:01 PM  Treatment Type: Individual Therapy  Reported Symptoms: triggered responses, upset, anxiety, panic, sadness, anger issues  Mental Status Exam:  Appearance:   Well Groomed     Behavior:  Appropriate  Motor:  Normal  Speech/Language:   Normal Rate  Affect:  Appropriate  Mood:  anxious  Thought process:  normal  Thought content:    WNL  Sensory/Perceptual disturbances:    WNL  Orientation:  oriented to person, place, time/date, and situation  Attention:  Good  Concentration:  Good  Memory:  WNL  Fund of knowledge:   Good  Insight:    Good  Judgment:   Good  Impulse Control:  Good   Risk Assessment: Danger to Self:  No Self-injurious Behavior: No Danger to Others: No Duty to Warn:no Physical Aggression / Violence:No  Access to Firearms a concern: No  Gang Involvement:No   Subjective: Patient was present for session.  She shared that she got upset over Thanksgiving but she was able to use her tools and calm herself down which was huge progress for her.  She shared that her husband's work is the 1 thing that she has not been able to work through. She shared that he is having to work most Sundays and he is often called back to the office before he even gets home and it is overwhelming for her.She shared that she is using the tools from other sessions and that has helped with him getting in later but it seems whenever they have plans his work takes him away.  Patient did processing set on husband being called in to work when there are plans, suds level 10, negative cognition "cannot handle it" felt anger and anxiety in her chest arms and hands.  Patient was able to reduce suds level to 3.  She was able to feel better at the end of session.  She was able to also think through the reasons she gets so agitated and  develop plans to help manage her emotions appropriately.  Interventions: Cognitive Behavioral Therapy, Solution-Oriented/Positive Psychology, Insight-Oriented, and BS P  Diagnosis:   ICD-10-CM   1. Posttraumatic stress disorder  F43.10       Plan: Patient is to use CBT and coping skills to decrease triggered responses.  Patient is to work on handling the things that she can control fix and change.  Patient is to continue working on Curator.  Patient is to take medication as directed. Long-term goal: Interact normally with friends and family without irrational fears or intrusive thoughts that control behavior.  Display a full range of emotions without experiencing loss of control Short-term goal: Identify and replace negative self talk and catastrophic sizing that is associated with past trauma and current stimuli triggers for anxiety  Stevphen Meuse, [facility]

## 2021-06-08 ENCOUNTER — Other Ambulatory Visit: Payer: Self-pay

## 2021-06-08 ENCOUNTER — Ambulatory Visit: Payer: PRIVATE HEALTH INSURANCE | Admitting: Psychiatry

## 2021-06-08 DIAGNOSIS — F4310 Post-traumatic stress disorder, unspecified: Secondary | ICD-10-CM

## 2021-06-08 NOTE — Progress Notes (Signed)
[facility] Counselor/Therapist Progress Note  Patient ID: Peggy Ruiz, MRN: [id number],    Date: 06/08/2021  Time Spent: 58 minutes start time 2:05 PM end time 3:03 PM  Treatment Type: Individual Therapy  Reported Symptoms: depression, chronic pain, anxiety, triggered responses, fatigue, irritability, crying spells, mood swings, rumination  Mental Status Exam:  Appearance:   Well Groomed     Behavior:  Appropriate  Motor:  Normal  Speech/Language:   Normal Rate  Affect:  Congruent  Mood:  anxious and sad  Thought process:  normal  Thought content:    WNL  Sensory/Perceptual disturbances:    WNL  Orientation:  oriented to person, place, time/date, and situation  Attention:  Good  Concentration:  Good  Memory:  WNL  Fund of knowledge:   Good  Insight:    Good  Judgment:   Good  Impulse Control:  Good   Risk Assessment: Danger to Self:  No Self-injurious Behavior: No Danger to Others: No Duty to Warn:no Physical Aggression / Violence:No  Access to Firearms a concern: No  Gang Involvement:No   Subjective: Patient was present for session.  She shared that she has been having medical issues and went to a new OBGYN and a urologist and things did not go well.She shared she is fatigued in pain and not willing to go to another doctor for the issues.  She shared that she has tried so much to get better and nothing has helped. She shared she has been trying to get better for 7 years and she can't do it anymore.  Patient was encouraged to think through what was the biggest issue she needed to address in session.  She shared that at this point it is Cody's work because it is so demanding and she is not getting time with him.  Patient did processing set on that issue suds level 10, negative cognition "I am not safe" felt anxiety in her chest and stomach.  Patient was able to reduce suds level to 3.  Through the processing she was able to recognize that there is a lot of fear  behind what is being triggered, the fear of losing her husband, the fear of him not being able to work due to the toll the job is taking on his body, the fear of being alone and something bad happening at night.  Patient was encouraged to try and remind herself of the truth that things are okay at this time and she needs to focus on what she can control fix and change.  Patient is to take things 1 step at a time and trying to focus on the positives and start writing those things down again in her journal.  Interventions: Cognitive Behavioral Therapy, Solution-Oriented/Positive Psychology, Insight-Oriented, and BS P  Diagnosis:   ICD-10-CM   1. Posttraumatic stress disorder  F43.10       Plan: Patient is to use CBT and coping skills to decrease triggered responses.  Patient is to focus on the things that she can control fix and change.  Patient is to write down positive things in her journal or things that have worked out positively to read as needed.  Patient is to continue working with providers on medical issues.  Patient is to take medication as directed. Long-term goal: Interact normally with friends and family without irrational fears or intrusive thoughts that control behavior.  Display a full range of emotions without experiencing loss of control Short-term goal: Identify and  replace negative self talk and catastrophic sizing that is associated with past trauma and current stimuli triggers for anxiety  Stevphen Meuse, [facility]

## 2021-06-29 ENCOUNTER — Other Ambulatory Visit: Payer: Self-pay

## 2021-06-29 ENCOUNTER — Ambulatory Visit: Payer: PRIVATE HEALTH INSURANCE | Admitting: Psychiatry

## 2021-06-29 VITALS — Wt 155.0 lb

## 2021-06-29 DIAGNOSIS — F39 Unspecified mood [affective] disorder: Secondary | ICD-10-CM

## 2021-06-29 DIAGNOSIS — F4310 Post-traumatic stress disorder, unspecified: Secondary | ICD-10-CM | POA: Diagnosis not present

## 2021-06-29 DIAGNOSIS — F411 Generalized anxiety disorder: Secondary | ICD-10-CM

## 2021-06-29 MED ORDER — BUPROPION HCL ER (XL) 150 MG PO TB24: 150.0000 mg | ORAL_TABLET | Freq: Every day | ORAL | 1 refills | Status: DC

## 2021-06-29 MED ORDER — CLONAZEPAM 0.5 MG PO TABS: 0.5000 mg | ORAL_TABLET | Freq: Three times a day (TID) | ORAL | 2 refills | Status: DC

## 2021-06-29 NOTE — Progress Notes (Signed)
Peggy Ruiz [id number] Sep 01, 1985 36 y.o.  Subjective:   Patient ID:  Peggy Ruiz is a 36 y.o. (DOB [birth year]) female.  Chief Complaint:  Chief Complaint  Patient presents with   Anxiety    Anxiety    Peggy Ruiz presents to the office today for emergent follow-up after reporting worsening s/s and possible side effects to therapist. She reports that she has been having severe anxiety and panic attacks. She reports "the last 3-4 weeks have been some of the worst weeks I have had." Reports feeling shaky and easily overwhelmed. She reports slapping herself forcefully and has had thoughts of self-harm and restricting food intake. She reports that "these are thoughts that I have not had in a very long time." She reports that she is having weight gain. She reports that her motivation has been low and it has been difficult to get up in the morning. She reports that her energy has been low. Depressed mood. Easily irritated. Sleeping well. Concentration has been ok.   Has had worsening back pain for the past month and has apt to see back specialist. 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   "I'm tired of fighting." Denies SI.   She has been trying different supplements and is wondering if supplements may be interacting with supplements. She reports taking multiple herbal supplements and one contains 5-HTP. One contains caffeine. Stopped taking supplement that may help with wt loss and hormonal support a few days ago.   Past Psychiatric Medication Trials: Cymbalta- Effective for a period of time and then no longer as effective.  Prozac Lexapro Paxil- Initially helped and then did not seem to be as effective when used without Zyprexa Effexor Nortriptyline-prescribed by GI for gastroparesis Remeron-caused insomnia Buspar- Adverse reaction Abilify-involuntary movements Zyprexa-effective and took for about 14 years.  Caused excessive weight gain at  times. Seroquel Latuda-effective. Sleeping "harder" on 80 mg. Lamictal Gabapentin- Cannot tolerate doses higher than 800 mg BID Trileptal- Reports that she was unable to tolerate 900 mg QHS Belsomra-helpful with sleep quality but not sleep quantity. Klonopin Ativan Librium Clonidine- Effective Propranolol Trazodone Ambien Nuvigil Adderall Deplin  AIMS    Flowsheet Row Office Visit from 02/26/2021 in [facility] Office Visit from 12/22/2020 in [facility] Office Visit from 09/02/2020 in [facility] Office Visit from 02/21/2020 in [facility] Office Visit from 02/25/2019 in [facility]  AIMS Total Score 0 0 0 0 0        Review of Systems:  Review of Systems  Musculoskeletal:  Positive for back pain. Negative for gait problem.  Neurological:  Negative for tremors.  Psychiatric/Behavioral:         Please refer to HPI   Medications: I have reviewed the patient's current medications.  Current Outpatient Medications  Medication Sig Dispense Refill   APPLE CIDER VINEGAR PO Take by mouth.     buPROPion (WELLBUTRIN XL) 150 MG 24 hr tablet Take 1 tablet (150 mg total) by mouth daily. 30 tablet 1   TURMERIC PO Take by mouth.     azithromycin (ZITHROMAX) 250 MG tablet Take 250 mg by mouth as directed.     [START ON 07/08/2021] clonazePAM (KLONOPIN) 0.5 MG tablet Take 1 tablet (0.5 mg total) by mouth 3 (three) times daily. Take 1 mg tab at bedtime 90 tablet 2   clonazePAM (KLONOPIN) 1 MG tablet Take 1 tablet (1 mg total) by mouth at bedtime. 30 tablet 5  cloNIDine (CATAPRES) 0.1 MG tablet Take 1 tab po QHS and 1 qd prn 180 tablet 0   dexlansoprazole (DEXILANT) 60 MG capsule Take 60 mg by mouth every morning.     famotidine (PEPCID) 20 MG tablet Take 20 mg by mouth 2 (two) times daily.      gabapentin (NEURONTIN) 800 MG tablet Take 800 mg by mouth 2 (two) times daily.     levonorgestrel-ethinyl estradiol  (SEASONALE,INTROVALE,JOLESSA) 0.15-0.03 MG tablet Take 1 tablet by mouth daily.     levothyroxine (SYNTHROID, LEVOTHROID) 50 MCG tablet Take 50 mcg by mouth daily.     Lifitegrast (XIIDRA) 5 % SOLN Apply to eye.     magnesium 30 MG tablet Take 30 mg by mouth 2 (two) times daily.     morphine (MSIR) 15 MG tablet morphine 15 mg immediate release tablet  Take 1 tablet 3 times a day by oral route as needed.     Multiple Vitamin (MULTIVITAMIN) tablet Take 1 tablet by mouth daily.     nortriptyline (PAMELOR) 10 MG capsule Take 1 capsule (10 mg total) by mouth at bedtime. 90 capsule 0   OLANZapine (ZYPREXA) 10 MG tablet Take 1 tablet (10 mg total) by mouth at bedtime. 90 tablet 0   ondansetron (ZOFRAN-ODT) 8 MG disintegrating tablet Take 8 mg by mouth every 8 (eight) hours as needed for nausea.     Probiotic Product (PROBIOTIC DAILY PO) Take by mouth.     No current facility-administered medications for this visit.    Medication Side Effects: Other: Wt gain  Allergies:  Allergies  Allergen Reactions   Promethazine     Other reaction(s): Agitation   Ciprofloxacin Other (See Comments)    c-diff   Dilaudid [Hydromorphone]     sob   Epinephrine    Penicillins Hives    Has patient had a PCN reaction causing immediate rash, facial/tongue/throat swelling, SOB or lightheadedness with hypotension: Yes Has patient had a PCN reaction causing severe rash involving mucus membranes or skin necrosis: No Has patient had a PCN reaction that required hospitalization: No Has patient had a PCN reaction occurring within the last 10 years: Yes If all of the above answers are "NO", then may proceed with Cephalosporin use.    Toradol [Ketorolac Tromethamine] Nausea And Vomiting    Past Medical History:  Diagnosis Date   Anorexia    at [age]   Anxiety    Attention deficit disorder (ADD)    Bone spur    Depression    Eczema    HPV (human papilloma virus) infection     Past Medical History, Surgical  history, Social history, and Family history were reviewed and updated as appropriate.   Please see review of systems for further details on the patient's review from today.   Objective:   Physical Exam:  Wt 155 lb (70.3 kg)    BMI 22.89 kg/m   Physical Exam Constitutional:      General: She is not in acute distress. Musculoskeletal:        General: No deformity.  Neurological:     Mental Status: She is alert and oriented to person, place, and time.     Coordination: Coordination normal.  Psychiatric:        Attention and Perception: Attention and perception normal. She does not perceive auditory or visual hallucinations.        Mood and Affect: Mood is anxious and depressed. Affect is not labile, blunt, angry or inappropriate.  Speech: Speech normal.        Behavior: Behavior normal.        Thought Content: Thought content normal. Thought content is not paranoid or delusional. Thought content does not include homicidal or suicidal ideation. Thought content does not include homicidal or suicidal plan.        Cognition and Memory: Cognition and memory normal.        Judgment: Judgment normal.     Comments: Insight intact    Lab Review:     Component Value Date/Time   NA 139 06/26/2017 0025   NA 138 08/31/2011 1624   K 3.7 06/26/2017 0025   K 4.1 08/31/2011 1624   CL 105 06/26/2017 0025   CL 102 08/31/2011 1624   CO2 25 06/26/2017 0025   CO2 27 08/31/2011 1624   GLUCOSE 110 (H) 06/26/2017 0025   GLUCOSE 81 08/31/2011 1624   BUN 9 06/26/2017 0025   BUN 10 08/31/2011 1624   CREATININE 0.88 06/26/2017 0025   CREATININE 0.88 08/31/2011 1624   CALCIUM 9.1 06/26/2017 0025   CALCIUM 9.1 08/31/2011 1624   PROT 7.4 06/26/2017 0025   PROT 7.8 08/31/2011 1624   ALBUMIN 4.5 06/26/2017 0025   ALBUMIN 4.0 08/31/2011 1624   AST 19 06/26/2017 0025   AST 25 08/31/2011 1624   ALT 14 06/26/2017 0025   ALT 24 08/31/2011 1624   ALKPHOS 49 06/26/2017 0025   ALKPHOS 52 08/31/2011  1624   BILITOT 0.5 06/26/2017 0025   BILITOT 0.3 08/31/2011 1624   GFRNONAA >60 06/26/2017 0025   GFRNONAA >60 08/31/2011 1624   GFRAA >60 06/26/2017 0025   GFRAA >60 08/31/2011 1624       Component Value Date/Time   WBC 6.4 06/26/2017 0025   RBC 3.85 06/26/2017 0025   HGB 12.1 06/26/2017 0025   HGB 12.7 08/31/2011 1624   HCT 34.8 (L) 06/26/2017 0025   HCT 37.7 08/31/2011 1624   PLT 246 06/26/2017 0025   PLT 248 08/31/2011 1624   MCV 90.3 06/26/2017 0025   MCV 90 08/31/2011 1624   MCH 31.5 06/26/2017 0025   MCHC 34.9 06/26/2017 0025   RDW 11.9 06/26/2017 0025   RDW 12.6 08/31/2011 1624   LYMPHSABS 2.2 04/07/2017 1145   MONOABS 0.4 04/07/2017 1145   EOSABS 0.1 04/07/2017 1145   BASOSABS 0.0 04/07/2017 1145    No results found for: POCLITH, LITHIUM   No results found for: PHENYTOIN, PHENOBARB, VALPROATE, CBMZ   .res Assessment: Plan:   Pt seen for 30 minutes and time spent discussing recent worsening in mood and anxiety s/s. She reports that worsening s/s may have coincided with initiation of supplements that she had started to potentially improve energy, alertness, help with weight loss, and hormone balance. Recommended stopping supplements that have stimulating effects since this could exacerbate anxiety. Discussed that St. John's wort could increase Serotonin levels and supplements that elevate testosterone could cause irritability. Pt reports that she will bring in list of supplements for review. Discussed that turmeric, fish oil, and vitamins should be safe to combine with current medications. Recommend N-Acetyl Cysteine (NAC) 600 mg twice daily for compulsive behaviors.  Will increase Klonopin to 0.5 mg three times daily in addition to continuing Klonopin 1 mg po QHS. Pt reports that she recently filled Klonopin 0.5 mg script. New script sent with fill date of 07/08/21 since she will increase frequency of Klonopin using script picked up recently and will therefore run out  before 4 weeks.  Discussed potential benefits, risks, and side effects of Wellbutrin XL. Discussed that it targets many of the s/s that she was hopeful that supplements would assist with. Pt agrees to trial of Wellbutrin XL 150 mg po q am for depression and to improve energy and motivation. Discussed that Wellbutrin XL may also reduce food cravings. Advised waiting approximately 4-5 days before starting Wellbutrin XL to allow time for increase in Klonopin to stabilize anxiety and for increased activation from supplements to wash out.  Continue Olanzapine 10 mg po QHS for mood, anxiety, and insomnia.  Continue Clonidine 0.1 mg po QHS and 1/2-1 tab prn anxiety.  Recommend continuing therapy with Ste[facility]phen MeuseHolly Ingram, [facility].  Pt to follow-up in 6 weeks as scheduled or sooner if clinically indicated.  Patient advised to contact office with any questions, adverse effects, or acute worsening in signs and symptoms.   Peggy Ruiz was seen today for anxiety.  Diagnoses and all orders for this visit:  Mild mood disorder (HCC) -     buPROPion (WELLBUTRIN XL) 150 MG 24 hr tablet; Take 1 tablet (150 mg total) by mouth daily.  PTSD (post-traumatic stress disorder) -     clonazePAM (KLONOPIN) 0.5 MG tablet; Take 1 tablet (0.5 mg total) by mouth 3 (three) times daily. Take 1 mg tab at bedtime  Generalized anxiety disorder -     clonazePAM (KLONOPIN) 0.5 MG tablet; Take 1 tablet (0.5 mg total) by mouth 3 (three) times daily. Take 1 mg tab at bedtime     Please see After Visit Summary for patient specific instructions.  Future Appointments  Date Time Provider Department Center  07/13/2021  2:00 PM Stevphen Meusengram, Holly, [facility] None  07/27/2021  2:00 PM Stevphen Meusengram, Holly, [facility] None  08/10/2021  2:00 PM Stevphen Meusengram, Holly, [facility] None  08/10/2021  4:00 PM Corie Chiquitoarter, Karel Mowers, PMHNP CP-CP None  08/24/2021  2:00 PM Stevphen Meusengram, Holly[facility] None  09/07/2021  2:00 PM Stevphen Meusengram, Holly[facility] None  09/21/2021  2:00 PM Stevphen MeuseIngram,  Holly, [facility] None    No orders of the defined types were placed in this encounter.   -------------------------------

## 2021-06-29 NOTE — Progress Notes (Signed)
°      [facility] Counselor/Therapist Progress Note  Patient ID: SHANDEL BUSIC, MRN: [id number],    Date: 06/29/2021  Time Spent: 55 minutes start time 2:05 PM end time 3 PM  Treatment Type: Individual Therapy  Reported Symptoms: panic attacks, irritability, intrusive thoughts, sadness, self harm, chronic pain, weight gain, crying spells  Mental Status Exam:  Appearance:   Casual and Neat     Behavior:  Appropriate  Motor:  Normal  Speech/Language:   Pressured  Affect:  Appropriate  Mood:  anxious and irritable  Thought process:  normal  Thought content:    WNL  Sensory/Perceptual disturbances:    WNL  Orientation:  oriented to person, place, time/date, and situation  Attention:  Good  Concentration:  Good  Memory:  WNL  Fund of knowledge:   Fair  Insight:    Good  Judgment:   Good  Impulse Control:  Fair   Risk Assessment: Danger to Self:  No Self-injurious Behavior:  not current but had episode of hitting self Danger to Others: No Duty to Warn:no Physical Aggression / Violence:No  Access to Firearms a concern: No  Gang Involvement:No   Subjective: Patient was present for session.  She shared that she has noticed a change in her mood over the past 5 weeks.  She stated she is not sure what is causing the issues.  She shared she was doing better than her period started and she has been emotional since that time.  Patient also shared that she had noticed a correlation with her mood and taking certain supplements when asked if she had talked to her provider Corie Chiquito PMHNP prior to taking them she stated she had not.  Encouraged her to talk to her about what she had taken to make sure there was no potential interaction action with her medications.  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  Patient did processing set on an incident that happened that was triggering for her, for cinnamon rolls, suds level 9, negative  cognition "bad things happen to me" felt anxiety and anger in her chest and shoulders.  Patient was able to realize she is struggling with feeling that she is never going to get better and there is no hope.  Discussed the importance of her realizing that she has made progress in treatment and that some of the current issues may have nothing to do with what is taking place in treatment.  Discussed the different positive changes she has made in time together.  Encouraged her to work on self talk reminding herself that she still has value and worth even though she cannot do the things that she would like to do.  Interventions: Cognitive Behavioral Therapy, Solution-Oriented/Positive Psychology, Insight-Oriented, and BS P  Diagnosis:   ICD-10-CM   1. Posttraumatic stress disorder  F43.10       Plan: Patient is to use CBT and coping skills to decrease triggered responses.  Patient is to consult with her provider Corie Chiquito PMHNP about her supplements and medication.  Patient is to take medication as directed.  Patient is to work on focusing on the things that she can accomplish.  Stevphen Meuse, [facility]

## 2021-06-30 ENCOUNTER — Encounter: Payer: Self-pay | Admitting: Psychiatry

## 2021-07-13 ENCOUNTER — Ambulatory Visit: Payer: PRIVATE HEALTH INSURANCE | Admitting: Psychiatry

## 2021-07-27 ENCOUNTER — Other Ambulatory Visit: Payer: Self-pay

## 2021-07-27 ENCOUNTER — Ambulatory Visit: Payer: PRIVATE HEALTH INSURANCE | Admitting: Psychiatry

## 2021-07-27 DIAGNOSIS — F4310 Post-traumatic stress disorder, unspecified: Secondary | ICD-10-CM | POA: Diagnosis not present

## 2021-07-27 NOTE — Progress Notes (Signed)
°      [facility] Counselor[facility]Therap[facility] Progress Note  Patient ID: Peggy Ruiz, MRN: [id number],    Date: 07/27/2021  Time Spent: 55 minutes start time 2:04 PM end time 2:59 PM  Treatment Type: Individual Therapy  Reported Symptoms: anxiety, fatigue, chronic pain issues, only 1 panic issue, sadness, triggered issues, crying spells  Mental Status Exam:  Appearance:   Well Groomed     Behavior:  Appropriate  Motor:  Normal  Speech/Language:   Normal Rate  Affect:  Appropriate  Mood:  anxious  Thought process:  normal  Thought content:    WNL  Sensory/Perceptual disturbances:    WNL  Orientation:  oriented to person, place, time/date, and situation  Attention:  Good  Concentration:  Good  Memory:  WNL  Fund of knowledge:   Good  Insight:    Good  Judgment:   Good  Impulse Control:  Good   Risk Assessment: Danger to Self:  No Self-injurious Behavior: No Danger to Others: No Duty to Warn:no Physical Aggression / Violence:No  Access to Firearms a concern: No  Gang Involvement:No   Subjective: Patient was present for session.  She shared that med changes are starting to help, which is good.  She shared she is doing better with issues from her past.  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  Patient did processing set on not being able to vacuum, suds level 8, negative cognition "I am not good enough" felt sadness frustration and anger in her chest and shoulders.  Patient was able to reduce suds level to 3.  Discussed the importance of her talking herself through what she can and cannot do.  Discussed putting affirmations on her  marriage to repeat to herself regularly to help change thought patterns.  Interventions: Cognitive Behavioral Therapy, Solution-Oriented/Positive Psychology, Insight-Oriented, and BS P  Diagnosis:   ICD-10-CM   1. Posttraumatic stress disorder  F43.10       Plan: Patient is to use CBT and coping skills to decrease triggered responses.  Patient is to work on Psychologist, sport and exercise on her mirror to repeat regularly.  Patient is to take things in small steps so that she is able to accomplish tasks.  Patient is to take medication as directed. Patient is to use CBT and coping skills to decrease triggered responses.  Patient is to consult with her provider Corie Chiquito PMHNP about her supplements and medication.  Patient is to take medication as directed.  Patient is to work on focusing on the things that she can accomplish. Long-term goal: Interact normally with friends and family without irrational fears or intrusive thoughts that control behavior.  Display a full range of emotions without experiencing loss of control Short-term goal: Identify and replace negative self talk and catastrophic sizing that is associated with past trauma and current stimuli triggers for anxiety  Stevphen Meuse, [facility]

## 2021-08-10 ENCOUNTER — Encounter: Payer: Self-pay | Admitting: Psychiatry

## 2021-08-10 ENCOUNTER — Other Ambulatory Visit: Payer: Self-pay

## 2021-08-10 ENCOUNTER — Ambulatory Visit: Payer: PRIVATE HEALTH INSURANCE | Admitting: Psychiatry

## 2021-08-10 DIAGNOSIS — F39 Unspecified mood [affective] disorder: Secondary | ICD-10-CM | POA: Diagnosis not present

## 2021-08-10 DIAGNOSIS — G4700 Insomnia, unspecified: Secondary | ICD-10-CM

## 2021-08-10 DIAGNOSIS — F4310 Post-traumatic stress disorder, unspecified: Secondary | ICD-10-CM | POA: Diagnosis not present

## 2021-08-10 DIAGNOSIS — F411 Generalized anxiety disorder: Secondary | ICD-10-CM

## 2021-08-10 MED ORDER — BUPROPION HCL ER (XL) 150 MG PO TB24: 150.0000 mg | ORAL_TABLET | Freq: Every day | ORAL | 0 refills | Status: DC

## 2021-08-10 MED ORDER — METFORMIN HCL ER 500 MG PO TB24: ORAL_TABLET | ORAL | 2 refills | Status: DC

## 2021-08-10 MED ORDER — OLANZAPINE 10 MG PO TABS: 10.0000 mg | ORAL_TABLET | Freq: Every day | ORAL | 1 refills | Status: DC

## 2021-08-10 NOTE — Progress Notes (Signed)
°      [facility] Counselor/Therapist Progress Note  Patient ID: Peggy Ruiz, MRN: [id number],    Date: 08/10/2021  Time Spent: 52 minutes start time 2:07 PM end time 2:59 PM  Treatment Type: Individual Therapy  Reported Symptoms: fatigue, crying spells, anxiety, panic, sadness, triggered responses, catastrophic thinking, flashback, rumination  Mental Status Exam:  Appearance:   Well Groomed     Behavior:  Appropriate  Motor:  Normal  Speech/Language:   Normal Rate  Affect:  Appropriate  Mood:  anxious  Thought process:  normal  Thought content:    WNL  Sensory/Perceptual disturbances:    WNL  Orientation:  oriented to person, place, time/date, and situation  Attention:  Good  Concentration:  Good  Memory:  WNL  Fund of knowledge:   Good  Insight:    Good  Judgment:   Good  Impulse Control:  Good   Risk Assessment: Danger to Self:  No Self-injurious Behavior: No Danger to Others: No Duty to Warn:no Physical Aggression / Violence:No  Access to Firearms a concern: No  Gang Involvement:No   Subjective: Patient was present for session.  She reported she is feeling better but fatigue is bad.  She is still having moments of high anxiety. She went on to share she is having moments when she is able to talk herself through a situation rationally. She shared that her dad called late at night and she could hear her mother crying because Aaron Edelman told mom that the people in the house that he is staying threatened him.  Patient did processing set on the phone call about Aaron Edelman, suds level 10, negative cognition "I have to fix it" felt panic in her chest.  Patient was able to reduce suds level to 4.  She was able to recognize that she can support her mother but she may have to set some limits and asked them not to call at night concerning things that she cannot do anything about.  Ways to help her have that conversation were discussed with patient.  Interventions: Cognitive Behavioral  Therapy, Solution-Oriented/Positive Psychology, and BS P  Diagnosis:   ICD-10-CM   1. Posttraumatic stress disorder  F43.10       Plan: Patient is to use CBT and coping skills to decrease triggered responses.  Patient is to work on Advertising copywriter on her mirror to repeat regularly.  Patient is to take things in small steps so that she is able to accomplish tasks.  Patient is to take medication as directed. Patient is to use CBT and coping skills to decrease triggered responses.  Patient is to consult with her provider Thayer Headings PMHNP about her supplements and medication.  Patient is to work on limit setting with her parents.  Patient is to work on focusing on the things that she can accomplish. Long-term goal: Interact normally with friends and family without irrational fears or intrusive thoughts that control behavior.  Display a full range of emotions without experiencing loss of control Short-term goal: Identify and replace negative self talk and catastrophic sizing that is associated with past trauma and current stimuli triggers for anxiety    Lina Sayre, [facility]

## 2021-08-10 NOTE — Progress Notes (Signed)
Peggy Ruiz [id number] Nov 17, 1985 36 y.o.  Subjective:   Patient ID:  Peggy Ruiz is a 36 y.o. (DOB [birth year]) female.  Chief Complaint:  Chief Complaint  Patient presents with   Follow-up    Anxiety, depression, insomnia    HPI Laianna C Machamer presents to the office today for follow-up of anxiety, depression, and insomnia. She reports improved depression and anxiety since last visit.   She reports that she stopped all supplements that she was previously taking. She reports that she has been "extremely tired." She reports that increase in Klonopin seems to be causing some fatigue.  She reports, "I have felt better as far as my depression and thought pattern." She notices some periods of sadness. She reports sadness in response to physical limitations. Denies irritability. She reports that her appetite has been decreased. She reports that she is eating less and weight is increasing. She reports that her motivation has been ok.  Concentration has been good. She reports that she has been waking up once a night for the last 2 weeks and is able to return to sleep within 10-15 minutes. Feels that she is not in as deep of sleep and awakens to different sounds. Denies SI.   She reports that she continues to have periods of anxiety. She reports that she had some PTSD s/s after family called at 10 pm to discuss a recent stressor. She reports that she has been able to talk herself through some stressful events and other times things will cause her stress.   She reports that she has been gaining weight and her clothes are fitting differently.   Past Psychiatric Medication Trials: Cymbalta- Effective for a period of time and then no longer as effective.  Prozac Lexapro Paxil- Initially helped and then did not seem to be as effective when used without Zyprexa Effexor Nortriptyline-prescribed by GI for gastroparesis Remeron-caused insomnia Buspar- Adverse reaction Abilify-involuntary  movements Zyprexa-effective and took for about 14 years.  Caused excessive weight gain at times. Seroquel Latuda-effective. Sleeping "harder" on 80 mg. Lamictal Gabapentin- Cannot tolerate doses higher than 800 mg BID Trileptal- Reports that she was unable to tolerate 900 mg QHS Belsomra-helpful with sleep quality but not sleep quantity. Klonopin Ativan Librium Clonidine- Effective Propranolol Trazodone Ambien Nuvigil Adderall Deplin  AIMS    Flowsheet Row Office Visit from 02/26/2021 in [facility] Office Visit from 12/22/2020 in [facility] Office Visit from 09/02/2020 in [facility] Office Visit from 02/21/2020 in [facility] Office Visit from 02/25/2019 in [facility]  AIMS Total Score 0 0 0 0 0        Review of Systems:  Review of Systems  Gastrointestinal:        Reports GI s/s are consistent with baseline  Musculoskeletal:  Positive for back pain. Negative for gait problem.  Neurological:  Negative for tremors.  Psychiatric/Behavioral:         Please refer to HPI   Medications: I have reviewed the patient's current medications.  Current Outpatient Medications  Medication Sig Dispense Refill   clonazePAM (KLONOPIN) 0.5 MG tablet Take 1 tablet (0.5 mg total) by mouth 3 (three) times daily. Take 1 mg tab at bedtime 90 tablet 2   cloNIDine (CATAPRES) 0.1 MG tablet Take 1 tab po QHS and 1 qd prn 180 tablet 0   dexlansoprazole (DEXILANT) 60 MG capsule Take 60 mg by mouth every morning.     famotidine (PEPCID) 20 MG tablet Take  20 mg by mouth 2 (two) times daily.      gabapentin (NEURONTIN) 800 MG tablet Take 800 mg by mouth 2 (two) times daily.     levothyroxine (SYNTHROID, LEVOTHROID) 50 MCG tablet Take 50 mcg by mouth daily.     Lifitegrast (XIIDRA) 5 % SOLN Apply to eye.     magnesium 30 MG tablet Take 30 mg by mouth 2 (two) times daily.     metFORMIN (GLUCOPHAGE-XR) 500 MG 24 hr tablet  Take 1 tablet daily for one week, then increase to 1 tablet twice daily 60 tablet 2   morphine (MSIR) 15 MG tablet morphine 15 mg immediate release tablet  Take 1 tablet 3 times a day by oral route as needed.     Multiple Vitamin (MULTIVITAMIN) tablet Take 1 tablet by mouth daily.     nortriptyline (PAMELOR) 10 MG capsule Take 1 capsule (10 mg total) by mouth at bedtime. 90 capsule 0   ondansetron (ZOFRAN-ODT) 8 MG disintegrating tablet Take 8 mg by mouth every 8 (eight) hours as needed for nausea.     buPROPion (WELLBUTRIN XL) 150 MG 24 hr tablet Take 1 tablet (150 mg total) by mouth daily. 90 tablet 0   clonazePAM (KLONOPIN) 1 MG tablet Take 1 tablet (1 mg total) by mouth at bedtime. 30 tablet 5   levonorgestrel-ethinyl estradiol (SEASONALE,INTROVALE,JOLESSA) 0.15-0.03 MG tablet Take 1 tablet by mouth daily.     OLANZapine (ZYPREXA) 10 MG tablet Take 1 tablet (10 mg total) by mouth at bedtime. 90 tablet 1   Probiotic Product (PROBIOTIC DAILY PO) Take by mouth. (Patient not taking: Reported on 08/10/2021)     TURMERIC PO Take by mouth. (Patient not taking: Reported on 08/10/2021)     No current facility-administered medications for this visit.    Medication Side Effects: Other: Decreased appetite, possible sleep disturbance  Allergies:  Allergies  Allergen Reactions   Promethazine     Other reaction(s): Agitation   Ciprofloxacin Other (See Comments)    c-diff   Dilaudid [Hydromorphone]     sob   Epinephrine    Penicillins Hives    Has patient had a PCN reaction causing immediate rash, facial/tongue/throat swelling, SOB or lightheadedness with hypotension: Yes Has patient had a PCN reaction causing severe rash involving mucus membranes or skin necrosis: No Has patient had a PCN reaction that required hospitalization: No Has patient had a PCN reaction occurring within the last 10 years: Yes If all of the above answers are "NO", then may proceed with Cephalosporin use.    Toradol  [Ketorolac Tromethamine] Nausea And Vomiting    Past Medical History:  Diagnosis Date   Anorexia    at 32   Anxiety    Attention deficit disorder (ADD)    Bone spur    Depression    Eczema    HPV (human papilloma virus) infection     Past Medical History, Surgical history, Social history, and Family history were reviewed and updated as appropriate.   Please see review of systems for further details on the patient's review from today.   Objective:   Physical Exam:  Wt 160 lb (72.6 kg)    BMI 23.63 kg/m   Physical Exam Constitutional:      General: She is not in acute distress. Musculoskeletal:        General: No deformity.  Neurological:     Mental Status: She is alert and oriented to person, place, and time.     Coordination: Coordination  normal.  Psychiatric:        Attention and Perception: Attention and perception normal. She does not perceive auditory or visual hallucinations.        Mood and Affect: Affect is not labile, blunt, angry or inappropriate.        Speech: Speech normal.        Behavior: Behavior normal.        Thought Content: Thought content normal. Thought content is not paranoid or delusional. Thought content does not include homicidal or suicidal ideation. Thought content does not include homicidal or suicidal plan.        Cognition and Memory: Cognition and memory normal.        Judgment: Judgment normal.     Comments: Insight intact Mood presents as less depressed and less anxious compared to last exam    Lab Review:     Component Value Date/Time   NA 139 06/26/2017 0025   NA 138 08/31/2011 1624   K 3.7 06/26/2017 0025   K 4.1 08/31/2011 1624   CL 105 06/26/2017 0025   CL 102 08/31/2011 1624   CO2 25 06/26/2017 0025   CO2 27 08/31/2011 1624   GLUCOSE 110 (H) 06/26/2017 0025   GLUCOSE 81 08/31/2011 1624   BUN 9 06/26/2017 0025   BUN 10 08/31/2011 1624   CREATININE 0.88 06/26/2017 0025   CREATININE 0.88 08/31/2011 1624   CALCIUM 9.1  06/26/2017 0025   CALCIUM 9.1 08/31/2011 1624   PROT 7.4 06/26/2017 0025   PROT 7.8 08/31/2011 1624   ALBUMIN 4.5 06/26/2017 0025   ALBUMIN 4.0 08/31/2011 1624   AST 19 06/26/2017 0025   AST 25 08/31/2011 1624   ALT 14 06/26/2017 0025   ALT 24 08/31/2011 1624   ALKPHOS 49 06/26/2017 0025   ALKPHOS 52 08/31/2011 1624   BILITOT 0.5 06/26/2017 0025   BILITOT 0.3 08/31/2011 1624   GFRNONAA >60 06/26/2017 0025   GFRNONAA >60 08/31/2011 1624   GFRAA >60 06/26/2017 0025   GFRAA >60 08/31/2011 1624       Component Value Date/Time   WBC 6.4 06/26/2017 0025   RBC 3.85 06/26/2017 0025   HGB 12.1 06/26/2017 0025   HGB 12.7 08/31/2011 1624   HCT 34.8 (L) 06/26/2017 0025   HCT 37.7 08/31/2011 1624   PLT 246 06/26/2017 0025   PLT 248 08/31/2011 1624   MCV 90.3 06/26/2017 0025   MCV 90 08/31/2011 1624   MCH 31.5 06/26/2017 0025   MCHC 34.9 06/26/2017 0025   RDW 11.9 06/26/2017 0025   RDW 12.6 08/31/2011 1624   LYMPHSABS 2.2 04/07/2017 1145   MONOABS 0.4 04/07/2017 1145   EOSABS 0.1 04/07/2017 1145   BASOSABS 0.0 04/07/2017 1145    No results found for: POCLITH, LITHIUM   No results found for: PHENYTOIN, PHENOBARB, VALPROATE, CBMZ   .res Assessment: Plan:   Pt seen for 25 minutes and time spent discussing treatment plan. She reports that benefits of Klonopin are currently outweighing side effects (fatigue). Discussed treatment options to include potential benefits, risks, and side effects of increasing Wellbutrin XL. Pt reports that she would prefer to continue Wellbutrin XL 150 mg po qd for depression at this time.  Discussed that atypical anti-psychotics can cause insulin resistance which can result in weight gain and difficulty losing weight. Discussed that some studies indicate that Metformin may be helpful with improving insulin resistance associated with atypical antipsychotics. Discussed potential benefits, risks, and side effects of Metformin and pt agrees to  trial of  Metformin for off-label indication for antipsychotic induced insulin resistance. Will start Metformin 500 mg daily with a meal for one week, then increase to 500 mg twice daily with meals.  Continue Olanzapine 10 mg po QHS for mood and anxiety.  Continue Clonidine 0.1 mg QHS and 1/2-1 tab po qd prn anxiety.  Continue Klonopin 0.5 mg po TID for anxiety. Continue Klonopin 1 mg po QHS for anxiety and insomnia.  Recommend continuing therapy with Stevphen MeuseHolly Ingram, [facility].  Pt to follow-up in 3 months or sooner if clinically indicated.  Patient advised to contact office with any questions, adverse effects, or acute worsening in signs and symptoms.    Tacey RuizLeah was seen today for follow-up.  Diagnoses and all orders for this visit:  Mild mood disorder (HCC) -     buPROPion (WELLBUTRIN XL) 150 MG 24 hr tablet; Take 1 tablet (150 mg total) by mouth daily.  Generalized anxiety disorder -     OLANZapine (ZYPREXA) 10 MG tablet; Take 1 tablet (10 mg total) by mouth at bedtime.  Insomnia, unspecified type -     OLANZapine (ZYPREXA) 10 MG tablet; Take 1 tablet (10 mg total) by mouth at bedtime.  Other orders -     metFORMIN (GLUCOPHAGE-XR) 500 MG 24 hr tablet; Take 1 tablet daily for one week, then increase to 1 tablet twice daily     Please see After Visit Summary for patient specific instructions.  Future Appointments  Date Time Provider Department Center  08/24/2021  2:00 PM Stevphen Meusengram, Holly, [facility] None  09/07/2021  2:00 PM Stevphen Meusengram, Holly, [facility] None  09/21/2021  2:00 PM Stevphen Meusengram, Holly, [facility] None  10/05/2021  1:00 PM [facility]phen [facility]usengram, Holly[facility]  10/19/2021  2:00 PM Stevphen Meusengram, Holly[facility] None  11/09/2021  2:00 PM Stevphen Meusengram, Holly, [facility] None  11/09/2021  3:30 PM Corie Chiquitoarter, Duval Macleod, PMHNP CP-CP None    No orders of the defined types were placed in this encounter.   -------------------------------

## 2021-08-24 ENCOUNTER — Ambulatory Visit: Payer: PRIVATE HEALTH INSURANCE | Admitting: Psychiatry

## 2021-08-25 ENCOUNTER — Telehealth: Payer: Self-pay | Admitting: Psychiatry

## 2021-08-25 NOTE — Telephone Encounter (Signed)
Please advise her to reduce Metformin to once daily with a meal if she was tolerating it before increasing to twice daily.  ?

## 2021-08-25 NOTE — Telephone Encounter (Signed)
Please review

## 2021-08-25 NOTE — Telephone Encounter (Signed)
Pt called and said that Shanda Bumps put her on metformin to help her not gain any more weight from being on the celbrexa. However, she has increased 2 x daily and ever since the increase she has headaches and is very sick on her stomach. She doesn't even want to eat and is nauseous. She doesn't want to gain any weight so if Shanda Bumps can switch her to something else that won't make her sick. Please call her at [phone] ? ?

## 2021-08-26 NOTE — Telephone Encounter (Signed)
Patient notified

## 2021-09-07 ENCOUNTER — Ambulatory Visit: Payer: PRIVATE HEALTH INSURANCE | Admitting: Psychiatry

## 2021-09-07 ENCOUNTER — Other Ambulatory Visit: Payer: Self-pay

## 2021-09-07 DIAGNOSIS — F4310 Post-traumatic stress disorder, unspecified: Secondary | ICD-10-CM

## 2021-09-07 NOTE — Progress Notes (Signed)
?      [facility] Counselor/Therapist Progress Note ? ?Patient ID: Peggy Ruiz, MRN: [id number],   ? ?Date: 09/07/2021 ? ?Time Spent: 55 minutes start time 2:05 PM end time 3:00 ? ?Treatment Type: Individual Therapy ? ?Reported Symptoms: sadness, anxiety, triggered responses, weight gain, health issues, melt downs, crying spells, panic ? ?Mental Status Exam: ? ?Appearance:   Well Groomed     ?Behavior:  Appropriate  ?Motor:  Normal  ?Speech/Language:   Normal Rate  ?Affect:  Appropriate tearful  ?Mood:  anxious and sad  ?Thought process:  circumstantial  ?Thought content:    WNL  ?Sensory/Perceptual disturbances:    WNL  ?Orientation:  oriented to person, place, time/date, and situation  ?Attention:  Good  ?Concentration:  Good  ?Memory:  WNL  ?Fund of knowledge:   Good  ?Insight:    Good  ?Judgment:   Good  ?Impulse Control:  Good  ? ?Risk Assessment: ?Danger to Self:  No ?Self-injurious Behavior: No ?Danger to Others: No ?Duty to Warn:no ?Physical Aggression / Violence:No  ?Access to Firearms a concern: No  ?Gang Involvement:No  ? ?Subjective: Patient reported that she was not doing well.  She explained that she is continuing to gain weight on the medication and she has gotten to a place of not being willing to gain any more weight.  She shared she looks in the mirror and she doesn't like what she sees but she is fearful to try another medication because she has such a hard time.  Patient did processing set on clothes not fitting right, suds level 10, negative cognition "I am fat" felt anxiety and shame in her chest and stomach.  Patient was able to reduce suds level to 5.  She was able to recognize that she has to communicate with her provider about the medication issues.  Patient is to work on self-care and diet over the next few weeks. ? ?Interventions: Cognitive Behavioral Therapy, Solution-Oriented/Positive Psychology, Insight-Oriented, and BS P ? ?Diagnosis: ?  ICD-10-CM   ?1. Posttraumatic stress disorder   F43.10   ?  ? ? ?Plan:  Patient is to use CBT and coping skills to decrease triggered responses.  Patient is to work on Psychologist, sport and exercise on her mirror to repeat regularly.  Patient is to take things in small steps so that she is able to accomplish tasks.  Patient is to take medication as directed.  Patient is to work on limit setting with her parents.  Patient is to work on focusing on the things that she can accomplish. ?Long-term goal: Interact normally with friends and family without irrational fears or intrusive thoughts that control behavior.  Display a full range of emotions without experiencing loss of control ?Short-term goal: Identify and replace negative self talk and catastrophic sizing that is associated with past trauma and current stimuli triggers for anxiety ?  ? ?Stevphen Meuse, [facility] ? ? ? ? ? ? ? ? ? ? ? ? ? ? ? ? ? ? ?

## 2021-09-15 ENCOUNTER — Ambulatory Visit: Payer: PRIVATE HEALTH INSURANCE | Admitting: Psychiatry

## 2021-09-15 ENCOUNTER — Other Ambulatory Visit: Payer: Self-pay

## 2021-09-15 ENCOUNTER — Encounter: Payer: Self-pay | Admitting: Psychiatry

## 2021-09-15 DIAGNOSIS — F39 Unspecified mood [affective] disorder: Secondary | ICD-10-CM

## 2021-09-15 DIAGNOSIS — F4310 Post-traumatic stress disorder, unspecified: Secondary | ICD-10-CM | POA: Diagnosis not present

## 2021-09-15 DIAGNOSIS — F411 Generalized anxiety disorder: Secondary | ICD-10-CM

## 2021-09-15 DIAGNOSIS — G4700 Insomnia, unspecified: Secondary | ICD-10-CM

## 2021-09-15 MED ORDER — BREXPIPRAZOLE 1 MG PO TABS
1.0000 mg | ORAL_TABLET | Freq: Every day | ORAL | 0 refills | Status: DC
Start: 2021-09-15 — End: 2021-10-19

## 2021-09-15 MED ORDER — OLANZAPINE 2.5 MG PO TABS: ORAL_TABLET | ORAL | 0 refills | Status: DC

## 2021-09-15 MED ORDER — REXULTI 0.5 MG PO TABS
0.5000 mg | ORAL_TABLET | Freq: Every day | ORAL | 0 refills | Status: DC
Start: 2021-09-15 — End: 2021-10-19

## 2021-09-15 MED ORDER — BREXPIPRAZOLE 1 MG PO TABS
1.0000 mg | ORAL_TABLET | Freq: Every day | ORAL | 1 refills | Status: DC
Start: 2021-09-15 — End: 2021-11-09

## 2021-09-15 MED ORDER — CLONAZEPAM 0.5 MG PO TABS: 0.5000 mg | ORAL_TABLET | Freq: Three times a day (TID) | ORAL | 2 refills | Status: DC

## 2021-09-15 MED ORDER — BUPROPION HCL ER (XL) 150 MG PO TB24: 150.0000 mg | ORAL_TABLET | Freq: Every day | ORAL | 0 refills | Status: DC

## 2021-09-15 NOTE — Progress Notes (Signed)
Peggy HirschfeldLeah C Ruiz ?[id number] ?10/04/1985 ?36 y.o. ? ?Subjective:  ? ?Patient ID:  Peggy ShadowLeah C Ruiz is a 36 y.o. (DOB 12/28/1985) female. ? ?Chief Complaint:  ?Chief Complaint  ?Patient presents with  ? Anxiety  ? Depression  ? ? ?HPI ?Peggy Ruiz presents to the office today for follow-up of depression, anxiety, and insomnia. She reports concern about weight gain with Olanzapine- "I feel fat, I feel disgusting." She reports that she had nausea with increasing Metformin and has continued to have diarrhea multiple times daily on lower dose of Metformin. No change in appetite. Reports eating less and has early satiety.  ? ?Describes "good days and bad days" in terms of mood. She reports that her mood will be ok for about a week and then other times has more anxiety. She reports that she became "very upset" last night and banged her fist on wooden table. She reports severe fatigue at times. She reports periods of feeling more down. Has periods of feeling frustrated. Sleeping well. Motivation has been ok.  ? ?She describes frequent anxiety and will often have to be conscious of controlling anxiety. She reports feeling "isolated, alone." She reports that it has been difficult making friends and connections in new location.  ? ?She reports that she will periodically say, "I don't want to do this anymore" and denies SI and reports "I just want it to stop" in terms of pain and anxiety.  ? ?Husband will be working every Sunday through the end of June, so they are not able to go to church together. They now have only one day together and he often works late.  ? ?Past Psychiatric Medication Trials: ?Cymbalta- Effective for a period of time and then no longer as effective.  ?Prozac ?Lexapro ?Paxil- Initially helped and then did not seem to be as effective when used without Zyprexa ?Effexor ?Nortriptyline-prescribed by GI for gastroparesis ?Remeron-caused insomnia ?Buspar- Adverse reaction ?Abilify-involuntary  movements ?Zyprexa-effective and took for about 14 years.  Caused excessive weight gain at times. ?Seroquel-Ineffective. Felt like a "zombie."  ?Latuda-effective. Sleeping "harder" on 80 mg. ?Lamictal ?Gabapentin- Cannot tolerate doses higher than 800 mg BID ?Trileptal- Reports that she was unable to tolerate 900 mg QHS ?Belsomra-helpful with sleep quality but not sleep quantity. ?Klonopin ?Ativan ?Librium ?Clonidine- Effective ?Propranolol ?Trazodone ?Ambien ?Nuvigil ?Adderall ?Deplin ?  ? ?AIMS   ? ?Flowsheet Row Office Visit from 02/26/2021 in [facility] Office Visit from 12/22/2020 in [facility] Office Visit from 09/02/2020 in [facility] Office Visit from 02/21/2020 in [facility] Office Visit from 02/25/2019 in [facility]  ?AIMS Total Score 0 0 0 0 0  ? ?  ?  ? ?Review of Systems:  ?Review of Systems  ?Constitutional:   ?     Night sweats, hot flashes  ?Gastrointestinal:  Positive for diarrhea.  ?Genitourinary:  Positive for menstrual problem and pelvic pain.  ?     Recent 10 day period of menstrual bleeding and severe cramps. Reports that periods are worsening.   ?Musculoskeletal:  Negative for gait problem.  ?Neurological:  Positive for headaches.  ?Psychiatric/Behavioral:    ?     Please refer to HPI  ? ?Medications: I have reviewed the patient's current medications. ? ?Current Outpatient Medications  ?Medication Sig Dispense Refill  ? Brexpiprazole (REXULTI) 0.5 MG TABS Take 1 tablet (0.5 mg total) by mouth daily. 7 tablet 0  ? brexpiprazole (REXULTI) 1 MG TABS tablet Take 1 tablet (1 mg  total) by mouth daily. 30 tablet 1  ? brexpiprazole (REXULTI) 1 MG TABS tablet Take 1 tablet (1 mg total) by mouth daily. 14 tablet 0  ? buPROPion (WELLBUTRIN XL) 150 MG 24 hr tablet Take 1 tablet (150 mg total) by mouth daily. 90 tablet 0  ? [START ON 10/13/2021] clonazePAM (KLONOPIN) 0.5 MG tablet Take 1 tablet (0.5 mg total) by mouth 3  (three) times daily. Take 1 mg tab at bedtime 90 tablet 2  ? clonazePAM (KLONOPIN) 1 MG tablet Take 1 tablet (1 mg total) by mouth at bedtime. 30 tablet 5  ? cloNIDine (CATAPRES) 0.1 MG tablet Take 1 tab po QHS and 1 qd prn 180 tablet 0  ? dexlansoprazole (DEXILANT) 60 MG capsule Take 60 mg by mouth every morning.    ? famotidine (PEPCID) 20 MG tablet Take 20 mg by mouth 2 (two) times daily.     ? gabapentin (NEURONTIN) 800 MG tablet Take 800 mg by mouth 2 (two) times daily.    ? levonorgestrel-ethinyl estradiol (SEASONALE,INTROVALE,JOLESSA) 0.15-0.03 MG tablet Take 1 tablet by mouth daily.    ? levothyroxine (SYNTHROID, LEVOTHROID) 50 MCG tablet Take 50 mcg by mouth daily.    ? Lifitegrast (XIIDRA) 5 % SOLN Apply to eye.    ? magnesium 30 MG tablet Take 30 mg by mouth 2 (two) times daily.    ? morphine (MSIR) 15 MG tablet morphine 15 mg immediate release tablet ? Take 1 tablet 3 times a day by oral route as needed.    ? Multiple Vitamin (MULTIVITAMIN) tablet Take 1 tablet by mouth daily.    ? nortriptyline (PAMELOR) 10 MG capsule Take 1 capsule (10 mg total) by mouth at bedtime. 90 capsule 0  ? OLANZapine (ZYPREXA) 2.5 MG tablet Take 3 tabs daily for one week, then 2 tabs daily for one week, then 1 tab daily for one week 45 tablet 0  ? ondansetron (ZOFRAN-ODT) 8 MG disintegrating tablet Take 8 mg by mouth every 8 (eight) hours as needed for nausea.    ? ?No current facility-administered medications for this visit.  ? ? ?Medication Side Effects: Other: Diarrhea ? ?Allergies:  ?Allergies  ?Allergen Reactions  ? Promethazine   ?  Other reaction(s): Agitation  ? Ciprofloxacin Other (See Comments)  ?  c-diff  ? Dilaudid [Hydromorphone]   ?  sob  ? Epinephrine   ? Penicillins Hives  ?  Has patient had a PCN reaction causing immediate rash, facial/tongue/throat swelling, SOB or lightheadedness with hypotension: Yes ?Has patient had a PCN reaction causing severe rash involving mucus membranes or skin necrosis: No ?Has  patient had a PCN reaction that required hospitalization: No ?Has patient had a PCN reaction occurring within the last 10 years: Yes ?If all of the above answers are "NO", then may proceed with Cephalosporin use. ?  ? Toradol [Ketorolac Tromethamine] Nausea And Vomiting  ? ? ?Past Medical History:  ?Diagnosis Date  ? Anorexia   ? at 15  ? Anxiety   ? Attention deficit disorder (ADD)   ? Bone spur   ? Depression   ? Eczema   ? HPV (human papilloma virus) infection   ? ? ?Past Medical History, Surgical history, Social history, and Family history were reviewed and updated as appropriate.  ? ?Please see review of systems for further details on the patient's review from today.  ? ?Objective:  ? ?Physical Exam:  ?BP 106/68   Pulse 80   Wt 163 lb (73.9 kg)  BMI 24.07 kg/m?  ? ?Physical Exam ?Constitutional:   ?   General: She is not in acute distress. ?Musculoskeletal:     ?   General: No deformity.  ?Neurological:  ?   Mental Status: She is alert and oriented to person, place, and time.  ?   Coordination: Coordination normal.  ?Psychiatric:     ?   Attention and Perception: Attention and perception normal. She does not perceive auditory or visual hallucinations.     ?   Mood and Affect: Mood is anxious. Mood is not depressed. Affect is not labile, blunt, angry or inappropriate.     ?   Speech: Speech normal.     ?   Behavior: Behavior normal.     ?   Thought Content: Thought content normal. Thought content is not paranoid or delusional. Thought content does not include homicidal or suicidal ideation. Thought content does not include homicidal or suicidal plan.     ?   Cognition and Memory: Cognition and memory normal.     ?   Judgment: Judgment normal.  ?   Comments: Insight intact  ? ? ?Lab Review:  ?   ?Component Value Date/Time  ? NA 139 06/26/2017 0025  ? NA 138 08/31/2011 1624  ? K 3.7 06/26/2017 0025  ? K 4.1 08/31/2011 1624  ? CL 105 06/26/2017 0025  ? CL 102 08/31/2011 1624  ? CO2 25 06/26/2017 0025  ? CO2 27  08/31/2011 1624  ? GLUCOSE 110 (H) 06/26/2017 0025  ? GLUCOSE 81 08/31/2011 1624  ? BUN 9 06/26/2017 0025  ? BUN 10 08/31/2011 1624  ? CREATININE 0.88 06/26/2017 0025  ? CREATININE 0.88 08/31/2011 1624  ? CALCIUM 9.1 06/26/2017 0025  ? C

## 2021-09-20 ENCOUNTER — Telehealth: Payer: Self-pay | Admitting: Psychiatry

## 2021-09-20 NOTE — Telephone Encounter (Signed)
Pt called at 2:24 pm and was checking on the status of the PA on her rexulti. She only has two weeks of samples. Please call her at [phone] and let her know the status ?

## 2021-09-21 ENCOUNTER — Other Ambulatory Visit: Payer: Self-pay

## 2021-09-21 ENCOUNTER — Ambulatory Visit: Payer: PRIVATE HEALTH INSURANCE | Admitting: Psychiatry

## 2021-09-21 DIAGNOSIS — F4310 Post-traumatic stress disorder, unspecified: Secondary | ICD-10-CM | POA: Diagnosis not present

## 2021-09-21 NOTE — Progress Notes (Signed)
?    [facility] Counselor/Therapist Progress Note ? ?Patient ID: Peggy Ruiz, MRN: [id number],   ? ?Date: 09/21/2021 ? ?Time Spent: 50 minutes start time 2:09 PM end time 2:59 PM ? ?Treatment Type: Individual Therapy ? ?Reported Symptoms: anxiety, overwhelmed, triggered responses, sadness, fatigued, chronic pain issues, migraines, crying spells ? ?Mental Status Exam: ? ?Appearance:   Well Groomed     ?Behavior:  Appropriate  ?Motor:  Normal  ?Speech/Language:   Normal Rate  ?Affect:  Appropriate  ?Mood:  anxious  ?Thought process:  circumstantial  ?Thought content:    WNL  ?Sensory/Perceptual disturbances:    Headache   ?Orientation:  oriented to person, place, time/date, and situation  ?Attention:  Good  ?Concentration:  Good  ?Memory:  WNL  ?Fund of knowledge:   Good  ?Insight:    Good  ?Judgment:   Good  ?Impulse Control:  Good  ? ?Risk Assessment: ?Danger to Self:  No ?Self-injurious Behavior: No ?Danger to Others: No ?Duty to Warn:no ?Physical Aggression / Violence:No  ?Access to Firearms a concern: No  ?Gang Involvement:No  ? ?Subjective: Patient was present for session. She shared she is still trying to figure out her medication issues.  She went on to share that she is realizing her depression and mood issues are creating issues for her husband and that is very upsetting to her.  She shared that he told her that she gets upset over the things that she says to him during the day when she starts having the intrusive thoughts and gets triggered.  Patient shared she is going to work on reassuring him that she will still be here regardless of how she feels.  Encouraged patient to focus more on what she can do than what she cannot do.  Discussed her PTSD and medical issues and how even with those she is still capable of making a difference in other's lives that she wants to.  Discussed different things that she can start looking into as possible options.  Also talked about CBT skills to help talk herself  through having appropriate goals and expectations of herself.  Discussed the importance of recognizing her limitations and setting up realistic goals for each day so she still feels like she is able to achieve things.  Patient was encouraged to try to accomplish something small then take a break and then go to the next thing.  The importance of affirming herself during the process was discussed with patient.  Patient was also given handouts by Dr. Estevan Oaks on how to work on changing her neuro cycle pattern so that it is more positive.  Patient agreed to work on plans from session. ? ?Interventions: Cognitive Behavioral Therapy and Solution-Oriented/Positive Psychology ? ?Diagnosis: ?  ICD-10-CM   ?1. PTSD (post-traumatic stress disorder)  F43.10   ?  ? ? ?Plan:  Patient is to use CBT and coping skills to decrease triggered responses.  Patient is to work on Psychologist, sport and exercise on her mirror to repeat regularly.  Patient is to take things in small steps so that she is able to accomplish tasks.  Patient is to take medication as directed.  Patient is to work on limit setting with her parents.  Patient is to work on focusing on the things that she can accomplish.  Patient is to work on handouts given to her by Dr. De Burrs on the neuro cycle ?Long-term goal: Interact normally with friends and family without irrational fears or intrusive thoughts that control  behavior.  Display a full range of emotions without experiencing loss of control ?Short-term goal: Identify and replace negative self talk and catastrophic sizing that is associated with past trauma and current stimuli triggers for anxiety ? ?Stevphen Meuse, [facility] ? ? ? ? ? ? ? ? ? ? ? ? ? ? ? ? ? ? ?

## 2021-09-21 NOTE — Telephone Encounter (Signed)
Prior Approval received for REXULTI 1 MG effective 09/21/2021-09/21/2022 with [place] [facility] [place], [place] [phone] ?

## 2021-10-05 ENCOUNTER — Ambulatory Visit: Payer: PRIVATE HEALTH INSURANCE | Admitting: Psychiatry

## 2021-10-05 DIAGNOSIS — F4310 Post-traumatic stress disorder, unspecified: Secondary | ICD-10-CM | POA: Diagnosis not present
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
## 2021-10-11 ENCOUNTER — Telehealth: Payer: Self-pay | Admitting: Psychiatry

## 2021-10-11 NOTE — Telephone Encounter (Signed)
Patient said since she was started on Rexulti she was to wean off the Zyprexa. She doesn't want to try bumping up the Zyprexa and she doesn't want to add another medication at this point (something to help her sleep). She said she would try to tough it out until her appt next week.  ?

## 2021-10-11 NOTE — Telephone Encounter (Signed)
Ok. Recommend continuing Olanzapine 2.5 mg until apt next week and to call if sleep worsens.  ?

## 2021-10-11 NOTE — Telephone Encounter (Signed)
Patient called in regarding her Zyprexa. States that she is weaning off it and now is down to 2.5mg . Every since she has been on this low dosage she has not been sleeping. Says that she gets about an hour an than she is up. She was told to call if she had any side effects and she hasn't been sleeping for the past four days. Pls rtc to discuss [phone]  ?

## 2021-10-12 NOTE — Telephone Encounter (Signed)
Patient notified of recommendations. 

## 2021-10-19 ENCOUNTER — Ambulatory Visit: Payer: PRIVATE HEALTH INSURANCE | Admitting: Psychiatry

## 2021-10-19 ENCOUNTER — Encounter: Payer: Self-pay | Admitting: Psychiatry

## 2021-10-19 VITALS — Wt 163.0 lb

## 2021-10-19 DIAGNOSIS — G4700 Insomnia, unspecified: Secondary | ICD-10-CM | POA: Diagnosis not present

## 2021-10-19 DIAGNOSIS — F39 Unspecified mood [affective] disorder: Secondary | ICD-10-CM

## 2021-10-19 DIAGNOSIS — F4310 Post-traumatic stress disorder, unspecified: Secondary | ICD-10-CM

## 2021-10-19 DIAGNOSIS — F411 Generalized anxiety disorder: Secondary | ICD-10-CM | POA: Diagnosis not present

## 2021-10-19 MED ORDER — OLANZAPINE 2.5 MG PO TABS: 2.5000 mg | ORAL_TABLET | Freq: Every day | ORAL | 0 refills | Status: DC
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
## 2021-11-09 ENCOUNTER — Encounter: Payer: Self-pay | Admitting: Psychiatry

## 2021-11-09 ENCOUNTER — Telehealth: Payer: PRIVATE HEALTH INSURANCE | Admitting: Psychiatry

## 2021-11-09 ENCOUNTER — Ambulatory Visit: Payer: PRIVATE HEALTH INSURANCE | Admitting: Psychiatry

## 2021-11-09 DIAGNOSIS — F4310 Post-traumatic stress disorder, unspecified: Secondary | ICD-10-CM

## 2021-11-09 DIAGNOSIS — G4700 Insomnia, unspecified: Secondary | ICD-10-CM | POA: Diagnosis not present

## 2021-11-09 DIAGNOSIS — F39 Unspecified mood [affective] disorder: Secondary | ICD-10-CM

## 2021-11-09 DIAGNOSIS — F411 Generalized anxiety disorder: Secondary | ICD-10-CM

## 2021-11-09 MED ORDER — OLANZAPINE 2.5 MG PO TABS: ORAL_TABLET | ORAL | 0 refills | Status: DC

## 2021-11-09 MED ORDER — CLONAZEPAM 1 MG PO TABS: 1.0000 mg | ORAL_TABLET | Freq: Every day | ORAL | 5 refills | Status: DC

## 2021-11-09 MED ORDER — BUPROPION HCL ER (XL) 150 MG PO TB24: 150.0000 mg | ORAL_TABLET | Freq: Every day | ORAL | 0 refills | Status: DC

## 2021-11-09 MED ORDER — BREXPIPRAZOLE 2 MG PO TABS: 2.0000 mg | ORAL_TABLET | Freq: Every day | ORAL | 1 refills | Status: DC

## 2021-11-09 NOTE — Progress Notes (Signed)
Peggy Ruiz ?[id number] ?[birth year] ?36 y.o. ? ?Virtual Visit via Video Note ? ?I connected with pt @ on 11/09/21 at  3:30 PM EDT by a video enabled telemedicine application and verified that I am speaking with the correct person using two identifiers. ?  ?I discussed the limitations of evaluation and management by telemedicine and the availability of in person appointments. The patient expressed understanding and agreed to proceed. ? ?I discussed the assessment and treatment plan with the patient. The patient was provided an opportunity to ask questions and all were answered. The patient agreed with the plan and demonstrated an understanding of the instructions. ?  ?The patient was advised to call back or seek an in-person evaluation if the symptoms worsen or if the condition fails to improve as anticipated. ? ?I provided 25 minutes of non-face-to-face time during this encounter.  The patient was located at home.  The provider was located at home. ? ? ?Corie Chiquito, PMHNP  ? ?Subjective:  ? ?Patient ID:  Peggy Ruiz is a 36 y.o. (DOB Nov 13, 1985) female. ? ?Chief Complaint:  ?Chief Complaint  ?Patient presents with  ? Anxiety  ? Follow-up  ?  Depression, insomnia  ? ? ?HPI ?Peggy Ruiz presents for follow-up of anxiety, depression, and insomnia. Saw spine specialist and an MRI was ordered. She reports that specialist is concerned about possible ruptured disc. She reports that this has caused some anxiety and she is trying not to have catastrophic worry. She reports that she has had chronic pain since last visit and pain has limited her activity. She reports "anxiety-wise, I think I am pretty good for what I am going through." She reports some irritability in response to pain. She reports some sadness in response to pain and limited activity "but I don't think I'm depressed." Denies persistent depression. She reports that she thinks that Rexulti has been effective. She reports that she continues to have panic  attacks and that these can come on suddenly. Panic attacks occur every 1-2 times a week in response to an identifiable trigger. Energy and motivation have been good.  ? ?She is sleeping well and her stomach has been feeling "ok." She reports that her GI s/s have returned to normal. Appetite has been the same. No change in early satiety. She reports that her weight was down a couple of pounds. Sleeping about 7 hours without interruption and then goes back to bed for an additional 1.5 hours. Concentration has been ok. Denies SI.  ? ?She has been off OCP for 6 days and has taken OCP for years.  ? ?Dizziness resolved when she stopped taking 1/2 tab of Clonidine in the morning.  ? ?Past Psychiatric Medication Trials: ?Cymbalta- Effective for a period of time and then no longer as effective.  ?Prozac ?Lexapro ?Paxil- Initially helped and then did not seem to be as effective when used without Zyprexa ?Effexor ?Nortriptyline-prescribed by GI for gastroparesis ?Remeron-caused insomnia ?Buspar- Adverse reaction ?Abilify-involuntary movements ?Zyprexa-effective and took for about 14 years.  Caused excessive weight gain at times. ?Seroquel-Ineffective. Felt like a "zombie."  ?Latuda-effective. Sleeping "harder" on 80 mg. ?Lamictal ?Gabapentin- Cannot tolerate doses higher than 800 mg BID ?Trileptal- Reports that she was unable to tolerate 900 mg QHS ?Belsomra-helpful with sleep quality but not sleep quantity. ?Klonopin ?Ativan ?Librium ?Clonidine- Effective ?Propranolol ?Trazodone ?Ambien ?Nuvigil ?Adderall ?Deplin ?  ? ?Review of Systems:  ?Review of Systems  ?Genitourinary:   ?     Has transvaginal ultrasound tomorrow.  She will have a colposcopy. Obgyn advised her to stop OCP for a month before drawing labs. Now having some hot flashes.  ?Musculoskeletal:  Positive for back pain. Negative for gait problem.  ?Psychiatric/Behavioral:    ?     Please refer to HPI  ? ?Medications: I have reviewed the patient's current  medications. ? ?Current Outpatient Medications  ?Medication Sig Dispense Refill  ? brexpiprazole (REXULTI) 2 MG TABS tablet Take 1 tablet (2 mg total) by mouth daily. 30 tablet 1  ? buPROPion (WELLBUTRIN XL) 150 MG 24 hr tablet Take 1 tablet (150 mg total) by mouth daily. 90 tablet 0  ? clonazePAM (KLONOPIN) 0.5 MG tablet Take 1 tablet (0.5 mg total) by mouth 3 (three) times daily. Take 1 mg tab at bedtime 90 tablet 2  ? [START ON 11/17/2021] clonazePAM (KLONOPIN) 1 MG tablet Take 1 tablet (1 mg total) by mouth at bedtime. 30 tablet 5  ? cloNIDine (CATAPRES) 0.1 MG tablet Take 1 tab po QHS and 1 qd prn 180 tablet 0  ? dexlansoprazole (DEXILANT) 60 MG capsule Take 60 mg by mouth every morning.    ? famotidine (PEPCID) 20 MG tablet Take 20 mg by mouth 2 (two) times daily.     ? gabapentin (NEURONTIN) 800 MG tablet Take 800 mg by mouth 2 (two) times daily.    ? levonorgestrel-ethinyl estradiol (SEASONALE,INTROVALE,JOLESSA) 0.15-0.03 MG tablet Take 1 tablet by mouth daily. (Patient not taking: Reported on 11/09/2021)    ? levothyroxine (SYNTHROID, LEVOTHROID) 50 MCG tablet Take 50 mcg by mouth daily.    ? Lifitegrast (XIIDRA) 5 % SOLN Apply to eye.    ? magnesium 30 MG tablet Take 30 mg by mouth 2 (two) times daily.    ? morphine (MSIR) 15 MG tablet morphine 15 mg immediate release tablet ? Take 1 tablet 3 times a day by oral route as needed.    ? Multiple Vitamin (MULTIVITAMIN) tablet Take 1 tablet by mouth daily.    ? nortriptyline (PAMELOR) 10 MG capsule Take 1 capsule (10 mg total) by mouth at bedtime. 90 capsule 0  ? OLANZapine (ZYPREXA) 2.5 MG tablet Take 1/2 tablet for 1-2 weeks, then stop 30 tablet 0  ? ondansetron (ZOFRAN-ODT) 8 MG disintegrating tablet Take 8 mg by mouth every 8 (eight) hours as needed for nausea.    ? predniSONE (DELTASONE) 10 MG tablet prednisone 10 mg tablet ? Take 6 tablets po x1 day, 5 tablets po x 1 day, 4 tablets po x 1 day, 3 tablets po x 1 day, 2 tablets po x 1 day, 1 tablet po x 1 day     ? ?No current facility-administered medications for this visit.  ? ? ?Medication Side Effects: None ? ?Allergies:  ?Allergies  ?Allergen Reactions  ? Promethazine   ?  Other reaction(s): Agitation  ? Ciprofloxacin Other (See Comments)  ?  c-diff  ? Dilaudid [Hydromorphone]   ?  sob  ? Epinephrine   ? Penicillins Hives  ?  Has patient had a PCN reaction causing immediate rash, facial/tongue/throat swelling, SOB or lightheadedness with hypotension: Yes ?Has patient had a PCN reaction causing severe rash involving mucus membranes or skin necrosis: No ?Has patient had a PCN reaction that required hospitalization: No ?Has patient had a PCN reaction occurring within the last 10 years: Yes ?If all of the above answers are "NO", then may proceed with Cephalosporin use. ?  ? Toradol [Ketorolac Tromethamine] Nausea And Vomiting  ? ? ?Past Medical  History:  ?Diagnosis Date  ? Anorexia   ? at 34  ? Anxiety   ? Attention deficit disorder (ADD)   ? Bone spur   ? Depression   ? Eczema   ? HPV (human papilloma virus) infection   ? ? ?Family History  ?Problem Relation Age of Onset  ? Fibromyalgia Mother   ? Anxiety disorder Mother   ? Depression Mother   ? Depression Father   ? Anxiety disorder Father   ? ? ?Social History  ? ?Socioeconomic History  ? Marital status: Married  ?  Spouse name: Not on file  ? Number of children: Not on file  ? Years of education: Not on file  ? Highest education level: Not on file  ?Occupational History  ? Not on file  ?Tobacco Use  ? Smoking status: Never  ? Smokeless tobacco: Never  ?Vaping Use  ? Vaping Use: Never used  ?Substance and Sexual Activity  ? Alcohol use: Yes  ?  Comment: occasion  ? Drug use: No  ? Sexual activity: Never  ?Other Topics Concern  ? Not on file  ?Social History Narrative  ? Regular exercise: none  ? Caffeine use: daily  ? ?Social Determinants of Health  ? ?Financial Resource Strain: Not on file  ?Food Insecurity: Not on file  ?Transportation Needs: Not on file  ?Physical  Activity: Not on file  ?Stress: Not on file  ?Social Connections: Not on file  ?Intimate Partner Violence: Not on file  ? ? ?Past Medical History, Surgical history, Social history, and Family history were review

## 2021-11-09 NOTE — Progress Notes (Signed)
?    [facility] Counselor[facility]rapist Progress Note ? ?Patient ID: Peggy Ruiz, MRN: [id number],   ? ?Date: 11/09/2021 ? ?Time Spent: 50 minutes start time 2:08 PM end time 2:58 PM ? ?Treatment Type: Individual Therapy ? ?Reported Symptoms: chronic pain, irritability, triggered responses, anxiety, panic, sadness ? ?Mental Status Exam: ? ?Appearance:   Well Groomed     ?Behavior:  Appropriate  ?Motor:  Normal  ?Speech/Language:   Normal Rate  ?Affect:  Appropriate  ?Mood:  anxious  ?Thought process:  normal  ?Thought content:    WNL  ?Sensory/Perceptual disturbances:    Pain  ?Orientation:  oriented to person, place, time/date, and situation  ?Attention:  Good  ?Concentration:  Good  ?Memory:  WNL  ?Fund of knowledge:   Good  ?Insight:    Good  ?Judgment:   Good  ?Impulse Control:  Good  ? ?Risk Assessment: ?Danger to Self:  No ?Self-injurious Behavior: No ?Danger to Others: No ?Duty to Warn:no ?Physical Aggression / Violence:No  ?Access to Firearms a concern: No  ?Gang Involvement:No  ? ?Subjective: Patient was present for session.  She shared she is in extreme back pain and is working with her back Careers adviser on the issue. She shared she will be getting an MRI on her back.  She is also having OBGYN for issues she is having.  She shared she was bleeding for 4 weeks.  Patient went on to share she is very overwhelmed with all the pain issues she is having and it is preventing her from being able to do more of the things that she used to do which is even harder.  Patient was encouraged to think back to what things she can do.  She was reminded to focus on what she can control fix and change and keep her brain engaged in positive activity wrote out different exercises that she can do to try and help her feel more of a purpose.  Patient was reminded of the 21 detox of her nurse cycle by Dr. De Burrs that she needs to do daily to try and help change her thoughts in a more positive direction.  Patient was also  encouraged to have regular contact with people that she can communicate with on the phone so that she can help have perspective outside of herself.  Patient agreed to work on strategies discussed in session. ? ?Interventions: CBT, solution focused, insight oriented ? ?Diagnosis: ?  ICD-10-CM   ?1. Posttraumatic stress disorder  F43.10   ?  ? ? ?Plan: Patient is to use CBT and coping skills to decrease triggered responses.  Patient is to work on Psychologist, sport and exercise on her mirror to repeat regularly.  Patient is to take things in small steps so that she is able to accomplish tasks.  Patient is to take medication as directed.   Patient is to work on focusing on the things that she can accomplish.  Patient is to work on Dr. Rayfield Citizen leaf on the neuro cycle detox.  Patient is to continue working with providers on medical issues. ?Long-term goal: Interact normally with friends and family without irrational fears or intrusive thoughts that control behavior.  Display a full range of emotions without experiencing loss of control ?Short-term goal: Identify and replace negative self talk and catastrophic sizing that is associated with past trauma and current stimuli triggers for anxiety ? ?Stevphen Meuse, [facility] ? ? ? ? ? ? ? ? ? ? ? ? ? ? ? ? ? ? ?

## 2021-11-23 ENCOUNTER — Ambulatory Visit: Payer: PRIVATE HEALTH INSURANCE | Admitting: Psychiatry

## 2021-11-23 DIAGNOSIS — F4310 Post-traumatic stress disorder, unspecified: Secondary | ICD-10-CM
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
## 2021-12-07 ENCOUNTER — Ambulatory Visit: Payer: PRIVATE HEALTH INSURANCE | Admitting: Psychiatry

## 2021-12-07 DIAGNOSIS — F4310 Post-traumatic stress disorder, unspecified: Secondary | ICD-10-CM | POA: Diagnosis not present
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
## 2021-12-08 DIAGNOSIS — R232 Flushing: Secondary | ICD-10-CM | POA: Diagnosis not present

## 2021-12-14 DIAGNOSIS — M5416 Radiculopathy, lumbar region: Secondary | ICD-10-CM | POA: Diagnosis not present

## 2021-12-17 DIAGNOSIS — Z712 Person consulting for explanation of examination or test findings: Secondary | ICD-10-CM | POA: Diagnosis not present

## 2021-12-17 DIAGNOSIS — R102 Pelvic and perineal pain: Secondary | ICD-10-CM | POA: Diagnosis not present

## 2021-12-17 DIAGNOSIS — Z9189 Other specified personal risk factors, not elsewhere classified: Secondary | ICD-10-CM | POA: Diagnosis not present

## 2021-12-17 DIAGNOSIS — Z124 Encounter for screening for malignant neoplasm of cervix: Secondary | ICD-10-CM | POA: Diagnosis not present

## 2021-12-21 ENCOUNTER — Encounter: Payer: Self-pay | Admitting: Psychiatry

## 2021-12-21 ENCOUNTER — Ambulatory Visit: Payer: PRIVATE HEALTH INSURANCE | Admitting: Psychiatry

## 2021-12-21 DIAGNOSIS — F39 Unspecified mood [affective] disorder: Secondary | ICD-10-CM

## 2021-12-21 DIAGNOSIS — F4310 Post-traumatic stress disorder, unspecified: Secondary | ICD-10-CM

## 2021-12-21 DIAGNOSIS — F411 Generalized anxiety disorder: Secondary | ICD-10-CM | POA: Diagnosis not present

## 2021-12-21 MED ORDER — CLONIDINE HCL 0.1 MG PO TABS: 0.1000 mg | ORAL_TABLET | Freq: Every day | ORAL | 1 refills | Status: DC

## 2021-12-21 MED ORDER — BREXPIPRAZOLE 2 MG PO TABS: 2.0000 mg | ORAL_TABLET | Freq: Every day | ORAL | 0 refills | Status: DC

## 2021-12-21 MED ORDER — BUPROPION HCL ER (XL) 150 MG PO TB24: 150.0000 mg | ORAL_TABLET | Freq: Every day | ORAL | 0 refills | Status: DC

## 2021-12-21 MED ORDER — CLONAZEPAM 0.5 MG PO TABS: 0.5000 mg | ORAL_TABLET | Freq: Three times a day (TID) | ORAL | 4 refills | Status: DC

## 2021-12-21 NOTE — Progress Notes (Signed)
[name] VO:[id number] 1986-04-28 36 y.o.  Subjective:   Patient ID:  Peggy Ruiz is a 36 y.o. (DOB 1985-07-21) female.  Chief Complaint:  Chief Complaint  Patient presents with   Follow-up    Anxiety, depression, insomnia    HPI [name] presents to the office today for follow-up of anxiety, depression, and insomnia. She reports that she had some restlessness and shakiness the first 1-2 weeks of Rexulti 2 mg and then this resolved. She reports that Rexulti 2 mg has been more effective for her mood and anxiety than the 1 mg dose.   She reports that there have been stressors related to husband's work. Husband has been working long hours. She reports that she has anxiety and panic when husband has stress at work or is having difficulties. She reports "generally... I am less overall anxious." She reports that her mood has been more stable.   She reports that she has been waking up at night due to feeling hot/sweating and was told she may be perimenopausal. She moved third dose of Klonopin 0.5 mg to bedtime with 1 mg tab and sleep improved significantly. She reports that she has lost about 10 lbs in the last 6 weeks since stopping Olanzapine. She reports that her appetite has been decreased. Energy and motivation have been good. Concentration has been adequate. Denies SI.   Learned she does not need surgery and that she likely sprained muscles when she fell. She will have a steroid/epidural injection this week.     Past Psychiatric Medication Trials: Cymbalta- Effective for a period of time and then no longer as effective.  Prozac Lexapro Paxil- Initially helped and then did not seem to be as effective when used without Zyprexa Effexor Nortriptyline-prescribed by GI for gastroparesis Remeron-caused insomnia Buspar- Adverse reaction Abilify-involuntary movements Zyprexa-effective and took for about 14 years.  Caused excessive weight gain at times. Seroquel-Ineffective. Felt  like a "zombie."  Latuda-effective. Sleeping "harder" on 80 mg. Lamictal Gabapentin- Cannot tolerate doses higher than 800 mg BID Trileptal- Reports that she was unable to tolerate 900 mg QHS Belsomra-helpful with sleep quality but not sleep quantity. Klonopin Ativan Librium Clonidine- Effective Propranolol Trazodone Ambien Nuvigil Adderall Deplin  AIMS    Flowsheet Row Office Visit from 12/21/2021 in [place] Visit from 02/26/2021 in [place] Visit from 12/22/2020 in [place] Visit from 09/02/2020 in [place] Visit from 02/21/2020 in [place] Total Score 0 0 0 0 0        Review of Systems:  Review of Systems  Constitutional:        Hot flashes, night sweats  Endocrine:       She reports that her estrogen levels were low and was told she may be perimenopausal.   Genitourinary:        She will have a copper IUD placed July 3  Musculoskeletal:  Positive for back pain. Negative for gait problem.  Neurological:  Negative for tremors.       Nerve pain  Psychiatric/Behavioral:         Please refer to HPI    Medications: I have reviewed the patient's current medications.  Current Outpatient Medications  Medication Sig Dispense Refill   dexlansoprazole (DEXILANT) 60 MG capsule Take 60 mg by mouth every morning.     famotidine (PEPCID) 20 MG tablet Take 20 mg by mouth 2 (two) times daily.  gabapentin (NEURONTIN) 800 MG tablet Take 800 mg by mouth 2 (two) times daily.     levothyroxine (SYNTHROID, LEVOTHROID) 50 MCG tablet Take 50 mcg by mouth daily.     Lifitegrast (XIIDRA) 5 % SOLN Apply to eye.     magnesium 30 MG tablet Take 30 mg by mouth 2 (two) times daily.     morphine (MSIR) 15 MG tablet morphine 15 mg immediate release tablet  Take 1 tablet 3 times a day by oral route as needed.     Multiple Vitamin (MULTIVITAMIN) tablet Take 1 tablet  by mouth daily.     nortriptyline (PAMELOR) 10 MG capsule Take 1 capsule (10 mg total) by mouth at bedtime. 90 capsule 0   ondansetron (ZOFRAN-ODT) 8 MG disintegrating tablet Take 8 mg by mouth every 8 (eight) hours as needed for nausea.     brexpiprazole (REXULTI) 2 MG TABS tablet Take 1 tablet (2 mg total) by mouth daily. 90 tablet 0   buPROPion (WELLBUTRIN XL) 150 MG 24 hr tablet Take 1 tablet (150 mg total) by mouth daily. 90 tablet 0   [START ON 01/18/2022] clonazePAM (KLONOPIN) 0.5 MG tablet Take 1 tablet (0.5 mg total) by mouth 3 (three) times daily. Take 1 mg tab at bedtime 90 tablet 4   clonazePAM (KLONOPIN) 1 MG tablet Take 1 tablet (1 mg total) by mouth at bedtime. 30 tablet 5   cloNIDine (CATAPRES) 0.1 MG tablet Take 1 tablet (0.1 mg total) by mouth at bedtime. 90 tablet 1   No current facility-administered medications for this visit.    Medication Side Effects: None  Allergies:  Allergies  Allergen Reactions   Promethazine     Other reaction(s): Agitation   Ciprofloxacin Other (See Comments)    c-diff   Dilaudid [Hydromorphone]     sob   Epinephrine    Penicillins Hives    Has patient had a PCN reaction causing immediate rash, facial/tongue/throat swelling, SOB or lightheadedness with hypotension: Yes Has patient had a PCN reaction causing severe rash involving mucus membranes or skin necrosis: No Has patient had a PCN reaction that required hospitalization: No Has patient had a PCN reaction occurring within the last 10 years: Yes If all of the above answers are "NO", then may proceed with Cephalosporin use.    Toradol [Ketorolac Tromethamine] Nausea And Vomiting    Past Medical History:  Diagnosis Date   Anorexia    at 89   Anxiety    Attention deficit disorder (ADD)    Bone spur    Depression    Eczema    HPV (human papilloma virus) infection     Past Medical History, Surgical history, Social history, and Family history were reviewed and updated as  appropriate.   Please see review of systems for further details on the patient's review from today.   Objective:   Physical Exam:  BP 101/75   Pulse 81   Wt 153 lb (69.4 kg)   BMI 22.59 kg/m   Physical Exam Constitutional:      General: She is not in acute distress. Musculoskeletal:        General: No deformity.  Neurological:     Mental Status: She is alert and oriented to person, place, and time.     Coordination: Coordination normal.  Psychiatric:        Attention and Perception: Attention and perception normal. She does not perceive auditory or visual hallucinations.        Mood and Affect:  Mood normal. Mood is not anxious or depressed. Affect is not labile, blunt, angry or inappropriate.        Speech: Speech normal.        Behavior: Behavior normal.        Thought Content: Thought content normal. Thought content is not paranoid or delusional. Thought content does not include homicidal or suicidal ideation. Thought content does not include homicidal or suicidal plan.        Cognition and Memory: Cognition and memory normal.        Judgment: Judgment normal.     Comments: Insight intact     Lab Review:     Component Value Date/Time   NA 139 06/26/2017 0025   NA 138 08/31/2011 1624   K 3.7 06/26/2017 0025   K 4.1 08/31/2011 1624   CL 105 06/26/2017 0025   CL 102 08/31/2011 1624   CO2 25 06/26/2017 0025   CO2 27 08/31/2011 1624   GLUCOSE 110 (H) 06/26/2017 0025   GLUCOSE 81 08/31/2011 1624   BUN 9 06/26/2017 0025   BUN 10 08/31/2011 1624   CREATININE 0.88 06/26/2017 0025   CREATININE 0.88 08/31/2011 1624   CALCIUM 9.1 06/26/2017 0025   CALCIUM 9.1 08/31/2011 1624   PROT 7.4 06/26/2017 0025   PROT 7.8 08/31/2011 1624   ALBUMIN 4.5 06/26/2017 0025   ALBUMIN 4.0 08/31/2011 1624   AST 19 06/26/2017 0025   AST 25 08/31/2011 1624   ALT 14 06/26/2017 0025   ALT 24 08/31/2011 1624   ALKPHOS 49 06/26/2017 0025   ALKPHOS 52 08/31/2011 1624   BILITOT 0.5  06/26/2017 0025   BILITOT 0.3 08/31/2011 1624   GFRNONAA >60 06/26/2017 0025   GFRNONAA >60 08/31/2011 1624   GFRAA >60 06/26/2017 0025   GFRAA >60 08/31/2011 1624       Component Value Date/Time   WBC 6.4 06/26/2017 0025   RBC 3.85 06/26/2017 0025   HGB 12.1 06/26/2017 0025   HGB 12.7 08/31/2011 1624   HCT 34.8 (L) 06/26/2017 0025   HCT 37.7 08/31/2011 1624   PLT 246 06/26/2017 0025   PLT 248 08/31/2011 1624   MCV 90.3 06/26/2017 0025   MCV 90 08/31/2011 1624   MCH 31.5 06/26/2017 0025   MCHC 34.9 06/26/2017 0025   RDW 11.9 06/26/2017 0025   RDW 12.6 08/31/2011 1624   LYMPHSABS 2.2 04/07/2017 1145   MONOABS 0.4 04/07/2017 1145   EOSABS 0.1 04/07/2017 1145   BASOSABS 0.0 04/07/2017 1145    No results found for: "POCLITH", "LITHIUM"   No results found for: "PHENYTOIN", "PHENOBARB", "VALPROATE", "CBMZ"   .res Assessment: Plan:    Pt seen for 30 minutes and time spent discussing response to increase in Rexulti and reviewing changes to medical history. She reports that increase in Rexulti to 2 mg po qd has been helpful for her mood and anxiety symptoms and would like to continue current plan of care without changes.  Will change Clonidine script to 0.1 mg po QHS since she reports dizziness if she takes 1/2 tab as needed during the daytime for anxiety.  Continue Wellbutrin XL 150 mg po qd for depression.  Continue Clonazepam 0.5 mg po TID for anxiety.  Continue Clonazepam 1 mg po QHS for insomnia.  Recommend continuing therapy with Stevphen Meuse, [facility].  Pt to follow-up in 2 months or sooner if clinically indicated.  Patient advised to contact office with any questions, adverse effects, or acute worsening in signs and symptoms.  Helaine was seen today for follow-up.  Diagnoses and all orders for this visit:  Mild mood disorder (HCC) -     brexpiprazole (REXULTI) 2 MG TABS tablet; Take 1 tablet (2 mg total) by mouth daily. -     buPROPion (WELLBUTRIN XL) 150 MG 24 hr  tablet; Take 1 tablet (150 mg total) by mouth daily.  Generalized anxiety disorder -     clonazePAM (KLONOPIN) 0.5 MG tablet; Take 1 tablet (0.5 mg total) by mouth 3 (three) times daily. Take 1 mg tab at bedtime -     cloNIDine (CATAPRES) 0.1 MG tablet; Take 1 tablet (0.1 mg total) by mouth at bedtime.  PTSD (post-traumatic stress disorder) -     clonazePAM (KLONOPIN) 0.5 MG tablet; Take 1 tablet (0.5 mg total) by mouth 3 (three) times daily. Take 1 mg tab at bedtime -     cloNIDine (CATAPRES) 0.1 MG tablet; Take 1 tablet (0.1 mg total) by mouth at bedtime.     Please see After Visit Summary for patient specific instructions.  Future Appointments  Date Time Provider Department Center  12/24/2021 12:00 PM Stevphen Meuse, [facility] None  01/04/2022  2:00 PM Stevphen Meuse, [facility] CP-CP None  01/18/2022  2:00 PM Stevphen Meuse, [facility] CP-CP None  02/01/2022  2:00 PM Stevphen Meuse, [facility] CP-CP None  02/15/2022  2:00 PM Stevphen Meuse, [facility] CP-CP None  03/03/2022  2:00 PM Stevphen Meuse, [facility] CP-CP None  03/03/2022  3:30 PM Corie Chiquito, [facility] CP-CP None    No orders of the defined types were placed in this encounter.   -------------------------------

## 2021-12-23 DIAGNOSIS — M5416 Radiculopathy, lumbar region: Secondary | ICD-10-CM | POA: Diagnosis not present

## 2021-12-24 ENCOUNTER — Ambulatory Visit: Payer: PRIVATE HEALTH INSURANCE | Admitting: Psychiatry

## 2021-12-24 DIAGNOSIS — F4310 Post-traumatic stress disorder, unspecified: Secondary | ICD-10-CM

## 2021-12-24 NOTE — Progress Notes (Signed)
[facility] Counselor/Therapist Progress Note  Patient ID: Peggy Ruiz, MRN: [id number],    Date: 12/24/2021  Time Spent: 58  minutes start time 12:00 PM end time 12:58 PM  Treatment Type: Individual Therapy  Reported Symptoms: anxiety, panic, triggered responses, rumination, irritability, chronic pain, 10 lb weight loss due to lack of appetite, sleep issues   Mental Status Exam:  Appearance:   Well Groomed     Behavior:  Appropriate  Motor:  Normal  Speech/Language:   Normal Rate  Affect:  Appropriate  Mood:  anxious and irritable  Thought process:  normal  Thought content:    WNL  Sensory/Perceptual disturbances:    pain  Orientation:  oriented to person, place, time/date, and situation  Attention:  Good  Concentration:  Good  Memory:  WNL  Fund of knowledge:   Good  Insight:    Good  Judgment:   Good  Impulse Control:  Good   Risk Assessment: Danger to Self:  No Self-injurious Behavior: No Danger to Others: No Duty to Warn:no Physical Aggression / Violence:No  Access to Firearms a concern: No  Gang Involvement:No   Subjective: Patient was present for session.  Patient reported that plans from last session were helpful and she and her husband are communicating better which has helped to decrease some of her panic.  She is in extreme pain with her back and the only thing she can do currently is injections which is hard on her body. She shared that her muscle pain may be there as long as 6 months. Her husband has to work more than usual so she is alone most of the time and that has been hard. He is looking for another job.She went on to share his anxiety has increased greatly and she has had to go into his therapy with him due to the unhealthy work environment. She shared she is having to force herself to eat due to not being hungry. She is going to see her brother tomorrow for his [place] Ouch which is a lot. She is hopeful that he doesn't act out during the dinner. She  went on to share that she had a good weekend with her with Aunt and that was a good thing for her.  Patient stated she needed to work on husband having a 16-hour work day, suds level 10, negative cognition "I am not safe" felt anxiety and sadness in her stomach.  Patient was able to reduce suds level to 5.  She was able to recognize that she has been ruminating about all the bad things that have happened when she was alone.  Discussed the importance of affirming herself and trying to find things that help her to feel safe even when she is by herself.  Discussed the possibility of getting a service dog to help with her physical issues as well as her PTSD.  Interventions: Solution-Oriented/Positive Psychology, Eye Movement Desensitization and Reprocessing (EMDR), Insight-Oriented, and BSP  Diagnosis:   ICD-10-CM   1. Posttraumatic stress disorder  F43.10       Plan: Patient is to use CBT and coping skills to decrease triggered responses.  Patient is to continue communicating with her husband and utilizing handouts given to her in previous session.  Patient is to work on affirming herself regularly that she is safe and thinking through what to do to help her feel safe.  Patient is to start looking into the possibility of a service dog.  Patient is to  work on Psychologist, sport and exercise on her mirror to repeat regularly.  Patient is to take things in small steps so that she is able to accomplish tasks.  Patient is to take medication as directed.   Patient is to work on focusing on the things that she can accomplish.  Patient is to work on Dr. Rayfield Citizen leaf on the neuro cycle detox.  Patient is to continue working with providers on medical issues. Long-term goal: Interact normally with friends and family without irrational fears or intrusive thoughts that control behavior.  Display a full range of emotions without experiencing loss of control Short-term goal: Identify and replace negative self talk and catastrophic  sizing that is associated with past trauma and current stimuli triggers for anxiety  Stevphen Meuse, [facility]

## 2022-01-04 ENCOUNTER — Ambulatory Visit: Payer: PRIVATE HEALTH INSURANCE | Admitting: Psychiatry

## 2022-01-04 DIAGNOSIS — F4310 Post-traumatic stress disorder, unspecified: Secondary | ICD-10-CM

## 2022-01-04 NOTE — Progress Notes (Signed)
[facility] Counselor/Therapist Progress Note  Patient ID: MAGENTA SCHMIESING, MRN: [id number],    Date: 01/04/2022  Time Spent: 55 minutes start time 2:07 PM end time 3:02 PM  Treatment Type: Individual Therapy  Reported Symptoms: pain issues, sadness, anxiety,triggered responses, fatigue, health issues  Mental Status Exam:  Appearance:   Well Groomed     Behavior:  Appropriate  Motor:  Normal  Speech/Language:   Normal Rate  Affect:  Appropriate  Mood:  anxious  Thought process:  normal  Thought content:    WNL  Sensory/Perceptual disturbances:    WNL  Orientation:  oriented to person, place, time/date, and situation  Attention:  Good  Concentration:  Good  Memory:  WNL  Fund of knowledge:   Good  Insight:    Good  Judgment:   Good  Impulse Control:  Good   Risk Assessment: Danger to Self:  No Self-injurious Behavior: No Danger to Others: No Duty to Warn:no Physical Aggression / Violence:No  Access to Firearms a concern: No  Gang Involvement:No   Subjective: Patient was present for session.  She shared that she did get her injections and they have helped some but she is still having some pain issues.  She was able to connect with an old friend which was positive.  She and her husband are considering fostering to adoption of a child.She shared that it is something that she is excited about and she has wanted since she was 77.  She also shared that Brian's [place] Ouch was triggering due to his behaviors. She shared there was an incident with Arlys John but she was able to handle it calmly which was progress.  Patient stated that the thing creating the most stress for her was her husband's job and keeping him out late.  Did processing set on [place] working late, suds level 8, negative cognition "I am all alone" felt anxious and fear in her stomach.  Patient was able to reduce suds level to 3.  She was able to recognize that she is not alone because Selena Batten does come home and she does have  other people she can connect with while he is at work.  Discussed ways that she can work on keeping her self engaged in connecting with others while her husband works late so she does not get overwhelmed with panic and anxiety.  Interventions: Solution-Oriented/Positive Psychology, Eye Movement Desensitization and Reprocessing (EMDR), and Insight-Oriented  Diagnosis:   ICD-10-CM   1. PTSD (post-traumatic stress disorder)  F43.10       Plan: Patient is to use CBT and coping skills to decrease triggered responses.  Patient is to continue communicating with her husband and utilizing handouts given to her in previous session.  Patient is to work on affirming herself regularly that she is safe and thinking through what to do to help her feel safe.  Patient is to start looking into the possibility of a service dog.  Patient is to work on Psychologist, sport and exercise on her mirror to repeat regularly.  Patient is to take things in small steps so that she is able to accomplish tasks.  Patient is to take medication as directed.   Patient is to work on focusing on the things that she can accomplish.  Patient is to work on Dr. Rayfield Citizen leaf on the neuro cycle detox.  Patient is to continue working with providers on medical issues. Long-term goal: Interact normally with friends and family without irrational fears or intrusive thoughts that  control behavior.  Display a full range of emotions without experiencing loss of control Short-term goal: Identify and replace negative self talk and catastrophic sizing that is associated with past trauma and current stimuli triggers for anxiety  Stevphen Meuse, [facility]

## 2022-01-16 DIAGNOSIS — M62838 Other muscle spasm: Secondary | ICD-10-CM | POA: Diagnosis not present

## 2022-01-16 DIAGNOSIS — S46811A Strain of other muscles, fascia and tendons at shoulder and upper arm level, right arm, initial encounter: Secondary | ICD-10-CM | POA: Diagnosis not present

## 2022-01-17 DIAGNOSIS — Z30430 Encounter for insertion of intrauterine contraceptive device: Secondary | ICD-10-CM | POA: Diagnosis not present

## 2022-01-17 DIAGNOSIS — Z309 Encounter for contraceptive management, unspecified: Secondary | ICD-10-CM | POA: Diagnosis not present

## 2022-01-18 ENCOUNTER — Ambulatory Visit: Payer: PRIVATE HEALTH INSURANCE | Admitting: Psychiatry

## 2022-01-18 DIAGNOSIS — F4310 Post-traumatic stress disorder, unspecified: Secondary | ICD-10-CM

## 2022-01-18 NOTE — Progress Notes (Signed)
[facility] Counselor/Therapist Progress Note  Patient ID: Peggy Ruiz, MRN: [id number],    Date: 01/18/2022  Time Spent: 52 minutes start time 2:05 PM end time 2:57 PM  Treatment Type: Individual Therapy  Reported Symptoms: chronic pain issues,anxiety, sadness, crying spells, fatigue, irritability  Mental Status Exam:  Appearance:   Well Groomed     Behavior:  Appropriate  Motor:  Normal  Speech/Language:   Normal Rate  Affect:  Appropriate  Mood:  anxious  Thought process:  normal  Thought content:    WNL  Sensory/Perceptual disturbances:    Pain  Orientation:  oriented to person, place, time/date, and situation  Attention:  Good  Concentration:  Good  Memory:  WNL  Fund of knowledge:   Good  Insight:    Good  Judgment:   Good  Impulse Control:  Good   Risk Assessment: Danger to Self:  No Self-injurious Behavior: No Danger to Others: No Duty to Warn:no Physical Aggression / Violence:No  Access to Firearms a concern: No  Gang Involvement:No   Subjective: Patient was present for session. She shared she had to go to urgent care due to hurting her shoulder.  She also cracked her crown on her tooth.  She has tried to have a shot in her back but it did not help.  She went on to share she id going back to him on Friday to try and figure it out. Her husband has been working 15 hours a day on Sundays and that has been hard. She shared she has been managing the Sundays which is progress.  She is also feeling better overall which has been good for her.  Patient shared she is finally felt more comfortable reaching out to people and is trying to think engage with others rather than staying in her house too much.  She is starting to remember things from previous sessions and we apply them to her situation currently and she has found things to be very helpful.  She is also keeping herself more engaged in positive self talk and looking forward to going on vacation was just she and  her husband.  Patient was encouraged to continue utilizing all skills and tools that she is finding helpful over the next few weeks.  Interventions: Cognitive Behavioral Therapy, Solution-Oriented/Positive Psychology, and Insight-Oriented  Diagnosis:   ICD-10-CM   1. PTSD (post-traumatic stress disorder)  F43.10       Plan: Patient is to use CBT and coping skills to decrease triggered responses.  Patient is to continue communicating with her husband and utilizing handouts given to her in previous session.  Patient is to work on affirming herself regularly that she is safe and thinking through what to do to help her feel safe.  Patient is to start looking into the possibility of a service dog.  Patient is to work on Psychologist, sport and exercise on her mirror to repeat regularly.  Patient is to take things in small steps so that she is able to accomplish tasks.  Patient is to take medication as directed.   Patient is to work on focusing on the things that she can accomplish.  Patient is to work on Dr. Rayfield Citizen leaf on the neuro cycle detox.  Patient is to continue working with providers on medical issues. Long-term goal: Interact normally with friends and family without irrational fears or intrusive thoughts that control behavior.  Display a full range of emotions without experiencing loss of control Short-term goal: Identify  and replace negative self talk and catastrophic sizing that is associated with past trauma and current stimuli triggers for anxiety  Stevphen Meuse, [facility]

## 2022-01-21 DIAGNOSIS — M461 Sacroiliitis, not elsewhere classified: Secondary | ICD-10-CM | POA: Diagnosis not present

## 2022-01-21 DIAGNOSIS — M961 Postlaminectomy syndrome, not elsewhere classified: Secondary | ICD-10-CM | POA: Diagnosis not present

## 2022-01-30 DIAGNOSIS — M961 Postlaminectomy syndrome, not elsewhere classified: Secondary | ICD-10-CM | POA: Diagnosis not present

## 2022-01-30 DIAGNOSIS — M5416 Radiculopathy, lumbar region: Secondary | ICD-10-CM | POA: Diagnosis not present

## 2022-01-31 DIAGNOSIS — M2569 Stiffness of other specified joint, not elsewhere classified: Secondary | ICD-10-CM | POA: Diagnosis not present

## 2022-01-31 DIAGNOSIS — M799 Soft tissue disorder, unspecified: Secondary | ICD-10-CM | POA: Diagnosis not present

## 2022-01-31 DIAGNOSIS — M545 Low back pain, unspecified: Secondary | ICD-10-CM | POA: Diagnosis not present

## 2022-01-31 DIAGNOSIS — M6281 Muscle weakness (generalized): Secondary | ICD-10-CM | POA: Diagnosis not present

## 2022-02-01 ENCOUNTER — Ambulatory Visit: Payer: PRIVATE HEALTH INSURANCE | Admitting: Psychiatry

## 2022-02-01 DIAGNOSIS — F4310 Post-traumatic stress disorder, unspecified: Secondary | ICD-10-CM | POA: Diagnosis not present
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
## 2022-02-02 DIAGNOSIS — M545 Low back pain, unspecified: Secondary | ICD-10-CM | POA: Diagnosis not present

## 2022-02-02 DIAGNOSIS — M799 Soft tissue disorder, unspecified: Secondary | ICD-10-CM | POA: Diagnosis not present

## 2022-02-02 DIAGNOSIS — M6281 Muscle weakness (generalized): Secondary | ICD-10-CM | POA: Diagnosis not present

## 2022-02-02 DIAGNOSIS — M2569 Stiffness of other specified joint, not elsewhere classified: Secondary | ICD-10-CM | POA: Diagnosis not present

## 2022-02-07 DIAGNOSIS — M2569 Stiffness of other specified joint, not elsewhere classified: Secondary | ICD-10-CM | POA: Diagnosis not present

## 2022-02-07 DIAGNOSIS — M6281 Muscle weakness (generalized): Secondary | ICD-10-CM | POA: Diagnosis not present

## 2022-02-07 DIAGNOSIS — M545 Low back pain, unspecified: Secondary | ICD-10-CM | POA: Diagnosis not present

## 2022-02-07 DIAGNOSIS — M799 Soft tissue disorder, unspecified: Secondary | ICD-10-CM | POA: Diagnosis not present

## 2022-02-09 DIAGNOSIS — M799 Soft tissue disorder, unspecified: Secondary | ICD-10-CM | POA: Diagnosis not present

## 2022-02-09 DIAGNOSIS — M545 Low back pain, unspecified: Secondary | ICD-10-CM | POA: Diagnosis not present

## 2022-02-09 DIAGNOSIS — M2569 Stiffness of other specified joint, not elsewhere classified: Secondary | ICD-10-CM | POA: Diagnosis not present

## 2022-02-09 DIAGNOSIS — M6281 Muscle weakness (generalized): Secondary | ICD-10-CM | POA: Diagnosis not present

## 2022-02-14 DIAGNOSIS — M545 Low back pain, unspecified: Secondary | ICD-10-CM | POA: Diagnosis not present

## 2022-02-14 DIAGNOSIS — M799 Soft tissue disorder, unspecified: Secondary | ICD-10-CM | POA: Diagnosis not present

## 2022-02-14 DIAGNOSIS — M6281 Muscle weakness (generalized): Secondary | ICD-10-CM | POA: Diagnosis not present

## 2022-02-14 DIAGNOSIS — M2569 Stiffness of other specified joint, not elsewhere classified: Secondary | ICD-10-CM | POA: Diagnosis not present

## 2022-02-15 ENCOUNTER — Ambulatory Visit: Payer: PRIVATE HEALTH INSURANCE | Admitting: Psychiatry

## 2022-02-15 DIAGNOSIS — F4310 Post-traumatic stress disorder, unspecified: Secondary | ICD-10-CM | POA: Diagnosis not present

## 2022-02-15 NOTE — Progress Notes (Signed)
[facility] Counselor/Therapist Progress Note  Patient ID: Peggy Ruiz, MRN: [id number],    Date: 02/15/2022  Time Spent: 50 minutes start time 2:10 PM end time 3 PM  Treatment Type: Individual Therapy  Reported Symptoms: anxiety, sadness, frustration, triggered responses,, panic  Mental Status Exam:  Appearance:   Well Groomed     Behavior:  Appropriate  Motor:  Normal  Speech/Language:   Normal Rate  Affect:  Appropriate  Mood:  anxious and sad  Thought process:  normal  Thought content:    WNL  Sensory/Perceptual disturbances:    WNL  Orientation:  oriented to person, place, time/date, and situation  Attention:  Good  Concentration:  Good  Memory:  WNL  Fund of knowledge:   Good  Insight:    Good  Judgment:   Good  Impulse Control:  Good   Risk Assessment: Danger to Self:  No Self-injurious Behavior: No Danger to Others: No Duty to Warn:no Physical Aggression / Violence:No  Access to Firearms a concern: No  Gang Involvement:No   Subjective: Patient was present for session. She shared her husband is taking a new position. She went on to share that it is back in the area. She went on to share that his job is 50% travel to [place].  She shared it is going to been weeks at home and weeks in [place].  She shared she is feeling all sorts of emotions. Her parents are hoping to help patient but her father is going to be out for surgery in September than her mother is having back surgery. Her in laws are being difficult but may help her some.  Patient went on to share that she is continuing to pursue adoption of a child.  She was able to recognize there were multiple different categories that she could not have as a child and made sure she checked those marks.  Patient was able to share things that she is doing to prepare herself to be on her own for a large period of time.  Patient also reported that she is working on her thoughts and trying to remind herself of what she can  do and how she can handle that rather than getting overwhelmed.  Patient agreed to continue trying to process what she will need to do to feel okay on her own once her husband takes his new position.  Interventions: Cognitive Behavioral Therapy and Solution-Oriented/Positive Psychology  Diagnosis:   ICD-10-CM   1. PTSD (post-traumatic stress disorder)  F43.10       Plan:  Patient is to use CBT and coping skills to decrease triggered responses.  Patient is to work on figuring out CBT filters to utilize once husband is at his new position and gone for long periods of time.  Patient is to work on affirming herself regularly that she is safe and thinking through what to do to help her feel safe.  Patient is to start looking into the possibility of a service dog.  Patient is to work on Psychologist, sport and exercise on her mirror to repeat regularly.  Patient is to take things in small steps so that she is able to accomplish tasks.  Patient is to take medication as directed.   Patient is to work on focusing on the things that she can accomplish.  Patient is to work on Dr. Rayfield Citizen leaf on the neuro cycle detox.  Patient is to continue working with providers on medical issues. Long-term goal: Interact normally  with friends and family without irrational fears or intrusive thoughts that control behavior.  Display a full range of emotions without experiencing loss of control Short-term goal: Identify and replace negative self talk and catastrophic sizing that is associated with past trauma and current stimuli triggers for anxiety    Stevphen Meuse, [facility]

## 2022-02-16 DIAGNOSIS — M545 Low back pain, unspecified: Secondary | ICD-10-CM | POA: Diagnosis not present

## 2022-02-16 DIAGNOSIS — M799 Soft tissue disorder, unspecified: Secondary | ICD-10-CM | POA: Diagnosis not present

## 2022-02-16 DIAGNOSIS — M2569 Stiffness of other specified joint, not elsewhere classified: Secondary | ICD-10-CM | POA: Diagnosis not present

## 2022-02-16 DIAGNOSIS — M6281 Muscle weakness (generalized): Secondary | ICD-10-CM | POA: Diagnosis not present

## 2022-02-21 DIAGNOSIS — M2569 Stiffness of other specified joint, not elsewhere classified: Secondary | ICD-10-CM | POA: Diagnosis not present

## 2022-02-21 DIAGNOSIS — M799 Soft tissue disorder, unspecified: Secondary | ICD-10-CM | POA: Diagnosis not present

## 2022-02-21 DIAGNOSIS — M6281 Muscle weakness (generalized): Secondary | ICD-10-CM | POA: Diagnosis not present

## 2022-02-21 DIAGNOSIS — M545 Low back pain, unspecified: Secondary | ICD-10-CM | POA: Diagnosis not present

## 2022-02-23 DIAGNOSIS — M545 Low back pain, unspecified: Secondary | ICD-10-CM | POA: Diagnosis not present

## 2022-02-23 DIAGNOSIS — M799 Soft tissue disorder, unspecified: Secondary | ICD-10-CM | POA: Diagnosis not present

## 2022-02-23 DIAGNOSIS — M6281 Muscle weakness (generalized): Secondary | ICD-10-CM | POA: Diagnosis not present

## 2022-02-23 DIAGNOSIS — M2569 Stiffness of other specified joint, not elsewhere classified: Secondary | ICD-10-CM | POA: Diagnosis not present

## 2022-02-24 DIAGNOSIS — E039 Hypothyroidism, unspecified: Secondary | ICD-10-CM | POA: Diagnosis not present

## 2022-03-01 DIAGNOSIS — Z0000 Encounter for general adult medical examination without abnormal findings: Secondary | ICD-10-CM | POA: Diagnosis not present

## 2022-03-01 DIAGNOSIS — F419 Anxiety disorder, unspecified: Secondary | ICD-10-CM | POA: Diagnosis not present

## 2022-03-01 DIAGNOSIS — Z712 Person consulting for explanation of examination or test findings: Secondary | ICD-10-CM | POA: Diagnosis not present

## 2022-03-02 DIAGNOSIS — R87619 Unspecified abnormal cytological findings in specimens from cervix uteri: Secondary | ICD-10-CM | POA: Diagnosis not present

## 2022-03-03 ENCOUNTER — Ambulatory Visit: Payer: PRIVATE HEALTH INSURANCE | Admitting: Psychiatry

## 2022-03-03 ENCOUNTER — Encounter: Payer: Self-pay | Admitting: Psychiatry

## 2022-03-03 VITALS — Wt 147.0 lb

## 2022-03-03 DIAGNOSIS — F4310 Post-traumatic stress disorder, unspecified: Secondary | ICD-10-CM

## 2022-03-03 DIAGNOSIS — G4700 Insomnia, unspecified: Secondary | ICD-10-CM

## 2022-03-03 DIAGNOSIS — F411 Generalized anxiety disorder: Secondary | ICD-10-CM

## 2022-03-03 DIAGNOSIS — F3342 Major depressive disorder, recurrent, in full remission: Secondary | ICD-10-CM

## 2022-03-03 MED ORDER — BREXPIPRAZOLE 2 MG PO TABS: 2.0000 mg | ORAL_TABLET | Freq: Every day | ORAL | 0 refills | Status: DC

## 2022-03-03 MED ORDER — BUPROPION HCL ER (XL) 150 MG PO TB24: 150.0000 mg | ORAL_TABLET | Freq: Every day | ORAL | 0 refills | Status: DC

## 2022-03-03 NOTE — Progress Notes (Signed)
[facility] Counselor/Therapist Progress Note  Patient ID: Peggy Ruiz, MRN: [id number],    Date: 03/03/2022  Time Spent: 60 minutes start time 2:12 PM end time 3:12 PM  Treatment Type: Individual Therapy  Reported Symptoms: crying spells, fatigue, anxiety, sadness, triggered responses, health issues  Mental Status Exam:  Appearance:   Well Groomed     Behavior:  Appropriate  Motor:  Normal  Speech/Language:   Normal Rate  Affect:  Appropriate  Mood:  anxious  Thought process:  normal  Thought content:    WNL  Sensory/Perceptual disturbances:    WNL  Orientation:  oriented to person, place, time/date, and situation  Attention:  Good  Concentration:  Good  Memory:  WNL  Fund of knowledge:   Good  Insight:    Good  Judgment:   Good  Impulse Control:  Good   Risk Assessment: Danger to Self:  No Self-injurious Behavior: No Danger to Others: No Duty to Warn:no Physical Aggression / Violence:No  Access to Firearms a concern: No  Gang Involvement:No   Subjective: Patient was present for session.  She shared that she has been struggling with her husband taking the new job and knowing that he will be gone more when he starts the job. She is still going through the fostering program and preparing for a child.She shared that she has questions about what she can do concerning parenting when her [place] is gone [place]. Discussed treatment goals and she was able to report that she is improving on her goals.  She was able to report that she still has things to work on but she has made great progress. She reported not having flashbacks for a long time.  She has also been able to maintain when her husband works long hours and keep herself at a good place.  Patient was encouraged to think through what was helping her and what she has learned in this journey.  Patient stated she feels the EMDR has been very beneficial and she has worked through many of the past traumas with Rosanne Ashing and  Jill Alexanders.  She shared she has been able to talk about both of them without getting triggered which was huge.  She was also able to remove herself when Arlys John started getting very agitated at the last family outing.  She shared that her parents were concerned that she left the room but when she explained she did it to make sure she was at a good place that they were encouraging and affirming.  Patient shared she is noticing her biggest issue is fatigue in the morning and she is going to be talking to her provider about that issue.  She went on to explain she recognized that she is able to work through it when her husband is home and she is wondering if having another person in the house will be helpful and she will be able to push through the lag/fatigue she fills first thing in the morning.  Was encouraged to read the book have a new Kid by Friday by Flonnie Overman.  She was encouraged to continue seeking support in the community.  Patient is also to continue using coping skills that have been helpful for her.  Patient stated that since she will be without insurance for a couple of months she will not be coming to treatment until she has insurance again.  Encouraged patient to practice all skills discussed in previous sessions and to work on healthy self-care  daily.  Interventions: Solution-Oriented/Positive Psychology and Insight-Oriented  Diagnosis:   ICD-10-CM   1. Posttraumatic stress disorder  F43.10       Plan:  Patient is to use CBT and coping skills to decrease triggered responses.  Patient is to work on figuring out CBT filters to utilize once husband is at his new position and gone for long periods of time.  Patient is to work on affirming herself regularly that she is safe and thinking through what to do to help her feel safe.  Patient is to start looking into the possibility of a service dog.  Patient is to work on Psychologist, sport and exercise on her mirror to repeat regularly.  Patient is to take things in  small steps so that she is able to accomplish tasks.  Patient is to take medication as directed.   Patient is to work on focusing on the things that she can accomplish.  Patient is to work on Dr. Rayfield Citizen leaf on the neuro cycle detox.  Patient is to continue working with providers on medical issues. Long-term goal: Interact normally with friends and family without irrational fears or intrusive thoughts that control behavior.  Display a full range of emotions without experiencing loss of control Short-term goal: Identify and replace negative self talk and catastrophic sizing that is associated with past trauma and current stimuli triggers for anxiety  Stevphen Meuse, [facility]

## 2022-03-03 NOTE — Progress Notes (Signed)
Peggy Ruiz [id number] Nov 20, 1985 36 y.o.  Subjective:   Patient ID:  Peggy Ruiz is a 36 y.o. (DOB Oct 10, 1985) female.  Chief Complaint:  Chief Complaint  Patient presents with   Follow-up    Anxiety, depression, and insomnia    HPI Peggy Ruiz presents to the office today for follow-up of anxiety, depression, and insomnia.   She and her husband are in the process of fostering a child with possible adoption. They have completed Phase 1. She reports that they process typically takes 6 months and they initiated the process about 4 weeks ago. She reports that they have been contemplating this for a few years.   Husband has a new job at [employer] in [place], [place] and will have a 1.5 hour commute. Husband will be traveling 50% of the time to [place] for the next 2 years. She reports that husband is excited about the job. Tomorrow is husband's last day at current job and starting new job Monday.   She reports that she has not experienced any recent panic attacks. She has been off Clonidine. She reports that she has been having some excessive somnolence in the morning.   Taking Klonopin 1.5 mg at bedtime and this has been helpful for her sleep but may be causing excessive somnolence. She tried to reduce Klonopin to 1 mg and had difficulty falling asleep. She reports that she is sleeping well with this. She has been taking Klonopin 0.5 mg in the morning and mid-day.   She reports that Rexulti has been helpful overall for her mood and anxiety. Denies depressed mood. She reports some "nervous excitement" with husband's job change and fostering. She has lost weight since switching from Olanzapine to Rexulti. She reports that she is not excessively hungry or needing to snack at night. She is no longer craving sweets and carbs and instead eating healthier foods. Energy and motivation are good after initial grogginess wears off. Concentration is adequate. Denies SI.   Klonopin 1 mg last filled 02/19/22 x  4. Klonopin 0.5 mg last filled 02/19/22 x 2.  Anticipates a lapse in insurance for a month while husband transitions jobs. New insurance will be effective on 04/07/22.   Past Psychiatric Medication Trials: Cymbalta- Effective for a period of time and then no longer as effective.  Prozac Lexapro Paxil- Initially helped and then did not seem to be as effective when used without Zyprexa Effexor Nortriptyline-prescribed by GI for gastroparesis Remeron-caused insomnia Buspar- Adverse reaction Abilify-involuntary movements Zyprexa-effective and took for about 14 years.  Caused excessive weight gain at times. Seroquel-Ineffective. Felt like a "zombie."  Latuda-effective. Sleeping "harder" on 80 mg. Lamictal Gabapentin- Cannot tolerate doses higher than 800 mg BID Trileptal- Reports that she was unable to tolerate 900 mg QHS Belsomra-helpful with sleep quality but not sleep quantity. Klonopin Ativan Librium Clonidine- Effective Propranolol Trazodone Ambien Nuvigil Adderall Deplin  AIMS    Flowsheet Row Office Visit from 03/03/2022 in [facility] Office Visit from 12/21/2021 in [facility] Office Visit from 02/26/2021 in [facility] Office Visit from 12/22/2020 in [facility] Office Visit from 09/02/2020 in [facility]  AIMS Total Score 0 0 0 0 0        Review of Systems:  Review of Systems  Genitourinary:        Improved vaginal bleeding with copper IUD  Musculoskeletal:  Negative for gait problem.  Neurological:  Negative for tremors.  Psychiatric/Behavioral:  Please refer to HPI    Medications: I have reviewed the patient's current medications.  Current Outpatient Medications  Medication Sig Dispense Refill   paragard intrauterine copper IUD IUD 1 each by Intrauterine route once.     brexpiprazole (REXULTI) 2 MG TABS tablet Take 1 tablet (2 mg total) by mouth daily. 90 tablet 0    buPROPion (WELLBUTRIN XL) 150 MG 24 hr tablet Take 1 tablet (150 mg total) by mouth daily. 90 tablet 0   clonazePAM (KLONOPIN) 0.5 MG tablet Take 1 tablet (0.5 mg total) by mouth 3 (three) times daily. Take 1 mg tab at bedtime 90 tablet 4   clonazePAM (KLONOPIN) 1 MG tablet Take 1 tablet (1 mg total) by mouth at bedtime. 30 tablet 5   dexlansoprazole (DEXILANT) 60 MG capsule Take 60 mg by mouth every morning.     famotidine (PEPCID) 20 MG tablet Take 20 mg by mouth 2 (two) times daily.      gabapentin (NEURONTIN) 800 MG tablet Take 800 mg by mouth 2 (two) times daily.     levothyroxine (SYNTHROID, LEVOTHROID) 50 MCG tablet Take 50 mcg by mouth daily.     Lifitegrast (XIIDRA) 5 % SOLN Apply to eye.     magnesium 30 MG tablet Take 30 mg by mouth 2 (two) times daily.     morphine (MSIR) 15 MG tablet morphine 15 mg immediate release tablet  Take 1 tablet 3 times a day by oral route as needed.     Multiple Vitamin (MULTIVITAMIN) tablet Take 1 tablet by mouth daily.     nortriptyline (PAMELOR) 10 MG capsule Take 1 capsule (10 mg total) by mouth at bedtime. 90 capsule 0   ondansetron (ZOFRAN-ODT) 8 MG disintegrating tablet Take 8 mg by mouth every 8 (eight) hours as needed for nausea.     No current facility-administered medications for this visit.    Medication Side Effects: Sedation  Allergies:  Allergies  Allergen Reactions   Promethazine     Other reaction(s): Agitation   Ciprofloxacin Other (See Comments)    c-diff   Dilaudid [Hydromorphone]     sob   Epinephrine    Penicillins Hives    Has patient had a PCN reaction causing immediate rash, facial/tongue/throat swelling, SOB or lightheadedness with hypotension: Yes Has patient had a PCN reaction causing severe rash involving mucus membranes or skin necrosis: No Has patient had a PCN reaction that required hospitalization: No Has patient had a PCN reaction occurring within the last 10 years: Yes If all of the above answers are "NO",  then may proceed with Cephalosporin use.    Toradol [Ketorolac Tromethamine] Nausea And Vomiting    Past Medical History:  Diagnosis Date   Anorexia    at 66   Anxiety    Attention deficit disorder (ADD)    Bone spur    Depression    Eczema    HPV (human papilloma virus) infection     Past Medical History, Surgical history, Social history, and Family history were reviewed and updated as appropriate.   Please see review of systems for further details on the patient's review from today.   Objective:   Physical Exam:  Wt 147 lb (66.7 kg)   BMI 21.71 kg/m   Physical Exam Constitutional:      General: She is not in acute distress. Musculoskeletal:        General: No deformity.  Neurological:     Mental Status: She is alert and oriented to  person, place, and time.     Coordination: Coordination normal.  Psychiatric:        Attention and Perception: Attention and perception normal. She does not perceive auditory or visual hallucinations.        Mood and Affect: Mood normal. Mood is not anxious or depressed. Affect is not labile, blunt, angry or inappropriate.        Speech: Speech normal.        Behavior: Behavior normal.        Thought Content: Thought content normal. Thought content is not paranoid or delusional. Thought content does not include homicidal or suicidal ideation. Thought content does not include homicidal or suicidal plan.        Cognition and Memory: Cognition and memory normal.        Judgment: Judgment normal.     Comments: Insight intact     Lab Review:     Component Value Date/Time   NA 139 06/26/2017 0025   NA 138 08/31/2011 1624   K 3.7 06/26/2017 0025   K 4.1 08/31/2011 1624   CL 105 06/26/2017 0025   CL 102 08/31/2011 1624   CO2 25 06/26/2017 0025   CO2 27 08/31/2011 1624   GLUCOSE 110 (H) 06/26/2017 0025   GLUCOSE 81 08/31/2011 1624   BUN 9 06/26/2017 0025   BUN 10 08/31/2011 1624   CREATININE 0.88 06/26/2017 0025   CREATININE 0.88  08/31/2011 1624   CALCIUM 9.1 06/26/2017 0025   CALCIUM 9.1 08/31/2011 1624   PROT 7.4 06/26/2017 0025   PROT 7.8 08/31/2011 1624   ALBUMIN 4.5 06/26/2017 0025   ALBUMIN 4.0 08/31/2011 1624   AST 19 06/26/2017 0025   AST 25 08/31/2011 1624   ALT 14 06/26/2017 0025   ALT 24 08/31/2011 1624   ALKPHOS 49 06/26/2017 0025   ALKPHOS 52 08/31/2011 1624   BILITOT 0.5 06/26/2017 0025   BILITOT 0.3 08/31/2011 1624   GFRNONAA >60 06/26/2017 0025   GFRNONAA >60 08/31/2011 1624   GFRAA >60 06/26/2017 0025   GFRAA >60 08/31/2011 1624       Component Value Date/Time   WBC 6.4 06/26/2017 0025   RBC 3.85 06/26/2017 0025   HGB 12.1 06/26/2017 0025   HGB 12.7 08/31/2011 1624   HCT 34.8 (L) 06/26/2017 0025   HCT 37.7 08/31/2011 1624   PLT 246 06/26/2017 0025   PLT 248 08/31/2011 1624   MCV 90.3 06/26/2017 0025   MCV 90 08/31/2011 1624   MCH 31.5 06/26/2017 0025   MCHC 34.9 06/26/2017 0025   RDW 11.9 06/26/2017 0025   RDW 12.6 08/31/2011 1624   LYMPHSABS 2.2 04/07/2017 1145   MONOABS 0.4 04/07/2017 1145   EOSABS 0.1 04/07/2017 1145   BASOSABS 0.0 04/07/2017 1145    No results found for: "POCLITH", "LITHIUM"   No results found for: "PHENYTOIN", "PHENOBARB", "VALPROATE", "CBMZ"   .res Assessment: Plan:   Pt seen for 40 minutes and time spent discussing insomnia and issue with excessive somnolence with higher doses of Klonopin. Recommended trying to reduce Klonopin from 1.5 mg to 1.25 mg at bedtime for 4 weeks and then attempt to reduce to 1 mg at bedtime since more gradual decrease in dose may be less likely to result in insomnia. Continue klonopin 0.5 mg po BID. Discussed that Rexulti may require a prior authorization when new insurance becomes effective. Pt provided with Rexulti samples to avoid possible lapse in treatment. Continue Rexulti 2 mg po qd for mood symptoms. Continue  Wellbutrin XL 150 mg po qd for depression.  Recommend continuing therapy with Stevphen Meuse, [facility].  Pt to  follow-up in 6-8 weeks or sooner if clinically indicated.  Patient advised to contact office with any questions, adverse effects, or acute worsening in signs and symptoms.   Peggy Ruiz was seen today for follow-up.  Diagnoses and all orders for this visit:  PTSD (post-traumatic stress disorder)  Recurrent major depressive disorder, in full remission (HCC) -     buPROPion (WELLBUTRIN XL) 150 MG 24 hr tablet; Take 1 tablet (150 mg total) by mouth daily. -     brexpiprazole (REXULTI) 2 MG TABS tablet; Take 1 tablet (2 mg total) by mouth daily.  Generalized anxiety disorder  Insomnia, unspecified type     Please see After Visit Summary for patient specific instructions.  Future Appointments  Date Time Provider Department Center  04/05/2022  2:00 PM Stevphen Meuse, [facility] None  04/19/2022  1:00 PM Corie Chiquito, [facility] CP-CP None  04/19/2022  2:00 PM Stevphen Meuse, [facility] CP-CP None  05/03/2022  2:00 PM Stevphen Meuse, [facility] CP-CP None  05/17/2022  2:00 PM Stevphen Meuse, [facility] CP-CP None  05/31/2022 12:00 PM Stevphen Meuse, [facility] CP-CP None    No orders of the defined types were placed in this encounter.   -------------------------------

## 2022-03-04 DIAGNOSIS — M799 Soft tissue disorder, unspecified: Secondary | ICD-10-CM | POA: Diagnosis not present

## 2022-03-04 DIAGNOSIS — M6281 Muscle weakness (generalized): Secondary | ICD-10-CM | POA: Diagnosis not present

## 2022-03-04 DIAGNOSIS — M2569 Stiffness of other specified joint, not elsewhere classified: Secondary | ICD-10-CM | POA: Diagnosis not present

## 2022-03-04 DIAGNOSIS — M545 Low back pain, unspecified: Secondary | ICD-10-CM | POA: Diagnosis not present

## 2022-03-14 ENCOUNTER — Telehealth: Payer: Self-pay | Admitting: Psychiatry

## 2022-03-14 NOTE — Telephone Encounter (Signed)
PT called and said she was told to call and remind you that she wanted a letter written that said she was stable enough to be a foster parent.  She said she will be in town from [place] on Thursday and would like to pick the letter up then.  Next appt

## 2022-03-15 ENCOUNTER — Ambulatory Visit: Payer: PRIVATE HEALTH INSURANCE | Admitting: Psychiatry

## 2022-03-17 NOTE — Telephone Encounter (Signed)
Briah came into office to pick up letter. I informed her Earnest Bailey is on vacation and should be back next week. She's asked if once the letter is written to mail to her address on file.

## 2022-03-23 ENCOUNTER — Ambulatory Visit: Payer: PRIVATE HEALTH INSURANCE | Admitting: Psychiatry

## 2022-04-05 ENCOUNTER — Ambulatory Visit: Payer: PRIVATE HEALTH INSURANCE | Admitting: Psychiatry

## 2022-04-05 DIAGNOSIS — F4310 Post-traumatic stress disorder, unspecified: Secondary | ICD-10-CM

## 2022-04-05 NOTE — Progress Notes (Signed)
[facility] Counselor/Therapist Progress Note  Patient ID: Peggy Ruiz, MRN: [id number],    Date: 04/05/2022  Time Spent: 51 minutes start time 2:05 PM end time 2:06 PM  Treatment Type: Individual Therapy  Reported Symptoms: anxiety, triggered responses, sadness, health issues  Mental Status Exam:  Appearance:   Well Groomed     Behavior:  Appropriate  Motor:  Normal  Speech/Language:   Normal Rate  Affect:  Appropriate  Mood:  anxious  Thought process:  normal  Thought content:    WNL  Sensory/Perceptual disturbances:    WNL  Orientation:  oriented to person, place, time/date, and situation  Attention:  Good  Concentration:  Good  Memory:  WNL  Fund of knowledge:   Good  Insight:    Good  Judgment:   Good  Impulse Control:  Good   Risk Assessment: Danger to Self:  No Self-injurious Behavior: No Danger to Others: No Duty to Warn:no Physical Aggression / Violence:No  Access to Firearms a concern: No  Gang Involvement:No   Subjective: Patient was present for session.  She shared that things are busy due to husband's new job but it is going well.  She shared that he is commuting and that is hard on them so they are going to see what happens and potentially move. She shared that the fostering process is difficult because they can't move forward due to husband's job.  She shared that has been very hard for her to realize that it is just not the time. She was able to realize that it just isn't the right time for them. She went on to share that her in laws are questioning all of their decisions and that has been hurtful for patient. She shared she is having digestive issues and she is not sure if it is tied to coming off of meds or not.  She stated she is at a normal weight so she needs to monitor it at this point.  Patient reported at the end of session all the progress that she has made during treatment.  She was able to recognize that working really hard in the EMDR and  brain spotting has been very beneficial and she is interacting differently in situations than she has in the past.  Patient stated she has not been in a fetal position having panic attacks in a long time and prior to treatment she was doing that regularly.  Patient also shared that she is managing her husband not being home for long days and she being at home by herself much better and she is not having the irritability or triggered responses with him.  She also shared that with her brother she is able to communicate with him on the phone and realizing that if she keeps her anxiety in control he seems to do better which is good for their relationship as well.  Patient was encouraged to continue working on all the things that she is found to be helpful and to journal regularly her progress.  Discussed starting to put more distance between sessions since patient is making so much progress.  Interventions: Solution-Oriented/Positive Psychology and Insight-Oriented  Diagnosis:   ICD-10-CM   1. Posttraumatic stress disorder  F43.10       Plan: Patient is to use CBT and coping skills to decrease triggered responses.  Patient is to utilizing skills and journaling while her husband is gone for long periods of time.  Patient is to continue reminding  herself of all the progress that she has made in treatment.  Patient is to work on affirming herself regularly that she is safe and thinking through what to do to help her feel safe.  Patient is to start looking into the possibility of a service dog.  Patient is to work on Advertising copywriter on her mirror to repeat regularly.  Patient is to take things in small steps so that she is able to accomplish tasks.  Patient is to take medication as directed.   Patient is to work on focusing on the things that she can accomplish.  Patient is to work on Dr. Chrys Racer leaf on the neuro cycle detox.  Patient is to continue working with providers on medical issues. Long-term goal:  Interact normally with friends and family without irrational fears or intrusive thoughts that control behavior.  Display a full range of emotions without experiencing loss of control Short-term goal: Identify and replace negative self talk and catastrophic sizing that is associated with past trauma and current stimuli triggers for anxiety    Lina Sayre, [facility]

## 2022-04-19 ENCOUNTER — Ambulatory Visit: Payer: PRIVATE HEALTH INSURANCE | Admitting: Psychiatry

## 2022-04-19 ENCOUNTER — Encounter: Payer: Self-pay | Admitting: Psychiatry

## 2022-04-19 DIAGNOSIS — F3342 Major depressive disorder, recurrent, in full remission: Secondary | ICD-10-CM

## 2022-04-19 DIAGNOSIS — F4310 Post-traumatic stress disorder, unspecified: Secondary | ICD-10-CM

## 2022-04-19 DIAGNOSIS — F411 Generalized anxiety disorder: Secondary | ICD-10-CM | POA: Diagnosis not present

## 2022-04-19 MED ORDER — BUPROPION HCL ER (XL) 150 MG PO TB24
150.0000 mg | ORAL_TABLET | Freq: Every day | ORAL | 0 refills | Status: DC
Start: 2022-04-19 — End: 2022-07-19

## 2022-04-19 MED ORDER — CLONAZEPAM 1 MG PO TABS: 1.0000 mg | ORAL_TABLET | Freq: Every day | ORAL | 5 refills | Status: DC

## 2022-04-19 MED ORDER — BREXPIPRAZOLE 2 MG PO TABS: 2.0000 mg | ORAL_TABLET | Freq: Every day | ORAL | 0 refills | Status: DC

## 2022-04-19 NOTE — Progress Notes (Signed)
Peggy Ruiz [id number] 1986-04-12 36 y.o.  Subjective:   Patient ID:  Peggy Ruiz is a 36 y.o. (DOB [birth year]) female.  Chief Complaint:  Chief Complaint  Patient presents with   Follow-up    Depression and insomnia   Anxiety    HPI Peggy Ruiz presents to the office today for follow-up of depression, anxiety, and insomnia.   Her husband started work with [employer] as an Art gallery manager. Husband is commuting 1.5 hours each way. She reports that he usually gets home around 7:30 pm. She is now preparing dinner nightly through the week and reports that this is very taxing on her body.   She reports that her husband has been having difficulties with concentration and she is needing to frequently remind him to do things. She reports that this has caused her some frustration and that she is now needing to manage more. She reports feeling overwhelmed with keeping up with more things for the both of them. She reports that she has had a couple of anxiety attacks. She reports feeling the start of panic at times. She reports that she had severe anxiety  and catastrophic thoughts yesterday when suds started coming out the back of their washing machine. She reports increased worry. She reports that she has been reducing Klonopin as discussed and is has been taking Klonopin 0.5 mg morning and mid-day and 1 mg at bedtime for the last 3 weeks.   Denies depressed mood. She reports that she has been sleeping well. She reports that her appetite has been decreased. Reports not being hungry throughout the day. She reports recent GI side effects. Energy and motivation have been ok. Concentration has been adequate. Denies SI.   Father just had surgery for elbow and carpal tunnel. Mother is going to have another back surgery.   She and her husband are considering moving back to this area once mortgage rates improve.    Klonopin 0.5 mg last filled 04/02/22 x 3. Klonopin 1 mg last filled 03/22/22 x 5.  Past  Psychiatric Medication Trials: Cymbalta- Effective for a period of time and then no longer as effective.  Prozac Lexapro Paxil- Initially helped and then did not seem to be as effective when used without Zyprexa Effexor Nortriptyline-prescribed by GI for gastroparesis Remeron-caused insomnia Buspar- Adverse reaction Abilify-involuntary movements Zyprexa-effective and took for about 14 years.  Caused excessive weight gain at times. Seroquel-Ineffective. Felt like a "zombie."  Latuda-effective. Sleeping "harder" on 80 mg. Lamictal Gabapentin- Cannot tolerate doses higher than 800 mg BID Trileptal- Reports that she was unable to tolerate 900 mg QHS Belsomra-helpful with sleep quality but not sleep quantity. Klonopin Ativan Librium Clonidine- Effective Propranolol Trazodone Ambien Nuvigil Adderall Deplin  AIMS    Flowsheet Row Office Visit from 03/03/2022 in [facility] Office Visit from 12/21/2021 in [facility] Office Visit from 02/26/2021 in [facility] Office Visit from 12/22/2020 in [facility] Office Visit from 09/02/2020 in [facility]  AIMS Total Score 0 0 0 0 0        Review of Systems:  Review of Systems  Gastrointestinal:  Positive for abdominal distention, constipation and diarrhea.       She reports diarrhea alternating with constipation  Musculoskeletal:  Positive for back pain. Negative for gait problem.  Psychiatric/Behavioral:         Please refer to HPI    Medications: I have reviewed the patient's current medications.  Current Outpatient Medications  Medication Sig  Dispense Refill   clonazePAM (KLONOPIN) 0.5 MG tablet Take 1 tablet (0.5 mg total) by mouth 3 (three) times daily. Take 1 mg tab at bedtime (Patient taking differently: Take 0.5 mg by mouth 2 (two) times daily. Take 1 mg tab at bedtime) 90 tablet 4   Multiple Vitamin (MULTIVITAMIN) tablet Take 1 tablet by mouth  daily.     nortriptyline (PAMELOR) 10 MG capsule Take 1 capsule (10 mg total) by mouth at bedtime. 90 capsule 0   ondansetron (ZOFRAN-ODT) 8 MG disintegrating tablet Take 8 mg by mouth every 8 (eight) hours as needed for nausea.     paragard intrauterine copper IUD IUD 1 each by Intrauterine route once.     brexpiprazole (REXULTI) 2 MG TABS tablet Take 1 tablet (2 mg total) by mouth daily. 90 tablet 0   buPROPion (WELLBUTRIN XL) 150 MG 24 hr tablet Take 1 tablet (150 mg total) by mouth daily. 90 tablet 0   [START ON 05/16/2022] clonazePAM (KLONOPIN) 1 MG tablet Take 1 tablet (1 mg total) by mouth at bedtime. 30 tablet 5   dexlansoprazole (DEXILANT) 60 MG capsule Take 60 mg by mouth every morning.     famotidine (PEPCID) 20 MG tablet Take 20 mg by mouth 2 (two) times daily.      gabapentin (NEURONTIN) 800 MG tablet Take 800 mg by mouth 2 (two) times daily.     levothyroxine (SYNTHROID, LEVOTHROID) 50 MCG tablet Take 50 mcg by mouth daily.     Lifitegrast (XIIDRA) 5 % SOLN Apply to eye.     magnesium 30 MG tablet Take 30 mg by mouth 2 (two) times daily.     morphine (MSIR) 15 MG tablet morphine 15 mg immediate release tablet  Take 1 tablet 3 times a day by oral route as needed.     No current facility-administered medications for this visit.    Medication Side Effects: None  Allergies:  Allergies  Allergen Reactions   Promethazine     Other reaction(s): Agitation   Ciprofloxacin Other (See Comments)    c-diff   Dilaudid [Hydromorphone]     sob   Epinephrine    Penicillins Hives    Has patient had a PCN reaction causing immediate rash, facial/tongue/throat swelling, SOB or lightheadedness with hypotension: Yes Has patient had a PCN reaction causing severe rash involving mucus membranes or skin necrosis: No Has patient had a PCN reaction that required hospitalization: No Has patient had a PCN reaction occurring within the last 10 years: Yes If all of the above answers are "NO", then  may proceed with Cephalosporin use.    Toradol [Ketorolac Tromethamine] Nausea And Vomiting    Past Medical History:  Diagnosis Date   Anorexia    at 21   Anxiety    Attention deficit disorder (ADD)    Bone spur    Depression    Eczema    HPV (human papilloma virus) infection     Past Medical History, Surgical history, Social history, and Family history were reviewed and updated as appropriate.   Please see review of systems for further details on the patient's review from today.   Objective:   Physical Exam:  Wt 144 lb (65.3 kg)   BMI 21.27 kg/m   Physical Exam Constitutional:      General: She is not in acute distress. Musculoskeletal:        General: No deformity.  Neurological:     Mental Status: She is alert and oriented to person,  place, and time.     Coordination: Coordination normal.  Psychiatric:        Attention and Perception: Attention and perception normal. She does not perceive auditory or visual hallucinations.        Mood and Affect: Mood is anxious. Mood is not depressed. Affect is not labile, blunt, angry or inappropriate.        Speech: Speech normal.        Behavior: Behavior normal.        Thought Content: Thought content normal. Thought content is not paranoid or delusional. Thought content does not include homicidal or suicidal ideation. Thought content does not include homicidal or suicidal plan.        Cognition and Memory: Cognition and memory normal.        Judgment: Judgment normal.     Comments: Insight intact     Lab Review:     Component Value Date/Time   NA 139 06/26/2017 0025   NA 138 08/31/2011 1624   K 3.7 06/26/2017 0025   K 4.1 08/31/2011 1624   CL 105 06/26/2017 0025   CL 102 08/31/2011 1624   CO2 25 06/26/2017 0025   CO2 27 08/31/2011 1624   GLUCOSE 110 (H) 06/26/2017 0025   GLUCOSE 81 08/31/2011 1624   BUN 9 06/26/2017 0025   BUN 10 08/31/2011 1624   CREATININE 0.88 06/26/2017 0025   CREATININE 0.88 08/31/2011  1624   CALCIUM 9.1 06/26/2017 0025   CALCIUM 9.1 08/31/2011 1624   PROT 7.4 06/26/2017 0025   PROT 7.8 08/31/2011 1624   ALBUMIN 4.5 06/26/2017 0025   ALBUMIN 4.0 08/31/2011 1624   AST 19 06/26/2017 0025   AST 25 08/31/2011 1624   ALT 14 06/26/2017 0025   ALT 24 08/31/2011 1624   ALKPHOS 49 06/26/2017 0025   ALKPHOS 52 08/31/2011 1624   BILITOT 0.5 06/26/2017 0025   BILITOT 0.3 08/31/2011 1624   GFRNONAA >60 06/26/2017 0025   GFRNONAA >60 08/31/2011 1624   GFRAA >60 06/26/2017 0025   GFRAA >60 08/31/2011 1624       Component Value Date/Time   WBC 6.4 06/26/2017 0025   RBC 3.85 06/26/2017 0025   HGB 12.1 06/26/2017 0025   HGB 12.7 08/31/2011 1624   HCT 34.8 (L) 06/26/2017 0025   HCT 37.7 08/31/2011 1624   PLT 246 06/26/2017 0025   PLT 248 08/31/2011 1624   MCV 90.3 06/26/2017 0025   MCV 90 08/31/2011 1624   MCH 31.5 06/26/2017 0025   MCHC 34.9 06/26/2017 0025   RDW 11.9 06/26/2017 0025   RDW 12.6 08/31/2011 1624   LYMPHSABS 2.2 04/07/2017 1145   MONOABS 0.4 04/07/2017 1145   EOSABS 0.1 04/07/2017 1145   BASOSABS 0.0 04/07/2017 1145    No results found for: "POCLITH", "LITHIUM"   No results found for: "PHENYTOIN", "PHENOBARB", "VALPROATE", "CBMZ"   .res Assessment: Plan:    Pt seen for 30 minutes and time spent discussing response to decrease in Klonopin. She reports experiencing some recent worsening in anxiety symptoms and this may also be related to acute psychosocial stressors. She reports that she prefers to continue reduced dose of Klonopin at this time without resuming higher dose or making further dose reductions. Provider is in agreement with this plan. Continue Klonopin 0.5 mg twice daily and 1 mg at bedtime for anxiety.  Continue Rexulti 2 mg po qd for depression and anxiety.  Continue Wellbutrin XL 150 mg po qd for depression.  Pt to follow-up  in 3 months or sooner if clinically indicated.  Patient advised to contact office with any questions, adverse  effects, or acute worsening in signs and symptoms. Recommend continuing therapy with Stevphen Meuse, [facility].     Peggy Ruiz was seen today for follow-up and anxiety.  Diagnoses and all orders for this visit:  Recurrent major depressive disorder, in full remission (HCC) -     brexpiprazole (REXULTI) 2 MG TABS tablet; Take 1 tablet (2 mg total) by mouth daily. -     buPROPion (WELLBUTRIN XL) 150 MG 24 hr tablet; Take 1 tablet (150 mg total) by mouth daily.  Generalized anxiety disorder -     clonazePAM (KLONOPIN) 1 MG tablet; Take 1 tablet (1 mg total) by mouth at bedtime.  PTSD (post-traumatic stress disorder) -     clonazePAM (KLONOPIN) 1 MG tablet; Take 1 tablet (1 mg total) by mouth at bedtime.     Please see After Visit Summary for patient specific instructions.  Future Appointments  Date Time Provider Department Center  05/03/2022  2:00 PM Stevphen Meuse, [facility] CP-CP None  05/17/2022  2:00 PM Stevphen Meuse, [facility] CP-CP None  05/31/2022 12:00 PM Stevphen Meuse, [facility] CP-CP None  06/14/2022  2:00 PM Stevphen Meuse, [facility] CP-CP None  06/28/2022  2:00 PM Stevphen Meuse, [facility] CP-CP None  07/12/2022  2:00 PM Stevphen Meuse, [facility] CP-CP None  07/19/2022 11:30 AM Corie Chiquito, [facility]NP CP-CP None    No orders of the defined types were placed in this encounter.   -------------------------------

## 2022-04-19 NOTE — Progress Notes (Signed)
[facility] Counselor/Therapist Progress Note  Patient ID: Peggy Ruiz, MRN: [id number],    Date: 04/19/2022  Time Spent: 15 Minutes start time 2:03 PM end time 2:53 PM  Treatment Type: Individual Therapy  Reported Symptoms: anxiety, chronic pain issues, frustration, triggered responses, sadness, stomach issues  Mental Status Exam:  Appearance:   Well Groomed     Behavior:  Appropriate  Motor:  Normal  Speech/Language:   Normal Rate  Affect:  Appropriate  Mood:  anxious  Thought process:  normal  Thought content:    WNL  Sensory/Perceptual disturbances:    WNL  Orientation:  oriented to person, place, time/date, and situation  Attention:  Good  Concentration:  Good  Memory:  WNL  Fund of knowledge:   Good  Insight:    Good  Judgment:   Good  Impulse Control:  Good   Risk Assessment: Danger to Self:  No Self-injurious Behavior: No Danger to Others: No Duty to Warn:no Physical Aggression / Violence:No  Access to Firearms a concern: No  Gang Involvement:No   Subjective: Patient was present for session.  She shared that she had to go to her husband's appointment with him due to feeling he has adult ADD.  She shared she feels like she is always having to tell him what to do and he forgets things all the time.  She explained that her anxiety has been getting better and now it is going back up. She went on to share she had things breaking at the house and it was overwhelming for her. The stress seems to be impacting her stomach as well.  Patient was encouraged to realize that she has to focus on her husband's positive traits and not the issues he has.  Different ways to help her do that were discussed in session.  As patient was processing she was able to recognize she would rather have her husband with his issues than anybody else and she was going to work on focusing on those things.  Also discussed the importance of finding ways to simplify her situation so that she is  able to get done what she needs to do without having more issues physically.    Interventions: Cognitive Behavioral Therapy and Solution-Oriented/Positive Psychology  Diagnosis:   ICD-10-CM   1. Posttraumatic stress disorder  F43.10       Plan: Patient is to use CBT and coping skills to decrease triggered responses.  Patient is to focus on her husband's strengths rather than his deficits.  Patient is to continue reminding herself of all the progress that she has made in treatment.  Patient is to work on affirming herself regularly that she is safe and thinking through what to do to help her feel safe.  Patient is to start looking into the possibility of a service dog.  Patient is to work on Advertising copywriter on her mirror to repeat regularly.  Patient is to take things in small steps so that she is able to accomplish tasks.  Patient is to take medication as directed.   Patient is to work on focusing on the things that she can accomplish.  Patient is to work on Dr. Chrys Racer leaf on the neuro cycle detox.  Patient is to continue working with providers on medical issues. Long-term goal: Interact normally with friends and family without irrational fears or intrusive thoughts that control behavior.  Display a full range of emotions without experiencing loss of control Short-term goal: Identify and  replace negative self talk and catastrophic sizing that is associated with past trauma and current stimuli triggers for anxiety    Stevphen Meuse, [facility]

## 2022-04-28 DIAGNOSIS — R197 Diarrhea, unspecified: Secondary | ICD-10-CM | POA: Diagnosis not present

## 2022-04-28 DIAGNOSIS — K5904 Chronic idiopathic constipation: Secondary | ICD-10-CM | POA: Diagnosis not present

## 2022-04-28 DIAGNOSIS — K219 Gastro-esophageal reflux disease without esophagitis: Secondary | ICD-10-CM | POA: Diagnosis not present

## 2022-04-28 DIAGNOSIS — K30 Functional dyspepsia: Secondary | ICD-10-CM | POA: Diagnosis not present

## 2022-05-03 ENCOUNTER — Ambulatory Visit: Payer: PRIVATE HEALTH INSURANCE | Admitting: Psychiatry

## 2022-05-03 DIAGNOSIS — F4310 Post-traumatic stress disorder, unspecified: Secondary | ICD-10-CM | POA: Diagnosis not present

## 2022-05-03 NOTE — Progress Notes (Signed)
[facility] Counselor/Therapist Progress Note  Patient ID: Peggy Ruiz, MRN: [id number],    Date: 05/03/2022  Time Spent: 50 minutes start time 2:18 PM end time 3:08 PM  Treatment Type: Individual Therapy  Reported Symptoms: anxiety, triggered responses, sadness, irritability, fatigue, chronic pain issues  Mental Status Exam:  Appearance:   Well Groomed     Behavior:  Appropriate  Motor:  Normal  Speech/Language:   Normal Rate  Affect:  Appropriate  Mood:  irritable  Thought process:  normal  Thought content:    WNL  Sensory/Perceptual disturbances:    WNL  Orientation:  oriented to person, place, time/date, and situation  Attention:  Good  Concentration:  Good  Memory:  WNL  Fund of knowledge:   Good  Insight:    Good  Judgment:   Good  Impulse Control:  Good   Risk Assessment: Danger to Self:  No Self-injurious Behavior: No Danger to Others: No Duty to Warn:no Physical Aggression / Violence:No  Access to Firearms a concern: No  Gang Involvement:No   Subjective: Patient was present for session. She shared she is frustrated due to she is going to a family event over the weekend and she is struggling and doesn't want to go.  She shared that she also had a phone call with her in laws.  She was allowed time to process what she was feeling and think through what she needs to do to be able to manage the situation.  Patient was encouraged to recognize that things probably are not going to change with her and loss and that it would be important for her to figure out different CBT filters to use as she goes to upcoming family events.  Discussed the importance of her taking the time when she is there to spend it with the people she does want to be in contact with which she acknowledged was just his grandmother and grandfather.  Patient was also encouraged to find ways to support her husband even though that it is difficult.  Patient was able to think through some things that  she could say to her self to keep herself grounded and different strategies so that she can get through family events appropriately.  Interventions: Cognitive Behavioral Therapy and Solution-Oriented/Positive Psychology  Diagnosis:   ICD-10-CM   1. Posttraumatic stress disorder  F43.10       Plan: Patient is to use CBT and coping skills to decrease triggered responses.  Patient is to use CBT filters to help get through the family events that are coming up for her husband.  Patient is to continue reminding herself of all the progress that she has made in treatment.  Patient is to work on affirming herself regularly that she is safe and thinking through what to do to help her feel safe.  Patient is to start looking into the possibility of a service dog.  Patient is to work on Advertising copywriter on her mirror to repeat regularly.  Patient is to take things in small steps so that she is able to accomplish tasks.  Patient is to take medication as directed.   Patient is to work on focusing on the things that she can accomplish.  Patient is to work on Dr. Chrys Racer leaf on the neuro cycle detox.  Patient is to continue working with providers on medical issues. Long-term goal: Interact normally with friends and family without irrational fears or intrusive thoughts that control behavior.  Display a full  range of emotions without experiencing loss of control Short-term goal: Identify and replace negative self talk and catastrophic sizing that is associated with past trauma and current stimuli triggers for anxiety    Stevphen Meuse, [facility]

## 2022-05-05 DIAGNOSIS — N898 Other specified noninflammatory disorders of vagina: Secondary | ICD-10-CM | POA: Diagnosis not present

## 2022-05-05 DIAGNOSIS — R102 Pelvic and perineal pain: Secondary | ICD-10-CM | POA: Diagnosis not present

## 2022-05-12 DIAGNOSIS — R1031 Right lower quadrant pain: Secondary | ICD-10-CM | POA: Diagnosis not present

## 2022-05-12 DIAGNOSIS — R319 Hematuria, unspecified: Secondary | ICD-10-CM | POA: Diagnosis not present

## 2022-05-12 DIAGNOSIS — R102 Pelvic and perineal pain: Secondary | ICD-10-CM | POA: Diagnosis not present

## 2022-05-13 DIAGNOSIS — R319 Hematuria, unspecified: Secondary | ICD-10-CM | POA: Diagnosis not present

## 2022-05-17 ENCOUNTER — Ambulatory Visit (INDEPENDENT_AMBULATORY_CARE_PROVIDER_SITE_OTHER): Payer: PRIVATE HEALTH INSURANCE | Admitting: Psychiatry

## 2022-05-17 DIAGNOSIS — F4310 Post-traumatic stress disorder, unspecified: Secondary | ICD-10-CM | POA: Diagnosis not present

## 2022-05-17 NOTE — Progress Notes (Signed)
[facility] Counselor/Therapist Progress Note  Patient ID: Peggy Ruiz, MRN: [id number],    Date: 05/17/2022  Time Spent: 50 minutes start time 2:02 PM end time 2:52 PM  Treatment Type: Individual Therapy  Reported Symptoms: pain, anxiety, sadness, health issues, triggered responses, weight lose  Mental Status Exam:  Appearance:   Casual and Neat     Behavior:  Appropriate  Motor:  Normal  Speech/Language:   Normal Rate  Affect:  Appropriate  Mood:  anxious  Thought process:  normal  Thought content:    WNL  Sensory/Perceptual disturbances:    WNL  Orientation:  oriented to person, place, time/date, and situation  Attention:  Good  Concentration:  Good  Memory:  WNL  Fund of knowledge:   Good  Insight:    Good  Judgment:   Good  Impulse Control:  Good   Risk Assessment: Danger to Self:  No Self-injurious Behavior: No Danger to Others: No Duty to Warn:no Physical Aggression / Violence:No  Access to Firearms a concern: No  Gang Involvement:No   Subjective: Patient was present for session.  She shared right after session she had extreme pelvic pain and she went to the OBGYN  and it happened again and she went to the Urgent care it was all frustrating for her. She ended up having to follow up with OB and GI doctors and she is still in pain. She shared she feels that she is not heard or taken seriously. She made it through the family chicken stew by following plans from session. She shared she is working on her perspective with her in laws and that has helped her.  Discussed how to continue it into Thanksgiving.  Patient was also encouraged to think of things a little different during Thanksgiving she is to ask her relatives questions and she and her husband are to come up with a list that they can use to make it easy and feel natural.  Patient also followed through on the journaling exercise from last session and discussed it and what she realized through the process.   Patient was able to recognize that she does do what she needs to do for her husband but she is feeling bored and wants to have more in her life.  Discussed realistic expectations and ways for her to start finding things to do outside of the house even with her physical limitations.  Patient was encouraged to look at the progress she is made and to recognize all the different things that have changed in a positive way in her life already and to recognize that that journey can continue.  Interventions: Solution-Oriented/Positive Psychology and Insight-Oriented  Diagnosis:   ICD-10-CM   1. PTSD (post-traumatic stress disorder)  F43.10       Plan: Patient is to use CBT and coping skills to decrease triggered responses.  Patient is to use CBT filters to help get through the family events that are coming up for her husband.  Patient is to continue reminding herself of all the progress that she has made in treatment.  Patient is to work on affirming herself regularly that she is safe and thinking through what to do to help her feel safe.  Patient is to start looking into the possibility of a service dog.  Patient is to work on Psychologist, sport and exercise on her mirror to repeat regularly.  Patient is to take things in small steps so that she is able to accomplish tasks.  Patient is to take medication as directed.   Patient is to work on focusing on the things that she can accomplish.  Patient is to work on Dr. Rayfield Citizen leaf on the neuro cycle detox.  Patient is to continue working with providers on medical issues. Long-term goal: Interact normally with friends and family without irrational fears or intrusive thoughts that control behavior.  Display a full range of emotions without experiencing loss of control Short-term goal: Identify and replace negative self talk and catastrophic sizing that is associated with past trauma and current stimuli triggers for anxiety    Stevphen Meuse,  [facility]

## 2022-05-27 DIAGNOSIS — R102 Pelvic and perineal pain: Secondary | ICD-10-CM | POA: Diagnosis not present

## 2022-05-31 ENCOUNTER — Ambulatory Visit: Payer: PRIVATE HEALTH INSURANCE | Admitting: Psychiatry

## 2022-05-31 DIAGNOSIS — M961 Postlaminectomy syndrome, not elsewhere classified: Secondary | ICD-10-CM | POA: Diagnosis not present

## 2022-05-31 DIAGNOSIS — F4310 Post-traumatic stress disorder, unspecified: Secondary | ICD-10-CM | POA: Diagnosis not present

## 2022-05-31 DIAGNOSIS — M5416 Radiculopathy, lumbar region: Secondary | ICD-10-CM | POA: Diagnosis not present

## 2022-05-31 NOTE — Progress Notes (Signed)
[facility] Counselor/Therapist Progress Note  Patient ID: Peggy Ruiz, MRN: [id number],    Date: 05/31/2022  Time Spent: 55 minutes start time 12:00 PM end time 12:55 PM  Treatment Type: Individual Therapy  Reported Symptoms: pain issues, fatigue, medical issues, sadness, anxiety, triggering responses, panic attack, weight loss  Mental Status Exam:  Appearance:   Well Groomed     Behavior:  Appropriate  Motor:  Normal  Speech/Language:   Normal Rate  Affect:  Appropriate  Mood:  anxious and sad  Thought process:  normal  Thought content:    WNL  Sensory/Perceptual disturbances:    pain  Orientation:  oriented to person, place, time/date, and situation  Attention:  Good  Concentration:  Good  Memory:  WNL  Fund of knowledge:   Good  Insight:    Good  Judgment:   Good  Impulse Control:  Good   Risk Assessment: Danger to Self:  No Self-injurious Behavior: No Danger to Others: No Duty to Warn:no Physical Aggression / Violence:No  Access to Firearms a concern: No  Gang Involvement:No   Subjective: Patient was present for session. She shared she is in extreme pain and it is exhausting her.  She shared she is just wanting to sleep all day and she feels it is triggering her panic.  She stated her whole body is in pain.  She is also having anxiety due to knowing that when she has had surgeries in the past it triggers her stomach issues and she ends up losing more weight.  Patient also shared she is feeling overwhelmed by the money that they have had to spend on her with all her medical issues and knows that surgery will just cost more.  Did the EMDR set on the bills and the money spent, suds level 9, negative cognition "I am a burden" felt fear and anxiety and guilt in her chest and stomach.  Patient was able to reduce suds level to 4.  She was able to recognize that she needs to focus on what she does and how things have worked out in ways that she could not expect in the  past and they will work out with the surgery as well.  Interventions: Solution-Oriented/Positive Psychology, Eye Movement Desensitization and Reprocessing (EMDR), Insight-Oriented, and BS P  Diagnosis:   ICD-10-CM   1. PTSD (post-traumatic stress disorder)  F43.10       Plan:  Patient is to use CBT and coping skills to decrease triggered responses.   Patient is to continue reminding herself of all the progress that she has made in treatment as well as how things have worked out positively in the past.  Patient is to work on affirming herself regularly that she is safe and thinking through what to do to help her feel safe.  Patient is to start looking into the possibility of a service dog.  Patient is to work on Psychologist, sport and exercise on her mirror to repeat regularly.  Patient is to take things in small steps so that she is able to accomplish tasks.  Patient is to take medication as directed.   Patient is to work on focusing on the things that she can accomplish.  Patient is to work on Dr. Rayfield Citizen leaf on the neuro cycle detox.  Patient is to continue working with providers on medical issues. Long-term goal: Interact normally with friends and family without irrational fears or intrusive thoughts that control behavior.  Display a full range of  emotions without experiencing loss of control Short-term goal: Identify and replace negative self talk and catastrophic sizing that is associated with past trauma and current stimuli triggers for anxiety  Stevphen Meuse, [facility]

## 2022-06-14 ENCOUNTER — Ambulatory Visit: Payer: PRIVATE HEALTH INSURANCE | Admitting: Psychiatry

## 2022-06-14 DIAGNOSIS — F4310 Post-traumatic stress disorder, unspecified: Secondary | ICD-10-CM | POA: Diagnosis not present

## 2022-06-14 NOTE — Progress Notes (Signed)
[facility] Counselor/Therapist Progress Note  Patient ID: Peggy Ruiz, MRN: [id number],    Date: 06/14/2022  Time Spent: 50 minutes start time 2:07 PM end time 2:57 PM  Treatment Type: Individual Therapy  Reported Symptoms: pain issues, anxiety, sadness, panic, triggered responses, intrusive thoughts  Mental Status Exam:  Appearance:   Well Groomed     Behavior:  Appropriate  Motor:  Normal  Speech/Language:   Normal Rate  Affect:  Appropriate  Mood:  anxious and sad  Thought process:  normal  Thought content:    WNL  Sensory/Perceptual disturbances:    pain  Orientation:  oriented to person, place, time/date, and situation  Attention:  Good  Concentration:  Good  Memory:  WNL  Fund of knowledge:   Good  Insight:    Good  Judgment:   Good  Impulse Control:  Good   Risk Assessment: Danger to Self:  No Self-injurious Behavior: No Danger to Others: No Duty to Warn:no Physical Aggression / Violence:No  Access to Firearms a concern: No  Gang Involvement:No   Subjective: Patient was present for session.  She shared that she had a horrible weekend due to her extreme pain and it was very depressing for her.  She called her doctor and she shared that she will be having surgery on 07/20/22 and she is hopeful it will be better after than. Her mother is having surgery on her back on 06/17/22 and that will be impacting her Christmas.  Patient was able to share that all that is going on is overwhelming for her and it has made it difficult for her to feel good about anything.  She shared she has even had some intrusive thoughts of self-harm and she knew that has not what is normal for her.  Patient was allowed time to process through all that is going on discussed the CBT cycle and the importance of focusing on what she is thinking about.  She was able to recognize that even though there is been a lot of hard stuff there is been a lot of good stuff and that even when she has been  through difficult sides she has been able to get to the other side with God's help.  Patient is going to start writing down all the ways that she sees positive things happening in God working in her life to review when she starts having the intrusive thoughts.  Interventions: Cognitive Behavioral Therapy and Solution-Oriented/Positive Psychology  Diagnosis:   ICD-10-CM   1. PTSD (post-traumatic stress disorder)  F43.10       Plan: Patient is to use CBT and coping skills to decrease triggered responses.   Patient is to write down things that have worked out in ways that she could not understand and read the list when she starts feeling hopeless.  Patient is to work on affirming herself regularly that she is safe and thinking through what to do to help her feel safe.  Patient is to start looking into the possibility of a service dog.  Patient is to work on Psychologist, sport and exercise on her mirror to repeat regularly.  Patient is to take things in small steps so that she is able to accomplish tasks.  Patient is to take medication as directed.   Patient is to work on focusing on the things that she can accomplish.  Patient is to work on Dr. Rayfield Citizen leaf on the neuro cycle detox.  Patient is to continue working with providers  on medical issues. Long-term goal: Interact normally with friends and family without irrational fears or intrusive thoughts that control behavior.  Display a full range of emotions without experiencing loss of control Short-term goal: Identify and replace negative self talk and catastrophic sizing that is associated with past trauma and current stimuli triggers for anxiety  Stevphen Meuse, [facility]

## 2022-06-28 ENCOUNTER — Ambulatory Visit: Payer: PRIVATE HEALTH INSURANCE | Admitting: Psychiatry

## 2022-06-28 DIAGNOSIS — F4310 Post-traumatic stress disorder, unspecified: Secondary | ICD-10-CM

## 2022-06-28 NOTE — Progress Notes (Signed)
[facility] Counselor/Therapist Progress Note  Patient ID: Peggy Ruiz, MRN: [id number],    Date: 06/28/2022  Time Spent: 60 minutes start time 2:01 PM  Treatment Type: Individual Therapy  Reported Symptoms: weight loss, anxiety, triggered responses, rumination, health issues, fatigue, flashbacks  Mental Status Exam:  Appearance:   Well Groomed     Behavior:  Appropriate  Motor:  Normal  Speech/Language:   Normal Rate  Affect:  Appropriate  Mood:  anxious  Thought process:  normal  Thought content:    WNL  Sensory/Perceptual disturbances:    WNL  Orientation:  oriented to person, place, time/date, and situation  Attention:  Good  Concentration:  Good  Memory:  WNL  Fund of knowledge:   Good  Insight:    Good  Judgment:   Good  Impulse Control:  Good   Risk Assessment: Danger to Self:  No Self-injurious Behavior: No Danger to Others: No Duty to Warn:no Physical Aggression / Violence:No  Access to Firearms a concern: No  Gang Involvement:No   Subjective: Patient was present for session. She shared she is still losing weight but her doctors do not seem to be concerned by patient report.  She has found a house that she is excited about and will be moving into in April. Her husband feels good about his job. Her mother got through her surgery well. She spent a week with her parents to help and that was a good thing. She was able to recognize she is starting to have hope that things are going better.  She is getting ready for her upcoming surgery. She shared she has been able to have intimacy with her husband twice over the past few weeks which is progress. And they are working on their communication.  Patient did processing set on looking in the mirror, suds level 10, negative cognition "I am not safe" felt anxiety and fear in her stomach and legs.  Patient was able to reduce suds level to 5.  She was able to recognize that she is at a much better place and is doing better  than she was when she was in high school.  Patient was encouraged to affirm herself regularly of the facts/truth.  Interventions: Solution-Oriented/Positive Psychology, Eye Movement Desensitization and Reprocessing (EMDR), Insight-Oriented, and BSP  Diagnosis:   ICD-10-CM   1. PTSD (post-traumatic stress disorder)  F43.10       Plan:  Patient is to use CBT and coping skills to decrease triggered responses.   Patient is to write down things that have worked out in ways that she could not understand and read the list when she starts feeling hopeless.  Patient is to work on affirming herself regularly that she is safe and thinking through what to do to help her feel safe.  Patient is to start looking into the possibility of a service dog.  Patient is to work on Advertising copywriter on her mirror to repeat regularly.  Patient is to take things in small steps so that she is able to accomplish tasks.  Patient is to take medication as directed.   Patient is to work on focusing on the things that she can accomplish.  Patient is to work on Dr. Chrys Racer leaf on the neuro cycle detox.  Patient is to continue working with providers on medical issues. Long-term goal: Interact normally with friends and family without irrational fears or intrusive thoughts that control behavior.  Display a full range of emotions without  experiencing loss of control Short-term goal: Identify and replace negative self talk and catastrophic sizing that is associated with past trauma and current stimuli triggers for anxiety  Lina Sayre, [facility]

## 2022-07-12 ENCOUNTER — Ambulatory Visit: Payer: PRIVATE HEALTH INSURANCE | Admitting: Psychiatry

## 2022-07-12 DIAGNOSIS — F4310 Post-traumatic stress disorder, unspecified: Secondary | ICD-10-CM | POA: Diagnosis not present

## 2022-07-12 NOTE — Progress Notes (Signed)
[facility] Counselor/Therapist Progress Note  Patient ID: Peggy Ruiz, MRN: [id number],    Date: 07/12/2022  Time Spent: 57 minutes start time 2:03 PM end time 3 PM  Treatment Type: Individual Therapy  Reported Symptoms: anxiety, panic, rumination, triggered responses, sadness, flashbacks, weight loss, eating issues  Mental Status Exam:  Appearance:   Well Groomed     Behavior:  Appropriate  Motor:  Normal  Speech/Language:   Normal Rate  Affect:  Appropriate  Mood:  anxious  Thought process:  normal  Thought content:    WNL  Sensory/Perceptual disturbances:    WNL  Orientation:  oriented to person, place, time/date, and situation  Attention:  Good  Concentration:  Good  Memory:  WNL  Fund of knowledge:   Good  Insight:    Good  Judgment:   Good  Impulse Control:  Good   Risk Assessment: Danger to Self:  No Self-injurious Behavior: No Danger to Others: No Duty to Warn:no Physical Aggression / Violence:No  Access to Firearms a concern: No  Gang Involvement:No   Subjective: Patient was present for session.  She shared that her anxiety has been really high.  She stated she is thinking about her up coming surgery and recovery.  She is also struggling due EMS having to take her mother to the hospital due to chest pain and it came up that she had COVID again and that was hard due to her being very sick last time she had it and she is 2 weeks post op. Her husband is working 50 + hours a week and commuting an hour and a half each way. She went on to share she found out that her dad may have to have knee surgery. She is losing weight due to not being able to eat even though she is trying.  She is fearful her digestive system will shut down again after surgery. 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  Patient was given time to share what she was feeling and how the upcoming surgery anxiety was impacting  her.  Encouraged patient to try grounding exercises to get calmer.  Patient explained that she is not able to eat anything due to not having appetite and when she tries to eat she gets nauseated.  She shared that she is recognizing she is losing more weight and getting down to the number where she had to go into a gastric unit.  She expressed concern that that might happen with the surgery.  Patient was able to recognize through processing that her anxiety is keeping her stomach acid high which is making it difficult for her to eat anything and she is staying nauseated.  After processing she was able to recognize that when she was going through this.  In the past she was able to go to [employer] and have their banana strawberry smoothies and they stayed down and they put calories on her.  She was also able to remember that people told her just to eat anything not to worry about what it is or the caloric value but to recognize that she just needed to get something into her body.  Patient was challenged on what was stopping her from doing what she had to do in the past to get herself healthy.  She finally agreed that after session she would go to [employer] and get the strawberry banana smoothie that  was helpful for her in the past and that she would start just trying to eat anything that her body can agree with.  Patient was also encouraged to try and focus on the things that she can do and think she can control rather than the things that she cannot control.  Discussed different strategies to help her do that.  Also reminded her of CBT skills that she has that she could utilize more effectively than she is currently.  Patient appeared visibly calmer at the end of session and agreed feeling better and that she had some form of plan.  Patient was also encouraged to write out all her fears concerns things that have happened in previous surgeries so that she could handed to the doctor and anesthesiologist when she goes  next week for the preop visit.  Interventions: Cognitive Behavioral Therapy, Solution-Oriented/Positive Psychology, and Insight-Oriented  Diagnosis:   ICD-10-CM   1. PTSD (post-traumatic stress disorder)  F43.10       Plan: Patient is to use CBT and coping skills to decrease triggered responses.   Patient is to write down things that happened before and after her last surgeries that were concerning for her and give that information to the physician and anesthesiologist at her preop visit.  Patient is to follow plans to start focusing on things that she can get in her system and what she can do rather than ruminating on the negative..  Patient is to work on affirming herself regularly that she is safe and thinking through what to do to help her feel safe.  Patient is to start looking into the possibility of a service dog.  Patient is to work on Advertising copywriter on her mirror to repeat regularly.  Patient is to take things in small steps so that she is able to accomplish tasks.  Patient is to take medication as directed.   Patient is to work on focusing on the things that she can accomplish.  Patient is to work on Dr. Chrys Racer leaf on the neuro cycle detox.  Patient is to continue working with providers on medical issues. Long-term goal: Interact normally with friends and family without irrational fears or intrusive thoughts that control behavior.  Display a full range of emotions without experiencing loss of control Short-term goal: Identify and replace negative self talk and catastrophic sizing that is associated with past trauma and current stimuli triggers for anxiety  Lina Sayre, [facility]

## 2022-07-18 DIAGNOSIS — Z79899 Other long term (current) drug therapy: Secondary | ICD-10-CM | POA: Diagnosis not present

## 2022-07-18 DIAGNOSIS — R102 Pelvic and perineal pain: Secondary | ICD-10-CM | POA: Diagnosis not present

## 2022-07-18 DIAGNOSIS — F419 Anxiety disorder, unspecified: Secondary | ICD-10-CM | POA: Diagnosis not present

## 2022-07-18 DIAGNOSIS — Z833 Family history of diabetes mellitus: Secondary | ICD-10-CM | POA: Diagnosis not present

## 2022-07-18 DIAGNOSIS — N920 Excessive and frequent menstruation with regular cycle: Secondary | ICD-10-CM | POA: Diagnosis not present

## 2022-07-18 DIAGNOSIS — Z30432 Encounter for removal of intrauterine contraceptive device: Secondary | ICD-10-CM | POA: Diagnosis not present

## 2022-07-18 DIAGNOSIS — N80329 Endometriosis of the posterior cul-de-sac, unspecified depth: Secondary | ICD-10-CM | POA: Diagnosis not present

## 2022-07-18 DIAGNOSIS — Z8349 Family history of other endocrine, nutritional and metabolic diseases: Secondary | ICD-10-CM | POA: Diagnosis not present

## 2022-07-18 DIAGNOSIS — Z79890 Hormone replacement therapy (postmenopausal): Secondary | ICD-10-CM | POA: Diagnosis not present

## 2022-07-18 DIAGNOSIS — Z9151 Personal history of suicidal behavior: Secondary | ICD-10-CM | POA: Diagnosis not present

## 2022-07-18 DIAGNOSIS — Z9049 Acquired absence of other specified parts of digestive tract: Secondary | ICD-10-CM | POA: Diagnosis not present

## 2022-07-18 DIAGNOSIS — G8929 Other chronic pain: Secondary | ICD-10-CM | POA: Diagnosis not present

## 2022-07-18 DIAGNOSIS — R630 Anorexia: Secondary | ICD-10-CM | POA: Diagnosis not present

## 2022-07-18 DIAGNOSIS — Z886 Allergy status to analgesic agent status: Secondary | ICD-10-CM | POA: Diagnosis not present

## 2022-07-18 DIAGNOSIS — Z881 Allergy status to other antibiotic agents status: Secondary | ICD-10-CM | POA: Diagnosis not present

## 2022-07-18 DIAGNOSIS — Z6821 Body mass index (BMI) 21.0-21.9, adult: Secondary | ICD-10-CM | POA: Diagnosis not present

## 2022-07-18 DIAGNOSIS — K219 Gastro-esophageal reflux disease without esophagitis: Secondary | ICD-10-CM | POA: Diagnosis not present

## 2022-07-18 DIAGNOSIS — Z880 Allergy status to penicillin: Secondary | ICD-10-CM | POA: Diagnosis not present

## 2022-07-18 DIAGNOSIS — D649 Anemia, unspecified: Secondary | ICD-10-CM | POA: Diagnosis not present

## 2022-07-18 DIAGNOSIS — Z888 Allergy status to other drugs, medicaments and biological substances status: Secondary | ICD-10-CM | POA: Diagnosis not present

## 2022-07-18 DIAGNOSIS — F32A Depression, unspecified: Secondary | ICD-10-CM | POA: Diagnosis not present

## 2022-07-18 DIAGNOSIS — E039 Hypothyroidism, unspecified: Secondary | ICD-10-CM | POA: Diagnosis not present

## 2022-07-19 ENCOUNTER — Ambulatory Visit: Payer: PRIVATE HEALTH INSURANCE | Admitting: Psychiatry

## 2022-07-19 ENCOUNTER — Encounter: Payer: Self-pay | Admitting: Psychiatry

## 2022-07-19 DIAGNOSIS — F411 Generalized anxiety disorder: Secondary | ICD-10-CM | POA: Diagnosis not present

## 2022-07-19 DIAGNOSIS — F4310 Post-traumatic stress disorder, unspecified: Secondary | ICD-10-CM | POA: Diagnosis not present

## 2022-07-19 DIAGNOSIS — F3342 Major depressive disorder, recurrent, in full remission: Secondary | ICD-10-CM

## 2022-07-19 MED ORDER — BUPROPION HCL ER (XL) 150 MG PO TB24: 150.0000 mg | ORAL_TABLET | Freq: Every day | ORAL | 1 refills | Status: DC

## 2022-07-19 MED ORDER — BREXPIPRAZOLE 2 MG PO TABS: 2.0000 mg | ORAL_TABLET | Freq: Every day | ORAL | 1 refills | Status: DC

## 2022-07-19 MED ORDER — CLONAZEPAM 0.5 MG PO TABS: 0.5000 mg | ORAL_TABLET | Freq: Two times a day (BID) | ORAL | 3 refills | Status: DC

## 2022-07-19 NOTE — Progress Notes (Signed)
Peggy Ruiz [id number] Oct 15, 1985 37 y.o.  Subjective:   Patient ID:  Peggy Ruiz is a 36 y.o. (DOB [birth year]) female.  Chief Complaint:  Chief Complaint  Patient presents with   Anxiety    HPI Peggy Ruiz presents to the office today for follow-up of depression, anxiety, and insomnia.   She reports that she has been dealing with some medical issues. She reports that she has had GI s/s, decreased appetite, and weight loss. Surgery scheduled for tomorrow to evaluate for endometriosis. She reports that general anesthesia has caused gastroparesis flares in the past and is anxious about this. She reports anxiety in response to multiple stressors. She reports worry and anxious thoughts. Denies panic attacks. She reports periods of acute anxiety. She reports, "I'm never relaxed." Denies depressed mood- "I'm more anxious than depressed. I'm more overwhelmed because of what is going on. I'm sad that" she may be diagnosed with a new condition. She reports crying daily due to pain.  Energy has been low. She reports that the "motivation is there" and wants to do things, but physically is not able to and this leads to frustration. Sleeping well. Concentration has been ok. She reports self-injurious ideation without behavior. Denies SI.   Mother recently had her 4th back surgery. Father will need knee surgery. Husband is getting tested for ADHD next week. Husband is commuting 91 miles each way. They are moving and building a house in [place]. May be moving in April. They are getting their house ready to sell.   She is now taking Klonopin 0.5 mg twice daily. Taking Klonopin 1 mg QHS.  Klonopin 0.5 mg last filled 06/19/22. Klonopin 1 mg last filled 06/19/22 x 2.  Past Psychiatric Medication Trials: Cymbalta- Effective for a period of time and then no longer as effective.  Prozac Lexapro Paxil- Initially helped and then did not seem to be as effective when used without  Zyprexa Effexor Nortriptyline-prescribed by GI for gastroparesis Remeron-caused insomnia Buspar- Adverse reaction Abilify-involuntary movements Zyprexa-effective and took for about 14 years.  Caused excessive weight gain at times. Seroquel-Ineffective. Felt like a "zombie."  Latuda-effective. Sleeping "harder" on 80 mg. Lamictal Gabapentin- Cannot tolerate doses higher than 800 mg BID Trileptal- Reports that she was unable to tolerate 900 mg QHS Belsomra-helpful with sleep quality but not sleep quantity. Klonopin Ativan Librium Clonidine- Effective Propranolol Trazodone Ambien Nuvigil Adderall Deplin    AIMS    Flowsheet Row Office Visit from 03/03/2022 in [place] Office Visit from 12/21/2021 in [place] Office Visit from 02/26/2021 in [facility] Office Visit from 12/22/2020 in [place] Office Visit from 09/02/2020 in [facility]  AIMS Total Score 0 0 0 0 0        Review of Systems:  Review of Systems  Constitutional:  Positive for fatigue.  Gastrointestinal:  Positive for nausea.  Genitourinary:  Positive for menstrual problem.  Musculoskeletal:  Positive for back pain. Negative for gait problem.  Neurological:  Negative for tremors.  Psychiatric/Behavioral:         Please refer to HPI    Medications: I have reviewed the patient's current medications.  Current Outpatient Medications  Medication Sig Dispense Refill   dexlansoprazole (DEXILANT) 60 MG capsule Take 60 mg by mouth every morning.     gabapentin (NEURONTIN) 800 MG tablet Take 800 mg by mouth 2 (two) times daily.     levothyroxine (  SYNTHROID, LEVOTHROID) 50 MCG tablet Take 50 mcg by mouth daily.     Lifitegrast (XIIDRA) 5 % SOLN Apply to eye.     magnesium 30 MG tablet Take 30 mg by mouth 2 (two) times daily.     Multiple Vitamin (MULTIVITAMIN) tablet Take 1  tablet by mouth daily.     nortriptyline (PAMELOR) 10 MG capsule Take 1 capsule (10 mg total) by mouth at bedtime. 90 capsule 0   NUCYNTA 50 MG tablet Take 50 mg by mouth 3 (three) times daily as needed.     ondansetron (ZOFRAN-ODT) 8 MG disintegrating tablet Take 8 mg by mouth every 8 (eight) hours as needed for nausea.     paragard intrauterine copper IUD IUD 1 each by Intrauterine route once.     brexpiprazole (REXULTI) 2 MG TABS tablet Take 1 tablet (2 mg total) by mouth daily. 90 tablet 1   buPROPion (WELLBUTRIN XL) 150 MG 24 hr tablet Take 1 tablet (150 mg total) by mouth daily. 90 tablet 1   clonazePAM (KLONOPIN) 0.5 MG tablet Take 1 tablet (0.5 mg total) by mouth 2 (two) times daily. Take 1 mg tab at bedtime 60 tablet 3   clonazePAM (KLONOPIN) 1 MG tablet Take 1 tablet (1 mg total) by mouth at bedtime. 30 tablet 5   famotidine (PEPCID) 20 MG tablet Take 20 mg by mouth 2 (two) times daily.  (Patient not taking: Reported on 07/19/2022)     morphine (MSIR) 15 MG tablet morphine 15 mg immediate release tablet  Take 1 tablet 3 times a day by oral route as needed. (Patient not taking: Reported on 07/19/2022)     No current facility-administered medications for this visit.    Medication Side Effects: None  Allergies:  Allergies  Allergen Reactions   Promethazine     Other reaction(s): Agitation   Ciprofloxacin Other (See Comments)    c-diff   Dilaudid [Hydromorphone]     sob   Epinephrine    Penicillins Hives    Has patient had a PCN reaction causing immediate rash, facial/tongue/throat swelling, SOB or lightheadedness with hypotension: Yes Has patient had a PCN reaction causing severe rash involving mucus membranes or skin necrosis: No Has patient had a PCN reaction that required hospitalization: No Has patient had a PCN reaction occurring within the last 10 years: Yes If all of the above answers are "NO", then may proceed with Cephalosporin use.    Toradol [Ketorolac Tromethamine]  Nausea And Vomiting    Past Medical History:  Diagnosis Date   Anorexia    at 25   Anxiety    Attention deficit disorder (ADD)    Bone spur    Depression    Eczema    HPV (human papilloma virus) infection     Past Medical History, Surgical history, Social history, and Family history were reviewed and updated as appropriate.   Please see review of systems for further details on the patient's review from today.   Objective:   Physical Exam:  Wt 140 lb (63.5 kg)   BMI 20.67 kg/m   Physical Exam Constitutional:      General: She is not in acute distress. Musculoskeletal:        General: No deformity.  Neurological:     Mental Status: She is alert and oriented to person, place, and time.     Coordination: Coordination normal.  Psychiatric:        Attention and Perception: Attention and perception normal. She does not  perceive auditory or visual hallucinations.        Mood and Affect: Mood is anxious. Affect is not labile, blunt, angry or inappropriate.        Speech: Speech normal.        Behavior: Behavior normal.        Thought Content: Thought content normal. Thought content is not paranoid or delusional. Thought content does not include homicidal or suicidal ideation. Thought content does not include homicidal or suicidal plan.        Cognition and Memory: Cognition and memory normal.        Judgment: Judgment normal.     Comments: Insight intact Sad mood in response to recent stressors     Lab Review:     Component Value Date/Time   NA 139 06/26/2017 0025   NA 138 08/31/2011 1624   K 3.7 06/26/2017 0025   K 4.1 08/31/2011 1624   CL 105 06/26/2017 0025   CL 102 08/31/2011 1624   CO2 25 06/26/2017 0025   CO2 27 08/31/2011 1624   GLUCOSE 110 (H) 06/26/2017 0025   GLUCOSE 81 08/31/2011 1624   BUN 9 06/26/2017 0025   BUN 10 08/31/2011 1624   CREATININE 0.88 06/26/2017 0025   CREATININE 0.88 08/31/2011 1624   CALCIUM 9.1 06/26/2017 0025   CALCIUM 9.1  08/31/2011 1624   PROT 7.4 06/26/2017 0025   PROT 7.8 08/31/2011 1624   ALBUMIN 4.5 06/26/2017 0025   ALBUMIN 4.0 08/31/2011 1624   AST 19 06/26/2017 0025   AST 25 08/31/2011 1624   ALT 14 06/26/2017 0025   ALT 24 08/31/2011 1624   ALKPHOS 49 06/26/2017 0025   ALKPHOS 52 08/31/2011 1624   BILITOT 0.5 06/26/2017 0025   BILITOT 0.3 08/31/2011 1624   GFRNONAA >60 06/26/2017 0025   GFRNONAA >60 08/31/2011 1624   GFRAA >60 06/26/2017 0025   GFRAA >60 08/31/2011 1624       Component Value Date/Time   WBC 6.4 06/26/2017 0025   RBC 3.85 06/26/2017 0025   HGB 12.1 06/26/2017 0025   HGB 12.7 08/31/2011 1624   HCT 34.8 (L) 06/26/2017 0025   HCT 37.7 08/31/2011 1624   PLT 246 06/26/2017 0025   PLT 248 08/31/2011 1624   MCV 90.3 06/26/2017 0025   MCV 90 08/31/2011 1624   MCH 31.5 06/26/2017 0025   MCHC 34.9 06/26/2017 0025   RDW 11.9 06/26/2017 0025   RDW 12.6 08/31/2011 1624   LYMPHSABS 2.2 04/07/2017 1145   MONOABS 0.4 04/07/2017 1145   EOSABS 0.1 04/07/2017 1145   BASOSABS 0.0 04/07/2017 1145    No results found for: "POCLITH", "LITHIUM"   No results found for: "PHENYTOIN", "PHENOBARB", "VALPROATE", "CBMZ"   .res Assessment: Plan:   Will continue current plan of care without changes at this time since pt reports that current anxiety seems to be situational and related to acute pain.  Will continue Rexulti 2 mg po qd for depression.  Continue Wellbutrin XL 150 mg daily for depression.  Will change klonopin 0.5 mg to twice daily since she has been taking BID instead of TID due to drowsiness. Continue Klonopin 1 mg po QHS for anxiety and insomnia.  Recommend continuing therapy with Lina Sayre, [facility].  Pt to follow-up in 2 months or sooner if clinically indicated.  Patient advised to contact office with any questions, adverse effects, or acute worsening in signs and symptoms.  Rozelia was seen today for anxiety.  Diagnoses and all orders for this  visit:  Generalized  anxiety disorder -     clonazePAM (KLONOPIN) 0.5 MG tablet; Take 1 tablet (0.5 mg total) by mouth 2 (two) times daily. Take 1 mg tab at bedtime  PTSD (post-traumatic stress disorder) -     clonazePAM (KLONOPIN) 0.5 MG tablet; Take 1 tablet (0.5 mg total) by mouth 2 (two) times daily. Take 1 mg tab at bedtime  Recurrent major depressive disorder, in full remission (HCC) -     buPROPion (WELLBUTRIN XL) 150 MG 24 hr tablet; Take 1 tablet (150 mg total) by mouth daily. -     brexpiprazole (REXULTI) 2 MG TABS tablet; Take 1 tablet (2 mg total) by mouth daily.     Please see After Visit Summary for patient specific instructions.  Future Appointments  Date Time Provider Department Center  07/26/2022  2:00 PM Stevphen Meuse, [facility] CP-CP None  08/09/2022  2:00 PM Stevphen Meuse, [facility] CP-CP None  08/23/2022  2:00 PM Stevphen Meuse, [facility] CP-CP None  09/06/2022  1:00 PM Stevphen Meuse, [facility] CP-CP None  09/20/2022  2:00 PM Stevphen Meuse, [facility] CP-CP None  10/04/2022  2:00 PM Stevphen Meuse, [facility] CP-CP None    No orders of the defined types were placed in this encounter.   -------------------------------

## 2022-07-20 DIAGNOSIS — Z79890 Hormone replacement therapy (postmenopausal): Secondary | ICD-10-CM | POA: Diagnosis not present

## 2022-07-20 DIAGNOSIS — Z30432 Encounter for removal of intrauterine contraceptive device: Secondary | ICD-10-CM | POA: Diagnosis not present

## 2022-07-20 DIAGNOSIS — Z880 Allergy status to penicillin: Secondary | ICD-10-CM | POA: Diagnosis not present

## 2022-07-20 DIAGNOSIS — K219 Gastro-esophageal reflux disease without esophagitis: Secondary | ICD-10-CM | POA: Diagnosis not present

## 2022-07-20 DIAGNOSIS — R102 Pelvic and perineal pain: Secondary | ICD-10-CM | POA: Diagnosis not present

## 2022-07-20 DIAGNOSIS — D649 Anemia, unspecified: Secondary | ICD-10-CM | POA: Diagnosis not present

## 2022-07-20 DIAGNOSIS — Z6821 Body mass index (BMI) 21.0-21.9, adult: Secondary | ICD-10-CM | POA: Diagnosis not present

## 2022-07-20 DIAGNOSIS — Z8349 Family history of other endocrine, nutritional and metabolic diseases: Secondary | ICD-10-CM | POA: Diagnosis not present

## 2022-07-20 DIAGNOSIS — N920 Excessive and frequent menstruation with regular cycle: Secondary | ICD-10-CM | POA: Diagnosis not present

## 2022-07-20 DIAGNOSIS — G8929 Other chronic pain: Secondary | ICD-10-CM | POA: Diagnosis not present

## 2022-07-20 DIAGNOSIS — Z888 Allergy status to other drugs, medicaments and biological substances status: Secondary | ICD-10-CM | POA: Diagnosis not present

## 2022-07-20 DIAGNOSIS — Z79899 Other long term (current) drug therapy: Secondary | ICD-10-CM | POA: Diagnosis not present

## 2022-07-20 DIAGNOSIS — Z9049 Acquired absence of other specified parts of digestive tract: Secondary | ICD-10-CM | POA: Diagnosis not present

## 2022-07-20 DIAGNOSIS — N80329 Endometriosis of the posterior cul-de-sac, unspecified depth: Secondary | ICD-10-CM | POA: Diagnosis not present

## 2022-07-20 DIAGNOSIS — Z9151 Personal history of suicidal behavior: Secondary | ICD-10-CM | POA: Diagnosis not present

## 2022-07-20 DIAGNOSIS — F419 Anxiety disorder, unspecified: Secondary | ICD-10-CM | POA: Diagnosis not present

## 2022-07-20 DIAGNOSIS — F32A Depression, unspecified: Secondary | ICD-10-CM | POA: Diagnosis not present

## 2022-07-20 DIAGNOSIS — Z833 Family history of diabetes mellitus: Secondary | ICD-10-CM | POA: Diagnosis not present

## 2022-07-20 DIAGNOSIS — N803C2 Endometriosis of the left uterosacral ligament, unspecified depth: Secondary | ICD-10-CM | POA: Diagnosis not present

## 2022-07-20 DIAGNOSIS — R630 Anorexia: Secondary | ICD-10-CM | POA: Diagnosis not present

## 2022-07-20 DIAGNOSIS — E039 Hypothyroidism, unspecified: Secondary | ICD-10-CM | POA: Diagnosis not present

## 2022-07-20 DIAGNOSIS — Z881 Allergy status to other antibiotic agents status: Secondary | ICD-10-CM | POA: Diagnosis not present

## 2022-07-20 DIAGNOSIS — Z886 Allergy status to analgesic agent status: Secondary | ICD-10-CM | POA: Diagnosis not present

## 2022-07-22 DIAGNOSIS — N803 Endometriosis of pelvic peritoneum, unspecified: Secondary | ICD-10-CM | POA: Diagnosis not present

## 2022-07-24 DIAGNOSIS — Z9049 Acquired absence of other specified parts of digestive tract: Secondary | ICD-10-CM | POA: Diagnosis not present

## 2022-07-24 DIAGNOSIS — N3289 Other specified disorders of bladder: Secondary | ICD-10-CM | POA: Diagnosis not present

## 2022-07-24 DIAGNOSIS — Z79899 Other long term (current) drug therapy: Secondary | ICD-10-CM | POA: Diagnosis not present

## 2022-07-24 DIAGNOSIS — L7682 Other postprocedural complications of skin and subcutaneous tissue: Secondary | ICD-10-CM | POA: Diagnosis not present

## 2022-07-24 DIAGNOSIS — G8929 Other chronic pain: Secondary | ICD-10-CM | POA: Diagnosis not present

## 2022-07-24 DIAGNOSIS — R1031 Right lower quadrant pain: Secondary | ICD-10-CM | POA: Diagnosis not present

## 2022-07-24 DIAGNOSIS — Z79890 Hormone replacement therapy (postmenopausal): Secondary | ICD-10-CM | POA: Diagnosis not present

## 2022-07-24 DIAGNOSIS — R300 Dysuria: Secondary | ICD-10-CM | POA: Diagnosis not present

## 2022-07-24 DIAGNOSIS — R1084 Generalized abdominal pain: Secondary | ICD-10-CM | POA: Diagnosis not present

## 2022-07-24 DIAGNOSIS — R1030 Lower abdominal pain, unspecified: Secondary | ICD-10-CM | POA: Diagnosis not present

## 2022-07-24 DIAGNOSIS — M549 Dorsalgia, unspecified: Secondary | ICD-10-CM | POA: Diagnosis not present

## 2022-07-24 DIAGNOSIS — Z9889 Other specified postprocedural states: Secondary | ICD-10-CM | POA: Diagnosis not present

## 2022-07-24 DIAGNOSIS — Z5189 Encounter for other specified aftercare: Secondary | ICD-10-CM | POA: Diagnosis not present

## 2022-07-24 DIAGNOSIS — E079 Disorder of thyroid, unspecified: Secondary | ICD-10-CM | POA: Diagnosis not present

## 2022-07-24 DIAGNOSIS — K219 Gastro-esophageal reflux disease without esophagitis: Secondary | ICD-10-CM | POA: Diagnosis not present

## 2022-07-24 DIAGNOSIS — R110 Nausea: Secondary | ICD-10-CM | POA: Diagnosis not present

## 2022-07-26 ENCOUNTER — Ambulatory Visit: Payer: PRIVATE HEALTH INSURANCE | Admitting: Psychiatry

## 2022-07-26 DIAGNOSIS — F4310 Post-traumatic stress disorder, unspecified: Secondary | ICD-10-CM | POA: Diagnosis not present

## 2022-07-26 NOTE — Progress Notes (Signed)
[facility] Counselor/Therapist Progress Note  Patient ID: Peggy Ruiz, MRN: [id number],    Date: 07/26/2022  Time Spent: 59 minutes start time 2:01 PM end time 3 PM Virtual Visit via Video Note Connected with patient by a telemedicine/telehealth application, with their informed consent, and verified patient privacy and that I am speaking with the correct person using two identifiers. I discussed the limitations, risks, security and privacy concerns of performing psychotherapy and the availability of in person appointments. I also discussed with the patient that there may be a patient responsible charge related to this service. The patient expressed understanding and agreed to proceed. I discussed the treatment planning with the patient. The patient was provided an opportunity to ask questions and all were answered. The patient agreed with the plan and demonstrated an understanding of the instructions. The patient was advised to call  our office if  symptoms worsen or feel they are in a crisis state and need immediate contact.   Therapist Location: office Patient Location: home    Treatment Type: Individual Therapy  Reported Symptoms: anxiety, panic, sadness, health issues, agitation, triggered responses, rumination, chronic pain, nightmares  Mental Status Exam:              Speech/Language:   Pressured  Affect:  NA  Mood:  anxious  Thought process:  circumstantial  Thought content:    WNL  Sensory/Perceptual disturbances:    WNL  Orientation:  oriented to person, place, time/date, and situation  Attention:  Good  Concentration:  Good  Memory:  WNL  Fund of knowledge:   Good  Insight:    Good  Judgment:   Good  Impulse Control:  Good   Risk Assessment: Danger to Self:  No Self-injurious Behavior: No Danger to Others: No Duty to Warn:no Physical Aggression / Violence:No  Access to Firearms a concern: No  Gang Involvement:No   Subjective: Met with patient via  virtual session. She shared her surgery went well.  The doctor did find endometriosis and she also found that there is another problem.  She shared that her screw from her back surgery is in a dangerous area and she has to get in contact with her neurosurgeon as quickly as possible. She has also been having trouble with recovery from her surgery she just had. She had to go back to the ER after her surgery. Her electrolytes are off and she is retaining fluids and had a localized infection.  She shared she has an appointment with her surgeon next week.  Discussed having her dad take her due to being in recovery and the seriousness of what she has to face. She shared that the thought of another surgery with as bad as she feels currently is overwhelming to her.  Patient was encouraged to try and use her CBT skills to talk herself through the panic and anxiety she was even feeling in session.  She was encouraged to realize that they know the issue that has been creating pain for her for years and now it can be addressed even though it is difficult and scary.  Patient was encouraged to try and focus on how things are working out and she is getting help rather than the what F's and the possibilities of how her body will respond to another surgery.  Patient also shared she is trying to prepare for her move that should take place in April.  Encouraged her to start breaking that down and pieces to  recognize that she has a support system that she needs to call on and to start figuring out what she can do and what she cannot do and making plans around that situation.  Patient was encouraged to take time currently just to recover from the surgery that she has currently been through and then to see where things go next.  Interventions: Cognitive Behavioral Therapy, Solution-Oriented/Positive Psychology, and Insight-Oriented  Diagnosis:   ICD-10-CM   1. Posttraumatic stress disorder  F43.10       Plan: Patient is to use  CBT and coping skills to decrease triggered responses.  Patient is to follow plans to start focusing on things that have worked out in ways that she can imagine and to remind herself that she has a support system that she can start using to help her get through the move.  Patient is to work on affirming herself regularly that she is safe and thinking through what to do to help her feel safe.  Patient is to start looking into the possibility of a service dog.  Patient is to work on Advertising copywriter on her mirror to repeat regularly.  Patient is to take things in small steps so that she is able to accomplish tasks.  Patient is to take medication as directed.   Patient is to work on focusing on the things that she can accomplish.  Patient is to work on Dr. Chrys Racer leaf on the neuro cycle detox.  Patient is to continue working with providers on medical issues.  Long-term goal: Interact normally with friends and family without irrational fears or intrusive thoughts that control behavior.  Display a full range of emotions without experiencing loss of control Short-term goal: Identify and replace negative self talk and catastrophic sizing that is associated with past trauma and current stimuli triggers for anxiety    Lina Sayre, [facility]

## 2022-08-02 DIAGNOSIS — M961 Postlaminectomy syndrome, not elsewhere classified: Secondary | ICD-10-CM | POA: Diagnosis not present

## 2022-08-03 DIAGNOSIS — L231 Allergic contact dermatitis due to adhesives: Secondary | ICD-10-CM | POA: Diagnosis not present

## 2022-08-09 ENCOUNTER — Ambulatory Visit: Payer: PRIVATE HEALTH INSURANCE | Admitting: Psychiatry

## 2022-08-11 DIAGNOSIS — K580 Irritable bowel syndrome with diarrhea: Secondary | ICD-10-CM | POA: Diagnosis not present

## 2022-08-11 DIAGNOSIS — R634 Abnormal weight loss: Secondary | ICD-10-CM | POA: Diagnosis not present

## 2022-08-11 DIAGNOSIS — R112 Nausea with vomiting, unspecified: Secondary | ICD-10-CM | POA: Diagnosis not present

## 2022-08-11 DIAGNOSIS — K30 Functional dyspepsia: Secondary | ICD-10-CM | POA: Diagnosis not present

## 2022-08-16 DIAGNOSIS — R634 Abnormal weight loss: Secondary | ICD-10-CM | POA: Diagnosis not present

## 2022-08-23 ENCOUNTER — Ambulatory Visit: Payer: PRIVATE HEALTH INSURANCE | Admitting: Psychiatry

## 2022-08-23 DIAGNOSIS — F4310 Post-traumatic stress disorder, unspecified: Secondary | ICD-10-CM

## 2022-08-23 NOTE — Progress Notes (Signed)
[facility] Counselor/Therapist Progress Note  Patient ID: Peggy Ruiz, MRN: [id number],    Date: 08/23/2022  Time Spent: 56 minutes start time 2:01 PM end time 2:57 PM  Treatment Type: Individual Therapy  Reported Symptoms: fatigue, anxiety, health issues, triggered responses, fatigue, depression, chronic pain  Mental Status Exam:  Appearance:   Well Groomed     Behavior:  Appropriate  Motor:  Normal  Speech/Language:   Normal Rate  Affect:  Appropriate  Mood:  anxious  Thought process:  normal  Thought content:    WNL  Sensory/Perceptual disturbances:    WNL  Orientation:  oriented to person, place, time/date, and situation  Attention:  Good  Concentration:  Good  Memory:  WNL  Fund of knowledge:   Good  Insight:    Good  Judgment:   Good  Impulse Control:  Good   Risk Assessment: Danger to Self:  No Self-injurious Behavior: No Danger to Others: No Duty to Warn:no Physical Aggression / Violence:No  Access to Firearms a concern: No  Gang Involvement:No   Subjective: Patient was present for session. She shared that she is moving in a month and that is stressful due to her just recovering from her surgery. She went on to share that she ended up with a dermitis after the surgery. She is losing weight every week and she had to go back into clothes from 2017 and that is triggering for her. She is waking up shaking BP is low heart rate is high but blood work isn't showing issues. She shared she is having chronic anxiety over the whole situation. She shared that her back surgeon wouldn't look at the pictures from her surgery so she is going to have to see someone else.  Patient went on to share she is having lots of fear and anxiety about what is going to happen in the near future.  She has had that time with her and she had to have a feeding tube and go to the [facility] triggered from the weight loss.  Patient stated she is trying what she can to eat but nothing seems to  be putting weight on her.  Discussed different ways that she can try and talk herself through making small steps towards eating and taking care of herself.  She acknowledged that there is lots of stress with her husband and it is impacting his job performance which is creating more stress with her.  Encouraged patient to try and focus on the good things about her husband and and to try and remind herself and him of things that have worked out positively.  She was reminded that even though things are difficult currently she has been able to get through worse and she can potentially continue getting through harder things.  The importance of keeping the positive at the forefront of her brain was discussed with the patient and she was reminded that she is enough and has to focus on that truth.  Patient was also encouraged to write down facts so that she can read them as she needs to.  Interventions: Cognitive Behavioral Therapy and Solution-Oriented/Positive Psychology  Diagnosis:   ICD-10-CM   1. Posttraumatic stress disorder  F43.10       Plan: Patient is to use CBT and coping skills to decrease triggered responses.  Patient is to follow plans to make sure she is focusing on things that have worked out and the positives each day.  She is to  start writing them down and reading in them as she needs to.  Patient is to work on affirming herself regularly that she is safe and thinking through what to do to help her feel safe.  Patient is to start looking into the possibility of a service dog.  Patient is to work on Advertising copywriter on her mirror to repeat regularly.  Patient is to take things in small steps so that she is able to accomplish tasks.  Patient is to take medication as directed.   Patient is to work on focusing on the things that she can accomplish.  Patient is to work on Dr. Chrys Racer leaf on the neuro cycle detox.  Patient is to continue working with providers on medical issues.  Long-term goal:  Interact normally with friends and family without irrational fears or intrusive thoughts that control behavior.  Display a full range of emotions without experiencing loss of control Short-term goal: Identify and replace negative self talk and catastrophic sizing that is associated with past trauma and current stimuli triggers for anxiety  Lina Sayre, [facility]

## 2022-08-25 DIAGNOSIS — Z981 Arthrodesis status: Secondary | ICD-10-CM | POA: Diagnosis not present

## 2022-08-25 DIAGNOSIS — M5136 Other intervertebral disc degeneration, lumbar region: Secondary | ICD-10-CM | POA: Diagnosis not present

## 2022-08-25 DIAGNOSIS — M47816 Spondylosis without myelopathy or radiculopathy, lumbar region: Secondary | ICD-10-CM | POA: Diagnosis not present

## 2022-08-25 DIAGNOSIS — M4316 Spondylolisthesis, lumbar region: Secondary | ICD-10-CM | POA: Diagnosis not present

## 2022-08-25 DIAGNOSIS — M4186 Other forms of scoliosis, lumbar region: Secondary | ICD-10-CM | POA: Diagnosis not present

## 2022-08-25 DIAGNOSIS — Z1330 Encounter for screening examination for mental health and behavioral disorders, unspecified: Secondary | ICD-10-CM | POA: Diagnosis not present

## 2022-09-06 ENCOUNTER — Ambulatory Visit: Payer: PRIVATE HEALTH INSURANCE | Admitting: Psychiatry

## 2022-09-06 DIAGNOSIS — F4310 Post-traumatic stress disorder, unspecified: Secondary | ICD-10-CM

## 2022-09-06 DIAGNOSIS — Z981 Arthrodesis status: Secondary | ICD-10-CM | POA: Diagnosis not present

## 2022-09-06 NOTE — Progress Notes (Signed)
[facility] Counselor/Therapist Progress Note  Patient ID: Peggy Ruiz, MRN: [id number],    Date: 09/06/2022  Time Spent: 50 minutes start time 1:10 PM end time 2 PM  Treatment Type: Individual Therapy  Reported Symptoms: anxiety, weight loss, triggered responses, health issues, ruminations, chronic pain issues, sleep issues  Mental Status Exam:  Appearance:   Well Groomed     Behavior:  Appropriate  Motor:  Normal  Speech/Language:   Normal Rate  Affect:  Appropriate  Mood:  anxious  Thought process:  normal  Thought content:    WNL  Sensory/Perceptual disturbances:    WNL  Orientation:  oriented to person, place, time/date, and situation  Attention:  Good  Concentration:  Good  Memory:  WNL  Fund of knowledge:   Good  Insight:    Good  Judgment:   Good  Impulse Control:  Good   Risk Assessment: Danger to Self:  No Self-injurious Behavior: No Danger to Others: No Duty to Warn:no Physical Aggression / Violence:No  Access to Firearms a concern: No  Gang Involvement:No   Subjective: Patient was present for session. She shared she is set to move still in 2 weeks and she  is getting it all organized. She went on to share she lost 3 lbs in 3 days that upset her but she has leveled off.  She is asked about her calorie intact which is triggering for her but she is trying to maintain her thoughts. She has talked to more doctors about the issues in her back after the tests they have been able to see that her arthritis has increased. She reported at this point she is just going to deal with the pain in her back.  Patient was encouraged to think through the different times in her life that were being triggered currently and to discuss what the facts are at this time.  She was encouraged to keep those at the front of her brain and to recognize that she is not the same person and her life is in a different situation.  She was also encouraged to focus on the positive changes and  the things that she still can do even with the limitations that she currently has.  Patient was able to recognize that having friends and family that are helping them through their moves is a blessing and that she can focus more energy on those sorts of positives rather than on the pain she is feeling and feeling frustrated with her health situation.  Interventions: Cognitive Behavioral Therapy and Solution-Oriented/Positive Psychology  Diagnosis:   ICD-10-CM   1. Posttraumatic stress disorder  F43.10       Plan: Patient is to use CBT and coping skills to decrease triggered responses.  Patient is to  focus on things that have worked out and the positives each day.  She is to start writing them down and reading in them as she needs to.  Patient is to work on affirming herself regularly that she is safe and thinking through what to do to help her feel safe.  Patient is to start looking into the possibility of a service dog.  Patient is to work on Advertising copywriter on her mirror to repeat regularly.  Patient is to take things in small steps so that she is able to accomplish tasks.  Patient is to take medication as directed.   Patient is to work on focusing on the things that she can accomplish.  Patient is  to work on Dr. Chrys Racer leaf on the neuro cycle detox.  Patient is to continue working with providers on medical issues.  Long-term goal: Interact normally with friends and family without irrational fears or intrusive thoughts that control behavior.  Display a full range of emotions without experiencing loss of control Short-term goal: Identify and replace negative self talk and catastrophic sizing that is associated with past trauma and current stimuli triggers for anxiety  Lina Sayre, [facility]

## 2022-09-08 DIAGNOSIS — K219 Gastro-esophageal reflux disease without esophagitis: Secondary | ICD-10-CM | POA: Diagnosis not present

## 2022-09-08 DIAGNOSIS — R634 Abnormal weight loss: Secondary | ICD-10-CM | POA: Diagnosis not present

## 2022-09-08 DIAGNOSIS — Z981 Arthrodesis status: Secondary | ICD-10-CM | POA: Diagnosis not present

## 2022-09-08 DIAGNOSIS — M79604 Pain in right leg: Secondary | ICD-10-CM | POA: Diagnosis not present

## 2022-09-08 DIAGNOSIS — R630 Anorexia: Secondary | ICD-10-CM | POA: Diagnosis not present

## 2022-09-08 DIAGNOSIS — R112 Nausea with vomiting, unspecified: Secondary | ICD-10-CM | POA: Diagnosis not present

## 2022-09-16 ENCOUNTER — Ambulatory Visit: Payer: PRIVATE HEALTH INSURANCE | Admitting: Psychiatry

## 2022-09-20 ENCOUNTER — Ambulatory Visit: Payer: PRIVATE HEALTH INSURANCE | Admitting: Psychiatry

## 2022-09-22 ENCOUNTER — Telehealth: Payer: Self-pay | Admitting: Psychiatry

## 2022-09-22 NOTE — Telephone Encounter (Signed)
CMM sent PA request for Rexulti 1.0mg 

## 2022-09-28 ENCOUNTER — Telehealth: Payer: Self-pay

## 2022-09-28 NOTE — Telephone Encounter (Signed)
PA renewal received for Rexulti 1 mg but pt not on this dose. Archive with CMM.

## 2022-09-29 DIAGNOSIS — M961 Postlaminectomy syndrome, not elsewhere classified: Secondary | ICD-10-CM | POA: Diagnosis not present

## 2022-09-29 DIAGNOSIS — M5416 Radiculopathy, lumbar region: Secondary | ICD-10-CM | POA: Diagnosis not present

## 2022-10-04 ENCOUNTER — Ambulatory Visit: Payer: PRIVATE HEALTH INSURANCE | Admitting: Psychiatry

## 2022-10-04 ENCOUNTER — Encounter: Payer: Self-pay | Admitting: Psychiatry

## 2022-10-04 DIAGNOSIS — F411 Generalized anxiety disorder: Secondary | ICD-10-CM | POA: Diagnosis not present

## 2022-10-04 DIAGNOSIS — F4310 Post-traumatic stress disorder, unspecified: Secondary | ICD-10-CM

## 2022-10-04 DIAGNOSIS — F3342 Major depressive disorder, recurrent, in full remission: Secondary | ICD-10-CM

## 2022-10-04 MED ORDER — CLONAZEPAM 0.5 MG PO TABS: 0.5000 mg | ORAL_TABLET | Freq: Two times a day (BID) | ORAL | 5 refills | Status: DC

## 2022-10-04 MED ORDER — CLONAZEPAM 1 MG PO TABS: 1.0000 mg | ORAL_TABLET | Freq: Every day | ORAL | 5 refills | Status: DC

## 2022-10-04 MED ORDER — BUPROPION HCL ER (XL) 150 MG PO TB24: 150.0000 mg | ORAL_TABLET | Freq: Every day | ORAL | 1 refills | Status: DC

## 2022-10-04 NOTE — Progress Notes (Unsigned)
Peggy Ruiz [id number] 1985-11-28 36 y.o.  Subjective:   Patient ID:  Peggy Ruiz is a 37 y.o. (DOB 12/27/1985) female.  Chief Complaint:  Chief Complaint  Patient presents with   Anxiety    HPI Peggy Ruiz presents to the office today for follow-up of anxiety, depression, and insomnia.   She reports worsening anxiety and mood lability since starting new birth control pill. She reports that she is crying daily since starting birth control pill. She has also been having menstrual bleeding, headaches, and dizziness with birth control pill. She reports that she was sleeping "great" until starting birth control pill. She reports that she is now awakening multiple times a night. She reports that she wakes up fatigued in the morning. Energy has been low and has more fatigue. Motivation has been good and wanting to get unpacked, "but my body does not want to cooperate." She reports, "I'm more sad these days" and attributes this to the birth control pill since she reports that there are some positive things occurring. Concentration has "been about the same." She has been having panic attacks at times. Denies SI.  Moved 10 days ago. Tried to close on sale of their house and the new house on the same day. Closing was delayed and they had to find somewhere to stay and store their things for 7-10 days until they could close on Good Friday. They then moved Friday evening. She has increased pain related to moving.   She reports that she has been losing weight and then gained 3 lbs while they were staying in a hotel. She started losing weight again when they moved in and started eating healthier. She reports that she has early satiety. She estimates consuming 1200-1500 kcal daily.   Klonopin 1 mg was last filled 09/16/22 Klonopin 0.5 mg last filled 09/07/22 x1  Past Psychiatric Medication Trials: Cymbalta- Effective for a period of time and then no longer as effective.  Prozac Lexapro Paxil-  Initially helped and then did not seem to be as effective when used without Zyprexa Effexor Nortriptyline-prescribed by GI for gastroparesis Remeron-caused insomnia Buspar- Adverse reaction Abilify-involuntary movements Zyprexa-effective and took for about 14 years.  Caused excessive weight gain at times. Seroquel-Ineffective. Felt like a "zombie."  Latuda-effective. Sleeping "harder" on 80 mg. Lamictal Gabapentin- Cannot tolerate doses higher than 800 mg BID Trileptal- Reports that she was unable to tolerate 900 mg QHS Belsomra-helpful with sleep quality but not sleep quantity. Klonopin Ativan Librium Clonidine- Effective Propranolol Trazodone Ambien Nuvigil Adderall Deplin    AIMS    Flowsheet Row Office Visit from 10/04/2022 in [facility] Office Visit from 03/03/2022 in [facility] Office Visit from 12/21/2021 in [facility] Office Visit from 02/26/2021 in [facility] Office Visit from 12/22/2020 in [facility]  AIMS Total Score 0 0 0 0 0        Review of Systems:  Review of Systems  Gastrointestinal:        Reports GI symptoms are consistent with baseline  Genitourinary:  Positive for menstrual problem.       Had surgery for endometriosis  Musculoskeletal:  Positive for back pain. Negative for gait problem.  Neurological:  Positive for dizziness and headaches. Negative for tremors.  Psychiatric/Behavioral:         Please refer to HPI   Had hives after surgery.  Medications: I have reviewed the patient's current  medications.  Current Outpatient Medications  Medication Sig Dispense Refill   brexpiprazole (REXULTI) 2 MG TABS tablet Take 1 tablet (2 mg total) by mouth daily. 90 tablet 1   cyclobenzaprine (FLEXERIL) 10 MG tablet Take 10 mg by mouth 3 (three) times daily as needed for muscle spasms.     dexlansoprazole (DEXILANT)  60 MG capsule Take 60 mg by mouth every morning.     Drospirenone (SLYND) 4 MG TABS Take by mouth.     gabapentin (NEURONTIN) 800 MG tablet Take 800 mg by mouth 2 (two) times daily.     levothyroxine (SYNTHROID, LEVOTHROID) 50 MCG tablet Take 50 mcg by mouth daily.     Lifitegrast (XIIDRA) 5 % SOLN Apply to eye.     magnesium 30 MG tablet Take 30 mg by mouth 2 (two) times daily.     Multiple Vitamin (MULTIVITAMIN) tablet Take 1 tablet by mouth daily.     nortriptyline (PAMELOR) 10 MG capsule Take 1 capsule (10 mg total) by mouth at bedtime. 90 capsule 0   NUCYNTA 50 MG tablet Take 50 mg by mouth 3 (three) times daily as needed.     ondansetron (ZOFRAN-ODT) 8 MG disintegrating tablet Take 8 mg by mouth every 8 (eight) hours as needed for nausea.     buPROPion (WELLBUTRIN XL) 150 MG 24 hr tablet Take 1 tablet (150 mg total) by mouth daily. 90 tablet 1   [START ON 10/05/2022] clonazePAM (KLONOPIN) 0.5 MG tablet Take 1 tablet (0.5 mg total) by mouth 2 (two) times daily. Take 1 mg tab at bedtime 60 tablet 5   [START ON 10/14/2022] clonazePAM (KLONOPIN) 1 MG tablet Take 1 tablet (1 mg total) by mouth at bedtime. 30 tablet 5   famotidine (PEPCID) 20 MG tablet Take 20 mg by mouth 2 (two) times daily.  (Patient not taking: Reported on 07/19/2022)     morphine (MSIR) 15 MG tablet morphine 15 mg immediate release tablet  Take 1 tablet 3 times a day by oral route as needed. (Patient not taking: Reported on 07/19/2022)     No current facility-administered medications for this visit.    Medication Side Effects: None  Allergies:  Allergies  Allergen Reactions   Promethazine     Other reaction(s): Agitation   Ciprofloxacin Other (See Comments)    c-diff   Dilaudid [Hydromorphone]     sob   Epinephrine    Penicillins Hives    Has patient had a PCN reaction causing immediate rash, facial/tongue/throat swelling, SOB or lightheadedness with hypotension: Yes Has patient had a PCN reaction causing severe  rash involving mucus membranes or skin necrosis: No Has patient had a PCN reaction that required hospitalization: No Has patient had a PCN reaction occurring within the last 10 years: Yes If all of the above answers are "NO", then may proceed with Cephalosporin use.    Toradol [Ketorolac Tromethamine] Nausea And Vomiting    Past Medical History:  Diagnosis Date   Anorexia    at 24   Anxiety    Attention deficit disorder (ADD)    Bone spur    Depression    Eczema    HPV (human papilloma virus) infection     Past Medical History, Surgical history, Social history, and Family history were reviewed and updated as appropriate.   Please see review of systems for further details on the patient's review from today.   Objective:   Physical Exam:  Wt 132 lb (59.9 kg)   BMI 19.49  kg/m   Physical Exam Constitutional:      General: She is not in acute distress. Musculoskeletal:        General: No deformity.  Neurological:     Mental Status: She is alert and oriented to person, place, and time.     Coordination: Coordination normal.  Psychiatric:        Attention and Perception: Attention and perception normal. She does not perceive auditory or visual hallucinations.        Mood and Affect: Mood is anxious. Mood is not depressed. Affect is not labile, blunt, angry or inappropriate.        Speech: Speech normal.        Behavior: Behavior normal.        Thought Content: Thought content normal. Thought content is not paranoid or delusional. Thought content does not include homicidal or suicidal ideation. Thought content does not include homicidal or suicidal plan.        Cognition and Memory: Cognition and memory normal.        Judgment: Judgment normal.     Comments: Insight intact     Lab Review:     Component Value Date/Time   NA 139 06/26/2017 0025   NA 138 08/31/2011 1624   K 3.7 06/26/2017 0025   K 4.1 08/31/2011 1624   CL 105 06/26/2017 0025   CL 102 08/31/2011 1624    CO2 25 06/26/2017 0025   CO2 27 08/31/2011 1624   GLUCOSE 110 (H) 06/26/2017 0025   GLUCOSE 81 08/31/2011 1624   BUN 9 06/26/2017 0025   BUN 10 08/31/2011 1624   CREATININE 0.88 06/26/2017 0025   CREATININE 0.88 08/31/2011 1624   CALCIUM 9.1 06/26/2017 0025   CALCIUM 9.1 08/31/2011 1624   PROT 7.4 06/26/2017 0025   PROT 7.8 08/31/2011 1624   ALBUMIN 4.5 06/26/2017 0025   ALBUMIN 4.0 08/31/2011 1624   AST 19 06/26/2017 0025   AST 25 08/31/2011 1624   ALT 14 06/26/2017 0025   ALT 24 08/31/2011 1624   ALKPHOS 49 06/26/2017 0025   ALKPHOS 52 08/31/2011 1624   BILITOT 0.5 06/26/2017 0025   BILITOT 0.3 08/31/2011 1624   GFRNONAA >60 06/26/2017 0025   GFRNONAA >60 08/31/2011 1624   GFRAA >60 06/26/2017 0025   GFRAA >60 08/31/2011 1624       Component Value Date/Time   WBC 6.4 06/26/2017 0025   RBC 3.85 06/26/2017 0025   HGB 12.1 06/26/2017 0025   HGB 12.7 08/31/2011 1624   HCT 34.8 (L) 06/26/2017 0025   HCT 37.7 08/31/2011 1624   PLT 246 06/26/2017 0025   PLT 248 08/31/2011 1624   MCV 90.3 06/26/2017 0025   MCV 90 08/31/2011 1624   MCH 31.5 06/26/2017 0025   MCHC 34.9 06/26/2017 0025   RDW 11.9 06/26/2017 0025   RDW 12.6 08/31/2011 1624   LYMPHSABS 2.2 04/07/2017 1145   MONOABS 0.4 04/07/2017 1145   EOSABS 0.1 04/07/2017 1145   BASOSABS 0.0 04/07/2017 1145    No results found for: "POCLITH", "LITHIUM"   No results found for: "PHENYTOIN", "PHENOBARB", "VALPROATE", "CBMZ"   .res Assessment: Plan:   Will continue current plan of care since target signs and symptoms are well controlled without any tolerability issues. She reports that she has contacted obgyn about discontinuing OCP due to adverse effects.  Will cancel any remaining scripts on file for Klonopin at the [facility] in [place], [place] and will send new scripts to the [facility] in [place].  Continue Rexulti 2 mg po qd for anxiety and depression.  Continue Wellbutrin XL 150 mg daily for depression.  Continue  Klonopin 1 mg po QHS for anxiety and insomnia.  Continue Klonopin 0.5 mg twice daily for anxiety.  Nortriptyline managed by GI specialist. [facility].  Pt to follow-up in 2 months or sooner if clinically indicated.  Patient advised to contact office with any questions, adverse effects, or acute worsening in signs and symptoms.   Peggy RuizLeah was seen today for anxiety.  Diagnoses and all orders for this visit:  Generalized anxiety disorder -     clonazePAM (KLONOPIN) 1 MG tablet; Take 1 tablet (1 mg total) by mouth at bedtime. -     clonazePAM (KLONOPIN) 0.5 MG tablet; Take 1 tablet (0.5 mg total) by mouth 2 (two) times daily. Take 1 mg tab at bedtime  PTSD (post-traumatic stress disorder) -     clonazePAM (KLONOPIN) 1 MG tablet; Take 1 tablet (1 mg total) by mouth at bedtime. -     clonazePAM (KLONOPIN) 0.5 MG tablet; Take 1 tablet (0.5 mg total) by mouth 2 (two) times daily. Take 1 mg tab at bedtime  Recurrent major depressive disorder, in full remission -     buPROPion (WELLBUTRIN XL) 150 MG 24 hr tablet; Take 1 tablet (150 mg total) by mouth daily.     Please see After Visit Summary for patient specific instructions.  Future Appointments  Date Time Provider Department Center  10/27/2022 12:00 PM Peggy [facility]usengram, [place] None  11/17/2022 12:00 PM [facility] [place] None  12/01/2022 12:00 PM [facility]gram, [place] None  12/06/2022 12:45 PM Corie Chiquitoarter, [place], [facility]HNP CP-CP None  12/15/2022  1:00 PM Peggy [facility]gram, [place], [facility] None  01/05/2023 12:00 PM [facility]gram, [facility] None  01/19/2023  1:00 PM Peggy MeuseIngram, [place], [facility] None    No orders of the defined types were placed in this encounter.   -------------------------------

## 2022-10-04 NOTE — Progress Notes (Signed)
[facility] Counselor/Therapist Progress Note  Patient ID: Peggy Ruiz, MRN: [id number],    Date: 10/04/2022  Time Spent: 63 minutes start time 1:52 PM end time 2:55 PM  Treatment Type: Individual Therapy  Reported Symptoms: chronic pain, anxiety, triggered responses, sadness, health issues, panic, irritability,rumination, intrusive thoughts  Mental Status Exam:  Appearance:   Well Groomed     Behavior:  Appropriate  Motor:  Normal  Speech/Language:   Normal Rate  Affect:  Appropriate  Mood:  anxious  Thought process:  normal  Thought content:    WNL  Sensory/Perceptual disturbances:    WNL  Orientation:  oriented to person, place, time/date, and situation  Attention:  Good  Concentration:  Good  Memory:  WNL  Fund of knowledge:   Good  Insight:    Good  Judgment:   Good  Impulse Control:  Good   Risk Assessment: Danger to Self:  No Self-injurious Behavior: No Danger to Others: No Duty to Warn:no Physical Aggression / Violence:No  Access to Firearms a concern: No  Gang Involvement:No   Subjective: Patient was present for session. She shared they did get in their home but there were lots of issues in the process. She also started a new birth control pill and she shared it brought back panic attacks and mood issues. She is in more pain due to packing.She went on to share that her mother is still recovering from her back surgery and her dad just had knee surgery.  Patient explained she just feels it is all a lot and not going to change.  She did get to meet up with a friend that she has had online for a long time and that is a positive.She went on to explain that she doesn't know how she got here with all the pain she has. She shared everyday is the same-pain, fatigue, GI problems... Patient explained she has no thoughts of wanting to harm herself but does not really want to be here due to the pain issues and not knowing what her purposes.  Patient stated she feels more  like a burden than anything else most of the time.  Patient stated she knows she has certain gifts but because of her physical limitations she is unable to use them and is not able to do any of the things that she can could to be helpful and love others in a better way.  Allowed patient time to share all of her feelings and concerns and then redirected the conversation to think through what the facts are and what she could still do.  Patient was encouraged to make all the different things that she does at home including maintaining bills making sure phone calls are made handling the mail and several other things that do not take as much physical ability but does take some mental availability.  As discussion unfolded patient was able to recall her husband sharing how important all of those things are since he is unable to feel cognitive when he gets home from work after problem solving all day long.  Patient was also able to recognize that she still can encourage people and she can let other people even with some of her physical limitations.  Encouraged patient to write down all the things that she can do and to remind herself of those truce when she is starting to feel like a burden.  Patient was also encouraged to start working on being thankful for what she  has even in the difficult situations.  Patient was able to acknowledge that when she is able to think of a heart of gratitude she does feel better.  Patient agreed to work on plans from session.  Interventions: Cognitive Behavioral Therapy and Solution-Oriented/Positive Psychology  Diagnosis:   ICD-10-CM   1. Posttraumatic stress disorder  F43.10       Plan: Patient is to use CBT and coping skills to decrease triggered responses.  Patient is to  focus on things that she can do and the things that she is thankful for even in hard situations..  She is to start writing them down and reading in them as she needs to.  Patient is to work on affirming  herself regularly that she is safe and thinking through what to do to help her feel safe.  Patient is to start looking into the possibility of a service dog.  Patient is to work on Psychologist, sport and exercise on her mirror to repeat regularly.  Patient is to take things in small steps so that she is able to accomplish tasks.  Patient is to take medication as directed.   Patient is to work on focusing on the things that she can accomplish.  Patient is to work on Dr. Rayfield Citizen leaf on the neuro cycle detox.  Patient is to continue working with providers on medical issues.  Long-term goal: Interact normally with friends and family without irrational fears or intrusive thoughts that control behavior.  Display a full range of emotions without experiencing loss of control Short-term goal: Identify and replace negative self talk and catastrophic sizing that is associated with past trauma and current stimuli triggers for anxiety    Stevphen Meuse, [facility]

## 2022-10-18 ENCOUNTER — Ambulatory Visit: Payer: PRIVATE HEALTH INSURANCE | Admitting: Psychiatry

## 2022-10-21 DIAGNOSIS — R112 Nausea with vomiting, unspecified: Secondary | ICD-10-CM | POA: Diagnosis not present

## 2022-10-21 DIAGNOSIS — R634 Abnormal weight loss: Secondary | ICD-10-CM | POA: Diagnosis not present

## 2022-10-21 DIAGNOSIS — R630 Anorexia: Secondary | ICD-10-CM | POA: Diagnosis not present

## 2022-10-21 DIAGNOSIS — K219 Gastro-esophageal reflux disease without esophagitis: Secondary | ICD-10-CM | POA: Diagnosis not present

## 2022-10-21 DIAGNOSIS — K3189 Other diseases of stomach and duodenum: Secondary | ICD-10-CM | POA: Diagnosis not present

## 2022-10-21 DIAGNOSIS — D132 Benign neoplasm of duodenum: Secondary | ICD-10-CM | POA: Diagnosis not present

## 2022-10-21 DIAGNOSIS — K2970 Gastritis, unspecified, without bleeding: Secondary | ICD-10-CM | POA: Diagnosis not present

## 2022-10-27 ENCOUNTER — Ambulatory Visit: Payer: PRIVATE HEALTH INSURANCE | Admitting: Psychiatry

## 2022-10-27 ENCOUNTER — Encounter: Payer: Self-pay | Admitting: Psychiatry

## 2022-10-27 DIAGNOSIS — F4310 Post-traumatic stress disorder, unspecified: Secondary | ICD-10-CM | POA: Diagnosis not present

## 2022-10-27 NOTE — Progress Notes (Signed)
[facility] Counselor/Therapist Progress Note  Patient ID: Peggy Ruiz, MRN: [id number],    Date: 10/27/2022  Time Spent: 57 minutes start time 12:06 PM end time 1:03  Treatment Type: Individual Therapy  Reported Symptoms: anxiety, panic, sadness, irritability, triggered responses, health issues, nightmares, sleep issues, fatigue, hypervigilance  Mental Status Exam:  Appearance:   Well Groomed     Behavior:  Appropriate  Motor:  Normal  Speech/Language:   Normal Rate  Affect:  Appropriate  Mood:  anxious  Thought process:  normal  Thought content:    WNL  Sensory/Perceptual disturbances:    WNL  Orientation:  oriented to person, place, time/date, and situation  Attention:  Good  Concentration:  Good  Memory:  WNL  Fund of knowledge:   Good  Insight:    Good  Judgment:   Good  Impulse Control:  Good   Risk Assessment: Danger to Self:  No Self-injurious Behavior: No Danger to Others: No Duty to Warn:no Physical Aggression / Violence:No  Access to Firearms a concern: No  Gang Involvement:No   Subjective: Patient was present for session. She shared that she is getting settled in with her new home and that is going well.  She shared that her husband can't get her vasectomy for 6 months so she has to stay on her birth control pills until that time which makes her mood difficult. She went on to say that she is realizing that she gets triggered by moving due to her senses being heighten until she gets used to the sounds in the house. She shared she is struggling due to still losing weight because of the stomach issues. She is going to see her provider again in 2 weeks because of the issue. She shared she is getting very self conscious about her body as well.  She went on to share that the biggest issue currently is just she is having panic over finances.  She shared that she is not sure what triggers it but it may have to do with when she was younger her parents bought a house  and within a month of it being built a started having water issues and foundational issues and her parents ended up filing bankruptcy and they had to move to [place] with their grandparents which was not a good thing.  Patient did processing set on the price of coffee, Suds level 9, Negative cognition "I am not worth it" felt sad and anxious in her stomach.  Patient was able to reduce suds level to 4.  Through the processing she was able to feel better and to recognize that things are not going to always go the way that she wants them to but there have been things that have worked out.  Patient was encouraged to focus on the things that have worked out and things that she can control rather than things that are not within her control.  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 Posttraumatic stress disorder  F43.10       Plan: Patient is to use CBT and coping skills to decrease triggered responses.  Patient is to  focus on things that she can do and the things that she is thankful for even in hard situations..  She is to start writing them down and reading in them as  she needs to.  Patient is to work on affirming herself regularly that she is safe and thinking through what to do to help her feel safe.  Patient is to start looking into the possibility of a service dog.  Patient is to work on Psychologist, sport and exercise on her mirror to repeat regularly.  Patient is to take things in small steps so that she is able to accomplish tasks.  Patient is to take medication as directed.   Patient is to work on focusing on the things that she can accomplish.  Patient is to work on Dr. Rayfield Citizen leaf on the neuro cycle detox.  Patient is to continue working with providers on medical issues.  Long-term goal: Interact normally with friends and family without  irrational fears or intrusive thoughts that control behavior.  Display a full range of emotions without experiencing loss of control Short-term goal: Identify and replace negative self talk and catastrophic sizing that is associated with past trauma and current stimuli triggers for anxiety  Stevphen Meuse, [facility]

## 2022-11-01 ENCOUNTER — Ambulatory Visit: Payer: PRIVATE HEALTH INSURANCE | Admitting: Psychiatry

## 2022-11-14 DIAGNOSIS — Z124 Encounter for screening for malignant neoplasm of cervix: Secondary | ICD-10-CM | POA: Diagnosis not present

## 2022-11-14 DIAGNOSIS — Z01419 Encounter for gynecological examination (general) (routine) without abnormal findings: Secondary | ICD-10-CM | POA: Diagnosis not present

## 2022-11-14 DIAGNOSIS — Z3200 Encounter for pregnancy test, result unknown: Secondary | ICD-10-CM | POA: Diagnosis not present

## 2022-11-15 ENCOUNTER — Ambulatory Visit: Payer: PRIVATE HEALTH INSURANCE | Admitting: Psychiatry

## 2022-11-16 DIAGNOSIS — R634 Abnormal weight loss: Secondary | ICD-10-CM | POA: Diagnosis not present

## 2022-11-16 DIAGNOSIS — K219 Gastro-esophageal reflux disease without esophagitis: Secondary | ICD-10-CM | POA: Diagnosis not present

## 2022-11-16 DIAGNOSIS — R112 Nausea with vomiting, unspecified: Secondary | ICD-10-CM | POA: Diagnosis not present

## 2022-11-16 DIAGNOSIS — R1013 Epigastric pain: Secondary | ICD-10-CM | POA: Diagnosis not present

## 2022-11-17 ENCOUNTER — Ambulatory Visit: Payer: PRIVATE HEALTH INSURANCE | Admitting: Psychiatry

## 2022-11-17 DIAGNOSIS — F4310 Post-traumatic stress disorder, unspecified: Secondary | ICD-10-CM

## 2022-11-17 NOTE — Progress Notes (Signed)
[facility] Counselor/Therapist Progress Note  Patient ID: QUINESHA THEIN, MRN: [id number],    Date: 11/17/2022  Time Spent: 57  minutes start time 12:06 PM end time 1:03 PM  Treatment Type: Individual Therapy  Reported Symptoms: triggered responses, flashbacks, anxiety, panic, sadness, health issues, stomach problems  Mental Status Exam:  Appearance:   Well Groomed     Behavior:  Appropriate  Motor:  Normal  Speech/Language:   Normal Rate  Affect:  Appropriate and Tearful  Mood:  anxious  Thought process:  normal  Thought content:    WNL  Sensory/Perceptual disturbances:    WNL  Orientation:  oriented to person, place, time/date, and situation  Attention:  Good  Concentration:  Good  Memory:  WNL  Fund of knowledge:   Good  Insight:    Good  Judgment:   Good  Impulse Control:  Good   Risk Assessment: Danger to Self:  No Self-injurious Behavior: No Danger to Others: No Duty to Warn:no Physical Aggression / Violence:No  Access to Firearms a concern: No  Gang Involvement:No   Subjective: Patient was present for session. She shared that something her dad said a week ago triggered something from 15 years ago and it has been bad since than.  She shared she hasn't been able to eat anything.  She shared that he brought up what happened when she was in grad school.  Patient shared what happened with her dad and how he shared regarding helping her with her  loans that are still creating difficulty in her life.  Patient explained that what he said was so much she not been able to function he would do anything.  Patient process that is worth, suds level 10, negative cognition "I am a failure" felt anxiety sadness and disappointment in her stomach.  Patient was able to reduce this for.  She was able to report feeling much better by the end of session.  She was able to realize that she has been helping her parents and taking care of them since she was a child so she does not need to  carry the guilt and shame and feeling like a failure.  Patient was encouraged to continue to remind herself of the truth and to try and take time to his breathing exercises and get back to trying to eat.  Interventions: Eye Movement Desensitization and Reprocessing (EMDR) and Insight-Oriented  Diagnosis:   ICD-10-CM   1. Posttraumatic stress disorder  F43.10       Plan: Patient is to use CBT and coping skills to decrease triggered responses.  Patient is to  focus on things that she can do and the things that she is thankful for even in hard situations.  She is to start writing them down and reading in them as she needs to.  Patient is to work on affirming herself regularly that she is safe and thinking through what to do to help her feel safe.  Patient is to start looking into the possibility of a service dog.  Patient is to work on Psychologist, sport and exercise on her mirror to repeat regularly.  Patient is to take things in small steps so that she is able to accomplish tasks.  Patient is to take medication as directed.   Patient is to work on focusing on the things that she can accomplish.  Patient is to work on Dr. Rayfield Citizen leaf on the neuro cycle detox.  Patient is to continue working with providers on  medical issues.  Long-term goal: Interact normally with friends and family without irrational fears or intrusive thoughts that control behavior.  Display a full range of emotions without experiencing loss of control Short-term goal: Identify and replace negative self talk and catastrophic sizing that is associated with past trauma and current stimuli triggers for anxiety  Stevphen Meuse, [facility]

## 2022-11-23 DIAGNOSIS — E039 Hypothyroidism, unspecified: Secondary | ICD-10-CM | POA: Diagnosis not present

## 2022-11-24 ENCOUNTER — Telehealth: Payer: Self-pay

## 2022-11-24 ENCOUNTER — Other Ambulatory Visit: Payer: Self-pay | Admitting: Psychiatry

## 2022-11-24 NOTE — Telephone Encounter (Signed)
I'm not willing to change clonazepam dose (bc it is controlled substance).  Per GI I think it could be helpful for mood and anxiety to increase nortriptyline to 25 mg HS.  That is still a small dose.  We can send in 1 month supply if she wants to do it.

## 2022-11-24 NOTE — Telephone Encounter (Signed)
Peggy Ruiz's patient. Patient is on clonazepam for anxiety. It is prescribed BID and QHS. Patient recently started on cholestyramine (5/22) by her GI doctor. It was recommended that she take it 4 hours from when she takes clonazepam because it could reduce the effectiveness of the clonazepam. She said she is already feeling the effects   From 5/22 GI note:  I have advised her to discuss with her psychiatrist whether her nortriptyline may be increased to 20 to 25 mg daily, as this may provide an effect as a neuromodulator which could reduce her epigastric pain.   cholestyramine (QUESTRAN) 4 g packet  Take one packet (4 g dose) by mouth daily.

## 2022-11-24 NOTE — Telephone Encounter (Signed)
Patient informed. 

## 2022-11-29 ENCOUNTER — Ambulatory Visit: Payer: PRIVATE HEALTH INSURANCE | Admitting: Psychiatry

## 2022-12-01 ENCOUNTER — Ambulatory Visit: Payer: PRIVATE HEALTH INSURANCE | Admitting: Psychiatry

## 2022-12-01 DIAGNOSIS — F4310 Post-traumatic stress disorder, unspecified: Secondary | ICD-10-CM | POA: Diagnosis not present

## 2022-12-01 NOTE — Progress Notes (Signed)
[facility] Counselor/Therapist Progress Note  Patient ID: Peggy Ruiz, MRN: [id number],    Date: 12/01/2022  Time Spent: 50 minutes start time 12:05 PM end time 12:55 PM  Treatment Type: Individual Therapy  Reported Symptoms: anxiety, panic, triggered responses, eating issues, sadness, chronic pain, intrusive thoughts, irritability  Mental Status Exam:  Appearance:   Well Groomed     Behavior:  Appropriate  Motor:  Normal  Speech/Language:   Normal Rate  Affect:  Appropriate and Tearful  Mood:  anxious and sad  Thought process:  normal  Thought content:    WNL  Sensory/Perceptual disturbances:    WNL  Orientation:  oriented to person, place, time/date, and situation  Attention:  Good  Concentration:  Good  Memory:  WNL  Fund of knowledge:   Good  Insight:    Good  Judgment:   Good  Impulse Control:  Good   Risk Assessment: Danger to Self:   she shared she has thoughts at times, discussed safety plan Self-injurious Behavior: No Danger to Others: No Duty to Warn:no Physical Aggression / Violence:No  Access to Firearms a concern: No  Gang Involvement:No   Subjective: Patient was present for session. She shared that she is on a new GI medication and  it is not working and she is more sick and not able to eat. She shared she is exhausted and in pain so she doesn't know what to do.  She shared she is not sure that her medication is getting absorb correctly. 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 She shared that her husband shared that he still has hope about having a biological child.  Gave patient an opportunity to share how she feels about how to move.  She was able to recognize that she had been dealing with the situation has not having a biological child for many years and her husband needs to get a time to grieve like she is already.  Different ways to talk herself.  This has been working for session.  Also the importance of her  focusing on ways to use all she can.  For her good work discussed with patient.  Patient was able to recognize she has been helpful multiple people at what she is going through and for her to be used for something good Sunday so she is feeling more positive and like that will become her reality fixed her from just feeling like a burden and the disappointment.  Patient was reminded to stay focused on what she can control.  Patient needs and just to love her husband if she goes.  Decreased.  Interventions: Solution-Oriented/Positive Psychology and Insight-Oriented  Diagnosis:   ICD-10-CM   1. Posttraumatic stress disorder  F43.10       Plan: Patient is to use CBT and coping skills to decrease triggered responses.  Patient is to  focus on things that she can do and the things that she is thankful for even in hard situations.  She is to start writing them down and reading in them as she needs to.  Patient is to work on affirming herself regularly that she is safe and thinking through what to do to help her feel safe.  Patient is to start looking into the possibility of a service dog.  Patient is to work on Psychologist, sport and exercise on her mirror to repeat regularly.  Patient is to take things in small steps so that she  is able to accomplish tasks.  Patient is to take medication as directed.   Patient is to work on focusing on the things that she can accomplish.  Patient is to work on Dr. Rayfield Citizen leaf on the neuro cycle detox.  Patient is to continue working with providers on medical issues.  Long-term goal: Interact normally with friends and family without irrational fears or intrusive thoughts that control behavior.  Display a full range of emotions without experiencing loss of control Short-term goal: Identify and replace negative self talk and catastrophic sizing that is associated with past trauma and current stimuli triggers for anxiety  Stevphen Meuse, [facility]

## 2022-12-06 ENCOUNTER — Encounter: Payer: Self-pay | Admitting: Psychiatry

## 2022-12-06 ENCOUNTER — Ambulatory Visit: Payer: PRIVATE HEALTH INSURANCE | Admitting: Psychiatry

## 2022-12-06 DIAGNOSIS — F3342 Major depressive disorder, recurrent, in full remission: Secondary | ICD-10-CM

## 2022-12-06 DIAGNOSIS — F411 Generalized anxiety disorder: Secondary | ICD-10-CM

## 2022-12-06 DIAGNOSIS — G4700 Insomnia, unspecified: Secondary | ICD-10-CM

## 2022-12-06 DIAGNOSIS — F4310 Post-traumatic stress disorder, unspecified: Secondary | ICD-10-CM | POA: Diagnosis not present

## 2022-12-06 MED ORDER — BREXPIPRAZOLE 2 MG PO TABS: 2.0000 mg | ORAL_TABLET | Freq: Every day | ORAL | 1 refills | Status: DC

## 2022-12-06 NOTE — Progress Notes (Unsigned)
Peggy Ruiz [id number] 27-Feb-1986 36 y.o.  Subjective:   Patient ID:  Peggy Ruiz is a 36 y.o. (DOB August 13, 1985) female.  Chief Complaint: No chief complaint on file.   HPI Peggy Ruiz presents to the office today for follow-up of anxiety, depression, and insomnia.   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   She reports that she was prescribed Cholestyramine and feels that this interfered with absorption of Klonopin, and noticed some benzodiazepine withdrawal symptoms. She was told to take Cholestyramine 3 hours apart from her other medications. She reports that GI specialist discontinued Cholestyramine and started Ursodiol about a week ago. She reports that she is having some abdominal cramps with the medication, however she reports nausea and diarrhea have improved. "I'm tired of being in pain and sick." She reports that her appetite is low some mornings due to GI symptoms. Appetite and food intake vary.  She reports that she is "really weepy these days. I get sad very easily." She reports some sadness at times in response to health issues. Denies persistent depression. She reports more negative thoughts when she has more down time. Anxiety has been "more than usual." She reports panic attacks at times. Describes feeling overwhelmed. She is contemplating discontinuing OCP if/when husband has a vasectomy since she feels OCP has been negatively affecting her mood and anxiety. She reports mood lability since OCP. She reports that she may react different to the same trigger from one week to the next. She reports that she may have several consecutive "good days" and then days of increased depression, anxiety, and pain. She reports that her energy has been low. She reports that she will force herself to do things. She reports occasional guilt related to "not being able to contribute... like a burden." Denies SI.   Has  been in new house almost 3 months. Had concrete patio extended. Installing a fence in the future.   Reports that both of her parents are having health issues. Father may need hand surgery and mother may need another spine surgery. Parents live 20 minutes away.   Husband has accepted permanent position with [employer] and moving from contractor position. Husband's work is 20 minutes away. She reports that his over-time is usually scheduled and not as excessive compared to his past job. She reports that his work-life balance has improved and they have more time together.   Klonopin 1 mg last filled 11/17/22 x 2. Klonopin 0.5 mg last filled 11/07/22 x 2.   Past Psychiatric Medication Trials: Cymbalta- Effective for a period of time and then no longer as effective.  Prozac Lexapro Paxil- Initially helped and then did not seem to be as effective when used without Zyprexa Effexor Nortriptyline-prescribed by GI for gastroparesis Remeron-caused insomnia Buspar- Adverse reaction Abilify-involuntary movements Zyprexa-effective and took for about 14 years.  Caused excessive weight gain at times. Seroquel-Ineffective. Felt like a "zombie."  Latuda-effective. Sleeping "harder" on 80 mg. Lamictal Gabapentin- Cannot tolerate doses higher than 800 mg BID Trileptal- Reports that she was unable to tolerate 900 mg QHS Belsomra-helpful with sleep quality but not sleep quantity. Klonopin Ativan Librium Clonidine- Effective Propranolol Trazodone Ambien Nuvigil Adderall Deplin    AIMS    Flowsheet Row Office Visit from 10/04/2022 in [facility] Office Visit from 03/03/2022 in [facility] Office Visit from 12[facility]21/2021 in [facility] Office Visit from 02/26/2021  in [facility] Office Visit from 12/22/2020 in [facility]  AIMS Total Score 0 0 0 0 0        Review of  Systems:  Review of Systems  Gastrointestinal:  Positive for abdominal pain.  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   Musculoskeletal:  Positive for back pain and neck pain. Negative for gait problem.  Psychiatric/Behavioral:         Please refer to HPI    Medications: I have reviewed the patient's current medications.  Current Outpatient Medications  Medication Sig Dispense Refill   brexpiprazole (REXULTI) 2 MG TABS tablet Take 1 tablet (2 mg total) by mouth daily. 90 tablet 1   buPROPion (WELLBUTRIN XL) 150 MG 24 hr tablet Take 1 tablet (150 mg total) by mouth daily. 90 tablet 1   clonazePAM (KLONOPIN) 0.5 MG tablet Take 1 tablet (0.5 mg total) by mouth 2 (two) times daily. Take 1 mg tab at bedtime 60 tablet 5   clonazePAM (KLONOPIN) 1 MG tablet Take 1 tablet (1 mg total) by mouth at bedtime. 30 tablet 5   cyclobenzaprine (FLEXERIL) 10 MG tablet Take 10 mg by mouth 3 (three) times daily as needed for muscle spasms.     dexlansoprazole (DEXILANT) 60 MG capsule Take 60 mg by mouth every morning.     Drospirenone (SLYND) 4 MG TABS Take by mouth.     famotidine (PEPCID) 20 MG tablet Take 20 mg by mouth 2 (two) times daily.  (Patient not taking: Reported on 07/19/2022)     gabapentin (NEURONTIN) 800 MG tablet Take 800 mg by mouth 2 (two) times daily.     levothyroxine (SYNTHROID, LEVOTHROID) 50 MCG tablet Take 50 mcg by mouth daily.     Lifitegrast (XIIDRA) 5 % SOLN Apply to eye.     magnesium 30 MG tablet Take 30 mg by mouth 2 (two) times daily.     morphine (MSIR) 15 MG tablet morphine 15 mg immediate release tablet  Take 1 tablet 3 times a day by oral route as needed. (Patient not taking: Reported on 07/19/2022)     Multiple Vitamin (MULTIVITAMIN) tablet Take 1 tablet by mouth daily.     nortriptyline (PAMELOR) 10 MG capsule Take 1 capsule (10 mg total) by mouth at bedtime. 90 capsule 0   NUCYNTA 50 MG tablet Take  50 mg by mouth 3 (three) times daily as needed.     ondansetron (ZOFRAN-ODT) 8 MG disintegrating tablet Take 8 mg by mouth every 8 (eight) hours as needed for nausea.     No current facility-administered medications for this visit.    Medication Side Effects: None  Allergies:  Allergies  Allergen Reactions   Promethazine     Other reaction(s): Agitation   Ciprofloxacin Other (See Comments)    c-diff   Dilaudid [Hydromorphone]     sob   Epinephrine    Penicillins Hives    Has patient had a PCN reaction causing immediate rash, facial/tongue/throat swelling, SOB or lightheadedness with hypotension: Yes Has patient had a PCN reaction causing severe rash involving mucus membranes or skin necrosis: No Has patient had a PCN reaction that required hospitalization: No Has patient had a PCN reaction occurring within the last 10 years: Yes If all of the above answers are "NO", then may proceed with Cephalosporin use.    Toradol [Ketorolac Tromethamine] Nausea  And Vomiting    Past Medical History:  Diagnosis Date   Anorexia    at 60   Anxiety    Attention deficit disorder (ADD)    Bone spur    Depression    Eczema    HPV (human papilloma virus) infection     Past Medical History, Surgical history, Social history, and Family history were reviewed and updated as appropriate.   Please see review of systems for further details on the patient's review from today.   Objective:   Physical Exam:  There were no vitals taken for this visit.  Physical Exam  Lab Review:     Component Value Date/Time   NA 139 06/26/2017 0025   NA 138 08/31/2011 1624   K 3.7 06/26/2017 0025   K 4.1 08/31/2011 1624   CL 105 06/26/2017 0025   CL 102 08/31/2011 1624   CO2 25 06/26/2017 0025   CO2 27 08/31/2011 1624   GLUCOSE 110 (H) 06/26/2017 0025   GLUCOSE 81 08/31/2011 1624   BUN 9 06/26/2017 0025   BUN 10 08/31/2011 1624   CREATININE 0.88 06/26/2017 0025   CREATININE 0.88 08/31/2011 1624    CALCIUM 9.1 06/26/2017 0025   CALCIUM 9.1 08/31/2011 1624   PROT 7.4 06/26/2017 0025   PROT 7.8 08/31/2011 1624   ALBUMIN 4.5 06/26/2017 0025   ALBUMIN 4.0 08/31/2011 1624   AST 19 06/26/2017 0025   AST 25 08/31/2011 1624   ALT 14 06/26/2017 0025   ALT 24 08/31/2011 1624   ALKPHOS 49 06/26/2017 0025   ALKPHOS 52 08/31/2011 1624   BILITOT 0.5 06/26/2017 0025   BILITOT 0.3 08/31/2011 1624   GFRNONAA >60 06/26/2017 0025   GFRNONAA >60 08/31/2011 1624   GFRAA >60 06/26/2017 0025   GFRAA >60 08/31/2011 1624       Component Value Date/Time   WBC 6.4 06/26/2017 0025   RBC 3.85 06/26/2017 0025   HGB 12.1 06/26/2017 0025   HGB 12.7 08/31/2011 1624   HCT 34.8 (L) 06/26/2017 0025   HCT 37.7 08/31/2011 1624   PLT 246 06/26/2017 0025   PLT 248 08/31/2011 1624   MCV 90.3 06/26/2017 0025   MCV 90 08/31/2011 1624   MCH 31.5 06/26/2017 0025   MCHC 34.9 06/26/2017 0025   RDW 11.9 06/26/2017 0025   RDW 12.6 08/31/2011 1624   LYMPHSABS 2.2 04/07/2017 1145   MONOABS 0.4 04/07/2017 1145   EOSABS 0.1 04/07/2017 1145   BASOSABS 0.0 04/07/2017 1145    No results found for: "POCLITH", "LITHIUM"   No results found for: "PHENYTOIN", "PHENOBARB", "VALPROATE", "CBMZ"   .res Assessment: Plan:    No contraindication to increasing Nortriptyline for epigastric pain.   There are no diagnoses linked to this encounter.   Please see After Visit Summary for patient specific instructions.  Future Appointments  Date Time Provider Department Center  12/15/2022  1:00 PM Stevphen Meuse, [facility] None  01/05/2023 12:00 PM Stevphen Meuse, [facility] CP-CP None  01/19/2023  1:00 PM Stevphen Meuse, [facility] None  02/02/2023  1:00 PM Stevphen Meuse, [facility] CP-CP None  02/16/2023 12:00 PM Stevphen Meuse, [facility] CP-CP None  03/02/2023 12:00 PM Stevphen Meuse, [facility] CP-CP None  06/29/2023 10:20 AM Baity, Salvadore Oxford, NP [facility] PEC    No orders of the defined types were placed in this  encounter.   -------------------------------

## 2022-12-15 ENCOUNTER — Ambulatory Visit: Payer: PRIVATE HEALTH INSURANCE | Admitting: Psychiatry

## 2022-12-15 DIAGNOSIS — F4310 Post-traumatic stress disorder, unspecified: Secondary | ICD-10-CM

## 2022-12-15 NOTE — Progress Notes (Signed)
[facility] Counselor/Therapist Progress Note  Patient ID: Peggy Ruiz, MRN: [id number],    Date: 12/15/2022  Time Spent: 67 minutes start time 1:05 PM end time 2:12 PM  Treatment Type: Individual Therapy  Reported Symptoms: pain issues, anxiety, sadness, health issues, panic, triggered responses, flashbacks, crying spells  Mental Status Exam:  Appearance:   Well Groomed     Behavior:  Appropriate  Motor:  Normal  Speech/Language:   Normal Rate  Affect:  Congruent  Mood:  anxious and sad  Thought process:  normal  Thought content:    WNL  Sensory/Perceptual disturbances:    pain  Orientation:  oriented to person, place, time/date, and situation  Attention:  Good  Concentration:  Good  Memory:  WNL  Fund of knowledge:   Good  Insight:    Good  Judgment:   Good  Impulse Control:  Good   Risk Assessment: Danger to Self:   thoughts of self harm 12/14/2022 reported being safe at session Self-injurious Behavior: No Danger to Others: No Duty to Warn:no Physical Aggression / Violence:No  Access to Firearms a concern: No  Gang Involvement:No   Subjective: Patient was present for session.  She shared she was doing some better after last session and was going to start journaling but than she started having huge pain issues in her neck and she has struggled doing anything she shared that her pain is a little better and is at a 7 currently. She shared that Father's Day went okay due to waitress being okay with her brother. She shared that she was trying to bake a cake for her dad and the mixer exploded in her hand. She shared everything went wrong with the cake and it was upsetting for her. Her parents are both having surgery and she is concerned about them. She went on to share there was a bad fight with she and her parents and they were at her house and left.  It triggered her not eating and being very upset. She shared that her hormones are not right and she is having bleeding  even though she is on her birth control pill.  She went on to share she is noticing a pattern of having panic attacks and being emotional and than she has bleeding for a few days wether it is time for her cycle ar not.  Patient did processing set on getting upset over the cake not working as she tried to make for her dad, suds level 10, negative cognition "I am a failure" felt anxiety and anger in her stomach.  Patient was able to reduce suds level to 3.  Through the processing she was able to recognize that the younger parts of her are still get very triggered and believes that she does not deserve good things.  She was able to recognize that she does deserve her husband and the kindness that he shows her.  Ways to affirm herself and remind herself of that truth were discussed in session.  Interventions: Solution-Oriented/Positive Psychology, Eye Movement Desensitization and Reprocessing (EMDR), and Insight-Oriented  Diagnosis:   ICD-10-CM   1. Posttraumatic stress disorder  F43.10       Plan: Patient is to use CBT and coping skills to decrease triggered responses.  Patient is to remind herself that she deserves good things.  Patient is to  focus on things that she can do and the things that she is thankful for even in hard situations.  She is  to start writing them down and reading in them as she needs to.  Patient is to work on affirming herself regularly that she is safe and thinking through what to do to help her feel safe.  Patient is to start looking into the possibility of a service dog.  Patient is to work on Psychologist, sport and exercise on her mirror to repeat regularly.  Patient is to take things in small steps so that she is able to accomplish tasks.  Patient is to take medication as directed.   Patient is to work on focusing on the things that she can accomplish.  Patient is to work on Dr. Rayfield Citizen leaf on the neuro cycle detox.  Patient is to continue working with providers on medical issues.   Long-term goal: Interact normally with friends and family without irrational fears or intrusive thoughts that control behavior.  Display a full range of emotions without experiencing loss of control Short-term goal: Identify and replace negative self talk and catastrophic sizing that is associated with past trauma and current stimuli triggers for anxiety    Stevphen Meuse, [facility]

## 2022-12-20 DIAGNOSIS — R87610 Atypical squamous cells of undetermined significance on cytologic smear of cervix (ASC-US): Secondary | ICD-10-CM | POA: Diagnosis not present

## 2022-12-20 DIAGNOSIS — D060 Carcinoma in situ of endocervix: Secondary | ICD-10-CM | POA: Diagnosis not present

## 2022-12-26 DIAGNOSIS — K219 Gastro-esophageal reflux disease without esophagitis: Secondary | ICD-10-CM | POA: Diagnosis not present

## 2022-12-26 DIAGNOSIS — K30 Functional dyspepsia: Secondary | ICD-10-CM | POA: Diagnosis not present

## 2022-12-26 DIAGNOSIS — K5904 Chronic idiopathic constipation: Secondary | ICD-10-CM | POA: Diagnosis not present

## 2022-12-26 DIAGNOSIS — K2960 Other gastritis without bleeding: Secondary | ICD-10-CM | POA: Diagnosis not present

## 2023-01-03 DIAGNOSIS — M5412 Radiculopathy, cervical region: Secondary | ICD-10-CM | POA: Diagnosis not present

## 2023-01-05 ENCOUNTER — Ambulatory Visit (INDEPENDENT_AMBULATORY_CARE_PROVIDER_SITE_OTHER): Payer: BC Managed Care – PPO | Admitting: Psychiatry

## 2023-01-05 DIAGNOSIS — F4310 Post-traumatic stress disorder, unspecified: Secondary | ICD-10-CM | POA: Diagnosis not present

## 2023-01-05 NOTE — Progress Notes (Unsigned)
      [facility] Counselor/Therapist Progress Note  Patient ID: Peggy Ruiz, MRN: [id number],    Date: 01/05/2023  Time Spent: 50 minutes start time 12:07 PM end time 12:57 PM  Treatment Type: Individual Therapy  Reported Symptoms: anxiety, rumination, health issues, triggered responses, sadness, panic, crying spells  Mental Status Exam:  Appearance:   Well Groomed     Behavior:  Appropriate  Motor:  Normal  Speech/Language:   Normal Rate  Affect:  Appropriate  Mood:  anxious  Thought process:  normal  Thought content:    WNL  Sensory/Perceptual disturbances:    WNL  Orientation:  oriented to person, place, time/date, and situation  Attention:  Good  Concentration:  Good  Memory:  WNL  Fund of knowledge:   Good  Insight:    Good  Judgment:   Good  Impulse Control:  Good   Risk Assessment: Danger to Self:  No Self-injurious Behavior: No Danger to Others: No Duty to Warn:no Physical Aggression / Violence:No  Access to Firearms a concern: No  Gang Involvement:No   Subjective: Patient was present for session. She shared her husband got a permeant job. She is under his insurance now and it is a good thing.   Patient is on her new GI medicine and it has helped. Her pain medication has been increased to issues with her neck and she will be going through PT for it.She is having her parents come to help her do chores due to the issues. Things are resolved with her parents. She had her annual exam with her OBGYN and her tests came back as precancer. She is saying there is at least 1 surgery coming for her. She shared that it has been hard hearing all the issues with her physically. She shared she is not sure how much 1 body can take.    Interventions: {PSY:[phone]}  Diagnosis:   ICD-10-CM   1. Posttraumatic stress disorder  F43.10       Plan: Patient is to use CBT and coping skills to decrease triggered responses.  Patient is to remind herself that she deserves good  things.  Patient is to  focus on things that she can do and the things that she is thankful for even in hard situations.  She is to start writing them down and reading in them as she needs to.  Patient is to work on affirming herself regularly that she is safe and thinking through what to do to help her feel safe.  Patient is to start looking into the possibility of a service dog.  Patient is to work on Psychologist, sport and exercise on her mirror to repeat regularly.  Patient is to take things in small steps so that she is able to accomplish tasks.  Patient is to take medication as directed.   Patient is to work on focusing on the things that she can accomplish.  Patient is to work on Dr. Rayfield Citizen leaf on the neuro cycle detox.  Patient is to continue working with providers on medical issues.  Long-term goal: Interact normally with friends and family without irrational fears or intrusive thoughts that control behavior.  Display a full range of emotions without experiencing loss of control Short-term goal: Identify and replace negative self talk and catastrophic sizing that is associated with past trauma and current stimuli triggers for anxiety  Stevphen Meuse, [facility]

## 2023-01-17 ENCOUNTER — Encounter: Payer: Self-pay | Admitting: Psychiatry

## 2023-01-17 DIAGNOSIS — R87619 Unspecified abnormal cytological findings in specimens from cervix uteri: Secondary | ICD-10-CM | POA: Diagnosis not present

## 2023-01-17 DIAGNOSIS — N879 Dysplasia of cervix uteri, unspecified: Secondary | ICD-10-CM | POA: Diagnosis not present

## 2023-01-19 ENCOUNTER — Encounter: Payer: Self-pay | Admitting: Psychiatry

## 2023-01-19 ENCOUNTER — Ambulatory Visit: Payer: BC Managed Care – PPO | Admitting: Psychiatry

## 2023-01-19 DIAGNOSIS — F4310 Post-traumatic stress disorder, unspecified: Secondary | ICD-10-CM

## 2023-01-19 NOTE — Progress Notes (Signed)
      [facility] Counselor/Therapist Progress Note  Patient ID: Peggy Ruiz, MRN: [id number],    Date: 01/19/2023  Time Spent: 52 minutes start time 1:05 PM end time 1:57 PM  Treatment Type: Individual Therapy  Reported Symptoms: anxiety, triggered responses, rumination, sleep issues,sadness grief  Mental Status Exam:  Appearance:   Well Groomed     Behavior:  Appropriate  Motor:  Normal  Speech/Language:   Normal Rate  Affect:  Appropriate  Mood:  anxious  Thought process:  normal  Thought content:    WNL  Sensory/Perceptual disturbances:    WNL  Orientation:  oriented to person, place, time/date, and situation  Attention:  Good  Concentration:  Good  Memory:  WNL  Fund of knowledge:   Good  Insight:    Good  Judgment:   Good  Impulse Control:  Good   Risk Assessment: Danger to Self:  No Self-injurious Behavior: No Danger to Others: No Duty to Warn:no Physical Aggression / Violence:No  Access to Firearms a concern: No  Gang Involvement:No   Subjective: Patient was present for session. She wanted her not to be secure and not in her mychart. Developed treatment plan and goals for treatment. Patient shared that she had gone to see the doctor concerning her possible cancer. She shared she will be having a cone biopsy and possible hysterectomy. Discussed how to talk herself through the next few weeks.  Patient also shared that her in-laws will be coming over for her husband's birthday and with feeling anxious already about all the medical issues she needed to figure out how to disconnect and keep things at a good place for her husband.  Discussed different options that she has and ways to set limits without being rude or inappropriate.  Patient reported feeling positive about the plan because it was going to allow her to have the space she needs to do what she needed to do for her husband.  Interventions: Cognitive Behavioral Therapy, Solution-Oriented/Positive Psychology,  and Insight-Oriented  Diagnosis:   ICD-10-CM   1. Posttraumatic stress disorder  F43.10       Plan:  Patient is to use CBT and coping skills to decrease triggered responses.  Patient is to follow plans from session to deal with her medical issues using CBT filters and to enjoy her husband's birthday party with his family.  Patient is to  focus on what she can control fix and change she is to start writing them down and reading in them as she needs to.  Patient is to work on affirming herself regularly that she is safe and thinking through what to do to help her feel safe.  Patient is to start looking into the possibility of a service dog.  Patient is to work on Psychologist, sport and exercise on her mirror to repeat regularly.  Patient is to take things in small steps so that she is able to accomplish tasks.  Patient is to take medication as directed.   Patient is to work on focusing on the things that she can accomplish.  Patient is to work on Dr. Rayfield Citizen leaf on the neuro cycle detox.  Patient is to continue working with providers on medical issues.   Stevphen Meuse, [facility]

## 2023-01-30 ENCOUNTER — Ambulatory Visit (INDEPENDENT_AMBULATORY_CARE_PROVIDER_SITE_OTHER): Payer: BC Managed Care – PPO | Admitting: Psychiatry

## 2023-01-30 ENCOUNTER — Encounter: Payer: Self-pay | Admitting: Psychiatry

## 2023-01-30 DIAGNOSIS — F411 Generalized anxiety disorder: Secondary | ICD-10-CM | POA: Diagnosis not present

## 2023-01-30 DIAGNOSIS — F3342 Major depressive disorder, recurrent, in full remission: Secondary | ICD-10-CM | POA: Diagnosis not present

## 2023-01-30 DIAGNOSIS — F4310 Post-traumatic stress disorder, unspecified: Secondary | ICD-10-CM | POA: Diagnosis not present

## 2023-01-30 MED ORDER — CLONAZEPAM 1 MG PO TABS
1.0000 mg | ORAL_TABLET | Freq: Every day | ORAL | 2 refills | Status: DC
Start: 2023-02-13 — End: 2023-04-06

## 2023-01-30 MED ORDER — BUPROPION HCL ER (XL) 150 MG PO TB24
150.0000 mg | ORAL_TABLET | Freq: Every day | ORAL | 1 refills | Status: DC
Start: 2023-01-30 — End: 2023-04-06

## 2023-01-30 MED ORDER — CLONAZEPAM 0.5 MG PO TABS
0.5000 mg | ORAL_TABLET | Freq: Two times a day (BID) | ORAL | 2 refills | Status: DC
Start: 2023-02-06 — End: 2023-02-06

## 2023-01-30 MED ORDER — BREXPIPRAZOLE 2 MG PO TABS
2.0000 mg | ORAL_TABLET | Freq: Every day | ORAL | 1 refills | Status: DC
Start: 2023-01-30 — End: 2023-06-29

## 2023-01-30 NOTE — Progress Notes (Unsigned)
Peggy Ruiz [id number] [birth year] 37 y.o.  Subjective:   Patient ID:  Peggy Ruiz is a 37 y.o. (DOB 06/16/1986) female.  Chief Complaint:  Chief Complaint  Patient presents with   Anxiety   Depression    HPI Peggy Ruiz presents to the office today for follow-up of anxiety, depression, and insomnia.   Had colposcopy in late June. It showed an adenoma in situ. She was referred to a gynecological oncologist. She has cervical cone biopsy scheduled in 3 weeks. She reports that providers have mentioned considering a hysterectomy. She reports increased anxiety in response to health issues over the last 8 years with 8 surgeries in 8 years. "I don't know how much more my physical body can take." She reports severe fatigue and has to rely on caffeine to help with her energy. She reports that she is crying often. She reports, "I'm just really depressed and then I get angry." She reports some panic attacks. She reports difficulty falling asleep and that it takes at least an hour to fall asleep. Typically stays asleep once she falls asleep. Appetite has improved. Concentration has been ok. Denies SI.   She reports that they are getting settled into their new house and have a new fence. Husband's commute is less.   Parents have been having health issues.   Klonopin 1 mg last filled 01/16/23 x 4. Klonopin 0.5 mg last filled 01/09/23 x4.  Past Psychiatric Medication Trials: Cymbalta- Effective for a period of time and then no longer as effective.  Prozac Lexapro Paxil- Initially helped and then did not seem to be as effective when used without Zyprexa Effexor Nortriptyline-prescribed by GI for gastroparesis Remeron-caused insomnia Buspar- Adverse reaction Abilify-involuntary movements Zyprexa-effective and took for about 14 years.  Caused excessive weight gain at times. Seroquel-Ineffective. Felt like a "zombie."  Latuda-effective. Sleeping "harder" on 80 mg. Lamictal Gabapentin- Cannot  tolerate doses higher than 800 mg BID Trileptal- Reports that she was unable to tolerate 900 mg QHS Belsomra-helpful with sleep quality but not sleep quantity. Klonopin Ativan Librium Clonidine- Effective Propranolol Trazodone Ambien Nuvigil Adderall Deplin     AIMS    Flowsheet Row Office Visit from 10/04/2022 in [facility] Office Visit from 03/03/2022 in [facility] Office Visit from 12/21/2021 in [facility] Office Visit from 02/26/2021 in [facility] Office Visit from 12/22/2020 in [facility]  AIMS Total Score 0 0 0 0 0        Review of Systems:  Review of Systems  Constitutional:  Positive for fatigue.  Musculoskeletal:  Positive for back pain. Negative for gait problem.  Psychiatric/Behavioral:         Please refer to HPI    Medications: I have reviewed the patient's current medications.  Current Outpatient Medications  Medication Sig Dispense Refill   brexpiprazole (REXULTI) 2 MG TABS tablet Take 1 tablet (2 mg total) by mouth daily. 90 tablet 1   buPROPion (WELLBUTRIN XL) 150 MG 24 hr tablet Take 1 tablet (150 mg total) by mouth daily. 90 tablet 1   clonazePAM (KLONOPIN) 0.5 MG tablet Take 1 tablet (0.5 mg total) by mouth 2 (two) times daily. Take 1 mg tab at bedtime 60 tablet 5   clonazePAM (KLONOPIN) 1 MG tablet Take 1 tablet (1 mg total) by mouth at bedtime. 30 tablet 5   cyclobenzaprine (FLEXERIL) 10 MG tablet Take 10 mg by mouth 3 (three)  times daily as needed for muscle spasms.     Drospirenone (SLYND) 4 MG TABS Take by mouth.     gabapentin (NEURONTIN) 800 MG tablet Take 800 mg by mouth 2 (two) times daily.     lansoprazole (PREVACID) 30 MG capsule Take 30 mg by mouth 2 (two) times daily before a meal.     levothyroxine (SYNTHROID, LEVOTHROID) 50 MCG tablet Take 50 mcg by mouth daily.     Lifitegrast (XIIDRA) 5 % SOLN  Apply to eye.     magnesium 30 MG tablet Take 30 mg by mouth 2 (two) times daily.     Multiple Vitamin (MULTIVITAMIN) tablet Take 1 tablet by mouth daily.     nortriptyline (PAMELOR) 10 MG capsule Take 1 capsule (10 mg total) by mouth at bedtime. 90 capsule 0   NUCYNTA 50 MG tablet Take 50 mg by mouth 3 (three) times daily as needed.     ondansetron (ZOFRAN-ODT) 8 MG disintegrating tablet Take 8 mg by mouth every 8 (eight) hours as needed for nausea.     ursodiol (ACTIGALL) 300 MG capsule Take 300 mg by mouth 2 (two) times daily.     No current facility-administered medications for this visit.    Medication Side Effects: None  Allergies:  Allergies  Allergen Reactions   Promethazine     Other reaction(s): Agitation   Ciprofloxacin Other (See Comments)    c-diff   Dilaudid [Hydromorphone]     sob   Epinephrine    Penicillins Hives    Has patient had a PCN reaction causing immediate rash, facial/tongue/throat swelling, SOB or lightheadedness with hypotension: Yes Has patient had a PCN reaction causing severe rash involving mucus membranes or skin necrosis: No Has patient had a PCN reaction that required hospitalization: No Has patient had a PCN reaction occurring within the last 10 years: Yes If all of the above answers are "NO", then may proceed with Cephalosporin use.    Toradol [Ketorolac Tromethamine] Nausea And Vomiting    Past Medical History:  Diagnosis Date   Anorexia    at 71   Anxiety    Attention deficit disorder (ADD)    Bone spur    Depression    Eczema    HPV (human papilloma virus) infection     Past Medical History, Surgical history, Social history, and Family history were reviewed and updated as appropriate.   Please see review of systems for further details on the patient's review from today.   Objective:   Physical Exam:  There were no vitals taken for this visit.  Physical Exam Constitutional:      General: She is not in acute  distress. Musculoskeletal:        General: No deformity.  Neurological:     Mental Status: She is alert and oriented to person, place, and time.     Coordination: Coordination normal.  Psychiatric:        Attention and Perception: Attention and perception normal. She does not perceive auditory or visual hallucinations.        Mood and Affect: Affect is not labile, blunt, angry or inappropriate.        Speech: Speech normal.        Behavior: Behavior normal.        Thought Content: Thought content normal. Thought content is not paranoid or delusional. Thought content does not include homicidal or suicidal ideation. Thought content does not include homicidal or suicidal plan.  Cognition and Memory: Cognition and memory normal.        Judgment: Judgment normal.     Comments: Insight intact Mood is appropriate to content and affect is congruent     Lab Review:     Component Value Date/Time   NA 139 06/26/2017 0025   NA 138 08/31/2011 1624   K 3.7 06/26/2017 0025   K 4.1 08/31/2011 1624   CL 105 06/26/2017 0025   CL 102 08/31/2011 1624   CO2 25 06/26/2017 0025   CO2 27 08/31/2011 1624   GLUCOSE 110 (H) 06/26/2017 0025   GLUCOSE 81 08/31/2011 1624   BUN 9 06/26/2017 0025   BUN 10 08/31/2011 1624   CREATININE 0.88 06/26/2017 0025   CREATININE 0.88 08/31/2011 1624   CALCIUM 9.1 06/26/2017 0025   CALCIUM 9.1 08/31/2011 1624   PROT 7.4 06/26/2017 0025   PROT 7.8 08/31/2011 1624   ALBUMIN 4.5 06/26/2017 0025   ALBUMIN 4.0 08/31/2011 1624   AST 19 06/26/2017 0025   AST 25 08/31/2011 1624   ALT 14 06/26/2017 0025   ALT 24 08/31/2011 1624   ALKPHOS 49 06/26/2017 0025   ALKPHOS 52 08/31/2011 1624   BILITOT 0.5 06/26/2017 0025   BILITOT 0.3 08/31/2011 1624   GFRNONAA >60 06/26/2017 0025   GFRNONAA >60 08/31/2011 1624   GFRAA >60 06/26/2017 0025   GFRAA >60 08/31/2011 1624       Component Value Date/Time   WBC 6.4 06/26/2017 0025   RBC 3.85 06/26/2017 0025   HGB 12.1  06/26/2017 0025   HGB 12.7 08/31/2011 1624   HCT 34.8 (L) 06/26/2017 0025   HCT 37.7 08/31/2011 1624   PLT 246 06/26/2017 0025   PLT 248 08/31/2011 1624   MCV 90.3 06/26/2017 0025   MCV 90 08/31/2011 1624   MCH 31.5 06/26/2017 0025   MCHC 34.9 06/26/2017 0025   RDW 11.9 06/26/2017 0025   RDW 12.6 08/31/2011 1624   LYMPHSABS 2.2 04/07/2017 1145   MONOABS 0.4 04/07/2017 1145   EOSABS 0.1 04/07/2017 1145   BASOSABS 0.0 04/07/2017 1145    No results found for: "POCLITH", "LITHIUM"   No results found for: "PHENYTOIN", "PHENOBARB", "VALPROATE", "CBMZ"   .res Assessment: Plan:   Will cancel RF's at previous pharmacies.    There are no diagnoses linked to this encounter.   Please see After Visit Summary for patient specific instructions.  Future Appointments  Date Time Provider Department Center  02/02/2023  1:00 PM Stevphen Meuse, [facility] CP-[facility] None  02/16/2023 12:00 PM Stevphen Meuse, [facility] None  03/02/2023 12:00 PM Stevphen Meuse, [facility] None  03/23/2023  1:00 PM Stevphen Meuse, [facility] CP-[facility] None  04/06/2023  1:00 PM Stevphen Meuse, [facility] CP-CP None  04/20/2023 12:00 PM Stevphen Meuse, [facility] CP-CP None  06/29/2023 10:20 AM Baity, Salvadore Oxford, NP [facility]    No orders of the defined types were placed in this encounter.   -------------------------------

## 2023-02-02 ENCOUNTER — Ambulatory Visit (INDEPENDENT_AMBULATORY_CARE_PROVIDER_SITE_OTHER): Payer: BC Managed Care – PPO | Admitting: Psychiatry

## 2023-02-02 ENCOUNTER — Telehealth: Payer: Self-pay | Admitting: Psychiatry

## 2023-02-02 DIAGNOSIS — F4310 Post-traumatic stress disorder, unspecified: Secondary | ICD-10-CM | POA: Diagnosis not present

## 2023-02-02 NOTE — Telephone Encounter (Signed)
[facility] called about Klonopin .  They already filled 8/12 for 3 per day( as instructions noted)  and so it's a #15 day supply only.  Will run out of this sooner. TO fix it you can: Rewrite Klonopin 0.5mg  as BID and then they can fill that again and the other Rx for 8/19 Klonopin 1.0 mg .

## 2023-02-02 NOTE — Telephone Encounter (Signed)
Please see message. I did cancel scripts as previously requested.

## 2023-02-02 NOTE — Progress Notes (Signed)
[facility] Counselor/Therapist Progress Note  Patient ID: Peggy Ruiz, MRN: [id number],    Date: 02/02/2023  Time Spent: 52 minutes start time 1:06 PM end time 1:58 PM  Treatment Type: Individual Therapy  Reported Symptoms: anxiety, health issues, triggered responses, irritability, fatigue  Mental Status Exam:  Appearance:   Casual     Behavior:  Appropriate  Motor:  Normal  Speech/Language:   Normal Rate  Affect:  Appropriate  Mood:  anxious  Thought process:  normal  Thought content:    WNL  Sensory/Perceptual disturbances:    WNL  Orientation:  oriented to person, place, time/date, and situation  Attention:  Good  Concentration:  Good  Memory:  WNL  Fund of knowledge:   Good  Insight:    Good  Judgment:   Good  Impulse Control:  Good   Risk Assessment: Danger to Self:  No Self-injurious Behavior: No Danger to Others: No Duty to Warn:no Physical Aggression / Violence:No  Access to Firearms a concern: No  Gang Involvement:No   Subjective: Patient was present for session. She shared she is going to have her surgery on 02/20/2023. She is trying to maintain perspective and be okay with it.  She shared her eating is improving. She went on to share she is having grief due to what is having to happen with her surgeries.  She also found out that her husband could be in [place] when she has her second surgery and that is triggering because when she had complications with her fusion in the past he was in [place] and had to fly back.  Patient did not process since that has been flying to [place] suds level 9, negative cognition "I am not safe" felt fear and sadness in her chest.  Patient was able to reduce suds level to 6.  She was able to recognize that things are different and that she is moving in a good direction.  Patient went on to share some of her feelings that she is not moving fast enough treatment.  Discussed her situational issues including difficulty with hormone pills  which is impacting her mood medical issues that are keeping her from eating well, as well as the newest precancer cells that are having to be addressed.  Patient was given handouts on cognitive distortions and how to and twist them and reminded she has to stay focused on the chores.  Interventions: Cognitive Behavioral Therapy, Eye Movement Desensitization and Reprocessing (EMDR), Insight-Oriented, and BSP  Diagnosis:   ICD-10-CM   1. Posttraumatic stress disorder  F43.10       Plan:  Patient is to use CBT and coping skills to decrease triggered responses.  Patient is to follow plans from session to address her cognitive distortions.  Patient is to  focus on what she can control fix and change she is to start writing them down and reading in them as she needs to.  Patient is to work on affirming herself regularly that she is safe and thinking through what to do to help her feel safe.  Patient is to start looking into the possibility of a service dog.  Patient is to work on Psychologist, sport and exercise on her mirror to repeat regularly.  Patient is to take things in small steps so that she is able to accomplish tasks.  Patient is to take medication as directed.   Patient is to work on focusing on the things that she can accomplish.  Patient is to work  on Dr. De Burrs on the neuro cycle detox.  Patient is to continue working with providers on medical issues   Stevphen Meuse, [facility]

## 2023-02-03 ENCOUNTER — Telehealth: Payer: Self-pay

## 2023-02-06 ENCOUNTER — Other Ambulatory Visit: Payer: Self-pay

## 2023-02-06 DIAGNOSIS — F411 Generalized anxiety disorder: Secondary | ICD-10-CM

## 2023-02-06 DIAGNOSIS — F4310 Post-traumatic stress disorder, unspecified: Secondary | ICD-10-CM

## 2023-02-06 NOTE — Telephone Encounter (Signed)
Pended.

## 2023-02-06 NOTE — Telephone Encounter (Signed)
It looks like you have already taken care of this. If not let me know and I will pend today.

## 2023-02-06 NOTE — Telephone Encounter (Signed)
It looks like when the pharmacy called they may have not been aware that she has a separate script for Klonopin 1 mg at bedtime and that she takes Klonopin 0.5 mg twice daily only, so #60 is a 30-day supply. She recently transferred to this pharmacy and the 0.5 mg and 1 mg scripts have had different fill dates.

## 2023-02-07 ENCOUNTER — Ambulatory Visit: Payer: PRIVATE HEALTH INSURANCE | Admitting: Psychiatry

## 2023-02-07 MED ORDER — CLONAZEPAM 0.5 MG PO TABS
0.5000 mg | ORAL_TABLET | Freq: Two times a day (BID) | ORAL | 2 refills | Status: AC
Start: 2023-02-07 — End: ?

## 2023-02-16 ENCOUNTER — Ambulatory Visit: Payer: BC Managed Care – PPO | Admitting: Psychiatry

## 2023-02-16 DIAGNOSIS — F4310 Post-traumatic stress disorder, unspecified: Secondary | ICD-10-CM | POA: Diagnosis not present

## 2023-02-16 NOTE — Progress Notes (Signed)
      [facility] Counselor/Therapist Progress Note  Patient ID: Peggy Ruiz, MRN: [id number],    Date: 02/16/2023  Time Spent: 51 minutes start time 12:05 PM end time 12:56 PM  Treatment Type: Individual Therapy  Reported Symptoms: anxiety, pain, health issues, triggered responses, sadness, panic, irritability  Mental Status Exam:  Appearance:   Well Groomed     Behavior:  Appropriate  Motor:  Normal  Speech/Language:   Normal Rate  Affect:  Appropriate  Mood:  normal  Thought process:  normal  Thought content:    WNL  Sensory/Perceptual disturbances:    pain  Orientation:  oriented to person, place, time/date, and situation  Attention:  Good  Concentration:  Good  Memory:  WNL  Fund of knowledge:   Good  Insight:    Good  Judgment:   Good  Impulse Control:  Good   Risk Assessment: Danger to Self:  No Self-injurious Behavior: No Danger to Others: No Duty to Warn:no Physical Aggression / Violence:No  Access to Firearms a concern: No  Gang Involvement:No   Subjective: Patient was present for session. She shared that her mother stayed with them for 9 days and she had to take care of her and it has been very difficult for patient and that has created more pain for her. Her friend just died at 65 of uterine cancer and she is preparing for her cone biopsy in for 4 days.  Allowed patient time to discuss how she is feeling about her friend and what she went through with her mom.  Patient stated it has been very triggering her since she is having a biopsy in a few days to discuss the situation.  Encouraged patient to think through ways to calm herself and be able to move forward with her upcoming test.  Patient was encouraged to focus on the positives and recognize how her situation is different from her friends.  She reported that that helped her to calm some and she would stay focused on the truth and recognizing she has to focus on what she cannot control fix and  change.  Interventions: Cognitive Behavioral Therapy and Solution-Oriented/Positive Psychology  Diagnosis:   ICD-10-CM   1. PTSD (post-traumatic stress disorder)  F43.10       Plan:  Patient is to use CBT and coping skills to decrease triggered responses.  Patient is to follow plans from session to address her cognitive distortions.  Patient is to  focus on what she can control fix and change she is to start writing them down and reading in them as she needs to.  Patient is to work on affirming herself regularly that she is safe and thinking through what to do to help her feel safe.  Patient is to start looking into the possibility of a service dog.  Patient is to work on Psychologist, sport and exercise on her mirror to repeat regularly.  Patient is to take things in small steps so that she is able to accomplish tasks.  Patient is to take medication as directed.   Patient is to work on focusing on the things that she can accomplish.  Patient is to work on Dr. Rayfield Citizen leaf on the neuro cycle detox.  Patient is to continue working with providers on medical issues   Stevphen Meuse, [facility]

## 2023-02-17 DIAGNOSIS — E039 Hypothyroidism, unspecified: Secondary | ICD-10-CM | POA: Diagnosis not present

## 2023-02-20 DIAGNOSIS — F32A Depression, unspecified: Secondary | ICD-10-CM | POA: Diagnosis not present

## 2023-02-20 DIAGNOSIS — K219 Gastro-esophageal reflux disease without esophagitis: Secondary | ICD-10-CM | POA: Diagnosis not present

## 2023-02-20 DIAGNOSIS — Z880 Allergy status to penicillin: Secondary | ICD-10-CM | POA: Diagnosis not present

## 2023-02-20 DIAGNOSIS — Z888 Allergy status to other drugs, medicaments and biological substances status: Secondary | ICD-10-CM | POA: Diagnosis not present

## 2023-02-20 DIAGNOSIS — F419 Anxiety disorder, unspecified: Secondary | ICD-10-CM | POA: Diagnosis not present

## 2023-02-20 DIAGNOSIS — D060 Carcinoma in situ of endocervix: Secondary | ICD-10-CM | POA: Diagnosis not present

## 2023-02-20 DIAGNOSIS — D069 Carcinoma in situ of cervix, unspecified: Secondary | ICD-10-CM | POA: Diagnosis not present

## 2023-02-20 DIAGNOSIS — G4700 Insomnia, unspecified: Secondary | ICD-10-CM | POA: Diagnosis not present

## 2023-02-20 DIAGNOSIS — E039 Hypothyroidism, unspecified: Secondary | ICD-10-CM | POA: Diagnosis not present

## 2023-02-20 DIAGNOSIS — Z885 Allergy status to narcotic agent status: Secondary | ICD-10-CM | POA: Diagnosis not present

## 2023-02-28 DIAGNOSIS — E039 Hypothyroidism, unspecified: Secondary | ICD-10-CM | POA: Diagnosis not present

## 2023-03-02 ENCOUNTER — Ambulatory Visit: Payer: BC Managed Care – PPO | Admitting: Psychiatry

## 2023-03-02 DIAGNOSIS — F4310 Post-traumatic stress disorder, unspecified: Secondary | ICD-10-CM | POA: Diagnosis not present

## 2023-03-02 NOTE — Progress Notes (Signed)
[facility] Counselor/Therapist Progress Note  Patient ID: Peggy Ruiz, MRN: [id number],    Date: 03/02/2023  Time Spent: 51 minutes start time 12:03 PM end time 12:54 PM  Treatment Type: Individual Therapy  Reported Symptoms: anxiety, health issues, triggered responses, Fatigue  Mental Status Exam:  Appearance:   Casual     Behavior:  Appropriate  Motor:  Normal  Speech/Language:   Normal Rate  Affect:  Appropriate  Mood:  normal  Thought process:  normal  Thought content:    WNL  Sensory/Perceptual disturbances:    WNL  Orientation:  oriented to person, place, time/date, and situation  Attention:  Good  Concentration:  Good  Memory:  WNL  Fund of knowledge:   Good  Insight:    Good  Judgment:   Good  Impulse Control:  Good   Risk Assessment: Danger to Self:  No Self-injurious Behavior: No Danger to Others: No Duty to Warn:no Physical Aggression / Violence:No  Access to Firearms a concern: No  Gang Involvement:No   Subjective: Patient was present for session. She shared that surgery went okay.  She shared that the biopsy did find there are questionable cells and there will be a hysterectomy. She shared she is having anxiety due to not knowing exactly what is going on with her still. She knows that all the surgeries are taking lots out of her. She went on to share that she knows it will have to happen before the end of the year. She shared her parents are having 40th anniversary on November 9 and her anniversary is in December so she doesn't want to miss anything.  Had patient think through what she can control and what she cannot control.  Discussed the fact that she has to have the surgery but when she has the surgery can be worked around what she wants to do for her parents.  Patient was able to think through the dates and times that she cannot communicate with her surgeons scheduler on when she needs the surgery to be or not to be.  Patient was also encouraged to  reframe the whole situation to looking at the things that she can do rather than focusing on the fear and anxiety about the situation.  Patient was able to think through things that she still needs to plan for her mother's and father's anniversary party, things that she can look forward to for she and her husband.  And is going to start investigating the potential of working with some sort of volunteer program that allows her to have like a big sister big brother role with an adolescent who needs more support.  Patient was also encouraged to remember her coping skills and the importance of thinking about the facts rather than allowing her brain to go in negative directions.  Discussed cognitive distortions and continuing to work on dealing with them appropriately..  Interventions: Cognitive Behavioral Therapy and Solution-Oriented/Positive Psychology  Diagnosis:   ICD-10-CM   1. PTSD (post-traumatic stress disorder)  F43.10       Plan:  Patient is to use CBT and coping skills to decrease triggered responses.  Patient is to follow plans from session to keep her brain engaged in positive activities and things that she can look forward to potentially.  Patient is to  focus on what she can control fix and change she is to start writing them down and reading in them as she needs to.  Patient is to work  on affirming herself regularly that she is safe and thinking through what to do to help her feel safe.  Patient is to start looking into the possibility of a service dog.  Patient is to work on Psychologist, sport and exercise on her mirror to repeat regularly.  Patient is to take things in small steps so that she is able to accomplish tasks.  Patient is to take medication as directed.   Patient is to work on focusing on the things that she can accomplish.  Patient is to work on Dr. Rayfield Citizen leaf on the neuro cycle detox.  Patient is to continue working with providers on medical issues   Stevphen Meuse,  [facility]

## 2023-03-08 DIAGNOSIS — D069 Carcinoma in situ of cervix, unspecified: Secondary | ICD-10-CM | POA: Diagnosis not present

## 2023-03-21 ENCOUNTER — Telehealth: Payer: Self-pay

## 2023-03-21 NOTE — Telephone Encounter (Signed)
I can see in the [facility] system in the media tab that she had an MRI lumbar spine at [facility] (the document is labeled 08/25/22). However, I am unable to load the report (their system freezes every time I try to open it). We will need a CD of the MRI from [facility] and a copy of the report. She is on Stacy's schedule 03/28/23. I sent a powershare request for her older images. Thanks!

## 2023-03-22 ENCOUNTER — Inpatient Hospital Stay
Admission: RE | Admit: 2023-03-22 | Discharge: 2023-03-22 | Disposition: A | Discharge status: Home / Self Care | Payer: Self-pay | Source: Ambulatory Visit | Attending: Orthopedic Surgery | Admitting: Orthopedic Surgery

## 2023-03-22 ENCOUNTER — Other Ambulatory Visit: Payer: Self-pay

## 2023-03-22 DIAGNOSIS — Z049 Encounter for examination and observation for unspecified reason: Secondary | ICD-10-CM

## 2023-03-22 NOTE — Telephone Encounter (Signed)
She said it was May 2023. She will bring that to the office. She didn't think it was important because I asked if she had a MRI in the past year.

## 2023-03-23 ENCOUNTER — Ambulatory Visit (INDEPENDENT_AMBULATORY_CARE_PROVIDER_SITE_OTHER): Payer: BC Managed Care – PPO | Admitting: Psychiatry

## 2023-03-23 DIAGNOSIS — F4310 Post-traumatic stress disorder, unspecified: Secondary | ICD-10-CM

## 2023-03-23 NOTE — Telephone Encounter (Signed)
She has both the CD and report. She knows to bring them to the appt or drop off sooner.

## 2023-03-23 NOTE — Progress Notes (Signed)
[facility] Counselor/Therapist Progress Note  Patient ID: Peggy Ruiz, MRN: [id number],    Date: 03/23/2023  Time Spent: 58 minutes start time 1:07 PM end time 2:05 PM  Treatment Type: Individual Therapy  Reported Symptoms: triggered responses, health issues, anxiety, sadness, panic, flashbacks  Mental Status Exam:  Appearance:   Well Groomed     Behavior:  Appropriate  Motor:  Normal  Speech/Language:   Normal Rate  Affect:  Appropriate  Mood:  anxious and sad  Thought process:  normal  Thought content:    WNL  Sensory/Perceptual disturbances:    WNL  Orientation:  oriented to person, place, time/date, and situation  Attention:  Good  Concentration:  Good  Memory:  WNL  Fund of knowledge:   Good  Insight:    Good  Judgment:   Good  Impulse Control:  Good   Risk Assessment: Danger to Self:  No Self-injurious Behavior: No Danger to Others: No Duty to Warn:no Physical Aggression / Violence:No  Access to Firearms a concern: No  Gang Involvement:No   Subjective: Patient was present for session. She shared that they found cancerous cells so she will be having a hysterectomy and have 2 lymph nodes removed. She shared she knows she has had to have an emotional disconnect from the problem.  She went on to share she went off her birth control pills.  She went on to share that on her Birthday her dad called and there is another incident with her parents and it ruined the night.  She went on to share that it all revolved around her brother who had gotten out of control again which is the pattern since childhood.  Allowed patient time to discuss what happened and what it surfaced for her.  Encouraged her to think through how to take care of herself and focus on what she needs during those moments.  Acknowledged that being in the middle of her parents has been where she is felt her entire life and that is not a good thing even though she loves her parents very much.  Patient was  encouraged to focus on her healing and to allow her parents to focus on her healing because that may help change the dynamic with in their situation as well.  Patient was able to share 1 positive and that things are going much better with she and her husband and that he has been able to help her get through the sadness of her diagnosis.  Patient was encouraged just to take things 1 step at a time.  Encouraged her to talk with providers about what she can do to try and prevent some of the allergic reactions that happened after her last surgery and to help her feel there are some things she can do proactively rather than just feeling stuck with the outcomes.  Reminded her to focus on her thoughts and to keep listen to podcast to help her do that.  Interventions: Cognitive Behavioral Therapy and Solution-Oriented/Positive Psychology  Diagnosis:   ICD-10-CM   1. PTSD (post-traumatic stress disorder)  F43.10       Plan:  Patient is to use CBT and coping skills to decrease triggered responses.  Patient is to follow plans from session to take things 1 step at a time especially with her relationship with her parents and to try and talk with providers about things that she can do proactively to potentially prevent some of the allergic reactions that occurred after  her last surgery.  Patient is to  focus on what she can control fix and change she is to start writing them down and reading in them as she needs to.  Patient is to work on affirming herself regularly that she is safe and thinking through what to do to help her feel safe.  Patient is to start looking into the possibility of a service dog.  Patient is to work on Psychologist, sport and exercise on her mirror to repeat regularly.  Patient is to take things in small steps so that she is able to accomplish tasks.  Patient is to take medication as directed.   Patient is to work on focusing on the things that she can accomplish.  Patient is to work on Dr. Rayfield Citizen leaf on  the neuro cycle detox.  Patient is to continue working with providers on medical issues     Stevphen Meuse, [facility]

## 2023-03-24 NOTE — Progress Notes (Addendum)
Referring Physician:  No referring provider defined for this encounter.  Primary Physician:  Pcp, No  History of Present Illness: 03/28/2023 [name] has a history of cervical CA, PTSD, GERD, lyme disease, migraines, pelvic pain, hypothyroidism, FM, and chronic back pain.   Getting ready to be scheduled for hysterectomy for cervical CA.   History of lumbar fusion L5-S1. 11/24/2016 Dr. Estill Ruiz. This helped her right leg pain but she continued with chronic back pain.   She has constant LBP with intermittent right anterior/latearal leg pain to her foot that has been worse in last 4 weeks. She has numbness in right leg with walking. Pain is sharp and feels electrical. No left leg pain. Pain is worse with prolonged sitting/standing/walking. She is using a cane.   She was prescribed SUBUTEX from Dr. Ethelene Ruiz- has not started it yet. Was on nycenta and insurance stopped covering it.   Bowel/Bladder Dysfunction: history of chronic urinary urgency (has seen urology), no bowel incontinence.   Conservative measures:  Physical therapy: did in last year at [facility]  Multimodal medical therapy including regular antiinflammatories: flexeril, neurontin, subutex, nycenta Injections:  Had ESIs at [facility] in [place]- last was in August  Past Surgery:  Lumbar fusion L5-S1 on 11/24/16 She's had multiple other lumbar surgeries (2 discectomies?)  The symptoms are causing a significant impact on the patient's life.   Review of Systems:  A 10 point review of systems is negative, except for the pertinent positives and negatives detailed in the HPI.  Past Medical History: Past Medical History:  Diagnosis Date   Anorexia    at 64   Anxiety    Attention deficit disorder (ADD)    Bone spur    Depression    Eczema    HPV (human papilloma virus) infection     Past Surgical History: Past Surgical History:  Procedure Laterality Date   EYE SURGERY     for lazy eye   HERNIA REPAIR       Allergies: Allergies as of 03/28/2023 - Review Complete 01/30/2023  Allergen Reaction Noted   Benzyl alcohol Hives and Other (See Comments) 06/25/2015   Benzoin Hives and Other (See Comments) 08/11/2022   Promethazine  02/11/2016   Scopolamine Rash 08/11/2022   Ciprofloxacin Other (See Comments) 02/07/2012   Dilaudid [hydromorphone]  04/07/2017   Epinephrine  02/25/2019   Penicillins Hives 02/07/2012   Toradol [ketorolac tromethamine] Nausea And Vomiting 04/07/2017    Medications: Outpatient Encounter Medications as of 03/28/2023  Medication Sig   brexpiprazole (REXULTI) 2 MG TABS tablet Take 1 tablet (2 mg total) by mouth daily.   buprenorphine (SUBUTEX) 2 MG SUBL SL tablet Place 2 mg under the tongue daily.   buPROPion (WELLBUTRIN XL) 150 MG 24 hr tablet Take 1 tablet (150 mg total) by mouth daily.   clonazePAM (KLONOPIN) 0.5 MG tablet Take 1 tablet (0.5 mg total) by mouth 2 (two) times daily. Take 1 mg tab at bedtime   clonazePAM (KLONOPIN) 1 MG tablet Take 1 tablet (1 mg total) by mouth at bedtime.   cyclobenzaprine (FLEXERIL) 10 MG tablet Take 10 mg by mouth 3 (three) times daily as needed for muscle spasms.   gabapentin (NEURONTIN) 800 MG tablet Take 800 mg by mouth 2 (two) times daily.   lansoprazole (PREVACID) 30 MG capsule Take 30 mg by mouth 2 (two) times daily before a meal.   levothyroxine (SYNTHROID, LEVOTHROID) 50 MCG tablet Take 50 mcg by mouth daily.   magnesium 30  MG tablet Take 30 mg by mouth 2 (two) times daily.   Multiple Vitamin (MULTIVITAMIN) tablet Take 1 tablet by mouth daily.   nortriptyline (PAMELOR) 10 MG capsule Take 1 capsule (10 mg total) by mouth at bedtime.   NUCYNTA 50 MG tablet Take 50 mg by mouth 3 (three) times daily as needed.   ondansetron (ZOFRAN) 4 MG tablet Take 4 mg by mouth every 8 (eight) hours as needed.   ursodiol (ACTIGALL) 300 MG capsule Take 300 mg by mouth 2 (two) times daily.   [DISCONTINUED] ondansetron (ZOFRAN-ODT) 8 MG  disintegrating tablet Take 8 mg by mouth every 8 (eight) hours as needed for nausea.   loratadine (CLARITIN) 10 MG tablet Take by mouth daily as needed.   [DISCONTINUED] Drospirenone (SLYND) 4 MG TABS Take by mouth.   [DISCONTINUED] Lifitegrast (XIIDRA) 5 % SOLN Apply to eye.   No facility-administered encounter medications on file as of 03/28/2023.    Social History: Social History   Tobacco Use   Smoking status: Never   Smokeless tobacco: Never  Vaping Use   Vaping status: Never Used  Substance Use Topics   Alcohol use: Yes    Comment: occasion   Drug use: No    Family Medical History: Family History  Problem Relation Age of Onset   Fibromyalgia Mother    Anxiety disorder Mother    Depression Mother    Depression Father    Anxiety disorder Father     Physical Examination: Vitals:   03/28/23 0902  BP: 98/68    General: Patient is well developed, well nourished, calm, collected, and in no apparent distress. Attention to examination is appropriate.  Respiratory: Patient is breathing without any difficulty.   NEUROLOGICAL:     Awake, alert, oriented to person, place, and time.  Speech is clear and fluent. Fund of knowledge is appropriate.   She is tearful during the interview.   Cranial Nerves: Pupils equal round and reactive to light.  Facial tone is symmetric.    She has well healed lumbar incision.   She has mild lower lumbar tenderness that is worse on right.   No abnormal lesions on exposed skin.   Strength: Side Biceps Triceps Deltoid Interossei Grip Wrist Ext. Wrist Flex.  R 5 5 5 5 5 5 5   L 5 5 5 5 5 5 5    Side Iliopsoas Quads Hamstring PF DF EHL  R 5 5 5 4 4 4   L 5 5 5 5 5 5    She can stand on toes on left side. She has difficulty standing on toes on right side, but can still get heel up off the ground.   She has some weakness in right foot as above. She has pain with strength testing overall, but no other gross weakness noted.   Reflexes  are 1+ and symmetric at the biceps, brachioradialis, patella and achilles.   Hoffman's is absent.  Clonus is not present.   Bilateral upper and lower extremity sensation is intact to light touch, but diminished in right lower extremity.   She ambulates slowly with a cane.   Medical Decision Making  Imaging: Lumbar xrays dated 08/25/22:  Instrumented fusion L5-S1 with slight retrolisthesis L3-L4 and L4-L5.   No report available for above xrays.   MRI of lumbar spine dated 11/23/21:  Impression:  Postsurgical changes at L5-S1 without spinal canal stenosis with postsurgical distortion of descending bilateral S1 nerve roots. Moderate right neural foraminal narrowing, unchanges.  Mild-moderate right and  mild left L4-L5 neural foraminal narrowing, progressed on the right.  Mild left L3-L4 neural foraminal narrowing, progressed.  Mild dextroconvex lumbar scoliosis apex centered at L3.    MRI report scanned under media.   I have personally reviewed the images and agree with the above interpretation.  Assessment and Plan: Ms. Papadopoulos is a pleasant 37 y.o. female has history of lumbar surgery x 3 with fusion L5-S1 in 2018. Right leg pain improved after surgery, but she continued with LBP.   Now with over a year of constant LBP with intermittent right anterior/latearal leg pain to her foot that has been worse in last 4 weeks. She has numbness in right leg with walking. Pain is sharp and feels electrical. No left leg pain.   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 She has some weakness in right foot with DF/PF/EHL.   Treatment options discussed with patient and following plan made:   - MRI of lumbar spine to further evaluate right lumbar radiculopathy.  - Continue current medications from Dr. Ethelene Ruiz.  - She is scheduled for hysterectomy on 04/14/23.  - Will see how she is feeling after surgery, can do phone visit or in person visit to  review her lumbar MRI.   BP was low. She states it always runs low. She does have some dizziness when she stands up from bending down, but feels well right now. She is trying to get a new PCP- does not have appt until January. Will give her contact information for Dr. Margarita Mail.   I spent a total of 35 minutes in face-to-face and non-face-to-face activities related to this patient's care today including review of outside records, review of imaging, review of symptoms, physical exam, discussion of differential diagnosis, discussion of treatment options, and documentation.   Thank you for involving me in the care of this patient.   ADDENDUM 04/03/23:  Records scanned under media.   She had revision L5-S1, L4-L5 discectomy and TLIF L5-S1 on 11/24/16.   CT on 01/20/21 looked good. EMG on 01/20/21 was normal. MRI on 01/20/21 showed facet hypertrophy L4-L5 with mild bilateral foraminal stenosis.   She had right L4 TF ESI on 12/23/21.   Drake Leach PA-C Dept. of Neurosurgery

## 2023-03-27 ENCOUNTER — Telehealth: Payer: Self-pay | Admitting: Psychiatry

## 2023-03-27 DIAGNOSIS — F411 Generalized anxiety disorder: Secondary | ICD-10-CM

## 2023-03-27 DIAGNOSIS — F4310 Post-traumatic stress disorder, unspecified: Secondary | ICD-10-CM

## 2023-03-27 NOTE — Telephone Encounter (Signed)
See message from pharmacy. Two separate scripts were written, but the one below is showing both doses.  Take 1 tablet (0.5 mg total) by mouth 2 (two) times daily. Take 1 mg tab at bedtime

## 2023-03-27 NOTE — Telephone Encounter (Signed)
Pt called at 11:45a.  She stated the pharmacy will not fill her individual scripts for Klonopin that are for 0.5mg  and 1.0 mg.  Her insurance will pay but the pharmacy told her she needs to get a script for the full 30 days of her total daily dosage of 2.0mg .  So she need 120 pills of the 0.5mg  or 60 pills of the 1.0mg .  [facility] [address] [place] - [address] [place] [address] Phone: [phone]  Fax: [phone]    Next appt 10/10

## 2023-03-28 ENCOUNTER — Ambulatory Visit: Payer: BC Managed Care – PPO | Admitting: Orthopedic Surgery

## 2023-03-28 ENCOUNTER — Telehealth: Payer: Self-pay | Admitting: Psychiatry

## 2023-03-28 ENCOUNTER — Encounter: Payer: Self-pay | Admitting: Orthopedic Surgery

## 2023-03-28 ENCOUNTER — Other Ambulatory Visit: Payer: Self-pay

## 2023-03-28 ENCOUNTER — Inpatient Hospital Stay
Admission: RE | Admit: 2023-03-28 | Discharge: 2023-03-28 | Disposition: A | Discharge status: Home / Self Care | Payer: Self-pay | Source: Ambulatory Visit | Attending: Orthopedic Surgery | Admitting: Orthopedic Surgery

## 2023-03-28 VITALS — BP 98/68 | Ht 68.0 in | Wt 137.0 lb

## 2023-03-28 DIAGNOSIS — M5416 Radiculopathy, lumbar region: Secondary | ICD-10-CM | POA: Diagnosis not present

## 2023-03-28 DIAGNOSIS — M4316 Spondylolisthesis, lumbar region: Secondary | ICD-10-CM

## 2023-03-28 DIAGNOSIS — M48061 Spinal stenosis, lumbar region without neurogenic claudication: Secondary | ICD-10-CM | POA: Diagnosis not present

## 2023-03-28 DIAGNOSIS — Z981 Arthrodesis status: Secondary | ICD-10-CM | POA: Diagnosis not present

## 2023-03-28 DIAGNOSIS — M47816 Spondylosis without myelopathy or radiculopathy, lumbar region: Secondary | ICD-10-CM

## 2023-03-28 DIAGNOSIS — Z049 Encounter for examination and observation for unspecified reason: Secondary | ICD-10-CM

## 2023-03-28 MED ORDER — CLONAZEPAM 0.5 MG PO TABS: 0.5000 mg | ORAL_TABLET | Freq: Two times a day (BID) | ORAL | 2 refills | Status: DC

## 2023-03-28 NOTE — Telephone Encounter (Signed)
New Rx had been sent this afternoon. Called the pharmacy and they said it had all been worked out. Notified patient.

## 2023-03-28 NOTE — Telephone Encounter (Signed)
[facility] pharmacy lvm at 11 requesting assistance for one of Alexina's prescriptions. Ph: [phone]

## 2023-03-28 NOTE — Patient Instructions (Addendum)
It was so nice to see you today. Thank you so much for coming in.    I want to get an updated MRI of your lumbar spine to look into things further. We will get this approved through your insurance and [facility] will call you to schedule the appointment.   [facility] (building with the white pillars) is located off of [address]. The address is [address], [place], [place] [address].   After you have the MRI, it takes 10-14 days for me to get the results back. Once I have them, we will call you to schedule a follow up visit with me to review them. If you are feeling well, we can do an in person visit. If not, we can do a phone visit.  We will work on getting your records from [address] and [place].   Try calling Dr. Margarita Mail at [facility]. She is my PCP. Her number is [phone].   Good luck with your surgery on 10/18. I will be sending you lots of healing thoughts.   Please do not hesitate to call if you have any questions or concerns. You can also message me in MyChart.   If you have not heard back about the MRI in the next week, please call the office so we can help you get it scheduled.   Drake Leach PA-C [phone]

## 2023-03-28 NOTE — Telephone Encounter (Signed)
Received message yesterday. There is an issue with her clonazepam Rx. Sent message to Bud. Tried to call pharmacy today and it just kept ringing.

## 2023-03-28 NOTE — Telephone Encounter (Signed)
Order clarification sent

## 2023-03-28 NOTE — Telephone Encounter (Signed)
Peggy Ruiz called and LM at 2:03 that the script sent to [facility] is still wrong.  She doesn't understand what the problem is. She is very frustrated that she can't get her medication at the right doses being requested.  She is requesting a call back to get the problem solved.  Next appt 10/10

## 2023-03-28 NOTE — Telephone Encounter (Signed)
Peggy Ruiz sent clarification today.

## 2023-04-01 ENCOUNTER — Ambulatory Visit
Admission: RE | Admit: 2023-04-01 | Discharge: 2023-04-01 | Disposition: A | Discharge status: Home / Self Care | Payer: BC Managed Care – PPO | Source: Ambulatory Visit | Attending: Orthopedic Surgery | Admitting: Orthopedic Surgery

## 2023-04-01 DIAGNOSIS — M5126 Other intervertebral disc displacement, lumbar region: Secondary | ICD-10-CM | POA: Diagnosis not present

## 2023-04-01 DIAGNOSIS — M5136 Other intervertebral disc degeneration, lumbar region with discogenic back pain only: Secondary | ICD-10-CM | POA: Diagnosis not present

## 2023-04-01 DIAGNOSIS — M5416 Radiculopathy, lumbar region: Secondary | ICD-10-CM | POA: Diagnosis not present

## 2023-04-01 DIAGNOSIS — M438X6 Other specified deforming dorsopathies, lumbar region: Secondary | ICD-10-CM | POA: Diagnosis not present

## 2023-04-01 DIAGNOSIS — M47816 Spondylosis without myelopathy or radiculopathy, lumbar region: Secondary | ICD-10-CM | POA: Insufficient documentation

## 2023-04-01 DIAGNOSIS — Z981 Arthrodesis status: Secondary | ICD-10-CM | POA: Diagnosis not present

## 2023-04-01 DIAGNOSIS — M48061 Spinal stenosis, lumbar region without neurogenic claudication: Secondary | ICD-10-CM | POA: Diagnosis not present

## 2023-04-03 DIAGNOSIS — D069 Carcinoma in situ of cervix, unspecified: Secondary | ICD-10-CM | POA: Diagnosis not present

## 2023-04-03 DIAGNOSIS — K5904 Chronic idiopathic constipation: Secondary | ICD-10-CM | POA: Diagnosis not present

## 2023-04-03 DIAGNOSIS — K30 Functional dyspepsia: Secondary | ICD-10-CM | POA: Diagnosis not present

## 2023-04-03 DIAGNOSIS — K219 Gastro-esophageal reflux disease without esophagitis: Secondary | ICD-10-CM | POA: Diagnosis not present

## 2023-04-03 DIAGNOSIS — Z01812 Encounter for preprocedural laboratory examination: Secondary | ICD-10-CM | POA: Diagnosis not present

## 2023-04-06 ENCOUNTER — Ambulatory Visit: Payer: BC Managed Care – PPO | Admitting: Psychiatry

## 2023-04-06 ENCOUNTER — Encounter: Payer: Self-pay | Admitting: Psychiatry

## 2023-04-06 DIAGNOSIS — F411 Generalized anxiety disorder: Secondary | ICD-10-CM | POA: Diagnosis not present

## 2023-04-06 DIAGNOSIS — F3342 Major depressive disorder, recurrent, in full remission: Secondary | ICD-10-CM

## 2023-04-06 DIAGNOSIS — F4310 Post-traumatic stress disorder, unspecified: Secondary | ICD-10-CM

## 2023-04-06 MED ORDER — CLONAZEPAM 0.5 MG PO TABS
ORAL_TABLET | ORAL | 2 refills | Status: DC
Start: 2023-04-25 — End: 2023-06-29

## 2023-04-06 MED ORDER — BUPROPION HCL ER (XL) 150 MG PO TB24
150.0000 mg | ORAL_TABLET | Freq: Every day | ORAL | 1 refills | Status: DC
Start: 2023-04-06 — End: 2023-06-29

## 2023-04-06 NOTE — Progress Notes (Signed)
[facility] Counselor/Therapist Progress Note  Patient ID: Peggy Ruiz, MRN: [id number],    Date: 04/06/2023  Time Spent: 50 minutes start time 1:12 PM end time 2:02 PM  Treatment Type: Individual Therapy  Reported Symptoms:  anxiety, chronic pain, sadness, triggered responses, physical issues, rumination, eating issues  Mental Status Exam:  Appearance:   Well Groomed     Behavior:  Appropriate  Motor:  Normal  Speech/Language:   Normal Rate  Affect:  Appropriate  Mood:  anxious  Thought process:  normal  Thought content:    WNL  Sensory/Perceptual disturbances:    WNL  Orientation:  oriented to person, place, time/date, and situation  Attention:  Good  Concentration:  Good  Memory:  WNL  Fund of knowledge:   Good  Insight:    Good  Judgment:   Good  Impulse Control:  Good   Risk Assessment: Danger to Self:  No Self-injurious Behavior: No Danger to Others: No Duty to Warn:no Physical Aggression / Violence:No  Access to Firearms a concern: No  Gang Involvement:No   Subjective: Patient was present for session. She shared that she is having lots of pain emotionally and physically. She explained that she is having extreme pain and had an MRI on her back but will not get results until the day of her surgery. She went on to share that she is waiting to hear from friends that have been going through the hurricanes. She shared she is having issues every time she hears more about what is going on in the world.  Patient explained that she is feeling more hopeless and disconnected the more physical illness that she has and the more pain she is feeling.  Had patient do a mindfulness exercise and through the exercise she was able to recall that she made a different and others live.  Discussed the importance of working on her perspective and remembering the truth even when her feelings are overwhelming.  Discussed how feelings are not facts but they did give her information.  The  importance of her self-care and keeping her brain engaged in positive activity were discussed with patient.  Interventions: Cognitive Behavioral Therapy, Mindfulness Meditation, and Solution-Oriented/Positive Psychology  Diagnosis:   ICD-10-CM   1. PTSD (post-traumatic stress disorder)  F43.10       Plan: Patient is to use CBT and coping skills to decrease triggered responses.  Patient is to follow plans from session to remind herself that she does make a difference and to stay keep her brain engaged in positive activity.  Patient is to  focus on what she can control fix and change she is to start writing them down and reading in them as she needs to.  Patient is to work on affirming herself regularly that she is safe and thinking through what to do to help her feel safe.  Patient is to start looking into the possibility of a service dog.  Patient is to work on Psychologist, sport and exercise on her mirror to repeat regularly.  Patient is to take things in small steps so that she is able to accomplish tasks.  Patient is to take medication as directed.   Patient is to work on focusing on the things that she can accomplish.  Patient is to work on Dr. Rayfield Citizen leaf on the neuro cycle detox.  Patient is to continue working with providers on medical issues   Stevphen Meuse, [facility]
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
## 2023-04-13 ENCOUNTER — Telehealth: Payer: Self-pay | Admitting: Orthopedic Surgery

## 2023-04-13 NOTE — Telephone Encounter (Signed)
Her MRI results are back and show some irritation of the nerve on the right at L4-L5 that may be a little worse. This could cause numbness/tingling and pain in right leg.   Looks like she I having her hysterectomy tomorrow. I am happy to review her MRI results further in person or on the phone. She can let me know when she is ready.   For now I would continue on her neurontin as this can help with leg pain/numbness.

## 2023-04-13 NOTE — Telephone Encounter (Signed)
Please call her and get more information.   Any new injury?  Can she move her right leg, ankle, foot?  If she touches her foot, can she feel it?  I know she has chronic urinary urgency. Any new urinary symptoms? Any bowel incontinence?

## 2023-04-13 NOTE — Telephone Encounter (Signed)
LMOM for patient to return call.

## 2023-04-13 NOTE — Telephone Encounter (Signed)
Patient notified and I scheduled a phone visit to go over MRI.

## 2023-04-13 NOTE — Telephone Encounter (Signed)
Lumbar MRI dated 04/01/23:  FINDINGS: Segmentation: 5 non rib-bearing lumbar type vertebral bodies are present. The lowest fully formed vertebral body is L5.   Alignment: .Slight retrolisthesis 0.5 mm listhesis at L4-5 is stable. Slight retrolisthesis at L2-3 and L3-4 are also stable. Mild leftward curvature is centered at L4.   Vertebrae: Marrow signal and vertebral body heights are normal. Schmorl's nodes at T12-L1, L1-2, L2-3 and L3-4 are stable.   Conus medullaris and cauda equina: Conus extends to the L1 level. Conus and cauda equina appear normal.   Paraspinal and other soft tissues: Limited imaging the abdomen is unremarkable. There is no significant adenopathy. No solid organ lesions are present.   Disc levels:   T12-L1: Normal disc signal and height is present. No focal protrusion or stenosis is present.   L1-2: Normal disc signal and height is present. No focal protrusion or stenosis is present.   L2-3: Mild disc bulging is present without focal protrusion or stenosis.   L3-4: Mild disc bulging is asymmetric to the left, similar the prior study. No focal disc protrusion or stenosis is present.   L4-5: A rightward disc protrusion scratched at a broad-based disc protrusion is asymmetric to the right. Mild subarticular narrowing is present bilaterally, worse on the right. Moderate right foraminal stenosis has progressed. Mild left foraminal narrowing is stable.   L5-S1: Fusion is noted. Right laminectomy is present. No residual or recurrent stenosis is present.   IMPRESSION: 1. Progressive adjacent level moderate right foraminal stenosis at L4-5. 2. Mild subarticular narrowing bilaterally at L4-5 is worse on the right. 3. Mild left foraminal narrowing at L4-5 is stable. 4. Fusion at L5-S1 without residual or recurrent stenosis. 5. Mild disc bulging at L2-3 and L3-4 without significant stenosis.     Electronically Signed   By: Marin Roberts M.D.   On:  04/13/2023 15:24  I have personally reviewed the images and agree with the above interpretation.

## 2023-04-13 NOTE — Telephone Encounter (Signed)
Patient called to let our office know that she had her MRI done on 04/01/2023 and that since she saw Stacy last, her symptoms are worsening. She states that she has been unable to wiggle the toes on her right foot and they feel like they are numb and asleep. She would like to know what she needs to do until her imaging results come back. Please advise.

## 2023-04-13 NOTE — Telephone Encounter (Signed)
Patient states she has no new injuries. Last night she was sitting up in bed with her leg stretched out in front of her and her foot went to sleep. No tingling just not able to feel the foot at all. She got up to try to wake it up and for over an hour she couldn't feel it. Now it is tingling still but she states she has never had it to where she couldn't move the foot or feel it at all. This was a new experience and wants you to know. She does not have any new or worsening urinary symptoms.

## 2023-04-14 DIAGNOSIS — D260 Other benign neoplasm of cervix uteri: Secondary | ICD-10-CM | POA: Diagnosis not present

## 2023-04-14 DIAGNOSIS — F418 Other specified anxiety disorders: Secondary | ICD-10-CM | POA: Diagnosis not present

## 2023-04-14 DIAGNOSIS — K219 Gastro-esophageal reflux disease without esophagitis: Secondary | ICD-10-CM | POA: Diagnosis not present

## 2023-04-14 DIAGNOSIS — D069 Carcinoma in situ of cervix, unspecified: Secondary | ICD-10-CM | POA: Diagnosis not present

## 2023-04-14 DIAGNOSIS — Z888 Allergy status to other drugs, medicaments and biological substances status: Secondary | ICD-10-CM | POA: Diagnosis not present

## 2023-04-14 DIAGNOSIS — Z880 Allergy status to penicillin: Secondary | ICD-10-CM | POA: Diagnosis not present

## 2023-04-14 DIAGNOSIS — N858 Other specified noninflammatory disorders of uterus: Secondary | ICD-10-CM | POA: Diagnosis not present

## 2023-04-14 DIAGNOSIS — D360 Benign neoplasm of lymph nodes: Secondary | ICD-10-CM | POA: Diagnosis not present

## 2023-04-14 DIAGNOSIS — Z79899 Other long term (current) drug therapy: Secondary | ICD-10-CM | POA: Diagnosis not present

## 2023-04-14 DIAGNOSIS — E039 Hypothyroidism, unspecified: Secondary | ICD-10-CM | POA: Diagnosis not present

## 2023-04-14 DIAGNOSIS — Z881 Allergy status to other antibiotic agents status: Secondary | ICD-10-CM | POA: Diagnosis not present

## 2023-04-14 NOTE — Progress Notes (Unsigned)
Telephone Visit- Progress Note: Referring Physician:  No referring provider defined for this encounter.  Primary Physician:  Pcp, No  This visit was performed via telephone.  Patient location: home Provider location: office  I spent a total of 20 minutes non-face-to-face activities for this visit on the date of this encounter including review of current clinical condition and response to treatment.    Patient has given verbal consent to this telephone visits and we reviewed the limitations of a telephone visit. Patient wishes to proceed.    Chief Complaint:  review MRI results  History of Present Illness: Peggy Ruiz is a 37 y.o. female has a history of cervical CA, PTSD, GERD, lyme disease, migraines, pelvic pain, hypothyroidism, FM, and chronic back pain.    She had hysterectomy for cervical CA on Friday 04/14/23.    History of lumbar fusion L5-S1 on 11/24/2016 Dr. Estill Ruiz. This helped her right leg pain but she continued with chronic back pain.   Last seen by me on 03/28/23 for constant LBP and intermittent right leg pain. 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 She has some weakness in right foot with DF/PF/EHL.   Phone visit to review her lumbar MRI results.   She is doing well after her hysterectomy on Friday.   She continues with constant LBP with intermittent right anterior/lateral leg pain to her foot that is getting worse. Her right foot is numb when she has pain in the right leg- this is new. She has weakness in right foot as well. She still has numbness in right leg with walking. Pain is sharp and feels electrical. No left leg pain. Pain is worse with prolonged sitting/standing/walking. She is using a cane.    She was prescribed SUBUTEX from Dr. Ethelene Ruiz- has not started it yet. Was on nycenta and insurance stopped covering it.    Bowel/Bladder Dysfunction: history of chronic urinary urgency (has seen  urology), no bowel incontinence.    Conservative measures:  Physical therapy: did in last year at [facility] in [place] Multimodal medical therapy including regular antiinflammatories: flexeril, neurontin, subutex, nycenta Injections:  Had ESIs at [facility] in [place]- last was in August right L4 TF ESI on 12/23/21   Past Surgery:  revision L5-S1, L4-L5 discectomy and TLIF L5-S1 on 11/24/16.  She's had multiple other lumbar surgeries (2 discectomies?)   The symptoms are causing a significant impact on the patient's life.      Exam: No exam done as this was a telephone encounter.     Imaging: Lumbar MRI dated 04/01/23:  FINDINGS: Segmentation: 5 non rib-bearing lumbar type vertebral bodies are present. The lowest fully formed vertebral body is L5.   Alignment: .Slight retrolisthesis 0.5 mm listhesis at L4-5 is stable. Slight retrolisthesis at L2-3 and L3-4 are also stable. Mild leftward curvature is centered at L4.   Vertebrae: Marrow signal and vertebral body heights are normal. Schmorl's nodes at T12-L1, L1-2, L2-3 and L3-4 are stable.   Conus medullaris and cauda equina: Conus extends to the L1 level. Conus and cauda equina appear normal.   Paraspinal and other soft tissues: Limited imaging the abdomen is unremarkable. There is no significant adenopathy. No solid organ lesions are present.   Disc levels:   T12-L1: Normal disc signal and height is present. No focal protrusion or stenosis is present.   L1-2: Normal disc signal and height is present. No focal protrusion or stenosis is present.  L2-3: Mild disc bulging is present without focal protrusion or stenosis.   L3-4: Mild disc bulging is asymmetric to the left, similar the prior study. No focal disc protrusion or stenosis is present.   L4-5: A rightward disc protrusion scratched at a broad-based disc protrusion is asymmetric to the right. Mild subarticular narrowing is present bilaterally,  worse on the right. Moderate right foraminal stenosis has progressed. Mild left foraminal narrowing is stable.   L5-S1: Fusion is noted. Right laminectomy is present. No residual or recurrent stenosis is present.   IMPRESSION: 1. Progressive adjacent level moderate right foraminal stenosis at L4-5. 2. Mild subarticular narrowing bilaterally at L4-5 is worse on the right. 3. Mild left foraminal narrowing at L4-5 is stable. 4. Fusion at L5-S1 without residual or recurrent stenosis. 5. Mild disc bulging at L2-3 and L3-4 without significant stenosis.     Electronically Signed   By: Peggy Ruiz M.D.   On: 04/13/2023 15:24    I have personally reviewed the images and agree with the above interpretation.  Assessment and Plan: [name] is a pleasant 37 y.o. female has history of lumbar surgery x 3 with revision L5-S1, L4-L5 discectomy and TLIF L5-S1 on 11/24/16. Right leg pain improved after surgery, but she continued with LBP.    She has constant LBP with intermittent right anterior/lateral leg pain to her foot that is getting worse. Her right foot is numb when she has pain in the right leg- this is new. She has weakness in right foot as well. No left leg pain.   She has known retrolisthesis L3-L4 and L4-L5, may have some movement at L4-L5 with extension. She  has adjacent level moderate right foraminal stenosis at L4-L5. At her last visit, she had 4/5 strength in right foot with DF/PF/EHL.    Treatment options discussed with patient and following plan made:    - She did 3 months of PT last year. Will get notes from [facility] in [place].  - Continue current medications from Dr. Ethelene Ruiz.  - Due to weakness, recommend she see Dr. Myer Ruiz to discuss any possible surgery options. Appointment made.   Peggy Leach PA-C Neurosurgery

## 2023-04-18 ENCOUNTER — Ambulatory Visit: Payer: BC Managed Care – PPO | Admitting: Psychiatry

## 2023-04-18 DIAGNOSIS — F4310 Post-traumatic stress disorder, unspecified: Secondary | ICD-10-CM
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
## 2023-04-19 ENCOUNTER — Ambulatory Visit (INDEPENDENT_AMBULATORY_CARE_PROVIDER_SITE_OTHER): Payer: BC Managed Care – PPO | Admitting: Orthopedic Surgery

## 2023-04-19 ENCOUNTER — Encounter: Payer: Self-pay | Admitting: Orthopedic Surgery

## 2023-04-19 DIAGNOSIS — Z981 Arthrodesis status: Secondary | ICD-10-CM

## 2023-04-19 DIAGNOSIS — M5416 Radiculopathy, lumbar region: Secondary | ICD-10-CM

## 2023-04-19 DIAGNOSIS — M48061 Spinal stenosis, lumbar region without neurogenic claudication: Secondary | ICD-10-CM | POA: Diagnosis not present

## 2023-04-19 DIAGNOSIS — M4316 Spondylolisthesis, lumbar region: Secondary | ICD-10-CM | POA: Diagnosis not present

## 2023-04-19 DIAGNOSIS — M47816 Spondylosis without myelopathy or radiculopathy, lumbar region: Secondary | ICD-10-CM

## 2023-04-20 ENCOUNTER — Ambulatory Visit: Payer: BC Managed Care – PPO | Admitting: Psychiatry

## 2023-05-03 DIAGNOSIS — R102 Pelvic and perineal pain: Secondary | ICD-10-CM | POA: Diagnosis not present

## 2023-05-03 DIAGNOSIS — N879 Dysplasia of cervix uteri, unspecified: Secondary | ICD-10-CM | POA: Diagnosis not present

## 2023-05-03 DIAGNOSIS — R300 Dysuria: Secondary | ICD-10-CM | POA: Diagnosis not present

## 2023-05-04 ENCOUNTER — Ambulatory Visit (INDEPENDENT_AMBULATORY_CARE_PROVIDER_SITE_OTHER): Payer: BC Managed Care – PPO | Admitting: Psychiatry

## 2023-05-04 DIAGNOSIS — F4310 Post-traumatic stress disorder, unspecified: Secondary | ICD-10-CM | POA: Diagnosis not present
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
## 2023-05-05 NOTE — Progress Notes (Unsigned)
Referring Physician:  No referring provider defined for this encounter.  Primary Physician:  Pcp, No  History of Present Illness: 05/05/2023 [name] is here today with a chief complaint of ***   Peggy Ruiz has ***no symptoms of cervical myelopathy.  The symptoms are causing a significant impact on the patient's life.   I have utilized the care everywhere function in epic to review the outside records available from external health systems.  History of Present Illness: Notes from Peggy Leach Peggy Ruiz Peggy Ruiz is a 37 y.o. female has a history of cervical CA, PTSD, GERD, lyme disease, migraines, pelvic pain, hypothyroidism, FM, and chronic back pain.    She had hysterectomy for cervical CA on Friday 04/14/23.    History of lumbar fusion L5-S1 on 11/24/2016 Dr. Estill Bakes. This helped her right leg pain but she continued with chronic back pain.    Last seen by me on 03/28/23 for constant LBP and intermittent right leg pain. 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 She has some weakness in right foot with DF/PF/EHL.    Phone visit to review her lumbar MRI results.    She is doing well after her hysterectomy on Friday.    She continues with constant LBP with intermittent right anterior/lateral leg pain to her foot that is getting worse. Her right foot is numb when she has pain in the right leg- this is new. She has weakness in right foot as well. She still has numbness in right leg with walking. Pain is sharp and feels electrical. No left leg pain. Pain is worse with prolonged sitting/standing/walking. She is using a cane.    She was prescribed SUBUTEX from Dr. Ethelene Hal- has not started it yet. Was on nycenta and insurance stopped covering it.    Bowel/Bladder Dysfunction: history of chronic urinary urgency (has seen urology), no bowel incontinence.    Conservative measures:  Physical therapy: did in last year at  [facility] in [place] Multimodal medical therapy including regular antiinflammatories: flexeril, neurontin, subutex, nycenta Injections:  Had ESIs at [facility] in [place]- last was in August right L4 TF ESI on 12/23/21   Past Surgery:  revision L5-S1, L4-L5 discectomy and TLIF L5-S1 on 11/24/16.  She's had multiple other lumbar surgeries (2 discectomies?)  Review of Systems:  A 10 point review of systems is negative, except for the pertinent positives and negatives detailed in the HPI.  Past Medical History: Past Medical History:  Diagnosis Date   Anorexia    at 71   Anxiety    Attention deficit disorder (ADD)    Bone spur    Depression    Eczema    HPV (human papilloma virus) infection     Past Surgical History: Past Surgical History:  Procedure Laterality Date   EYE SURGERY     for lazy eye   HERNIA REPAIR      Allergies: Allergies as of 05/09/2023 - Review Complete 04/19/2023  Allergen Reaction Noted   Benzyl alcohol Hives and Other (See Comments) 06/25/2015   Benzoin Hives and Other (See Comments) 08/11/2022   Promethazine  02/11/2016   Scopolamine Rash 08/11/2022   Ciprofloxacin Other (See Comments) 02/07/2012   Dilaudid [hydromorphone]  04/07/2017   Epinephrine  02/25/2019   Penicillins Hives 02/07/2012   Toradol [ketorolac tromethamine] Nausea And Vomiting 04/07/2017    Medications:  Current Outpatient Medications:    brexpiprazole (REXULTI) 2 MG TABS  tablet, Take 1 tablet (2 mg total) by mouth daily., Disp: 90 tablet, Rfl: 1   buprenorphine (SUBUTEX) 2 MG SUBL SL tablet, Place 2 mg under the tongue daily. (Patient not taking: Reported on 04/06/2023), Disp: , Rfl:    buPROPion (WELLBUTRIN XL) 150 MG 24 hr tablet, Take 1 tablet (150 mg total) by mouth daily., Disp: 90 tablet, Rfl: 1   clonazePAM (KLONOPIN) 0.5 MG tablet, Take 1 tablet (0.5 mg total) by mouth 2 (two) times daily AND 2 tablets (1 mg total) at bedtime., Disp: 120 tablet, Rfl: 2    cyclobenzaprine (FLEXERIL) 10 MG tablet, Take 10 mg by mouth 3 (three) times daily as needed for muscle spasms., Disp: , Rfl:    gabapentin (NEURONTIN) 800 MG tablet, Take 800 mg by mouth 2 (two) times daily., Disp: , Rfl:    lansoprazole (PREVACID) 30 MG capsule, Take 30 mg by mouth 2 (two) times daily before a meal., Disp: , Rfl:    levothyroxine (SYNTHROID, LEVOTHROID) 50 MCG tablet, Take 50 mcg by mouth daily., Disp: , Rfl:    loratadine (CLARITIN) 10 MG tablet, Take by mouth daily as needed., Disp: , Rfl:    magnesium 30 MG tablet, Take 30 mg by mouth 2 (two) times daily., Disp: , Rfl:    Multiple Vitamin (MULTIVITAMIN) tablet, Take 1 tablet by mouth daily., Disp: , Rfl:    nortriptyline (PAMELOR) 10 MG capsule, Take 1 capsule (10 mg total) by mouth at bedtime., Disp: 90 capsule, Rfl: 0   NUCYNTA 50 MG tablet, Take 50 mg by mouth 3 (three) times daily as needed., Disp: , Rfl:    ondansetron (ZOFRAN) 4 MG tablet, Take 4 mg by mouth every 8 (eight) hours as needed., Disp: , Rfl:    ursodiol (ACTIGALL) 300 MG capsule, Take 300 mg by mouth 2 (two) times daily., Disp: , Rfl:   Social History: Social History   Tobacco Use   Smoking status: Never   Smokeless tobacco: Never  Vaping Use   Vaping status: Never Used  Substance Use Topics   Alcohol use: Yes    Comment: occasion   Drug use: No    Family Medical History: Family History  Problem Relation Age of Onset   Fibromyalgia Mother    Anxiety disorder Mother    Depression Mother    Depression Father    Anxiety disorder Father     Physical Examination: There were no vitals filed for this visit.  General: Patient is in no apparent distress. Attention to examination is appropriate.  Neck:   Supple.  Full range of motion.  Respiratory: Patient is breathing without any difficulty.   NEUROLOGICAL:     Awake, alert, oriented to person, place, and time.  Speech is clear and fluent.   Cranial Nerves: Pupils equal round and  reactive to light.  Facial tone is symmetric.  Facial sensation is symmetric. Shoulder shrug is symmetric. Tongue protrusion is midline.  There is no pronator drift.  Strength: Side Biceps Triceps Deltoid Interossei Grip Wrist Ext. Wrist Flex.  R 5 5 5 5 5 5 5   L 5 5 5 5 5 5 5    Side Iliopsoas Quads Hamstring PF DF EHL  R 5 5 5 5 5 5   L 5 5 5 5 5 5    Reflexes are ***2+ and symmetric at the biceps, triceps, brachioradialis, patella and achilles.   Hoffman's is absent.   Bilateral upper and lower extremity sensation is intact to light touch.  No evidence of dysmetria noted.  Gait is normal.     Medical Decision Making  Imaging: ***  I have personally reviewed the images and agree with the above interpretation.  Assessment and Plan: Ms. Atalla is a pleasant 37 y.o. female with ***    Thank you for involving me in the care of this patient.      Gordana Kewley K. Myer Haff MD, [facility] Neurosurgery

## 2023-05-09 ENCOUNTER — Ambulatory Visit (INDEPENDENT_AMBULATORY_CARE_PROVIDER_SITE_OTHER): Payer: BC Managed Care – PPO | Admitting: Neurosurgery

## 2023-05-09 ENCOUNTER — Encounter: Payer: Self-pay | Admitting: Neurosurgery

## 2023-05-09 VITALS — BP 110/78 | Ht 68.0 in | Wt 137.0 lb

## 2023-05-09 DIAGNOSIS — G8929 Other chronic pain: Secondary | ICD-10-CM

## 2023-05-09 DIAGNOSIS — M51362 Other intervertebral disc degeneration, lumbar region with discogenic back pain and lower extremity pain: Secondary | ICD-10-CM

## 2023-05-09 DIAGNOSIS — Z981 Arthrodesis status: Secondary | ICD-10-CM

## 2023-05-09 DIAGNOSIS — Z881 Allergy status to other antibiotic agents status: Secondary | ICD-10-CM | POA: Diagnosis not present

## 2023-05-09 DIAGNOSIS — M48061 Spinal stenosis, lumbar region without neurogenic claudication: Secondary | ICD-10-CM

## 2023-05-10 ENCOUNTER — Encounter: Payer: Self-pay | Admitting: Psychiatry

## 2023-05-11 DIAGNOSIS — Z79899 Other long term (current) drug therapy: Secondary | ICD-10-CM | POA: Diagnosis not present

## 2023-05-11 DIAGNOSIS — M961 Postlaminectomy syndrome, not elsewhere classified: Secondary | ICD-10-CM | POA: Diagnosis not present

## 2023-05-11 DIAGNOSIS — M5416 Radiculopathy, lumbar region: Secondary | ICD-10-CM | POA: Diagnosis not present

## 2023-05-11 DIAGNOSIS — Z5181 Encounter for therapeutic drug level monitoring: Secondary | ICD-10-CM | POA: Diagnosis not present

## 2023-05-11 DIAGNOSIS — M5459 Other low back pain: Secondary | ICD-10-CM | POA: Diagnosis not present

## 2023-05-11 DIAGNOSIS — M5412 Radiculopathy, cervical region: Secondary | ICD-10-CM | POA: Diagnosis not present

## 2023-05-16 ENCOUNTER — Inpatient Hospital Stay
Admission: RE | Admit: 2023-05-16 | Discharge: 2023-05-16 | Disposition: A | Discharge status: Home / Self Care | Payer: Self-pay | Source: Ambulatory Visit | Attending: Neurosurgery | Admitting: Neurosurgery

## 2023-05-16 ENCOUNTER — Other Ambulatory Visit: Payer: Self-pay | Admitting: Family Medicine

## 2023-05-16 DIAGNOSIS — Z049 Encounter for examination and observation for unspecified reason: Secondary | ICD-10-CM

## 2023-05-17 ENCOUNTER — Telehealth: Payer: Self-pay | Admitting: Family Medicine

## 2023-05-17 NOTE — Telephone Encounter (Signed)
I called patient to inform her that we still have not received the PT notes from [facility] in [place]. She states she is going to reach out to them again and try to get them sent. She also said she will go to the office if she needs to to get the records.

## 2023-05-17 NOTE — Telephone Encounter (Signed)
Patient wants to have surgery and not the stimulator. She states she can't do it until March because her father is having surgery and will need 3 months to recover.

## 2023-05-18 ENCOUNTER — Ambulatory Visit: Payer: BC Managed Care – PPO | Admitting: Psychiatry

## 2023-05-18 ENCOUNTER — Encounter: Payer: Self-pay | Admitting: Family Medicine

## 2023-05-18 DIAGNOSIS — F4310 Post-traumatic stress disorder, unspecified: Secondary | ICD-10-CM | POA: Diagnosis not present
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
## 2023-05-22 ENCOUNTER — Encounter: Payer: Self-pay | Admitting: Neurosurgery

## 2023-05-28 ENCOUNTER — Encounter: Payer: Self-pay | Admitting: Emergency Medicine

## 2023-05-28 ENCOUNTER — Emergency Department
Admission: EM | Admit: 2023-05-28 | Discharge: 2023-05-28 | Disposition: A | Discharge status: Home / Self Care | Payer: BC Managed Care – PPO | Attending: Emergency Medicine | Admitting: Emergency Medicine

## 2023-05-28 DIAGNOSIS — R300 Dysuria: Secondary | ICD-10-CM | POA: Diagnosis not present

## 2023-05-28 DIAGNOSIS — N3000 Acute cystitis without hematuria: Secondary | ICD-10-CM | POA: Diagnosis not present

## 2023-05-28 LAB — URINALYSIS, ROUTINE W REFLEX MICROSCOPIC
Bilirubin Urine: NEGATIVE
Glucose, UA: NEGATIVE mg/dL
Ketones, ur: NEGATIVE mg/dL
Nitrite: NEGATIVE
Protein, ur: NEGATIVE mg/dL
Specific Gravity, Urine: 1.004 — ABNORMAL LOW (ref 1.005–1.030)
WBC, UA: >50 WBC/hpf (ref 0–5)
pH: 7 (ref 5.0–8.0)

## 2023-05-28 LAB — COMPREHENSIVE METABOLIC PANEL
ALT: 16 U/L (ref 0–44)
AST: 25 U/L (ref 15–41)
Albumin: 4.5 g/dL (ref 3.5–5.0)
Alkaline Phosphatase: 50 U/L (ref 38–126)
Anion gap: 7 (ref 5–15)
BUN: 11 mg/dL (ref 6–20)
CO2: 27 mmol/L (ref 22–32)
Calcium: 8.7 mg/dL — ABNORMAL LOW (ref 8.9–10.3)
Chloride: 102 mmol/L (ref 98–111)
Creatinine, Ser: 0.92 mg/dL (ref 0.44–1.00)
GFR, Estimated: >60 mL/min (ref >60)
Glucose, Bld: 94 mg/dL (ref 70–99)
Potassium: 3.9 mmol/L (ref 3.5–5.1)
Sodium: 136 mmol/L (ref 135–145)
Total Bilirubin: 0.5 mg/dL (ref <1.2)
Total Protein: 7.2 g/dL (ref 6.5–8.1)

## 2023-05-28 LAB — CBC WITH DIFFERENTIAL/PLATELET
Abs Immature Granulocytes: 0.03 10*3/uL (ref 0.00–0.07)
Basophils Absolute: 0 10*3/uL (ref 0.0–0.1)
Basophils Relative: 0 %
Eosinophils Absolute: 0.1 10*3/uL (ref 0.0–0.5)
Eosinophils Relative: 1 %
HCT: 33.5 % — ABNORMAL LOW (ref 36.0–46.0)
Hemoglobin: 11.7 g/dL — ABNORMAL LOW (ref 12.0–15.0)
Immature Granulocytes: 0 %
Lymphocytes Relative: 26 %
Lymphs Abs: 2.1 10*3/uL (ref 0.7–4.0)
MCH: 32.1 pg (ref 26.0–34.0)
MCHC: 34.9 g/dL (ref 30.0–36.0)
MCV: 91.8 fL (ref 80.0–100.0)
Monocytes Absolute: 0.6 10*3/uL (ref 0.1–1.0)
Monocytes Relative: 7 %
Neutro Abs: 5.2 10*3/uL (ref 1.7–7.7)
Neutrophils Relative %: 66 %
Platelets: 225 10*3/uL (ref 150–400)
RBC: 3.65 MIL/uL — ABNORMAL LOW (ref 3.87–5.11)
RDW: 11.3 % — ABNORMAL LOW (ref 11.5–15.5)
WBC: 8 10*3/uL (ref 4.0–10.5)
nRBC: 0 % (ref 0.0–0.2)

## 2023-05-28 MED ORDER — CEPHALEXIN 500 MG PO CAPS
500.0000 mg | ORAL_CAPSULE | Freq: Once | ORAL | Status: AC
  Administered 2023-05-28: 500 mg via ORAL
  Filled 2023-05-28: qty 1

## 2023-05-28 MED ORDER — CEPHALEXIN 500 MG PO CAPS: 500.0000 mg | ORAL_CAPSULE | Freq: Four times a day (QID) | ORAL | 0 refills | Status: AC

## 2023-05-28 NOTE — ED Provider Notes (Signed)
[facility] Provider Note    Event Date/Time   First MD Initiated Contact with Patient 05/28/23 2123     (approximate)   History   Pelvic Pain   HPI  Peggy Ruiz is a 37 y.o. female   Past medical history of hysterectomy for cancer, recent recurrent UTIs who presents to the Emergency Department with suprapubic discomfort and dysuria for the last couple of days.  Worsening.  No fevers no chills.  No vaginal bleeding or discharge.  She has no other acute medical complaints.  She had similar symptoms last month when she was diagnosed with a urinary tract infection that completely resolved with Macrobid.  Independent Historian contributed to assessment above: Husband is at bedside to corroborate information past medical history as above    Physical Exam   Triage Vital Signs: ED Triage Vitals [05/28/23 2001]  Encounter Vitals Group     BP      Systolic BP Percentile      Diastolic BP Percentile      Pulse      Resp      Temp      Temp src      SpO2      Weight      Height      Head Circumference      Peak Flow      Pain Score 7     Pain Loc      Pain Education      Exclude from Growth Chart     Most recent vital signs: There were no vitals filed for this visit.  General: Awake, no distress.  CV:  Good peripheral perfusion.  Resp:  Normal effort.  Abd:  No distention.  Other:  Awake alert comfortable appearing.  Soft nontender abdomen to palpation all quadrants.  She has some mild discomfort with palpation of the suprapubic area.  Comfortable nontoxic appearance.  ED Results / Procedures / Treatments   Labs (all labs ordered are listed, but only abnormal results are displayed) Labs Reviewed  URINALYSIS, ROUTINE W REFLEX MICROSCOPIC - Abnormal; Notable for the following components:      Result Value   Color, Urine YELLOW (*)    APPearance CLOUDY (*)    Specific Gravity, Urine 1.004 (*)    Hgb urine dipstick SMALL (*)     Leukocytes,Ua LARGE (*)    Bacteria, UA RARE (*)    All other components within normal limits  CBC WITH DIFFERENTIAL/PLATELET - Abnormal; Notable for the following components:   RBC 3.65 (*)    Hemoglobin 11.7 (*)    HCT 33.5 (*)    RDW 11.3 (*)    All other components within normal limits  COMPREHENSIVE METABOLIC PANEL - Abnormal; Notable for the following components:   Calcium 8.7 (*)    All other components within normal limits  URINE CULTURE     I ordered and reviewed the above labs they are notable for bacteria and leukocytes in the urine.  White blood cell count is normal.   PROCEDURES:  Critical Care performed: No  Procedures   MEDICATIONS ORDERED IN ED: Medications  cephALEXin (KEFLEX) capsule 500 mg (has no administration in time range)   IMPRESSION / MDM / ASSESSMENT AND PLAN / ED COURSE  I reviewed the triage vital signs and the nursing notes.  Patient's presentation is most consistent with acute presentation with potential threat to life or bodily function.  Differential diagnosis includes, but is not limited to, urinary tract infection, pyelonephritis or sepsis   The patient is on the cardiac monitor to evaluate for evidence of arrhythmia and/or significant heart rate changes.  MDM:    Patient with dysuria, suprapubic discomfort with bacteriuria and inflammatory changes in the urine consistent with urinary tract infection.  Nontoxic doubt sepsis.  Considered other pelvic infections especially in light of recent hysterectomy but without vaginal discharge and a benign abdominal exam and especially in light of urinalysis consistent with urinary tract infection think these are less likely.  Plan for antibiotic therapy as outpatient, PMD follow-up and return precautions.        FINAL CLINICAL IMPRESSION(S) / ED DIAGNOSES   Final diagnoses:  Acute cystitis without hematuria     Rx / DC Orders   ED Discharge Orders           Ordered    cephALEXin (KEFLEX) 500 MG capsule  4 times daily        05/28/23 2139             Note:  This document was prepared using Dragon voice recognition software and may include unintentional dictation errors.    Pilar Jarvis, MD 05/28/23 [phone]

## 2023-05-28 NOTE — ED Triage Notes (Signed)
Pt here from home with c/o pelvic pain that started this evening after dinner , pt had a hysterectomy back in Oct , pt has been having some freq urination along with some blood in her urine

## 2023-05-28 NOTE — Discharge Instructions (Addendum)
Take antibiotics for the full 7-day course as prescribed.  You can take Pyridium as well. Take acetaminophen 650 mg and ibuprofen 400 mg every 6 hours for pain.  Take with food. Thank you for choosing [place] for your health care today!  Please see your primary doctor this week for a follow up appointment.   If you have any new, worsening, or unexpected symptoms call your doctor right away or come back to the emergency department for reevaluation.  It was my pleasure to care for you today.   Daneil Dan Modesto Charon, MD

## 2023-05-31 ENCOUNTER — Ambulatory Visit: Payer: BC Managed Care – PPO | Admitting: Psychiatry

## 2023-05-31 DIAGNOSIS — F4310 Post-traumatic stress disorder, unspecified: Secondary | ICD-10-CM

## 2023-05-31 LAB — URINE CULTURE: Culture: >=100000 — AB

## 2023-05-31 NOTE — Progress Notes (Signed)
[facility] Counselor/Therapist Progress Note  Patient ID: Peggy Ruiz, MRN: [id number],    Date: 05/31/2023  Time Spent: 55 minutes start time 10:05 AM end time 11:00 AM  Treatment Type: Individual Therapy  Reported Symptoms: pain, anxiety, triggered responses, sadness, rumination, fatigue  Mental Status Exam:  Appearance:   Well Groomed     Behavior:  Appropriate  Motor:  Normal  Speech/Language:   Normal Rate  Affect:  Appropriate  Mood:  anxious  Thought process:  normal  Thought content:    WNL  Sensory/Perceptual disturbances:    WNL  Orientation:  oriented to person, place, time/date, and situation  Attention:  Good  Concentration:  Good  Memory:  WNL  Fund of knowledge:   Good  Insight:    Good  Judgment:   Good  Impulse Control:  Good   Risk Assessment: Danger to Self:  No Self-injurious Behavior: No Danger to Others: No Duty to Warn:no Physical Aggression / Violence:No  Access to Firearms a concern: No  Gang Involvement:No   Subjective: Patient was present for session. She shared that she was back in the ER due to a bad UTI and it is the second in 2 weeks. Her dad had surgery the next morning that she had to take him to. Her mother is having a back surgery on Friday and they have had to move in with she and her husband.  She is getting overwhelmed because it is just a lot. She shared that Thanksgiving did go well with her brother.  Patient was given the chance to think through all the things that were currently going on and her sick current situation.  Helped her recognize what were the priorities and what were the things that she could let go up.  Different ways to talk herself through her different situations were discussed in session.  The importance of her reminding herself to focus on what she can do and to know what her limits are were discussed with patient as well.  Patient was able to develop some plans that she felt would be remote manageable the  importance of her own self-care was addressed.  Interventions: Solution-Oriented/Positive Psychology and Insight-Oriented  Diagnosis:   ICD-10-CM   1. PTSD (post-traumatic stress disorder)  F43.10       Plan: Patient is to use CBT and coping skills to decrease triggered responses.  Patient is to follow plans from session to make sure she takes care of herself as she takes care of her parents.  Patient is to remind herself of all the progress she has made in treatment and to journal her insights.  Patient is to focus on what she can control fix and change she is to start writing them down and reading in them as she needs to.  Patient is to work on affirming herself regularly that she is safe and thinking through what to do to help her feel safe.  Patient is to start looking into the possibility of a service dog.  Patient is to work on Psychologist, sport and exercise on her mirror to repeat regularly.  Patient is to take things in small steps so that she is able to accomplish tasks.  Patient is to take medication as directed.   Patient is to work on focusing on the things that she can accomplish.  Patient is to work on Dr. Rayfield Citizen leaf on the neuro cycle detox.  Patient is to continue working with providers on medical issues  Stevphen Meuse, [facility]

## 2023-06-05 ENCOUNTER — Ambulatory Visit (INDEPENDENT_AMBULATORY_CARE_PROVIDER_SITE_OTHER): Payer: BC Managed Care – PPO | Admitting: Internal Medicine

## 2023-06-05 ENCOUNTER — Other Ambulatory Visit: Payer: Self-pay

## 2023-06-05 VITALS — BP 112/70 | HR 90 | Temp 98.1°F | Resp 16 | Ht 68.0 in | Wt 137.1 lb

## 2023-06-05 DIAGNOSIS — C539 Malignant neoplasm of cervix uteri, unspecified: Secondary | ICD-10-CM | POA: Diagnosis not present

## 2023-06-05 DIAGNOSIS — E039 Hypothyroidism, unspecified: Secondary | ICD-10-CM

## 2023-06-05 DIAGNOSIS — K3184 Gastroparesis: Secondary | ICD-10-CM | POA: Diagnosis not present

## 2023-06-05 DIAGNOSIS — F339 Major depressive disorder, recurrent, unspecified: Secondary | ICD-10-CM

## 2023-06-05 DIAGNOSIS — M48061 Spinal stenosis, lumbar region without neurogenic claudication: Secondary | ICD-10-CM

## 2023-06-05 MED ORDER — LEVOTHYROXINE SODIUM 50 MCG PO TABS
50.0000 ug | ORAL_TABLET | Freq: Every day | ORAL | 0 refills | Status: DC
Start: 2023-06-05 — End: 2023-11-28

## 2023-06-05 NOTE — Progress Notes (Signed)
New Patient Office Visit  Subjective    Patient ID: Peggy Ruiz, female    DOB: Apr 28, 1986  Age: 37 y.o. MRN: [id number]  CC:  Chief Complaint  Patient presents with   Establish Care    HPI Peggy Ruiz presents to establish care.  Patient just moved from the [place] area.  MDD: -Seeing psychiatric NP at [facility], following her to new practice  -Mood status: stable -Current treatment: Rexulti 2 mg, Wellbutrin XL 150 mg, Klonopin 0.5 mg 4 times daily -Satisfied with current treatment?: yes -Duration of current treatment : chronic -Side effects: no Medication compliance: excellent compliance     06/05/2023    1:10 PM  Depression screen PHQ 2/9  Decreased Interest 0  Down, Depressed, Hopeless 1  PHQ - 2 Score 1  Altered sleeping 0  Tired, decreased energy 2  Change in appetite 1  Feeling bad or failure about yourself  2  Trouble concentrating 0  Moving slowly or fidgety/restless 0  Suicidal thoughts 0  PHQ-9 Score 6   Hypothyroidism: -Medications: Levothyroxine 50 mcg -Patient is compliant with the above medication (s) at the above dose and reports no medication side effects.  -Denies weight changes, cold./heat intolerance, skin changes, anxiety/palpitations  -Last TSH: 2/24 1.7710, TPO antibody negative in the past when she was first diagnosed in 2019  Hx of Adenocarcinoma in situ of the cervix: -S/p robotic assisted hysterectomy with bilateral salpingectomy plus sentinel lymph node dissection on April 14, 2023. No invasive cancer after pathology evaluation -Still following with surgery at [facility] -Patient had a complication after surgery of having an allergic reaction to the Hibiclens used to clean her skin and vagina.  Patient was having severe itching and ended up being treated with steroids which then led her to have to UTIs.  She just finished her second round of antibiotics yesterday and is currently denying urinary symptoms. -Allergy of hibiclens,  treated with steroid, then had 2 UTI's, just finished antibiotic yesterday   Gastroparesis:  -Following with GI, Dr. Donivan Scull at [facility]. Last seen 04/03/23 -Currently on Prevacid 30 mg, magesium citrate, Nortriptyline 10 mg, Usodiol 300 mg BID for bile production and Zofran PRN  Lumbar spinal stenosis: -Patient has a pre-existing history of L5-S1 fusion in 2018 but was having an exacerbation of symptoms and pain -Was just seen by Dr. Marcell Barlow on 05/09/2023, now planning potential extension of fusion to L4 but patient cannot undergo this procedure until March due to home situation -She does follow with Dr. Ethelene Hal, orthopedic surgeon at [facility] in [place] who is treating her pain with Nucynta 75 mgm, Gabapentin 800 mg BID  Health Maintenance: -Blood work UTD -Just underwent hysterectomy - having Paps yearly for the next few years. First one June 2025.   Outpatient Encounter Medications as of 06/05/2023  Medication Sig   brexpiprazole (REXULTI) 2 MG TABS tablet Take 1 tablet (2 mg total) by mouth daily.   buPROPion (WELLBUTRIN XL) 150 MG 24 hr tablet Take 1 tablet (150 mg total) by mouth daily.   gabapentin (NEURONTIN) 800 MG tablet Take 800 mg by mouth 2 (two) times daily.   lansoprazole (PREVACID) 30 MG capsule Take 30 mg by mouth 2 (two) times daily before a meal.   loratadine (CLARITIN) 10 MG tablet Take by mouth daily as needed.   Magnesium Oxide, Laxative, 500 MG TABS    Multiple Vitamin (MULTIVITAMIN) tablet Take 1 tablet by mouth daily.   nortriptyline (PAMELOR) 10 MG capsule Take 1  capsule (10 mg total) by mouth at bedtime.   NUCYNTA 75 MG tablet Take 75 mg by mouth every 6 (six) hours as needed.   ondansetron (ZOFRAN-ODT) 8 MG disintegrating tablet Take 8 mg by mouth every 8 (eight) hours as needed.   ursodiol (ACTIGALL) 300 MG capsule Take 300 mg by mouth 2 (two) times daily.   [DISCONTINUED] levothyroxine (SYNTHROID, LEVOTHROID) 50 MCG tablet Take 50 mcg by mouth  daily.   clonazePAM (KLONOPIN) 0.5 MG tablet Take 1 tablet (0.5 mg total) by mouth 2 (two) times daily AND 2 tablets (1 mg total) at bedtime.   cyclobenzaprine (FLEXERIL) 10 MG tablet Take 10 mg by mouth 3 (three) times daily as needed for muscle spasms.   levothyroxine (SYNTHROID) 50 MCG tablet Take 1 tablet (50 mcg total) by mouth daily.   [DISCONTINUED] magnesium 30 MG tablet Take 30 mg by mouth 2 (two) times daily.   [DISCONTINUED] morphine (MSIR) 15 MG tablet Take 15 mg by mouth 4 (four) times daily as needed.   No facility-administered encounter medications on file as of 06/05/2023.    Past Medical History:  Diagnosis Date   Anorexia    at 6   Anxiety    Attention deficit disorder (ADD)    Bone spur    Depression    Eczema    HPV (human papilloma virus) infection    Thyroid disease     Past Surgical History:  Procedure Laterality Date   ABDOMINAL HYSTERECTOMY     BACK SURGERY     3 different surgeries   cervical cone ciopsy     EYE SURGERY     for lazy eye   GALLBLADDER SURGERY     HERNIA REPAIR      Family History  Problem Relation Age of Onset   Fibromyalgia Mother    Anxiety disorder Mother    Depression Mother    Depression Father    Anxiety disorder Father     Social History   Socioeconomic History   Marital status: Married    Spouse name: Not on file   Number of children: Not on file   Years of education: Not on file   Highest education level: Bachelor's degree (e.g., BA, AB, BS)  Occupational History   Not on file  Tobacco Use   Smoking status: Never   Smokeless tobacco: Never  Vaping Use   Vaping status: Never Used  Substance and Sexual Activity   Alcohol use: Yes    Comment: occasion   Drug use: No   Sexual activity: Never    Birth control/protection: Surgical  Other Topics Concern   Not on file  Social History Narrative   Regular exercise: noneCaffeine use: daily. Recently moved from [place], [place]   Social Determinants of Health    Financial Resource Strain: Low Risk  (06/05/2023)   Overall Financial Resource Strain (CARDIA)    Difficulty of Paying Living Expenses: Not hard at all  Food Insecurity: No Food Insecurity (06/05/2023)   Hunger Vital Sign    Worried About Running Out of Food in the Last Year: Never true    Ran Out of Food in the Last Year: Never true  Transportation Needs: No Transportation Needs (06/05/2023)   PRAPARE - Administrator, Civil Service (Medical): No    Lack of Transportation (Non-Medical): No  Physical Activity: Unknown (06/05/2023)   Exercise Vital Sign    Days of Exercise per Week: 0 days    Minutes of Exercise per  Session: Not on file  Stress: No Stress Concern Present (06/05/2023)   [employer] of Occupational Health - Occupational Stress Questionnaire    Feeling of Stress : Only a little  Social Connections: Moderately Integrated (06/05/2023)   Social Connection and Isolation Panel [NHANES]    Frequency of Communication with Friends and Family: More than three times a week    Frequency of Social Gatherings with Friends and Family: Once a week    Attends Religious Services: More than 4 times per year    Active Member of [employer] or Organizations: No    Attends Engineer, structural: Not on file    Marital Status: Married  Catering manager Violence: Not At Risk (04/03/2023)   Received from [facility]   HITS    Over the last 12 months how often did your partner physically hurt you?: Never    Over the last 12 months how often did your partner insult you or talk down to you?: Never    Over the last 12 months how often did your partner threaten you with physical harm?: Never    Over the last 12 months how often did your partner scream or curse at you?: Never    Review of Systems  All other systems reviewed and are negative.       Objective    BP 112/70   Pulse 90   Temp 98.1 F (36.7 C) (Oral)   Resp 16   Ht 5\' 8"  (1.727 m)   Wt 137 lb 1.6 oz (62.2  kg)   LMP 03/02/2017 (Exact Date)   SpO2 98%   BMI 20.85 kg/m   Physical Exam Constitutional:      Appearance: Normal appearance.  HENT:     Head: Normocephalic and atraumatic.     Right Ear: Tympanic membrane, ear canal and external ear normal.     Left Ear: Tympanic membrane, ear canal and external ear normal.     Nose: Nose normal.     Mouth/Throat:     Mouth: Mucous membranes are moist.     Pharynx: Oropharynx is clear.  Eyes:     Conjunctiva/sclera: Conjunctivae normal.  Cardiovascular:     Rate and Rhythm: Normal rate and regular rhythm.  Pulmonary:     Effort: Pulmonary effort is normal.     Breath sounds: Normal breath sounds.  Skin:    General: Skin is warm and dry.  Neurological:     General: No focal deficit present.     Mental Status: She is alert. Mental status is at baseline.  Psychiatric:        Mood and Affect: Mood normal.        Behavior: Behavior normal.         Assessment & Plan:   1. Hypothyroidism, unspecified type: Thyroid labs normal in February of last year.  Will refill levothyroxine 50 mcg daily.  Patient will follow-up in about 3 months to recheck labs.  - levothyroxine (SYNTHROID) 50 MCG tablet; Take 1 tablet (50 mcg total) by mouth daily.  Dispense: 90 tablet; Refill: 0  2. Episode of recurrent major depressive disorder, unspecified depression episode severity [facility]): Patient following with psychiatry, reviewed medications.  She is currently on Rexulti 2 mg, Wellbutrin 300 mg and Klonopin 0.5 mg 4 times daily.  3. Gastroparesis: Following with GI at [facility], reviewed note from 04/03/2023.  Gastroparesis is something the patient has been dealing with since she was a small child, along with chronic constipation.  Currently her symptoms are controlled with Prevacid 30 mg, magnesium citrate, nortriptyline 10 mg and Usodiol 300 mg twice daily.  - Magnesium Oxide, Laxative, 500 MG TABS  4. Adenocarcinoma of cervix [facility]): Just underwent  hysterectomy in October but had complications including allergic reaction to skin antiseptic and 2 UTIs.  She just finished her second round of antibiotics yesterday and is currently denying urinary symptoms.  She is following with her surgeon soon.  Planning on repeating Pap in June 2025.  5. Spinal stenosis of lumbar region, unspecified whether neurogenic claudication present: Following with orthopedics and neurosurgery.  Planning on extending fusion to L4 vertebrae but not potentially until the spring.  Return in about 3 months (around 09/03/2023).   Margarita Mail, DO

## 2023-06-09 ENCOUNTER — Encounter: Payer: Self-pay | Admitting: Neurosurgery

## 2023-06-12 ENCOUNTER — Other Ambulatory Visit: Payer: Self-pay | Admitting: Family Medicine

## 2023-06-12 DIAGNOSIS — Z881 Allergy status to other antibiotic agents status: Secondary | ICD-10-CM

## 2023-06-13 ENCOUNTER — Telehealth (INDEPENDENT_AMBULATORY_CARE_PROVIDER_SITE_OTHER): Payer: BC Managed Care – PPO | Admitting: Psychiatry

## 2023-06-13 DIAGNOSIS — F4310 Post-traumatic stress disorder, unspecified: Secondary | ICD-10-CM | POA: Diagnosis not present

## 2023-06-13 NOTE — Progress Notes (Signed)
[facility] Counselor/Therapist Progress Note  Patient ID: Peggy Ruiz, MRN: [id number],    Date: 06/13/2023  Time Spent: 56 minutes start time 9:04 AM end time 10:00 AM Virtual Visit via Video Note Connected with patient by a telemedicine/telehealth application, with their informed consent, and verified patient privacy and that I am speaking with the correct person using two identifiers. I discussed the limitations, risks, security and privacy concerns of performing psychotherapy and the availability of in person appointments. I also discussed with the patient that there may be a patient responsible charge related to this service. The patient expressed understanding and agreed to proceed. I discussed the treatment planning with the patient. The patient was provided an opportunity to ask questions and all were answered. The patient agreed with the plan and demonstrated an understanding of the instructions. The patient was advised to call  our office if  symptoms worsen or feel they are in a crisis state and need immediate contact.   Therapist Location: home Patient Location: home    Treatment Type: Individual Therapy  Reported Symptoms: anxiety, health issues, chronic pain, triggered responses, crying spells sadness, fatigue  Mental Status Exam:  Appearance:   Casual and Neat     Behavior:  Appropriate  Motor:  Normal  Speech/Language:   Normal Rate  Affect:  Appropriate  Mood:  sad  Thought process:  normal  Thought content:    WNL  Sensory/Perceptual disturbances:    pain  Orientation:  oriented to person, place, time/date, and situation  Attention:  Good  Concentration:  Good  Memory:  WNL  Fund of knowledge:   Good  Insight:    Good  Judgment:   Good  Impulse Control:  Good   Risk Assessment: Danger to Self:  No Self-injurious Behavior: No Danger to Others: No Duty to Warn:no Physical Aggression / Violence:No  Access to Firearms a concern: No  Gang  Involvement:No   Subjective: Patient was present for virtual session. She shared she is very run down and up to 3 pain pills a day to function and having a hard time eating. She went on to share she is realizing that taking care of 2 extra adults has been a lot and she doesn't get a moment to herself. She shared she still has 4 more weeks and she is not sure how she can do it. She is having people bring 2 meals a week and that has helped some.  She shared she is now having stomach issues and struggling to eat with feeling nauseated. Her brother came over for a visit and that adds another level of stress. She shared she is still not sure what to do concerning her back. She went on to share she is having allergy testing in January. She shared that she is also getting UTIs since her hysterectomy. Encouraged her to talk to doctor about that. She went on to share that all this is triggering memories from when she was so sick she had to be put on a feeding tube.  Patient was encouraged to recognize that at this point she has got to start listening to her body and do things that are more beneficial for her.  She shared that she gets to the point of exhaustion and then pushes on due to not wanting to disappoint anybody.  Patient was encouraged to see that her difficulty saying no to others is keeping her from being healthy because she is ignoring her body and  not taking care of herself.  Patient was encouraged to see that if she does take care of herself she will be able to help her parents for a longer.  Time but if she continues on the path that she is taking she could end up again in the hospital.  Patient was able to think through some ways that she could talk to her parents about the situation so that she would be able to take better care of herself.  Interventions: Solution-Oriented/Positive Psychology and Insight-Oriented  Diagnosis:   ICD-10-CM   1. PTSD (post-traumatic stress disorder)  F43.10        Plan: Patient is to use CBT and coping skills to decrease triggered responses.  Patient is to follow plans from session to make sure she takes care of herself as she takes care of her parents.  Patient is to remind herself of all the progress she has made in treatment and to journal her insights.  Patient is to focus on what she can control fix and change she is to start writing them down and reading in them as she needs to.  Patient is to work on affirming herself regularly that she is safe and thinking through what to do to help her feel safe.  Patient is to start looking into the possibility of a service dog.  Patient is to work on Psychologist, sport and exercise on her mirror to repeat regularly.  Patient is to take things in small steps so that she is able to accomplish tasks.  Patient is to take medication as directed.   Patient is to work on focusing on the things that she can accomplish.  Patient is to work on Dr. Rayfield Citizen leaf on the neuro cycle detox.  Patient is to continue working with providers on medical issues   Stevphen Meuse, [facility]

## 2023-06-26 ENCOUNTER — Encounter: Payer: Self-pay | Admitting: Neurosurgery

## 2023-06-26 NOTE — Telephone Encounter (Signed)
Spoke to patient and she states she will not be able to push her surgery sooner. She already has an appointment schedule to discuss surgery with Dr. Myer Haff. She has appointments for the allergy testing to get definite reactions to the prep. I informed her that if Myer Haff needs to have updated images he can order them at her up coming appointment. Patient agreed and voiced understanding.

## 2023-06-29 ENCOUNTER — Encounter: Payer: Self-pay | Admitting: Psychiatry

## 2023-06-29 ENCOUNTER — Ambulatory Visit: Payer: BC Managed Care – PPO | Admitting: Psychiatry

## 2023-06-29 ENCOUNTER — Ambulatory Visit: Payer: Self-pay | Admitting: Internal Medicine

## 2023-06-29 DIAGNOSIS — F3342 Major depressive disorder, recurrent, in full remission: Secondary | ICD-10-CM | POA: Diagnosis not present

## 2023-06-29 DIAGNOSIS — F4310 Post-traumatic stress disorder, unspecified: Secondary | ICD-10-CM | POA: Diagnosis not present

## 2023-06-29 DIAGNOSIS — F411 Generalized anxiety disorder: Secondary | ICD-10-CM

## 2023-06-29 MED ORDER — BUPROPION HCL ER (XL) 150 MG PO TB24: 150.0000 mg | ORAL_TABLET | Freq: Every day | ORAL | 1 refills | Status: AC

## 2023-06-29 MED ORDER — BREXPIPRAZOLE 2 MG PO TABS: 2.0000 mg | ORAL_TABLET | Freq: Every day | ORAL | 1 refills | Status: DC

## 2023-06-29 MED ORDER — CLONAZEPAM 0.5 MG PO TABS: ORAL_TABLET | ORAL | 2 refills | Status: AC
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
## 2023-06-30 ENCOUNTER — Telehealth: Payer: Self-pay | Admitting: Psychiatry

## 2023-06-30 NOTE — Telephone Encounter (Signed)
 Pt called on Friday 1/3 to [facility] for [place]. She stated that she can see the entire note that [facility] put in for 06/13/23. She is upset that this is there and not sure why it is there. She would like to discuss this b/c feels like it invades her privacy.

## 2023-07-03 NOTE — Telephone Encounter (Signed)
 Returned Patient call. She was upset her note was in her Mychart agreed to set it so that it would not appear in her Mychart as patient asked.

## 2023-07-04 DIAGNOSIS — R300 Dysuria: Secondary | ICD-10-CM | POA: Diagnosis not present

## 2023-07-04 DIAGNOSIS — Z9889 Other specified postprocedural states: Secondary | ICD-10-CM | POA: Diagnosis not present

## 2023-07-04 DIAGNOSIS — R350 Frequency of micturition: Secondary | ICD-10-CM | POA: Diagnosis not present

## 2023-07-05 ENCOUNTER — Ambulatory Visit: Payer: BC Managed Care – PPO | Admitting: Psychiatry

## 2023-07-05 DIAGNOSIS — F4310 Post-traumatic stress disorder, unspecified: Secondary | ICD-10-CM | POA: Diagnosis not present
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
## 2023-07-11 ENCOUNTER — Encounter: Payer: Self-pay | Admitting: Neurosurgery

## 2023-07-11 ENCOUNTER — Ambulatory Visit: Payer: BC Managed Care – PPO | Admitting: Neurosurgery

## 2023-07-11 VITALS — BP 106/74 | Ht 68.0 in | Wt 137.0 lb

## 2023-07-11 DIAGNOSIS — M48061 Spinal stenosis, lumbar region without neurogenic claudication: Secondary | ICD-10-CM

## 2023-07-11 DIAGNOSIS — G8929 Other chronic pain: Secondary | ICD-10-CM

## 2023-07-11 DIAGNOSIS — G894 Chronic pain syndrome: Secondary | ICD-10-CM

## 2023-07-11 DIAGNOSIS — M51369 Other intervertebral disc degeneration, lumbar region without mention of lumbar back pain or lower extremity pain: Secondary | ICD-10-CM

## 2023-07-11 DIAGNOSIS — M961 Postlaminectomy syndrome, not elsewhere classified: Secondary | ICD-10-CM | POA: Diagnosis not present

## 2023-07-11 DIAGNOSIS — Z981 Arthrodesis status: Secondary | ICD-10-CM

## 2023-07-11 NOTE — Progress Notes (Signed)
 Referring Physician:  No referring provider defined for this encounter.  Primary Physician:  Bernardo Fend, DO  History of Present Illness: 07/11/2023 Peggy Ruiz returns to see me.  She continues to have significant back pain.  She has thought the options over, would like to consider spinal cord stimulator evaluation.   05/09/2023 [name] is here today with a chief complaint of back and right leg pain.  This is well discussed below.  She has tried and failed conservative management.   The symptoms are causing a significant impact on the patient's life.   I have utilized the care everywhere function in epic to review the outside records available from external health systems.  History of Present Illness: Notes from Glade Boys PA-C ALAYASIA BREEDING is a 38 y.o. female has a history of cervical CA, PTSD, GERD, lyme disease, migraines, pelvic pain, hypothyroidism, FM, and chronic back pain.    She had hysterectomy for cervical CA on Friday 04/14/23.    History of lumbar fusion L5-S1 on 11/24/2016 Dr. Donnice Ruiz. This helped her right leg pain but she continued with chronic back pain.    Last seen by me on 03/28/23 for constant LBP and intermittent right leg pain. 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 She has some weakness in right foot with DF/PF/EHL.    Phone visit to review her lumbar MRI results.    She is doing well after her hysterectomy on Friday.    She continues with constant LBP with intermittent right anterior/lateral leg pain to her foot that is getting worse. Her right foot is numb when she has pain in the right leg- this is new. She has weakness in right foot as well. She still has numbness in right leg with walking. Pain is sharp and feels electrical. No left leg pain. Pain is worse with prolonged sitting/standing/walking. She is using a cane.    She was prescribed SUBUTEX from Dr. Bonner- has  not started it yet. Was on nycenta and insurance stopped covering it.    Bowel/Bladder Dysfunction: history of chronic urinary urgency (has seen urology), no bowel incontinence.    Conservative measures:  Physical therapy: did in last year at [facility] in [place] Multimodal medical therapy including regular antiinflammatories: flexeril , neurontin, subutex, nycenta Injections:  Had ESIs at [facility] in [place]- last was in August right L4 TF ESI on 12/23/21   Past Surgery:  revision L5-S1, L4-L5 discectomy and TLIF L5-S1 on 11/24/16.  She's had multiple other lumbar surgeries (2 discectomies?)  Review of Systems:  A 10 point review of systems is negative, except for the pertinent positives and negatives detailed in the HPI.  Past Medical History: Past Medical History:  Diagnosis Date   Anorexia    at 35   Anxiety    Attention deficit disorder (ADD)    Bone spur    Depression    Eczema    HPV (human papilloma virus) infection    Thyroid  disease     Past Surgical History: Past Surgical History:  Procedure Laterality Date   ABDOMINAL HYSTERECTOMY     BACK SURGERY     3 different surgeries   cervical cone ciopsy     EYE SURGERY     for lazy eye   GALLBLADDER SURGERY     HERNIA REPAIR      Allergies: Allergies as of 07/11/2023 - Review Complete 07/11/2023  Allergen Reaction Noted  Benzyl alcohol Hives and Other (See Comments) 06/25/2015   Benzoin Hives and Other (See Comments) 08/11/2022   Promethazine  02/11/2016   Scopolamine Rash 08/11/2022   Ciprofloxacin Other (See Comments) 02/07/2012   Dilaudid [hydromorphone]  04/07/2017   Epinephrine   02/25/2019   Penicillins Hives 02/07/2012   Toradol  [ketorolac  tromethamine ] Nausea And Vomiting 04/07/2017   Bactrim [sulfamethoxazole-trimethoprim] Rash 05/09/2023    Medications:  Current Outpatient Medications:    brexpiprazole  (REXULTI ) 2 MG TABS tablet, Take 1 tablet (2 mg total) by mouth daily., Disp:  90 tablet, Rfl: 1   buPROPion  (WELLBUTRIN  XL) 150 MG 24 hr tablet, Take 1 tablet (150 mg total) by mouth daily., Disp: 90 tablet, Rfl: 1   [START ON 07/13/2023] clonazePAM  (KLONOPIN ) 0.5 MG tablet, Take 1 tablet (0.5 mg total) by mouth 2 (two) times daily AND 2 tablets (1 mg total) at bedtime., Disp: 360 tablet, Rfl: 2   gabapentin (NEURONTIN) 800 MG tablet, Take 800 mg by mouth 2 (two) times daily., Disp: , Rfl:    lansoprazole (PREVACID) 30 MG capsule, Take 30 mg by mouth 2 (two) times daily before a meal., Disp: , Rfl:    levothyroxine  (SYNTHROID ) 50 MCG tablet, Take 1 tablet (50 mcg total) by mouth daily., Disp: 90 tablet, Rfl: 0   loratadine (CLARITIN) 10 MG tablet, Take by mouth daily as needed., Disp: , Rfl:    Magnesium Oxide, Laxative, 500 MG TABS, , Disp: , Rfl:    methocarbamol  (ROBAXIN ) 750 MG tablet, Take 1 tablet 3 times a day by oral route as needed for 30 days., Disp: , Rfl:    Multiple Vitamin (MULTIVITAMIN) tablet, Take 1 tablet by mouth daily., Disp: , Rfl:    nortriptyline  (PAMELOR ) 10 MG capsule, Take 1 capsule (10 mg total) by mouth at bedtime., Disp: 90 capsule, Rfl: 0   NUCYNTA 75 MG tablet, Take 75 mg by mouth every 6 (six) hours as needed., Disp: , Rfl:    ondansetron  (ZOFRAN -ODT) 8 MG disintegrating tablet, Take 8 mg by mouth every 8 (eight) hours as needed., Disp: , Rfl:    ursodiol (ACTIGALL) 300 MG capsule, Take 300 mg by mouth 2 (two) times daily., Disp: , Rfl:   Social History: Social History   Tobacco Use   Smoking status: Never   Smokeless tobacco: Never  Vaping Use   Vaping status: Never Used  Substance Use Topics   Alcohol use: Yes    Comment: occasion   Drug use: No    Family Medical History: Family History  Problem Relation Age of Onset   Fibromyalgia Mother    Anxiety disorder Mother    Depression Mother    Depression Father    Anxiety disorder Father     Physical Examination: Vitals:   07/11/23 1334  BP: 106/74    General: Patient is  in no apparent distress. Attention to examination is appropriate.  Neck:   Supple.  Full range of motion.  Respiratory: Patient is breathing without any difficulty.   NEUROLOGICAL:     Awake, alert, oriented to person, place, and time.  Speech is clear and fluent.   Cranial Nerves: Pupils equal round and reactive to light.  Facial tone is symmetric.  Facial sensation is symmetric. Shoulder shrug is symmetric. Tongue protrusion is midline.    Strength: Moves UE well  Side Iliopsoas Quads Hamstring PF DF EHL  R 5 5 5 5 5 5   L 5 5 5 5 5 5    Reflexes are 1+ and  symmetric at the biceps, triceps, brachioradialis, patella and achilles.   Hoffman's is absent.   Bilateral upper and lower extremity sensation is intact to light touch with some lowered sensation in RLE in L4 distribution.    No evidence of dysmetria noted.  Gait is antalgic.    SLR + on R at 45 degree   Medical Decision Making  Imaging: MRI L spine 04/01/2023 IMPRESSION: 1. Progressive adjacent level moderate right foraminal stenosis at L4-5. 2. Mild subarticular narrowing bilaterally at L4-5 is worse on the right. 3. Mild left foraminal narrowing at L4-5 is stable. 4. Fusion at L5-S1 without residual or recurrent stenosis. 5. Mild disc bulging at L2-3 and L3-4 without significant stenosis.     Electronically Signed   By: Lonni Necessary M.D.   On: 04/13/2023 15:24  Flex Ext Xrays 08/23/2022  IMPRESSION:  Postoperative changes, status post L5-S1 lumbosacral fusion.  Minimal retrolisthesis of L4 on L5, which appears mildly accentuated on lateral extension view.  Mild multilevel degenerative changes.    Electronically Signed by: Peggy Mains, MD on 08/26/2022 12:30 PM   Please note that I have measured her retrolisthesis at L4-5.  She has 6.3 mm of retrolisthesis on extension that reduces to approximately 1.7 mm on flexion.  I have personally reviewed the images and agree with the above  interpretation.  Assessment and Plan: Ms. Morici is a pleasant 38 y.o. 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  She has foraminal stenosis secondary to this.  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  She has tried and failed physical therapy.  At this point, we will consider a couple of different options.  1 option is an extension of fusion to L4 with either open or minimally invasive right sided transforaminal lumbar interbody fusion.   We had also discussed the option of a spinal cord stimulator.  She would like to give a spinal cord stimulator to try.  I will make a referral for thoracic MRI, psychology evaluation, and pain clinic referral.  I will be happy to see her back to discuss her trial results.  We discussed that it would be best for her to wean her narcotics prior to any surgical procedure, as there may be difficulty with controlling her pain postoperatively.   I spent a total of 10 minutes in this patient's care today. This time was spent reviewing pertinent records including imaging studies, obtaining and confirming history, performing a directed evaluation, formulating and discussing my recommendations, and documenting the visit within the medical record.    Thank you for involving me in the care of this patient.      Lyberti Thrush K. Clois MD, [facility]

## 2023-07-11 NOTE — Patient Instructions (Signed)
[facility] - televisits [phone] Not in network with [facility] - televisits [phone] Not in network with [facility] in [place] [phone] Accepts [facility] Dr Kieth Brightly in [place] [phone] Accepts [facility], but is typically booked out about 10 months Dr Orie Fisherman in [place] - also does televisits [phone] Dr Irish Lack in [place] [phone] Dr Mindi Slicker in [place] - also does televisits [phone] [facility] (offices in [place], [place], and [place]) [phone]   Please let us know which one of the above psychologist's you would like to see for evaluation prior to the spinal cord stimulator trial. These are the only providers we are aware of that perform this type of evaluation. Once we fax the referral, please call them to set up an appointment (they do not typically call you).

## 2023-07-12 ENCOUNTER — Encounter: Payer: Self-pay | Admitting: Neurosurgery

## 2023-07-12 NOTE — Telephone Encounter (Signed)
 Done

## 2023-07-13 ENCOUNTER — Ambulatory Visit: Payer: BC Managed Care – PPO | Admitting: Psychiatry

## 2023-07-14 DIAGNOSIS — R1032 Left lower quadrant pain: Secondary | ICD-10-CM | POA: Diagnosis not present

## 2023-07-14 DIAGNOSIS — R1033 Periumbilical pain: Secondary | ICD-10-CM | POA: Diagnosis not present

## 2023-07-14 DIAGNOSIS — K30 Functional dyspepsia: Secondary | ICD-10-CM | POA: Diagnosis not present

## 2023-07-14 DIAGNOSIS — K219 Gastro-esophageal reflux disease without esophagitis: Secondary | ICD-10-CM | POA: Diagnosis not present

## 2023-07-17 ENCOUNTER — Encounter: Payer: Self-pay | Admitting: Dermatology

## 2023-07-17 ENCOUNTER — Ambulatory Visit: Payer: BC Managed Care – PPO | Admitting: Dermatology

## 2023-07-17 DIAGNOSIS — N8312 Corpus luteum cyst of left ovary: Secondary | ICD-10-CM | POA: Diagnosis not present

## 2023-07-17 DIAGNOSIS — K5900 Constipation, unspecified: Secondary | ICD-10-CM | POA: Diagnosis not present

## 2023-07-17 DIAGNOSIS — L253 Unspecified contact dermatitis due to other chemical products: Secondary | ICD-10-CM

## 2023-07-17 DIAGNOSIS — R21 Rash and other nonspecific skin eruption: Secondary | ICD-10-CM | POA: Diagnosis not present

## 2023-07-17 NOTE — Progress Notes (Signed)
   New Patient Visit   Subjective  Peggy Ruiz is a 38 y.o. female who presents for the following: Patient having spinal stimulator placed, patient had abdominal/GYN surgery and broke out into significant rash 3+ days after surgery, and feels that she is allergic to the prep that was used. Neurosurgeon would like patient tested to prep materials they use before performing spinal stimulator surgery.   The following portions of the chart were reviewed this encounter and updated as appropriate: medications, allergies, medical history  Review of Systems:  No other skin or systemic complaints except as noted in HPI or Assessment and Plan.  Objective  Well appearing patient in no apparent distress; mood and affect are within normal limits.   A focused examination was performed of the following areas: the face and arms   Relevant exam findings are noted in the Assessment and Plan.            Assessment & Plan   Rash - hx of rash after surgery in locations where patient was prepped before surgery. Exam: Clear  Differential diagnosis:  Delayed allergic contact dermatitis  Treatment Plan: [employer] testing applied today. Chlorhexidine prep x 2 was placed on the R upper inner arm, and Loban 2 x 2 placed on the L upper inner arm. Patient to return to clinic in two days to have products removed, then again in one week for final results. Advised patient do not get area wet before her next appointment.   Return for appointment as scheduled.  [name], CMA, am acting as scribe for [name], MD .   Documentation: I have reviewed the above documentation for accuracy and completeness, and I agree with the above.  [name], MD

## 2023-07-17 NOTE — Patient Instructions (Signed)

## 2023-07-18 ENCOUNTER — Ambulatory Visit
Admission: RE | Admit: 2023-07-18 | Discharge: 2023-07-18 | Disposition: A | Discharge status: Home / Self Care | Payer: BC Managed Care – PPO | Source: Ambulatory Visit | Attending: Neurosurgery | Admitting: Neurosurgery

## 2023-07-18 DIAGNOSIS — Z981 Arthrodesis status: Secondary | ICD-10-CM

## 2023-07-18 DIAGNOSIS — G894 Chronic pain syndrome: Secondary | ICD-10-CM

## 2023-07-18 DIAGNOSIS — G8929 Other chronic pain: Secondary | ICD-10-CM

## 2023-07-18 DIAGNOSIS — M961 Postlaminectomy syndrome, not elsewhere classified: Secondary | ICD-10-CM

## 2023-07-19 ENCOUNTER — Ambulatory Visit (INDEPENDENT_AMBULATORY_CARE_PROVIDER_SITE_OTHER): Payer: BC Managed Care – PPO

## 2023-07-19 DIAGNOSIS — R21 Rash and other nonspecific skin eruption: Secondary | ICD-10-CM

## 2023-07-20 ENCOUNTER — Ambulatory Visit: Payer: Self-pay | Admitting: Podiatry

## 2023-07-20 NOTE — Progress Notes (Signed)
Patient here 07/19/2023 for two finn chamber test reading.   Patient showed no strong reactions to testing sites.   Photos taken, patient advised of instructions for the next four days and testing areas outlined with permanent black marker.   Dorathy Daft, RMA

## 2023-07-21 ENCOUNTER — Telehealth: Payer: Self-pay

## 2023-07-21 NOTE — Telephone Encounter (Signed)
Prior Authorization initiated and submitted to [facility] for Clonazepam 0.5 mg #360 for 90 day Approved effective 06/21/2023-07/20/2024 PA# [id number]

## 2023-07-24 ENCOUNTER — Ambulatory Visit: Payer: BC Managed Care – PPO | Admitting: Dermatology

## 2023-07-24 ENCOUNTER — Encounter: Payer: Self-pay | Admitting: Dermatology

## 2023-07-24 DIAGNOSIS — L235 Allergic contact dermatitis due to other chemical products: Secondary | ICD-10-CM | POA: Diagnosis not present

## 2023-07-24 DIAGNOSIS — Z7189 Other specified counseling: Secondary | ICD-10-CM

## 2023-07-24 NOTE — Patient Instructions (Signed)

## 2023-07-24 NOTE — Progress Notes (Signed)
   Follow-Up Visit   Subjective  Peggy Ruiz is a 38 y.o. female who presents for the following: 1 week [employer] testing follow up. Patient advises that area where chlorhexidine was placed at right upper arm raised up and was itchy and red Thursday and Friday then started to calm down. Area at left arm has shown no reaction. When patient saw nurse last Wednesday there was minimal reaction at right upper arm.   The patient has spots, moles and lesions to be evaluated, some may be new or changing and the patient may have concern these could be cancer.   The following portions of the chart were reviewed this encounter and updated as appropriate: medications, allergies, medical history  Review of Systems:  No other skin or systemic complaints except as noted in HPI or Assessment and Plan.  Objective  Well appearing patient in no apparent distress; mood and affect are within normal limits.    A focused examination was performed of the following areas: arms  Relevant exam findings are noted in the Assessment and Plan.    Assessment & Plan   Allergic contact dermatitis to chlorhexidine. Exam: eczematous dermatitis in 1 of 2 chlorhexidine test spots on right arm. No reaction on Loban 2      Plan: Added chlorhexidine to allergy list. Patient knows to avoid Loban 2 cleared for use. Sent message to Dr Myer Haff Patient will use home topical steroid BID on allergic contact dermatiits until smooth ALLERGIC DERMATITIS DUE TO OTHER CHEMICAL PRODUCT   COUNSELING AND COORDINATION OF CARE    Return if symptoms worsen or fail to improve.  Anise Salvo, RMA, am acting as scribe for Elie Goody, MD .   Documentation: I have reviewed the above documentation for accuracy and completeness, and I agree with the above.  Elie Goody, MD

## 2023-07-25 ENCOUNTER — Ambulatory Visit
Admission: RE | Admit: 2023-07-25 | Discharge: 2023-07-25 | Disposition: A | Discharge status: Home / Self Care | Payer: BC Managed Care – PPO | Source: Ambulatory Visit | Attending: Neurosurgery | Admitting: Neurosurgery

## 2023-07-25 ENCOUNTER — Ambulatory Visit: Payer: BC Managed Care – PPO | Admitting: Psychiatry

## 2023-07-25 DIAGNOSIS — M47812 Spondylosis without myelopathy or radiculopathy, cervical region: Secondary | ICD-10-CM | POA: Diagnosis not present

## 2023-07-25 DIAGNOSIS — M5124 Other intervertebral disc displacement, thoracic region: Secondary | ICD-10-CM | POA: Diagnosis not present

## 2023-07-25 DIAGNOSIS — F4310 Post-traumatic stress disorder, unspecified: Secondary | ICD-10-CM | POA: Diagnosis not present
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
## 2023-07-26 ENCOUNTER — Encounter: Payer: Self-pay | Admitting: Podiatry

## 2023-07-26 ENCOUNTER — Ambulatory Visit: Payer: BC Managed Care – PPO | Admitting: Podiatry

## 2023-07-26 VITALS — Ht 68.0 in | Wt 137.0 lb

## 2023-07-26 DIAGNOSIS — L608 Other nail disorders: Secondary | ICD-10-CM

## 2023-07-26 DIAGNOSIS — B351 Tinea unguium: Secondary | ICD-10-CM | POA: Diagnosis not present

## 2023-07-26 NOTE — Patient Instructions (Addendum)
More silicone pads (toe cap mini) can be purchased from:  [email or web address]   VISIT SUMMARY:  Today, we addressed your concerns about the thickened and difficult-to-cut toenails, particularly affecting your second toenails on both feet. We discussed the possible causes and treatment options for your condition.  YOUR PLAN:  -DYSTROPHIC TOENAILS: Dystrophic toenails are toenails that have become thickened and deformed, often due to repeated pressure and damage. In your case, this is likely caused by your long second toe. Today, we removed the nail plate in the office and sent the left second toenail for a biopsy to check for a fungal infection called onychomycosis. If the biopsy confirms onychomycosis, we may treat it with either oral Lamisil or topical Efinaconazole . We also provided silicone toe pads to help reduce pressure once your toes have healed and advised you on proper footwear to prevent further damage. We will follow up in 6 months to see how you are doing.  INSTRUCTIONS:  Please follow up in 6 months for re-evaluation. In the meantime, use the silicone toe pads as directed once your toes have healed and wear appropriate shoes to prevent further damage. If the biopsy confirms a fungal infection, we will discuss treatment options with you.

## 2023-07-27 ENCOUNTER — Ambulatory Visit: Payer: BC Managed Care – PPO | Admitting: Psychiatry

## 2023-07-27 ENCOUNTER — Ambulatory Visit: Payer: BC Managed Care – PPO | Admitting: Podiatry

## 2023-07-27 ENCOUNTER — Encounter: Payer: Self-pay | Admitting: Podiatry

## 2023-07-27 DIAGNOSIS — L608 Other nail disorders: Secondary | ICD-10-CM | POA: Diagnosis not present

## 2023-07-27 DIAGNOSIS — B351 Tinea unguium: Secondary | ICD-10-CM | POA: Diagnosis not present

## 2023-07-27 NOTE — Progress Notes (Signed)
  Subjective:  Patient ID: Peggy Ruiz, female    DOB: 1985-12-19,  MRN: [id number]  Chief Complaint  Patient presents with   Nail Problem    Pt is here due to toe issues on both feet, she states that the toenail on her left foot is hard yellow and thick, and that the second toenail on the right foot is starting to look like the toe on the left foot.    Discussed the use of AI scribe software for clinical note transcription with the patient, who gave verbal consent to proceed.  History of Present Illness   The patient presents with thickened and difficult-to-cut toenails, particularly affecting the second toenails bilaterally.  She has been experiencing thickened and difficult-to-cut toenails, particularly affecting the second toenails bilaterally, for over a year. The left second toenail has been particularly problematic for about two years, described as 'pushing out' with noticeable thickening and layering. The right second toenail is beginning to exhibit similar symptoms. She attributes the condition to a long second toe causing pressure and damage, leading to the current state. She has not sought treatment earlier due to undergoing three surgeries in the past year, including a cancer scare.  She is concerned about the aesthetic implications of toenail removal, particularly as a female who enjoys pedicures. Her father had a similar toenail issue and had his toenail removed without pain, which reassures her about the procedure.  Her past medical history includes spinal fusion surgery and issues with epinephrine in dental procedures, which caused her heart rate to increase. No allergies to lidocaine, Marcaine, or Novocaine are reported.          Objective:    Physical Exam   CARDIOVASCULAR: Pulses palpable in the feet. SKIN: Toes warm and well perfused with good capillary fill time. Significant dystrophy and retronychia of the bilateral second toenails, left worse than right.  Discoloration, cervical debris, and mucous observed.       No images are attached to the encounter.    Results   Procedure: Bilateral second toenail removal Description: Cold spray applied to anesthetize the skin. Digital block made with 1cc each of lidocaine and marcaine per toe. Rubber band tourniquet placed around the toes. Bilateral second toenails removed. Left toenail sent to the lab for fungal evaluation. Informed Consent: Discussed risks, benefits, and alternatives of toenail removal. Explained the procedure, healing time, and post-procedure care. Patient agreed to proceed.      Assessment:   1. Nail discoloration   2. Onychomycosis      Plan:  Patient was evaluated and treated and all questions answered.  Assessment and Plan    Dystrophic Toenails Significant dystrophy and retronychia of the bilateral second toenails, likely due to long second toe causing repeated pressure and damage. Possible onychomycosis. -Remove nail plate in office today. -Send left second toenail for biopsy and evaluation for onychomycosis. -If onychomycosis is confirmed, consider treatment with oral Lamisil or topical Fenoconazole. -Dispense silicone toe pads for offloading once appropriate healing has occurred. -Advise on appropriate shoe gear to prevent further damage. -Follow-up in 6 months to re-evaluate.          Return in about 6 months (around 01/23/2024) for follow up after nail removal .

## 2023-07-27 NOTE — Patient Instructions (Signed)

## 2023-07-27 NOTE — Progress Notes (Signed)
Subjective: LAQUANA VILLARI is a 38 y.o.  female returns to office today for follow up evaluation after having bilateral 2nd total nail avulsion performed. Patient has been soaking using epsom salt and applying topical antibiotic covered with bandaid daily. Patient denies fevers, chills, nausea, vomiting. Denies any calf pain, chest pain, SOB.   Objective:  Vitals: Reviewed  General: Well developed, nourished, in no acute distress, alert and oriented x3   Dermatology: Skin is warm, dry and supple bilateral. bilateral 2nd nail bed appears to be clean, dry, with mild granular tissue and surrounding scab. There is no surrounding erythema, edema, drainage/purulence. The remaining nails appear unremarkable at this time. There are no other lesions or other signs of infection present.  Neurovascular status: Intact. No lower extremity swelling; No pain with calf compression bilateral.  Musculoskeletal: Decreased tenderness to palpation of the bilateral 2nd nail bed. Muscular strength within normal limits bilateral.   Assesement and Plan: S/p total nail avulsion, doing well.   -Continue soaking in epsom salts twice a day followed by antibiotic ointment and a band-aid. Can leave uncovered at night. Continue this until completely healed.  -If the area has not healed in 2 weeks, call the office for follow-up appointment, or sooner if any problems arise.  -Monitor for any signs/symptoms of infection. Call the office immediately if any occur or go directly to the emergency room. Call with any questions/concerns.  Nicholes Rough, DPM

## 2023-07-31 DIAGNOSIS — F4312 Post-traumatic stress disorder, chronic: Secondary | ICD-10-CM | POA: Diagnosis not present

## 2023-07-31 DIAGNOSIS — F411 Generalized anxiety disorder: Secondary | ICD-10-CM | POA: Diagnosis not present

## 2023-07-31 DIAGNOSIS — F320 Major depressive disorder, single episode, mild: Secondary | ICD-10-CM | POA: Diagnosis not present

## 2023-08-01 ENCOUNTER — Encounter: Payer: Self-pay | Admitting: Podiatry

## 2023-08-01 NOTE — Addendum Note (Signed)
Addended byLilian Kapur, Adalynne Steffensmeier R on: 08/01/2023 12:59 PM   Modules accepted: Orders

## 2023-08-02 MED ORDER — ITRACONAZOLE 100 MG PO CAPS: 200.0000 mg | ORAL_CAPSULE | Freq: Every day | ORAL | 0 refills | Status: AC

## 2023-08-03 ENCOUNTER — Encounter: Payer: Self-pay | Admitting: Neurosurgery

## 2023-08-04 DIAGNOSIS — K648 Other hemorrhoids: Secondary | ICD-10-CM | POA: Diagnosis not present

## 2023-08-04 DIAGNOSIS — R1030 Lower abdominal pain, unspecified: Secondary | ICD-10-CM | POA: Diagnosis not present

## 2023-08-08 ENCOUNTER — Ambulatory Visit: Payer: BC Managed Care – PPO | Admitting: Psychiatry

## 2023-08-08 DIAGNOSIS — F4310 Post-traumatic stress disorder, unspecified: Secondary | ICD-10-CM

## 2023-08-08 NOTE — Progress Notes (Signed)
[facility] Counselor/Therapist Progress Note  Patient ID: Peggy Ruiz, MRN: [id number],    Date: 08/08/2023  Time Spent: 50 minutes start time 12:08 PM end time 12:58 PM Virtual Visit via Video Note Connected with patient by a telemedicine/telehealth application, with their informed consent, and verified patient privacy and that I am speaking with the correct person using two identifiers. I discussed the limitations, risks, security and privacy concerns of performing psychotherapy and the availability of in person appointments. I also discussed with the patient that there may be a patient responsible charge related to this service. The patient expressed understanding and agreed to proceed. I discussed the treatment planning with the patient. The patient was provided an opportunity to ask questions and all were answered. The patient agreed with the plan and demonstrated an understanding of the instructions. The patient was advised to call  our office if  symptoms worsen or feel they are in a crisis state and need immediate contact.   Therapist Location: home Patient Location: home    Treatment Type: Individual Therapy  Reported Symptoms: health issues, sadness, anxiety, pain issues, triggered responses, fatigue, crying spelss  Mental Status Exam:  Appearance:   Well Groomed     Behavior:  Appropriate  Motor:  Normal  Speech/Language:   Normal Rate  Affect:  Appropriate  Mood:  sad  Thought process:  normal  Thought content:    WNL  Sensory/Perceptual disturbances:    WNL  Orientation:  oriented to person, place, time/date, and situation  Attention:  Good  Concentration:  Good  Memory:  WNL  Fund of knowledge:   Good  Insight:    Good  Judgment:   Good  Impulse Control:  Good   Risk Assessment: Danger to Self:  No Self-injurious Behavior: No Danger to Others: No Duty to Warn:no Physical Aggression / Violence:No  Access to Firearms a concern: No  Gang  Involvement:No   Subjective: Met with patient via virtual session. She shared that they were able to get her parents moved back into their own home. She went on to share she was feeling guilty due to her body not being able to handle them being there any longer. She shared that she did do it for 9 weeks. She shared she cried for days about the situation.  She shared she has felt guilty, sad and lonely. She did have a colonoscopy and it was hard for her and nothing came up for her. She went on to share that she is still working with her doctor on the issue due to no answers as to what is going on with her. She went on to share it has taken her back to when she was 7 and they were running tests due to stomach issues and they said it was stress.  Patient explained she is fearful that nothing more of her change and nothing will ever be found even though the doctor knows there is something wrong by the way she is responding to food and her stomach cramping.  Patient is also anxious about an upcoming Surgery.  Patient was was encouraged to realize that despite all of her health stuff and things not going the way she would like them to she still has a purpose and she has to figure out what that is.  Patient was able to acknowledge that she has started journaling and she is hopeful that that will lead to her being able to help others in some way.  Encouraged her to continue doing that and to continue focusing on finding positive ways to keep her brain engaged.  Interventions: Cognitive Behavioral Therapy and Solution-Oriented/Positive Psychology  Diagnosis:   ICD-10-CM   1. PTSD (post-traumatic stress disorder)  F43.10       Plan: Patient is to use CBT and coping skills to decrease triggered responses.  Patient is to follow plans from session to work on finding her progress..  Patient is to remind herself of all the progress she has made in treatment and to journal her insights.  Patient is to focus on what she  can control fix and change she is to start writing them down and reading in them as she needs to.  Patient is to work on affirming herself regularly that she is safe and thinking through what to do to help her feel safe.  Patient is to start looking into the possibility of a service dog.  Patient is to work on Psychologist, sport and exercise on her mirror to repeat regularly.  Patient is to take things in small steps so that she is able to accomplish tasks.  Patient is to take medication as directed.   Patient is to work on focusing on the things that she can accomplish.  Patient is to work on Dr. Rayfield Citizen leaf on the neuro cycle detox.  Patient is to continue working with providers on medical issues   Stevphen Meuse, [facility]

## 2023-08-09 ENCOUNTER — Ambulatory Visit: Payer: BC Managed Care – PPO | Admitting: Psychiatry

## 2023-08-10 ENCOUNTER — Ambulatory Visit
Payer: BC Managed Care – PPO | Attending: Student in an Organized Health Care Education/Training Program | Admitting: Student in an Organized Health Care Education/Training Program

## 2023-08-10 ENCOUNTER — Encounter: Payer: Self-pay | Admitting: Student in an Organized Health Care Education/Training Program

## 2023-08-10 ENCOUNTER — Ambulatory Visit
Admission: RE | Admit: 2023-08-10 | Discharge: 2023-08-10 | Disposition: A | Discharge status: Home / Self Care | Payer: BC Managed Care – PPO | Source: Ambulatory Visit | Attending: Student in an Organized Health Care Education/Training Program | Admitting: Student in an Organized Health Care Education/Training Program

## 2023-08-10 VITALS — BP 108/74 | HR 97 | Temp 98.4°F | Resp 16 | Ht 68.0 in | Wt 130.0 lb

## 2023-08-10 DIAGNOSIS — Z981 Arthrodesis status: Secondary | ICD-10-CM | POA: Diagnosis not present

## 2023-08-10 DIAGNOSIS — M5416 Radiculopathy, lumbar region: Secondary | ICD-10-CM | POA: Insufficient documentation

## 2023-08-10 DIAGNOSIS — M961 Postlaminectomy syndrome, not elsewhere classified: Secondary | ICD-10-CM | POA: Insufficient documentation

## 2023-08-10 DIAGNOSIS — G8929 Other chronic pain: Secondary | ICD-10-CM | POA: Diagnosis not present

## 2023-08-10 NOTE — Progress Notes (Signed)
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 pre-charting review of records, searching for previously ordered imaging, lab work, and nerve conduction tests) Review of prior analgesic pharmacotherapies. Reviewing PMP Interpreting ordered tests (e.g., lab work, imaging, nerve conduction tests) Performing post-procedure evaluations, including interpretation of diagnostic procedures Obtaining and/or reviewing separately obtained history Performing a medically appropriate examination and/or evaluation Counseling and educating the patient/family/caregiver Ordering medications, tests, or procedures Referring and communicating with other health care professionals (when not separately reported) Documenting clinical information in the electronic or other health record Independently interpreting  results (not separately reported) and communicating results to the patient/ family/caregiver Care coordination (not separately reported)  Note by: Edward Jolly, MD (AI and TTS technology used. I apologize for any typographical errors that were not detected and corrected.) Date: 08/10/2023; Time: 9:12 AM

## 2023-08-10 NOTE — Progress Notes (Signed)
Safety precautions to be maintained throughout the outpatient stay will include: orient to surroundings, keep bed in low position, maintain call bell within reach at all times, provide assistance with transfer out of bed and ambulation.

## 2023-08-10 NOTE — Patient Instructions (Signed)

## 2023-08-14 ENCOUNTER — Encounter: Payer: Self-pay | Admitting: Neurosurgery

## 2023-08-14 DIAGNOSIS — M961 Postlaminectomy syndrome, not elsewhere classified: Secondary | ICD-10-CM | POA: Diagnosis not present

## 2023-08-14 DIAGNOSIS — M5412 Radiculopathy, cervical region: Secondary | ICD-10-CM | POA: Diagnosis not present

## 2023-08-14 DIAGNOSIS — M5416 Radiculopathy, lumbar region: Secondary | ICD-10-CM | POA: Diagnosis not present

## 2023-08-16 ENCOUNTER — Encounter: Payer: Self-pay | Admitting: Student in an Organized Health Care Education/Training Program

## 2023-08-16 ENCOUNTER — Encounter: Payer: Self-pay | Admitting: Internal Medicine

## 2023-08-23 ENCOUNTER — Ambulatory Visit: Payer: BC Managed Care – PPO | Admitting: Psychiatry

## 2023-08-23 DIAGNOSIS — F4310 Post-traumatic stress disorder, unspecified: Secondary | ICD-10-CM | POA: Diagnosis not present

## 2023-08-23 NOTE — Progress Notes (Unsigned)
 [facility] Counselor/Therapist Progress Note  Patient ID: MONSERAT PRESTIGIACOMO, MRN: [id number],    Date: 08/23/2023  Time Spent: 49 minutes start time 11:09 AM  Treatment Type: Individual Therapy  Reported Symptoms: anxiety, chronic pain, triggered responses, sadness, health issues, sleep issues, health issues,fatigue, restlessness, rumination  Mental Status Exam:  Appearance:   Well Groomed     Behavior:  Appropriate  Motor:  Normal  Speech/Language:   Normal Rate  Affect:  Appropriate  Mood:  anxious  Thought process:  normal  Thought content:    WNL  Sensory/Perceptual disturbances:    WNL  Orientation:  oriented to person, place, time/date, and situation  Attention:  Good  Concentration:  Good  Memory:  WNL  Fund of knowledge:   Good  Insight:    Good  Judgment:   Good  Impulse Control:  Good   Risk Assessment: Danger to Self:  No Self-injurious Behavior: No Danger to Others: No Duty to Warn:no Physical Aggression / Violence:No  Access to Firearms a concern: No  Gang Involvement:No   Subjective:  Patient was present for session. She shared it was confirmed that she is going to try a spinal cord stimulator on her back instead of back surgery due to the other issues with her spine. She shared she is on a new medication to try and help the issues with her stomach but it is not working . She is wondering if the medicine to get rid of the mold in her toenail is the issue creating her being jittery.She shared has has been journaling. She shared her mom is really depressed,  which is upsetting her due to mom having past suicide attempts. Discussed what she can do to help her mom and dad. She is to talk to dad about locking up the medication since that is what she had used in the past to harm self. She is also to send her positive or funny messages everyday. Patient was able to think of some things that are different now in her mom's life and realize she has to focus on what she  can control fix and change. Patient went on to share that she has reconnected with a friend and that has helped her mood. She has gotten back to journaling, reading "Hope is the First Dose", and joined a small group so it has helped her mood.  Patient was encouraged to continue doing those things that are helping with her mood to continue working with her physicians on her health issues and follow-up plans with mom.  Interventions: Solution-Oriented/Positive Psychology and Insight-Oriented  Diagnosis:   ICD-10-CM   1. PTSD (post-traumatic stress disorder)  F43.10       Plan:  Patient is to use CBT and coping skills to decrease triggered responses.  Patient is to follow plans from session to talk to dad about looking at all the medications from mom and him, send mom funny and positive messages daily, and continue engaging in social activities that are helping her.  Patient is to remind herself of all the progress she has made in treatment and to journal her insights.  Patient is to focus on what she can control fix and change she is to start writing them down and reading in them as she needs to.  Patient is to work on affirming herself regularly that she is safe and thinking through what to do to help her feel safe.  Patient is to start looking into the possibility  of a service dog.  Patient is to work on Psychologist, sport and exercise on her mirror to repeat regularly.  Patient is to take things in small steps so that she is able to accomplish tasks.  Patient is to take medication as directed.   Patient is to work on focusing on the things that she can accomplish.  Patient is to work on Dr. Rayfield Citizen leaf on the neuro cycle detox.  Patient is to continue working with providers on medical issues   Stevphen Meuse, [facility]

## 2023-08-24 ENCOUNTER — Encounter: Payer: Self-pay | Admitting: Student in an Organized Health Care Education/Training Program

## 2023-08-24 ENCOUNTER — Encounter: Payer: Self-pay | Admitting: Internal Medicine

## 2023-08-24 ENCOUNTER — Ambulatory Visit: Payer: BC Managed Care – PPO | Admitting: Internal Medicine

## 2023-08-24 ENCOUNTER — Other Ambulatory Visit: Payer: Self-pay

## 2023-08-24 VITALS — BP 110/70 | HR 100 | Temp 97.9°F | Resp 16 | Ht 68.0 in | Wt 133.4 lb

## 2023-08-24 DIAGNOSIS — Z13220 Encounter for screening for lipoid disorders: Secondary | ICD-10-CM

## 2023-08-24 DIAGNOSIS — K3184 Gastroparesis: Secondary | ICD-10-CM | POA: Insufficient documentation

## 2023-08-24 DIAGNOSIS — M48061 Spinal stenosis, lumbar region without neurogenic claudication: Secondary | ICD-10-CM | POA: Insufficient documentation

## 2023-08-24 DIAGNOSIS — E039 Hypothyroidism, unspecified: Secondary | ICD-10-CM | POA: Diagnosis not present

## 2023-08-24 DIAGNOSIS — Z87898 Personal history of other specified conditions: Secondary | ICD-10-CM | POA: Diagnosis not present

## 2023-08-24 DIAGNOSIS — R7309 Other abnormal glucose: Secondary | ICD-10-CM | POA: Diagnosis not present

## 2023-08-24 DIAGNOSIS — Z1159 Encounter for screening for other viral diseases: Secondary | ICD-10-CM | POA: Diagnosis not present

## 2023-08-24 DIAGNOSIS — C539 Malignant neoplasm of cervix uteri, unspecified: Secondary | ICD-10-CM | POA: Insufficient documentation

## 2023-08-24 DIAGNOSIS — R232 Flushing: Secondary | ICD-10-CM

## 2023-08-24 DIAGNOSIS — Z114 Encounter for screening for human immunodeficiency virus [HIV]: Secondary | ICD-10-CM

## 2023-08-24 NOTE — Assessment & Plan Note (Signed)
 Worsening along with IBS symptoms, following with GI and on new medication.

## 2023-08-24 NOTE — Assessment & Plan Note (Signed)
 Recheck A1c, patient has lost a significant amount of weight since her last pre-diabetic A1c but does have a strong family history of diabetes.

## 2023-08-24 NOTE — Assessment & Plan Note (Signed)
 Recheck TSH

## 2023-08-24 NOTE — Assessment & Plan Note (Signed)
 Following with Gynecology, planning on Pap over the summer.

## 2023-08-24 NOTE — Progress Notes (Signed)
 Established Patient Office Visit  Subjective    Patient ID: Peggy Ruiz, female    DOB: [birth year]  Age: 38 y.o. MRN: [id number]  CC:  Chief Complaint  Patient presents with   Medical Management of Chronic Issues    3 mnths recheck    HPI Peggy Ruiz presents to follow up on chronic medical conditions. Since her last office visit she has been continuing to have severe IBS - diarrhea and constipation. Her GI ordered a CT and found an complex left ovarian cyst, patient has [place] scheduled for next week. She does have some breast tenderness, trouble sleeping and hot flashes, would like checked for peri-menopause.   MDD: -Seeing psychiatric NP at [facility], following her to new practice  -Mood status: stable -Current treatment: Rexulti 2 mg, Wellbutrin XL 150 mg, Klonopin 0.5 mg 4 times daily -Satisfied with current treatment?: yes -Duration of current treatment : chronic -Side effects: no Medication compliance: excellent compliance     08/24/2023   11:25 AM 08/10/2023    8:02 AM 06/05/2023    1:10 PM  Depression screen PHQ 2/9  Decreased Interest 0 0 0  Down, Depressed, Hopeless 1 1 1   PHQ - 2 Score 1 1 1   Altered sleeping 0  0  Tired, decreased energy 1  2  Change in appetite 1  1  Feeling bad or failure about yourself  1  2  Trouble concentrating 0  0  Moving slowly or fidgety/restless 0  0  Suicidal thoughts 0  0  PHQ-9 Score 4  6  Difficult doing work/chores Not difficult at all     Hypothyroidism: -Medications: Levothyroxine 50 mcg -Patient is compliant with the above medication (s) at the above dose and reports no medication side effects.  -Denies weight changes, cold./heat intolerance, skin changes, anxiety/palpitations  -Last TSH: 2/24 1.7710, TPO antibody negative in the past when she was first diagnosed in 2019  Hx of Adenocarcinoma in situ of the cervix: -S/p robotic assisted hysterectomy with bilateral salpingectomy plus sentinel lymph node dissection on  April 14, 2023. No invasive cancer after pathology evaluation -Still following with surgery at [facility] -Patient had a complication after surgery of having an allergic reaction to the Hibiclens used to clean her skin and vagina.  Patient was having severe itching and ended up being treated with steroids which then led her to have to UTIs.  She just finished her second round of antibiotics yesterday and is currently denying urinary symptoms. -Allergy of hibiclens, treated with steroid, then had 2 UTI's, just finished antibiotic yesterday   Gastroparesis/IBS:  -Following with GI, Dr. Donivan Scull at [facility] Last seen 04/03/23 -Currently on Prevacid 30 mg, magesium citrate, Nortriptyline 10 mg, Usodiol 300 mg BID for bile production and Zofran PRN. Was recently started on a new medication Ibsrela, but hasn't noticed any changes in symptoms yet  Lumbar spinal stenosis: -Patient has a pre-existing history of L5-S1 fusion in 2018 but was having an exacerbation of symptoms and pain -Was just seen by Dr. Marcell Barlow on 05/09/2023, now planning potential extension of fusion to L4 but patient cannot undergo this procedure until March due to home situation -She does follow with Dr. Ethelene Hal, orthopedic surgeon at [facility] in [place] who is treating her pain with Nucynta 75 mgm, Gabapentin 800 mg BID -Pain has been worsening lately, planning on getting a spinal cord stimulator inserted but waiting for insurance approval.   Health Maintenance: -Blood work due -Just underwent hysterectomy - having  Paps yearly for the next few years. First one June 2025.   Outpatient Encounter Medications as of 08/24/2023  Medication Sig   brexpiprazole (REXULTI) 2 MG TABS tablet Take 1 tablet (2 mg total) by mouth daily.   buPROPion (WELLBUTRIN XL) 150 MG 24 hr tablet Take 1 tablet (150 mg total) by mouth daily.   clonazePAM (KLONOPIN) 0.5 MG tablet Take 1 tablet (0.5 mg total) by mouth 2 (two) times daily AND 2 tablets  (1 mg total) at bedtime.   gabapentin (NEURONTIN) 800 MG tablet Take 800 mg by mouth 2 (two) times daily.   itraconazole (SPORANOX) 100 MG capsule Take 2 capsules (200 mg total) by mouth daily.   lansoprazole (PREVACID) 30 MG capsule Take 30 mg by mouth 2 (two) times daily before a meal.   levothyroxine (SYNTHROID) 50 MCG tablet Take 1 tablet (50 mcg total) by mouth daily.   loratadine (CLARITIN) 10 MG tablet Take by mouth daily as needed.   Magnesium Oxide, Laxative, 500 MG TABS    methocarbamol (ROBAXIN) 750 MG tablet Take 1 tablet 3 times a day by oral route as needed for 30 days.   Multiple Vitamin (MULTIVITAMIN) tablet Take 1 tablet by mouth daily.   nortriptyline (PAMELOR) 10 MG capsule Take 1 capsule (10 mg total) by mouth at bedtime.   NUCYNTA 75 MG tablet Take 75 mg by mouth every 6 (six) hours as needed.   ondansetron (ZOFRAN-ODT) 8 MG disintegrating tablet Take 8 mg by mouth every 8 (eight) hours as needed.   Tenapanor HCl (IBSRELA) 50 MG TABS Take by mouth.   ursodiol (ACTIGALL) 300 MG capsule Take 300 mg by mouth 2 (two) times daily.   No facility-administered encounter medications on file as of 08/24/2023.    Past Medical History:  Diagnosis Date   Anorexia    at 49   Anxiety    Attention deficit disorder (ADD)    Bone spur    Depression    Eczema    HPV (human papilloma virus) infection    Thyroid disease     Past Surgical History:  Procedure Laterality Date   ABDOMINAL HYSTERECTOMY     BACK SURGERY     3 different surgeries   cervical cone ciopsy     EYE SURGERY     for lazy eye   GALLBLADDER SURGERY     HERNIA REPAIR      Family History  Problem Relation Age of Onset   Fibromyalgia Mother    Anxiety disorder Mother    Depression Mother    Depression Father    Anxiety disorder Father     Social History   Socioeconomic History   Marital status: Married    Spouse name: Not on file   Number of children: Not on file   Years of education: Not on  file   Highest education level: Bachelor's degree (e.g., BA, AB, BS)  Occupational History   Not on file  Tobacco Use   Smoking status: Never   Smokeless tobacco: Never  Vaping Use   Vaping status: Never Used  Substance and Sexual Activity   Alcohol use: Yes    Comment: occasion   Drug use: No   Sexual activity: Never    Birth control/protection: Surgical  Other Topics Concern   Not on file  Social History Narrative   Regular exercise: noneCaffeine use: daily. Recently moved from [place], [place]   Social Drivers of Health   Financial Resource Strain: Low Risk  (08/22/2023)  Overall Financial Resource Strain (CARDIA)    Difficulty of Paying Living Expenses: Not hard at all  Food Insecurity: No Food Insecurity (08/22/2023)   Hunger Vital Sign    Worried About Running Out of Food in the Last Year: Never true    Ran Out of Food in the Last Year: Never true  Transportation Needs: No Transportation Needs (08/22/2023)   PRAPARE - Administrator, Civil Service (Medical): No    Lack of Transportation (Non-Medical): No  Physical Activity: Unknown (08/22/2023)   Exercise Vital Sign    Days of Exercise per Week: 0 days    Minutes of Exercise per Session: Not on file  Stress: No Stress Concern Present (08/22/2023)   [employer] of Occupational Health - Occupational Stress Questionnaire    Feeling of Stress : Only a little  Social Connections: Socially Integrated (08/22/2023)   Social Connection and Isolation Panel [NHANES]    Frequency of Communication with Friends and Family: Three times a week    Frequency of Social Gatherings with Friends and Family: Once a week    Attends Religious Services: More than 4 times per year    Active Member of [employer] or Organizations: Yes    Attends Engineer, structural: More than 4 times per year    Marital Status: Married  Catering manager Violence: Not At Risk (04/03/2023)   Received from [facility]   HITS    Over the last  12 months how often did your partner physically hurt you?: Never    Over the last 12 months how often did your partner insult you or talk down to you?: Never    Over the last 12 months how often did your partner threaten you with physical harm?: Never    Over the last 12 months how often did your partner scream or curse at you?: Never    Review of Systems  Constitutional:  Positive for malaise/fatigue.  Psychiatric/Behavioral:  The patient has insomnia.         Objective    BP 110/70 (Cuff Size: Normal)   Pulse 100   Temp 97.9 F (36.6 C) (Oral)   Resp 16   Ht 5\' 8"  (1.727 m)   Wt 133 lb 6.4 oz (60.5 kg)   LMP 03/02/2017 (Exact Date)   SpO2 98%   BMI 20.28 kg/m   Physical Exam Constitutional:      Appearance: Normal appearance.  HENT:     Head: Normocephalic and atraumatic.  Eyes:     Conjunctiva/sclera: Conjunctivae normal.  Cardiovascular:     Rate and Rhythm: Normal rate and regular rhythm.  Pulmonary:     Effort: Pulmonary effort is normal.     Breath sounds: Normal breath sounds.  Skin:    General: Skin is warm and dry.  Neurological:     General: No focal deficit present.     Mental Status: She is alert. Mental status is at baseline.  Psychiatric:        Mood and Affect: Mood normal.        Behavior: Behavior normal.         Assessment & Plan:   Hypothyroidism, unspecified type Assessment & Plan: Recheck TSH.  Orders: -     TSH  History of prediabetes Assessment & Plan: Recheck A1c, patient has lost a significant amount of weight since her last pre-diabetic A1c but does have a strong family history of diabetes.   Orders: -     Hemoglobin  A1c  Gastroparesis Assessment & Plan: Worsening along with IBS symptoms, following with GI and on new medication.  Orders: -     CBC with Differential/Platelet -     COMPLETE METABOLIC PANEL WITH GFR  Spinal stenosis of lumbar region, unspecified whether neurogenic claudication present Assessment &  Plan: Following with Neurosurgery and pain management, waiting for insurance to approve spinal cord stimulator.   Orders: -     CBC with Differential/Platelet -     COMPLETE METABOLIC PANEL WITH GFR  Lipid screening -     Lipid panel  Screening for HIV (human immunodeficiency virus) -     HIV Antibody (routine testing w rflx)  Need for hepatitis C screening test -     Hepatitis C antibody  Hot flashes -     FSH/LH  Adenocarcinoma of cervix [facility]) Assessment & Plan: Following with Gynecology, planning on Pap over the summer.    Screening labs ordered, FSH/LH ordered as well. Briefly discussed treating with black cohosh and or Effexor if appropriate in the future.   Return in about 6 months (around 02/21/2024).   Margarita Mail, DO

## 2023-08-24 NOTE — Assessment & Plan Note (Signed)
 Following with Neurosurgery and pain management, waiting for insurance to approve spinal cord stimulator.

## 2023-08-25 ENCOUNTER — Encounter: Payer: Self-pay | Admitting: Internal Medicine

## 2023-08-25 DIAGNOSIS — F411 Generalized anxiety disorder: Secondary | ICD-10-CM | POA: Diagnosis not present

## 2023-08-25 DIAGNOSIS — F4310 Post-traumatic stress disorder, unspecified: Secondary | ICD-10-CM | POA: Diagnosis not present

## 2023-08-25 DIAGNOSIS — F32A Depression, unspecified: Secondary | ICD-10-CM | POA: Diagnosis not present

## 2023-08-25 LAB — COMPLETE METABOLIC PANEL WITH GFR
AG Ratio: 2 (calc) (ref 1.0–2.5)
ALT: 16 U/L (ref 6–29)
AST: 22 U/L (ref 10–30)
Albumin: 4.5 g/dL (ref 3.6–5.1)
Alkaline phosphatase (APISO): 45 U/L (ref 31–125)
BUN: 15 mg/dL (ref 7–25)
CO2: 30 mmol/L (ref 20–32)
Calcium: 9.4 mg/dL (ref 8.6–10.2)
Chloride: 102 mmol/L (ref 98–110)
Creat: 0.85 mg/dL (ref 0.50–0.97)
Globulin: 2.2 g/dL (ref 1.9–3.7)
Glucose, Bld: 96 mg/dL (ref 65–99)
Potassium: 4.3 mmol/L (ref 3.5–5.3)
Sodium: 138 mmol/L (ref 135–146)
Total Bilirubin: 0.4 mg/dL (ref 0.2–1.2)
Total Protein: 6.7 g/dL (ref 6.1–8.1)
eGFR: 90 mL/min/{1.73_m2} (ref >=60)

## 2023-08-25 LAB — CBC WITH DIFFERENTIAL/PLATELET
Absolute Lymphocytes: 898 {cells}/uL (ref 850–3900)
Absolute Monocytes: 332 {cells}/uL (ref 200–950)
Basophils Absolute: 21 {cells}/uL (ref 0–200)
Basophils Relative: 0.5 %
Eosinophils Absolute: 29 {cells}/uL (ref 15–500)
Eosinophils Relative: 0.7 %
HCT: 37 % (ref 35.0–45.0)
Hemoglobin: 12.4 g/dL (ref 11.7–15.5)
MCH: 31.8 pg (ref 27.0–33.0)
MCHC: 33.5 g/dL (ref 32.0–36.0)
MCV: 94.9 fL (ref 80.0–100.0)
MPV: 10.3 fL (ref 7.5–12.5)
Monocytes Relative: 8.1 %
Neutro Abs: 2821 {cells}/uL (ref 1500–7800)
Neutrophils Relative %: 68.8 %
Platelets: 241 10*3/uL (ref 140–400)
RBC: 3.9 10*6/uL (ref 3.80–5.10)
RDW: 11.1 % (ref 11.0–15.0)
Total Lymphocyte: 21.9 %
WBC: 4.1 10*3/uL (ref 3.8–10.8)

## 2023-08-25 LAB — LIPID PANEL
Cholesterol: 141 mg/dL (ref <200)
HDL: 55 mg/dL (ref >=50)
LDL Cholesterol (Calc): 71 mg/dL
Non-HDL Cholesterol (Calc): 86 mg/dL (ref <130)
Total CHOL/HDL Ratio: 2.6 (calc) (ref <5.0)
Triglycerides: 66 mg/dL (ref <150)

## 2023-08-25 LAB — HEMOGLOBIN A1C
Hgb A1c MFr Bld: 5.2 %{Hb} (ref <5.7)
Mean Plasma Glucose: 103 mg/dL
eAG (mmol/L): 5.7 mmol/L

## 2023-08-25 LAB — TSH: TSH: 0.92 m[IU]/L

## 2023-08-25 LAB — FSH/LH
FSH: 28.9 m[IU]/mL
LH: 8.7 m[IU]/mL

## 2023-08-25 LAB — HEPATITIS C ANTIBODY: Hepatitis C Ab: NONREACTIVE

## 2023-08-25 LAB — HIV ANTIBODY (ROUTINE TESTING W REFLEX): HIV 1&2 Ab, 4th Generation: NONREACTIVE

## 2023-08-30 ENCOUNTER — Other Ambulatory Visit: Payer: Self-pay | Admitting: Internal Medicine

## 2023-08-30 DIAGNOSIS — Z13820 Encounter for screening for osteoporosis: Secondary | ICD-10-CM

## 2023-08-30 DIAGNOSIS — R7989 Other specified abnormal findings of blood chemistry: Secondary | ICD-10-CM

## 2023-08-30 DIAGNOSIS — N8311 Corpus luteum cyst of right ovary: Secondary | ICD-10-CM | POA: Diagnosis not present

## 2023-08-30 DIAGNOSIS — Z90710 Acquired absence of both cervix and uterus: Secondary | ICD-10-CM | POA: Diagnosis not present

## 2023-09-07 ENCOUNTER — Ambulatory Visit: Payer: BC Managed Care – PPO | Admitting: Psychiatry

## 2023-09-07 DIAGNOSIS — F4310 Post-traumatic stress disorder, unspecified: Secondary | ICD-10-CM

## 2023-09-07 NOTE — Progress Notes (Unsigned)
 [facility] Counselor/Therapist Progress Note  Patient ID: Peggy Ruiz, MRN: [id number],    Date: 09/07/2023  Time Spent: 59 minutes start time 12:07 PM end time 1:06 PM  Treatment Type: Individual Therapy  Reported Symptoms: anxiety, panic, sleep issues, triggered responses, rumination, irritability,health issues  Mental Status Exam:  Appearance:   Well Groomed     Behavior:  Appropriate  Motor:  Normal  Speech/Language:   Normal Rate  Affect:  Appropriate  Mood:  anxious  Thought process:  normal  Thought content:    WNL  Sensory/Perceptual disturbances:    WNL  Orientation:  oriented to person, place, time/date, and situation  Attention:  Good  Concentration:  Good  Memory:  WNL  Fund of knowledge:   Good  Insight:    Good  Judgment:   Good  Impulse Control:  Good   Risk Assessment: Danger to Self:  No Self-injurious Behavior: No Danger to Others: No Duty to Warn:no Physical Aggression / Violence:No  Access to Firearms a concern: No  Gang Involvement:No   Subjective: Patient was present for session. She shared she was not doing well due to her mother having a heart attack and was in the hospital. She has been released but is still going through testing because they don't know what is going on with her. She went on to share that everything is put on hold for her physically so that is worrying patient.She went on to share she is worried about her dad as well due him not doing as well when things are not good with her mom. She shared this triggered 2015 for her when both of her parents were in the hospital. She has been journaling and trying to take time for her but she is not sure why her mom has to go through all of this. She is going to get the spinal cord stimulator on April 7.  Patient was able to recognize that she has got to have limits and boundaries with her mom even though it is very difficult.  Encouraged her to realize that she has done everything she can  and it is important for her to let her mother make decisions for herself even if she does not agree with them.  Discussed how her parents seem to look to her for decisions that they need to start making for themselves.  Discussed different CBT skill she can use to help her be able to do that.  Also encouraged patient to ask her parents more questions and let them think through how they want to manage things.  Patient agreed that she needs to continue working on letting her parents figure some things out on their own rather than feeling she needs to rescue them.  Interventions: Cognitive Behavioral Therapy, Solution-Oriented/Positive Psychology, and Insight-Oriented  Diagnosis:   ICD-10-CM   1. PTSD (post-traumatic stress disorder)  F43.10       Plan: Patient is to use CBT and coping skills to decrease triggered responses.  Patient is to follow plans from session to work on setting more limits with her mom and dad and making sure she is taking care of herself as well as them.  Patient is to remind herself of all the progress she has made in treatment and to journal her insights.  Patient is to focus on what she can control fix and change she is to start writing them down and reading in them as she needs to.  Patient is to  work on affirming herself regularly that she is safe and thinking through what to do to help her feel safe.  Patient is to start looking into the possibility of a service dog.  Patient is to work on Psychologist, sport and exercise on her mirror to repeat regularly.  Patient is to take things in small steps so that she is able to accomplish tasks.  Patient is to take medication as directed.   Patient is to work on focusing on the things that she can accomplish.  Patient is to work on Dr. Rayfield Citizen leaf on the neuro cycle detox.  Patient is to continue working with providers on medical issues   Stevphen Meuse, [facility]

## 2023-09-28 ENCOUNTER — Ambulatory Visit: Payer: BC Managed Care – PPO | Admitting: Psychiatry

## 2023-09-28 DIAGNOSIS — F411 Generalized anxiety disorder: Secondary | ICD-10-CM | POA: Diagnosis not present

## 2023-09-28 NOTE — Progress Notes (Signed)
 [facility] Counselor/Therapist Progress Note  Patient ID: WAVERLY TARQUINIO, MRN: [id number],    Date: 09/28/2023  Time Spent: 59 minutes start time 12:59 PM end time 1:58 PM  Treatment Type: Individual Therapy  Reported Symptoms: anxiety, panic, health issues, chronic pain, triggered responses  Mental Status Exam:  Appearance:   Well Groomed     Behavior:  Appropriate  Motor:  Normal  Speech/Language:   Normal Rate  Affect:  Appropriate  Mood:  normal  Thought process:  normal  Thought content:    WNL  Sensory/Perceptual disturbances:    WNL  Orientation:  oriented to person, place, time/date, and situation  Attention:  Good  Concentration:  Good  Memory:  WNL  Fund of knowledge:   Good  Insight:    Good  Judgment:   Good  Impulse Control:  Good   Risk Assessment: Danger to Self:  No Self-injurious Behavior: No Danger to Others: No Duty to Warn:no Physical Aggression / Violence:No  Access to Firearms a concern: No  Gang Involvement:No   Subjective: Patient was present for session. She shared she is having her spinal cord stimulator test next week and she reported she is hopeful it is going to go well and she will be okay. She shared her mother and dad are seeing new psych providers and they are enjoying them and that is helping her as well. She shared that a few weeks ago she had a panic attack due to her feeling that it was her job to take care of things with her parents. She shared that her mom made her feel bad about not visiting her enough, her dad was asking her medical questions and her husband was too. She finally realized she has made sure everyone else was okay before she was okay. She has been doing her journaling and recognized it has gone on since she was 5. Encouraged patient to recognize how all of the hard work she has been doing in treatment with her brain spotting and EMDR is finally connecting and coming into some clarity for her.  She was encouraged to  feel really good of about the progress that she has made.  Patient shared she was seeing how things build up and she has been coming to realizations for a while and it does seem that it is all just clicked for her and her PTSD symptoms are much better.She shared she has been doing better physically. She found a sublingual spray that has helped her greatly. She shared it has helped her to sleep and decreased her pain.  Patient acknowledged she feels she needs to focus more just on her general anxiety because her husband has made comments that when she gets overwhelmed and has her emotional outbursts it is very triggering for him.  Developed new treatment plan and goals in session to start treating that concern.  Treatment plan could not be signed and session due to IT issue so it will be set to patient's my chart for review and signature.  Patient agreed to continue working on her journaling continuing to try and focus on her medical issues and to continue trying to find social interactions with friends.  Patient also explained that she and her friend and her husband and friend's husband all went on a double date and that was the first time they had done that in years which was a huge goal for her.  Interventions: Insight-Oriented  Diagnosis:   ICD-10-CM  1. Generalized anxiety disorder  F41.1       Plan:  Patient is to use CBT and coping skills to decrease triggered responses.  Patient is to continue setting more limits with her mom and dad and making sure she is taking care of herself as well as them.  Patient is to remind herself of all the progress she has made in treatment and to journal her insights.  Patient is to focus on what she can control fix and change she is to start writing them down and reading in them as she needs to.  Patient is to work on affirming herself regularly that she is safe and thinking through what to do to help her feel safe.  Patient is to start looking into the possibility  of a service dog.  Patient is to work on Psychologist, sport and exercise on her mirror to repeat regularly.  Patient is to take things in small steps so that she is able to accomplish tasks.  Patient is to take medication as directed.   Patient is to work on focusing on the things that she can accomplish.  Patient is to work on Dr. Rayfield Citizen leaf on the neuro cycle detox.  Patient is to continue working with providers on medical issues   Stevphen Meuse, [facility]

## 2023-10-02 ENCOUNTER — Ambulatory Visit
Admission: RE | Admit: 2023-10-02 | Discharge: 2023-10-02 | Disposition: A | Discharge status: Home / Self Care | Source: Ambulatory Visit | Attending: Student in an Organized Health Care Education/Training Program | Admitting: Student in an Organized Health Care Education/Training Program

## 2023-10-02 ENCOUNTER — Encounter: Payer: Self-pay | Admitting: Student in an Organized Health Care Education/Training Program

## 2023-10-02 ENCOUNTER — Ambulatory Visit
Payer: BC Managed Care – PPO | Attending: Student in an Organized Health Care Education/Training Program | Admitting: Student in an Organized Health Care Education/Training Program

## 2023-10-02 DIAGNOSIS — M961 Postlaminectomy syndrome, not elsewhere classified: Secondary | ICD-10-CM | POA: Diagnosis not present

## 2023-10-02 DIAGNOSIS — M5416 Radiculopathy, lumbar region: Secondary | ICD-10-CM | POA: Diagnosis not present

## 2023-10-02 DIAGNOSIS — G8929 Other chronic pain: Secondary | ICD-10-CM | POA: Diagnosis not present

## 2023-10-02 MED ORDER — CLINDAMYCIN PHOSPHATE 600 MG/50ML IV SOLN
600.0000 mg | Freq: Once | INTRAVENOUS | Status: AC
  Administered 2023-10-02: 600 mg via INTRAVENOUS
  Filled 2023-10-02: qty 50

## 2023-10-02 MED ORDER — CEPHALEXIN 500 MG PO CAPS: 500.0000 mg | ORAL_CAPSULE | Freq: Four times a day (QID) | ORAL | 0 refills | Status: DC

## 2023-10-02 MED ORDER — MIDAZOLAM HCL 5 MG/5ML IJ SOLN
INTRAMUSCULAR | Status: AC
  Filled 2023-10-02: qty 5

## 2023-10-02 MED ORDER — FENTANYL CITRATE (PF) 100 MCG/2ML IJ SOLN
INTRAMUSCULAR | Status: AC
Start: 2023-10-02 — End: ?
  Filled 2023-10-02: qty 2

## 2023-10-02 MED ORDER — ROPIVACAINE HCL 2 MG/ML IJ SOLN
18.0000 mL | Freq: Once | INTRAMUSCULAR | Status: AC
  Administered 2023-10-02: 18 mL via PERINEURAL

## 2023-10-02 MED ORDER — FENTANYL CITRATE (PF) 100 MCG/2ML IJ SOLN
25.0000 ug | INTRAMUSCULAR | Status: DC | PRN
  Administered 2023-10-02: 50 ug via INTRAVENOUS

## 2023-10-02 MED ORDER — ROPIVACAINE HCL 2 MG/ML IJ SOLN
INTRAMUSCULAR | Status: AC
  Filled 2023-10-02: qty 20

## 2023-10-02 MED ORDER — MIDAZOLAM HCL 5 MG/5ML IJ SOLN
0.5000 mg | Freq: Once | INTRAMUSCULAR | Status: AC
  Administered 2023-10-02: 2 mg via INTRAVENOUS

## 2023-10-02 MED ORDER — LACTATED RINGERS IV SOLN: Freq: Once | INTRAVENOUS | Status: AC

## 2023-10-02 MED ORDER — LIDOCAINE HCL 2 % IJ SOLN
INTRAMUSCULAR | Status: AC
  Filled 2023-10-02: qty 20

## 2023-10-02 MED ORDER — LIDOCAINE HCL 2 % IJ SOLN
20.0000 mL | Freq: Once | INTRAMUSCULAR | Status: AC
  Administered 2023-10-02: 400 mg

## 2023-10-02 MED ORDER — CLINDAMYCIN HCL 300 MG PO CAPS: 300.0000 mg | ORAL_CAPSULE | Freq: Three times a day (TID) | ORAL | 0 refills | Status: AC

## 2023-10-02 MED ORDER — IOPAMIDOL (ISOVUE-M 200) INJECTION 41%: 10.0000 mL | Freq: Once | INTRAMUSCULAR | Status: DC

## 2023-10-02 NOTE — Progress Notes (Signed)
 Safety precautions to be maintained throughout the outpatient stay will include: orient to surroundings, keep bed in low position, maintain call bell within reach at all times, provide assistance with transfer out of bed and ambulation.

## 2023-10-02 NOTE — Patient Instructions (Signed)
 Today we did the following -We have done a Spinal Cord Stimulator Trial with Medtronic  -As long as the leads are in place, do not bathe or shower. You may sponge bathe.  -While the lead is in place, please limit the bending, lifting, or twisting because the lead can move.  -The things we want to see is if your pain improves (and by what percentage), if you can do more activity (don't overdo it), and if you can use less of your "as needed" medicine. Do not stop long acting medicines like methadone, oxycontin, MS Contin, etc without checking with [place].  -It is VERY important that you pick up the antibiotics we prescribed, Keflex, on your way home from the trial and take them as prescribed(4 times a day), starting today, for as long as the lead is in place.  -The Spina Cord Stimulator Representative will be in contact with you while the lead is in place to make sure the trial goes as well as possible.  -Please contact us with any questions or concerns at any time during the trial.   -If you start running a fever over 100 degrees, have severe back pain, or new pain running down the legs, or drainage coming from the lead site, contact us immediately and/or go to the emergency room.  -Please do not restart any sort of medication that can thin your blood such as Aspirin, ibuprofen, motrin, aleve, plavix, coumadin, etc. If you aren't sure, call and ask.  -We will have you return next Mondday to have the lead removed. If this is successful, at that point we can go over the details about the permanent implant.

## 2023-10-02 NOTE — Progress Notes (Signed)
 PROVIDER NOTE: Interpretation of information contained herein should be left to medically-trained personnel. Specific patient instructions are provided elsewhere under "Patient Instructions" section of medical record. This document was created in part using STT-dictation technology, any transcriptional errors that may result from this process are unintentional.  Patient: Peggy Ruiz Type: Established DOB: [birth year] MRN: [id number] PCP: Margarita Mail, DO  Service: Procedure DOS: 10/02/2023 Setting: Ambulatory Location: Ambulatory outpatient facility Delivery: Face-to-face Provider: Edward Jolly, MD Specialty: Interventional Pain Management Specialty designation: 09 Location: Outpatient facility Ref. Prov.: Edward Jolly, MD       Interventional Therapy   Primary Reason for Admission: Surgical management of chronic pain condition.   Procedure:              Type: Medtronic Trial Spinal Cord Neurostimulator Implant (Percutaneous, interlaminar, posterior epidural placement) Laterality: Bilateral (-50)  Level: Lumbar  Imaging: Fluoroscopic guidance Anesthesia: Local anesthesia (1-2% Lidocaine)         Sedation: Moderate Sedation                       DOS: 10/02/2023  Performed by: Edward Jolly, MD  Purpose: Diagnostic. To determine if a permanent implant may be effective in controlling some or all of Peggy Ruiz's chronic pain symptoms.  Rationale (medical necessity): procedure needed and proper for the diagnosis and/or treatment of Peggy Ruiz's medical symptoms and needs. 1. Failed back surgical syndrome   2. Lumbar post-laminectomy syndrome   3. Chronic radicular lumbar pain (right leg)    NAS-11 Pain score:   Pre-procedure: 7 /10   Post-procedure: 0-No pain/10     Target: Posterior epidural space over the dorsal columns of the spinal cord. Location: Posterior intraspinal canal Region: Thoracolumbar  Approach: Translaminar percutaneous  Type of procedure: Surgical    Position / Prep / Materials:  Position: Prone  Prep solution: ChloraPrep (2% chlorhexidine gluconate and 70% isopropyl alcohol) Prep Area: Entire  Posterior  Thoracolumbar  Region  Materials:  Tray: Implant tray Needle(s):  Type: Epidural  Gauge (G):  14   Length: Regular (10cm)  Qty: 2  H&P (Pre-op Assessment):  Peggy Ruiz is a 38 y.o. (year old), female patient, seen today for interventional treatment. She  has a past surgical history that includes Hernia repair; Eye surgery; Back surgery; Abdominal hysterectomy; Gallbladder surgery; and cervical cone ciopsy.  Initial Vital Signs:  Pulse/EKG Rate: 94ECG Heart Rate: 84 (nsr) Temp: 98.4 F (36.9 C) Resp: 18 BP: 100/67 SpO2: 100 %  BMI: Estimated body mass index is 19.77 kg/m as calculated from the following:   Height as of this encounter: 5\' 8"  (1.727 m).   Weight as of this encounter: 130 lb (59 kg).  Risk Assessment: Allergies: Reviewed. She is allergic to benzyl alcohol, benzoin, chlorhexidine, promethazine, scopolamine, ciprofloxacin, dilaudid [hydromorphone], epinephrine, penicillins, toradol [ketorolac tromethamine], and bactrim [sulfamethoxazole-trimethoprim].  Allergy Precautions: None required Coagulopathies: Reviewed. None identified.  Blood-thinner therapy: None at this time Active Infection(s): Reviewed. None identified. Peggy Ruiz is afebrile  Site Confirmation: Peggy Ruiz was asked to confirm the procedure and laterality before marking the site, which she did. Procedure checklist: Completed Consent: Before the procedure and under the influence of no sedative(s), amnesic(s), or anxiolytics, the patient was informed of the treatment options, risks and possible complications. To fulfill our ethical and legal obligations, as recommended by the American Medical Association's Code of Ethics, I have informed the patient of my clinical impression; the nature and purpose of the treatment or procedure; the  risks,  benefits, and possible complications of the intervention; the alternatives, including doing nothing; the risk(s) and benefit(s) of the alternative treatment(s) or procedure(s); and the risk(s) and benefit(s) of doing nothing.  Peggy Ruiz was provided with information about the general risks and possible complications associated with most interventional procedures. These include, but are not limited to: failure to achieve desired goals, infection, bleeding, organ or nerve damage, allergic reactions, paralysis, and/or death.  In addition, she was informed of those risks and possible complications associated to this particular procedure, which include, but are not limited to: damage to the implant; failure to decrease pain; local, systemic, or serious CNS infections, intraspinal abscess with possible cord compression and paralysis, or life-threatening such as meningitis; intrathecal and/or epidural bleeding with formation of hematoma with possible spinal cord compression and permanent paralysis; organ damage; nerve injury or damage with subsequent sensory, motor, and/or autonomic system dysfunction, resulting in transient or permanent pain, numbness, and/or weakness of one or several areas of the body; allergic reactions, either minor or major life-threatening, such as anaphylactic or anaphylactoid reactions.  Furthermore, Peggy Ruiz was informed of those risks and complications associated with the medications. These include, but are not limited to: allergic reactions (i.e.: anaphylactic or anaphylactoid reactions); arrhythmia;  Hypotension/hypertension; cardiovascular collapse; respiratory depression and/or shortness of breath; swelling or edema; medication-induced neural toxicity; particulate matter embolism and blood vessel occlusion with resultant organ, and/or nervous system infarction and permanent paralysis.  Finally, she was informed that Medicine is not an exact science; therefore, there is also the  possibility of unforeseen or unpredictable risks and/or possible complications that may result in a catastrophic outcome. The patient indicated having understood very clearly. We have given the patient no guarantees and we have made no promises. Enough time was given to the patient to ask questions, all of which were answered to the patient's satisfaction. Ms. Buist has indicated that she wanted to continue with the procedure. Attestation: I, the ordering provider, attest that I have discussed with the patient the benefits, risks, side-effects, alternatives, likelihood of achieving goals, and potential problems during recovery for the procedure that I have provided informed consent. Date  Time: 10/02/2023  8:17 AM  Pre-Procedure Preparation:  Monitoring: As per clinic protocol. Respiration, ETCO2, SpO2, BP, heart rate and rhythm monitor placed and checked for adequate function Safety Precautions: Patient was assessed for positional comfort and pressure points before starting the procedure. Time-out: I initiated and conducted the "Time-out" before starting the procedure, as per protocol. The patient was asked to participate by confirming the accuracy of the "Time Out" information. Verification of the correct person, site, and procedure were performed and confirmed by me, the nursing staff, and the patient. "Time-out" conducted as per Joint Commission's Universal Protocol (UP.01.01.01). Time: 0942 Start Time: 0942 hrs.  Description/Narrative of Procedure:          Rationale (medical necessity): procedure needed and proper for the diagnosis and/or treatment of the patient's medical symptoms and needs. Procedural Technique Safety Precautions: Aspiration looking for blood return was conducted prior to all injections. At no point did we inject any substances, as a needle was being advanced. No attempts were made at seeking any paresthesias. Safe injection practices and needle disposal techniques used.  Medications properly checked for expiration dates. SDV (single dose vial) medications used. Description of the Procedure: Protocol guidelines were followed. The patient was assisted into a comfortable position. The target area was identified and the area prepped in the usual manner. Skin &  deeper tissues infiltrated with local anesthetic. Appropriate amount of time allowed to pass for local anesthetics to take effect. The procedure needles were then advanced to the target area. Proper needle placement secured. Negative aspiration confirmed. Solution injected in intermittent fashion, asking for systemic symptoms every 0.5cc of injectate. The needles were then removed and the area cleansed, making sure to leave some of the prepping solution back to take advantage of its long term bactericidal properties.  Technical description of procedure: Availability of a responsible, adult driver, and NPO status confirmed. Informed consent was obtained after having discussed risks and possible complications. An IV was started. The patient was then taken to the fluoroscopy suite, where the patient was placed in position for the procedure, over the fluoroscopy table. The patient was then monitored in the usual manner. Fluoroscopy was manipulated to obtain the best possible view of the target. Parallex error was corrected before commencing the procedure. Once a clear view of the target had been obtained, the skin and deeper tissues over the procedure site were infiltrated using lidocaine, loaded in a 10 cc luer-loc syringe with a 0.5 inch, 25-G needle. The introducer needle(s) was/were then inserted through the skin and deeper tissues. A paramidline approach was used to enter the posterior epidural space at a 30 angle, using "Loss-of-resistance Technique" with 3 ml of PF-NaCl (0.9% NSS). Correct needle placement was confirmed in the antero-posterior and lateral fluoroscopic views. The lead was gently introduced and manipulated  under real-time fluoroscopy, constantly assessing for pain, discomfort, or paresthesias, until the tip rested at the desired level. Both sides were done in identical fashion. Electrode placement was tested until appropriate coverage was attained. Once the patient confirmed that the stimulation was over the desired area, the lead(s) was/were secured in place and the introducer needles removed. This was done under real-time fluoroscopy while observing the electrode tip to avoid unintended migration. The area was covered with a non-occlusive dressing and the patient transported to recovery for further programming.  Vitals:   10/02/23 1008 10/02/23 1018 10/02/23 1028 10/02/23 1037  BP: (!) 127/92 106/79 103/74 98/72  Pulse:      Resp: 17 15 15 13   Temp:      SpO2: 100% 98% 100% 100%  Weight:      Height:        Start Time: 0942 hrs. End Time: 1007 hrs.  Neurostimulator Details:  Lead(s):  Brand: Medtronic         Epidural Access Level:  T12-L1 T12-L1  Lead implant:  Bilateral   No. of Electrodes/Lead:  8 8  Laterality:  Left Right  Top electrode location:  T8 Mid T8  Model No.: [id number] Same  Length: 60 cm Same  Lot No.: [id number]  MRI compatibility:  Conditional Same       Imaging Guidance (Spinal):          Type of Imaging Technique: Fluoroscopy Guidance (Spinal) Indication(s): Fluoroscopy guidance for needle placement to enhance accuracy in procedures requiring precise needle localization for targeted delivery of medication in or near specific anatomical locations not easily accessible without such real-time imaging assistance. Exposure Time: Please see nurses notes. Contrast: None used. Fluoroscopic Guidance: I was personally present during the use of fluoroscopy. "Tunnel Vision Technique" used to obtain the best possible view of the target area. Parallax error corrected before commencing the procedure. "Direction-depth-direction" technique used to introduce the  needle under continuous pulsed fluoroscopy. Once target was reached, antero-posterior, oblique, and lateral fluoroscopic projection used confirm  needle placement in all planes. Images permanently stored in EMR. Interpretation: No contrast injected. I personally interpreted the imaging intraoperatively. Adequate needle placement confirmed in multiple planes. Permanent images saved into the patient's record.  Antibiotic Prophylaxis:   Anti-infectives (From admission, onward)    Start     Dose/Rate Route Frequency Ordered Stop   10/02/23 0845  clindamycin (CLEOCIN) IVPB 600 mg       Note to Pharmacy: For scs trial PPX   600 mg 100 mL/hr over 30 Minutes Intravenous  Once 10/02/23 0835 10/02/23 0915   10/02/23 0000  cephALEXin (KEFLEX) 500 MG capsule  Status:  Discontinued        500 mg Oral 4 times daily 10/02/23 0828 10/02/23    10/02/23 0000  clindamycin (CLEOCIN) 300 MG capsule        300 mg Oral 3 times daily 10/02/23 0933 10/09/23 2359      Indication(s): Implant Prophylaxis.  Post-operative Assessment:  Post-procedure Vital Signs:  Pulse/HCG Rate: 9479 Temp: 98.4 F (36.9 C) Resp: 13 BP: 98/72 SpO2: 100 %  Complications: No immediate post-treatment complications observed by team, or reported by patient.  Note: The patient tolerated the entire procedure well. A repeat set of vitals were taken after the procedure and the patient was kept under observation following institutional policy, for this type of procedure. Post-procedural neurological assessment was performed, showing return to baseline, prior to discharge. The patient was provided with post-procedure discharge instructions, including a section on how to identify potential problems. Should any problems arise concerning this procedure, the patient was given instructions to immediately contact us, at any time, without hesitation. In any case, we plan to contact the patient by telephone for a follow-up status report regarding  this interventional procedure.  Comments:  No additional relevant information.  Plan of Care  Orders:  Orders Placed This Encounter  Procedures   DG PAIN CLINIC C-ARM 1-60 MIN NO REPORT    Intraoperative interpretation by procedural physician at [facility].    Standing Status:   Standing    Number of Occurrences:   1    Reason for exam::   Assistance in needle guidance and placement for procedures requiring needle placement in or near specific anatomical locations not easily accessible without such assistance.     Medications administered: We administered lidocaine, midazolam, fentaNYL, lactated ringers, ropivacaine (PF) 2 mg/mL (0.2%), and clindamycin.  See the medical record for exact dosing, route, and time of administration.  Follow-up plan:   Return in about 1 week (around 10/09/2023) for SCS lead pull.       Recent Visits Date Type Provider Dept  08/10/23 Office Visit Edward Jolly, MD [facility]  Showing recent visits within past 90 days and meeting all other requirements Today's Visits Date Type Provider Dept  10/02/23 Procedure visit Edward Jolly, MD [facility]  Showing today's visits and meeting all other requirements Future Appointments Date Type Provider Dept  10/09/23 Appointment Edward Jolly, MD [facility]  Showing future appointments within next 90 days and meeting all other requirements  Disposition: Discharge home  Discharge (Date  Time): 10/02/2023; 1045 hrs.   Primary Care Physician: Margarita Mail, DO Location: [facility] Note by: Edward Jolly, MD (TTS technology used. I apologize for any typographical errors that were not detected and corrected.) Date: 10/02/2023; Time: 11:02 AM

## 2023-10-03 ENCOUNTER — Telehealth: Payer: Self-pay | Admitting: *Deleted

## 2023-10-03 NOTE — Telephone Encounter (Signed)
 No problems post procedure.

## 2023-10-04 ENCOUNTER — Telehealth: Payer: Self-pay | Admitting: Student in an Organized Health Care Education/Training Program

## 2023-10-04 NOTE — Telephone Encounter (Signed)
 PT stated that she is in a lot of pain, pain in stomach, chest. and back. PT stated that she can't sleep and that she doesn't know if a lead has moved. Please give patient a call. TY

## 2023-10-04 NOTE — Telephone Encounter (Signed)
 Disregard that msg, spoke back with patient. PT inform me that Peggy Ruiz return her call. PT was suppose to turn pump down when she lay down and was not inform about that. So the pump has been adjusted. Peggy Ruiz example everything to the patient and apologize to the patient. FYI

## 2023-10-09 ENCOUNTER — Ambulatory Visit (HOSPITAL_BASED_OUTPATIENT_CLINIC_OR_DEPARTMENT_OTHER): Admitting: Student in an Organized Health Care Education/Training Program

## 2023-10-09 ENCOUNTER — Other Ambulatory Visit: Payer: Self-pay | Admitting: Student in an Organized Health Care Education/Training Program

## 2023-10-09 ENCOUNTER — Encounter: Payer: Self-pay | Admitting: Internal Medicine

## 2023-10-09 ENCOUNTER — Encounter: Payer: Self-pay | Admitting: Student in an Organized Health Care Education/Training Program

## 2023-10-09 ENCOUNTER — Ambulatory Visit
Admission: RE | Admit: 2023-10-09 | Discharge: 2023-10-09 | Disposition: A | Discharge status: Home / Self Care | Source: Ambulatory Visit | Attending: Student in an Organized Health Care Education/Training Program | Admitting: Student in an Organized Health Care Education/Training Program

## 2023-10-09 VITALS — BP 108/84 | HR 94 | Temp 98.4°F | Ht 68.0 in | Wt 130.0 lb

## 2023-10-09 DIAGNOSIS — R52 Pain, unspecified: Secondary | ICD-10-CM

## 2023-10-09 DIAGNOSIS — G8929 Other chronic pain: Secondary | ICD-10-CM

## 2023-10-09 DIAGNOSIS — M961 Postlaminectomy syndrome, not elsewhere classified: Secondary | ICD-10-CM

## 2023-10-09 NOTE — Progress Notes (Signed)
 Safety precautions to be maintained throughout the outpatient stay will include: orient to surroundings, keep bed in low position, maintain call bell within reach at all times, provide assistance with transfer out of bed and ambulation.

## 2023-10-09 NOTE — Progress Notes (Signed)
 1610 Lead removal per Dr. Rhesa Celeste. Site clear. Leads intact. Wound care instructions given.

## 2023-10-09 NOTE — Progress Notes (Signed)
 PROVIDER NOTE: Interpretation of information contained herein should be left to medically-trained personnel. Specific patient instructions are provided elsewhere under "Patient Instructions" section of medical record. This document was created in part using AI and STT-dictation technology, any transcriptional errors that may result from this process are unintentional.  Patient: Peggy Ruiz  Service: E/M   PCP: Rockney Cid, DO  DOB: 27-Mar-1986  DOS: 10/09/2023  Provider: Cephus Collin, MD  MRN: [id number]  Delivery: Face-to-face  Specialty: Interventional Pain Management  Type: Established Patient  Setting: Ambulatory outpatient facility  Specialty designation: 09  Referring Prov.: Rockney Cid, DO  Location: Outpatient office facility       HPI  [name], a 38 y.o. year old female, is here today because of her Failed back surgical syndrome [M96.1]. Ms. Mckercher primary complain today is Back Pain (lower)   Pain Assessment: Severity of Chronic pain is reported as a 2 /10. Location: Back Lower/radiates down right leg. Onset: More than a month ago. Quality: Stabbing, Radiating. Timing: Constant. Modifying factor(s): rest, ice,. Vitals:  height is 5\' 8"  (1.727 m) and weight is 130 lb (59 kg). Her temperature is 98.4 F (36.9 C). Her blood pressure is 108/84 and her pulse is 94. Her oxygen saturation is 100%.  BMI: Estimated body mass index is 19.77 kg/m as calculated from the following:   Height as of this encounter: 5\' 8"  (1.727 m).   Weight as of this encounter: 130 lb (59 kg). Last encounter: 08/10/2023. Last procedure: 10/02/2023.  Reason for encounter:    Post-procedure evaluation    Type: Medtronic Trial Spinal Cord Neurostimulator Implant (Percutaneous, interlaminar, posterior epidural placement) Laterality: Bilateral (-50)  Level: Lumbar  Imaging: Fluoroscopic guidance Anesthesia: Local anesthesia (1-2% Lidocaine)         Sedation: Moderate Sedation                        DOS: 10/02/2023  Performed by: Cephus Collin, MD  Purpose: Diagnostic. To determine if a permanent implant may be effective in controlling some or all of Ms. Loretto's chronic pain symptoms.  Rationale (medical necessity): procedure needed and proper for the diagnosis and/or treatment of Ms. Stotts's medical symptoms and needs. 1. Failed back surgical syndrome   2. Lumbar post-laminectomy syndrome   3. Chronic radicular lumbar pain (right leg)    NAS-11 Pain score:   Pre-procedure: 7 /10   Post-procedure: 0-No pain/10    Effectiveness:  Initial hour after procedure: 75 %  Subsequent 4-6 hours post-procedure: 75 %  Analgesia past initial 6 hours: 85 %    Patient is status post successful spinal cord stimulator trial with Medtronic and endorses approximately 85% pain relief and improvement in her functional status.  She would like to move forward with permanent implant.    ROS  Constitutional: Denies any fever or chills Gastrointestinal: No reported hemesis, hematochezia, vomiting, or acute GI distress Musculoskeletal: Denies any acute onset joint swelling, redness, loss of ROM, or weakness Neurological: No reported episodes of acute onset apraxia, aphasia, dysarthria, agnosia, amnesia, paralysis, loss of coordination, or loss of consciousness  Medication Review  Magnesium Oxide (Laxative), Tenapanor HCl, brexpiprazole, buPROPion, clindamycin, clonazePAM, cycloSPORINE, gabapentin, itraconazole, lansoprazole, levothyroxine, loratadine, methocarbamol, multivitamin, nortriptyline, ondansetron, tapentadol HCl, and ursodiol  History Review  Allergy: Ms. Ramseyer is allergic to benzyl alcohol, benzoin, chlorhexidine, promethazine, scopolamine, ciprofloxacin, dilaudid [hydromorphone], epinephrine, penicillins, toradol [ketorolac tromethamine], and bactrim [sulfamethoxazole-trimethoprim]. Drug: Ms. Tegeler  reports no history of drug use.  Alcohol:  reports current alcohol  use. Tobacco:  reports that she has never smoked. She has never used smokeless tobacco. Social: Ms. Farve  reports that she has never smoked. She has never used smokeless tobacco. She reports current alcohol use. She reports that she does not use drugs. Medical:  has a past medical history of Anorexia, Anxiety, Attention deficit disorder (ADD), Bone spur, Depression, Eczema, HPV (human papilloma virus) infection, and Thyroid disease. Surgical: Ms. Skow  has a past surgical history that includes Hernia repair; Eye surgery; Back surgery; Abdominal hysterectomy; Gallbladder surgery; and cervical cone ciopsy. Family: family history includes Anxiety disorder in her father and mother; Depression in her father and mother; Fibromyalgia in her mother.  Laboratory Chemistry Profile   Renal Lab Results  Component Value Date   BUN 15 08/24/2023   CREATININE 0.85 08/24/2023   BCR SEE NOTE: 08/24/2023   GFRAA >60 06/26/2017   GFRNONAA >60 05/28/2023    Hepatic Lab Results  Component Value Date   AST 22 08/24/2023   ALT 16 08/24/2023   ALBUMIN 4.5 05/28/2023   ALKPHOS 50 05/28/2023    Electrolytes Lab Results  Component Value Date   NA 138 08/24/2023   K 4.3 08/24/2023   CL 102 08/24/2023   CALCIUM 9.4 08/24/2023    Bone No results found for: "VD25OH", "VD125OH2TOT", "WU9811BJ4", "NW2956OZ3", "25OHVITD1", "25OHVITD2", "25OHVITD3", "TESTOFREE", "TESTOSTERONE"  Inflammation (CRP: Acute Phase) (ESR: Chronic Phase) No results found for: "CRP", "ESRSEDRATE", "LATICACIDVEN"       Note: Above Lab results reviewed.  Recent Imaging Review  DG PAIN CLINIC C-ARM 1-60 MIN NO REPORT Fluoro was used, but no Radiologist interpretation will be provided.  Please refer to "NOTES" tab for provider progress note. Note: Reviewed        Physical Exam  General appearance: Well nourished, well developed, and well hydrated. In no apparent acute distress Mental status: Alert, oriented x 3 (person, place,  & time)       Respiratory: No evidence of acute respiratory distress Eyes: PERLA Vitals: BP 108/84   Pulse 94   Temp 98.4 F (36.9 C)   Ht 5\' 8"  (1.727 m)   Wt 130 lb (59 kg)   LMP 03/02/2017 (Exact Date)   SpO2 100%   BMI 19.77 kg/m  BMI: Estimated body mass index is 19.77 kg/m as calculated from the following:   Height as of this encounter: 5\' 8"  (1.727 m).   Weight as of this encounter: 130 lb (59 kg). Ideal: Ideal body weight: 63.9 kg (140 lb 14 oz)  Trial leads removed under live fluoroscopy with tips intact  Assessment   Diagnosis Status  1. Failed back surgical syndrome   2. Lumbar post-laminectomy syndrome   3. Chronic radicular lumbar pain (right leg)    Controlled Controlled Controlled   Updated Problems: No problems updated.  Plan of Care  Referral to Dr. Marcell Barlow for Medtronic SCS implant Orders:  Orders Placed This Encounter  Procedures   Ambulatory referral to Neurosurgery    Referral Priority:   Routine    Referral Type:   Surgical    Referral Reason:   Specialty Services Required    Referred to Provider:   Venetia Night, MD    Requested Specialty:   Neurosurgery    Number of Visits Requested:   1   Follow-up plan:   No follow-ups on file.    Recent Visits Date Type Provider Dept  10/02/23 Procedure visit Edward Jolly, MD [facility]  08/10/23 Office Visit Cephus Collin, MD [facility]  Showing recent visits within past 90 days and meeting all other requirements Today's Visits Date Type Provider Dept  10/09/23 Procedure visit Cephus Collin, MD [facility]  Showing today's visits and meeting all other requirements Future Appointments No visits were found meeting these conditions. Showing future appointments within next 90 days and meeting all other requirements  I discussed the assessment and treatment plan with the patient. The patient was provided an opportunity to ask questions and all were answered.  The patient agreed with the plan and demonstrated an understanding of the instructions.  Patient advised to call back or seek an in-person evaluation if the symptoms or condition worsens.  Duration of encounter: .  Total time on encounter, as per AMA guidelines included both the face-to-face and non-face-to-face time personally spent by the physician and/or other qualified health care professional(s) on the day of the encounter (includes time in activities that require the physician or other qualified health care professional and does not include time in activities normally performed by clinical staff). Physician's time may include the following activities when performed: Preparing to see the patient (e.g., pre-charting review of records, searching for previously ordered imaging, lab work, and nerve conduction tests) Review of prior analgesic pharmacotherapies. Reviewing PMP Interpreting ordered tests (e.g., lab work, imaging, nerve conduction tests) Performing post-procedure evaluations, including interpretation of diagnostic procedures Obtaining and/or reviewing separately obtained history Performing a medically appropriate examination and/or evaluation Counseling and educating the patient/family/caregiver Ordering medications, tests, or procedures Referring and communicating with other health care professionals (when not separately reported) Documenting clinical information in the electronic or other health record Independently interpreting results (not separately reported) and communicating results to the patient/ family/caregiver Care coordination (not separately reported)  Note by: Cephus Collin, MD (TTS and AI technology used. I apologize for any typographical errors that were not detected and corrected.) Date: 10/09/2023; Time: 8:58 AM

## 2023-10-12 ENCOUNTER — Ambulatory Visit
Admission: RE | Admit: 2023-10-12 | Discharge: 2023-10-12 | Disposition: A | Discharge status: Home / Self Care | Source: Ambulatory Visit | Attending: Internal Medicine | Admitting: Internal Medicine

## 2023-10-12 ENCOUNTER — Encounter: Payer: Self-pay | Admitting: Urgent Care

## 2023-10-12 ENCOUNTER — Ambulatory Visit (INDEPENDENT_AMBULATORY_CARE_PROVIDER_SITE_OTHER): Admitting: Neurosurgery

## 2023-10-12 ENCOUNTER — Other Ambulatory Visit: Payer: Self-pay

## 2023-10-12 VITALS — BP 112/78 | Ht 68.0 in | Wt 132.6 lb

## 2023-10-12 DIAGNOSIS — M961 Postlaminectomy syndrome, not elsewhere classified: Secondary | ICD-10-CM

## 2023-10-12 DIAGNOSIS — G894 Chronic pain syndrome: Secondary | ICD-10-CM

## 2023-10-12 DIAGNOSIS — M5126 Other intervertebral disc displacement, lumbar region: Secondary | ICD-10-CM | POA: Diagnosis not present

## 2023-10-12 DIAGNOSIS — Z780 Asymptomatic menopausal state: Secondary | ICD-10-CM | POA: Diagnosis not present

## 2023-10-12 DIAGNOSIS — M532X6 Spinal instabilities, lumbar region: Secondary | ICD-10-CM | POA: Diagnosis not present

## 2023-10-12 DIAGNOSIS — Z13820 Encounter for screening for osteoporosis: Secondary | ICD-10-CM | POA: Diagnosis not present

## 2023-10-12 DIAGNOSIS — R7989 Other specified abnormal findings of blood chemistry: Secondary | ICD-10-CM | POA: Diagnosis not present

## 2023-10-12 NOTE — Progress Notes (Signed)
 Referring Physician:  Margarita Mail, DO [address] [place],  [place] [address]  Primary Physician:  Margarita Mail, DO  History of Present Illness: 10/12/2023 [name] presents today to discuss placement of spinal cord stimulator.  She had an excellent trial with 85% improvement in symptoms.  07/11/2023 Peggy Ruiz returns to see me.  She continues to have significant back pain.  She has thought the options over, would like to consider spinal cord stimulator evaluation.   05/09/2023 [name] is here today with a chief complaint of back and right leg pain.  This is well discussed below.  She has tried and failed conservative management.   The symptoms are causing a significant impact on the patient's life.   I have utilized the care everywhere function in epic to review the outside records available from external health systems.  History of Present Illness: Notes from Drake Leach PA-C [name] is a 38 y.o. female has a history of cervical CA, PTSD, GERD, lyme disease, migraines, pelvic pain, hypothyroidism, FM, and chronic back pain.    She had hysterectomy for cervical CA on Friday 04/14/23.    History of lumbar fusion L5-S1 on 11/24/2016 Dr. Estill Bakes. This helped her right leg pain but she continued with chronic back pain.    Last seen by me on 03/28/23 for constant LBP and intermittent right leg pain. 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 She has some weakness in right foot with DF/PF/EHL.    Phone visit to review her lumbar MRI results.    She is doing well after her hysterectomy on Friday.    She continues with constant LBP with intermittent right anterior/lateral leg pain to her foot that is getting worse. Her right foot is numb when she has pain in the right leg- this is new. She has weakness in right foot as well. She still has numbness in right leg with walking.  Pain is sharp and feels electrical. No left leg pain. Pain is worse with prolonged sitting/standing/walking. She is using a cane.    She was prescribed SUBUTEX from Dr. Ethelene Hal- has not started it yet. Was on nycenta and insurance stopped covering it.    Bowel/Bladder Dysfunction: history of chronic urinary urgency (has seen urology), no bowel incontinence.    Conservative measures:  Physical therapy: did in last year at [facility] in [place] Multimodal medical therapy including regular antiinflammatories: flexeril, neurontin, subutex, nycenta Injections:  Had ESIs at [facility] in [place]- last was in August right L4 TF ESI on 12/23/21   Past Surgery:  revision L5-S1, L4-L5 discectomy and TLIF L5-S1 on 11/24/16.  She's had multiple other lumbar surgeries (2 discectomies?)  Review of Systems:  A 10 point review of systems is negative, except for the pertinent positives and negatives detailed in the HPI.  Past Medical History: Past Medical History:  Diagnosis Date   Anorexia    at 67   Anxiety    Attention deficit disorder (ADD)    Bone spur    Depression    Eczema    HPV (human papilloma virus) infection    Thyroid disease     Past Surgical History: Past Surgical History:  Procedure Laterality Date   ABDOMINAL HYSTERECTOMY     BACK SURGERY     3 different surgeries   cervical cone ciopsy     EYE SURGERY     for lazy eye  GALLBLADDER SURGERY     HERNIA REPAIR      Allergies: Allergies as of 10/12/2023 - Review Complete 10/12/2023  Allergen Reaction Noted   Benzyl alcohol Hives and Other (See Comments) 06/25/2015   Benzoin Hives and Other (See Comments) 08/11/2022   Chlorhexidine Rash 07/24/2023   Promethazine  02/11/2016   Scopolamine Rash 08/11/2022   Ciprofloxacin Other (See Comments) 02/07/2012   Dilaudid [hydromorphone]  04/07/2017   Epinephrine  02/25/2019   Penicillins Hives 02/07/2012   Toradol [ketorolac tromethamine] Nausea And Vomiting  04/07/2017   Bactrim [sulfamethoxazole-trimethoprim] Rash 05/09/2023    Medications:  Current Outpatient Medications:    buPROPion (WELLBUTRIN XL) 150 MG 24 hr tablet, Take 1 tablet (150 mg total) by mouth daily., Disp: 90 tablet, Rfl: 1   clonazePAM (KLONOPIN) 0.5 MG tablet, Take 1 tablet (0.5 mg total) by mouth 2 (two) times daily AND 2 tablets (1 mg total) at bedtime., Disp: 360 tablet, Rfl: 2   gabapentin (NEURONTIN) 800 MG tablet, Take 800 mg by mouth 2 (two) times daily., Disp: , Rfl:    itraconazole (SPORANOX) 100 MG capsule, Take 2 capsules (200 mg total) by mouth daily., Disp: 168 capsule, Rfl: 0   lansoprazole (PREVACID) 30 MG capsule, Take 30 mg by mouth 2 (two) times daily before a meal., Disp: , Rfl:    levothyroxine (SYNTHROID) 50 MCG tablet, Take 1 tablet (50 mcg total) by mouth daily., Disp: 90 tablet, Rfl: 0   loratadine (CLARITIN) 10 MG tablet, Take by mouth daily as needed., Disp: , Rfl:    Magnesium Oxide, Laxative, 500 MG TABS, , Disp: , Rfl:    methocarbamol (ROBAXIN) 750 MG tablet, Take 1 tablet 3 times a day by oral route as needed for 30 days., Disp: , Rfl:    Multiple Vitamin (MULTIVITAMIN) tablet, Take 1 tablet by mouth daily., Disp: , Rfl:    nortriptyline (PAMELOR) 10 MG capsule, Take 1 capsule (10 mg total) by mouth at bedtime., Disp: 90 capsule, Rfl: 0   NUCYNTA 75 MG tablet, Take 75 mg by mouth every 6 (six) hours as needed., Disp: , Rfl:    ondansetron (ZOFRAN-ODT) 8 MG disintegrating tablet, Take 8 mg by mouth every 8 (eight) hours as needed., Disp: , Rfl:    RESTASIS 0.05 % ophthalmic emulsion, Place 1 drop into both eyes 2 (two) times daily., Disp: , Rfl:    ursodiol (ACTIGALL) 300 MG capsule, Take 300 mg by mouth 2 (two) times daily., Disp: , Rfl:   Social History: Social History   Tobacco Use   Smoking status: Never   Smokeless tobacco: Never  Vaping Use   Vaping status: Never Used  Substance Use Topics   Alcohol use: Yes    Comment: occasion    Drug use: No    Family Medical History: Family History  Problem Relation Age of Onset   Fibromyalgia Mother    Anxiety disorder Mother    Depression Mother    Depression Father    Anxiety disorder Father     Physical Examination: Vitals:   10/12/23 1143  BP: 112/78    General: Patient is in no apparent distress. Attention to examination is appropriate.  Neck:   Supple.  Full range of motion.  Respiratory: Patient is breathing without any difficulty.   NEUROLOGICAL:     Awake, alert, oriented to person, place, and time.  Speech is clear and fluent.   Cranial Nerves: Pupils equal round and reactive to light.  Facial tone is symmetric.  Facial sensation is symmetric. Shoulder shrug is symmetric. Tongue protrusion is midline.    Strength: Moves UE well  Side Iliopsoas Quads Hamstring PF DF EHL  R 5 5 5 5 5 5   L 5 5 5 5 5 5    Reflexes are 1+ and symmetric at the biceps, triceps, brachioradialis, patella and achilles.   Hoffman's is absent.   Bilateral upper and lower extremity sensation is intact to light touch with some lowered sensation in RLE in L4 distribution.    No evidence of dysmetria noted.  Gait is antalgic.     Medical Decision Making  Imaging: MRI L spine 04/01/2023 IMPRESSION: 1. Progressive adjacent level moderate right foraminal stenosis at L4-5. 2. Mild subarticular narrowing bilaterally at L4-5 is worse on the right. 3. Mild left foraminal narrowing at L4-5 is stable. 4. Fusion at L5-S1 without residual or recurrent stenosis. 5. Mild disc bulging at L2-3 and L3-4 without significant stenosis.     Electronically Signed   By: Audree Leas M.D.   On: 04/13/2023 15:24  Flex Ext Xrays 08/23/2022  IMPRESSION:  Postoperative changes, status post L5-S1 lumbosacral fusion.  Minimal retrolisthesis of L4 on L5, which appears mildly accentuated on lateral extension view.  Mild multilevel degenerative changes.    Electronically Signed by:  Neoma Banker, MD on 08/26/2022 12:30 PM   Please note that I have measured her retrolisthesis at L4-5.  She has 6.3 mm of retrolisthesis on extension that reduces to approximately 1.7 mm on flexion.  I have personally reviewed the images and agree with the above interpretation.  Assessment and Plan: Ms. Dalby is a pleasant 38 y.o. 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  She has foraminal stenosis secondary to this.  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  She had an excellent response to stimulator trial.  She is a good candidate for placement of a permanent device.  We will proceed with placement of a spinal cord stimulator.  I discussed the planned procedure at length with the patient, including the risks, benefits, alternatives, and indications. The risks discussed include but are not limited to bleeding, infection, need for reoperation, spinal fluid leak, stroke, vision loss, anesthetic complication, coma, paralysis, and even death. I also described in detail that improvement was not guaranteed.  The patient expressed understanding of these risks, and asked that we proceed with surgery. I described the surgery in layman's terms, and gave ample opportunity for questions, which were answered to the best of my ability.   I spent a total of 15 minutes in this patient's care today. This time was spent reviewing pertinent records including imaging studies, obtaining and confirming history, performing a directed evaluation, formulating and discussing my recommendations, and documenting the visit within the medical record.    Thank you for involving me in the care of this patient.      Medardo Hassing K. Mont Antis MD, [facility] Neurosurgery

## 2023-10-12 NOTE — H&P (View-Only) (Signed)
 Referring Physician:  Margarita Mail, DO [address] [place],  [place] [address]  Primary Physician:  Margarita Mail, DO  History of Present Illness: 10/12/2023 Peggy Ruiz presents today to discuss placement of spinal cord stimulator.  She had an excellent trial with 85% improvement in symptoms.  07/11/2023 Peggy Ruiz returns to see me.  She continues to have significant back pain.  She has thought the options over, would like to consider spinal cord stimulator evaluation.   05/09/2023 Peggy Ruiz is here today with a chief complaint of back and right leg pain.  This is well discussed below.  She has tried and failed conservative management.   The symptoms are causing a significant impact on the patient's life.   I have utilized the care everywhere function in epic to review the outside records available from external health systems.  History of Present Illness: Notes from Peggy Leach PA-C Peggy Ruiz is a 38 y.o. female has a history of cervical CA, PTSD, GERD, lyme disease, migraines, pelvic pain, hypothyroidism, FM, and chronic back pain.    She had hysterectomy for cervical CA on Friday 04/14/23.    History of lumbar fusion L5-S1 on 11/24/2016 Dr. Estill Bakes. This helped her right leg pain but she continued with chronic back pain.    Last seen by me on 03/28/23 for constant LBP and intermittent right leg pain. 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 She has some weakness in right foot with DF/PF/EHL.    Phone visit to review her lumbar MRI results.    She is doing well after her hysterectomy on Friday.    She continues with constant LBP with intermittent right anterior/lateral leg pain to her foot that is getting worse. Her right foot is numb when she has pain in the right leg- this is new. She has weakness in right foot as well. She still has numbness in right leg with walking.  Pain is sharp and feels electrical. No left leg pain. Pain is worse with prolonged sitting/standing/walking. She is using a cane.    She was prescribed SUBUTEX from Dr. Ethelene Hal- has not started it yet. Was on nycenta and insurance stopped covering it.    Bowel/Bladder Dysfunction: history of chronic urinary urgency (has seen urology), no bowel incontinence.    Conservative measures:  Physical therapy: did in last year at [facility] in [place] Multimodal medical therapy including regular antiinflammatories: flexeril, neurontin, subutex, nycenta Injections:  Had ESIs at [facility] in [place]- last was in August right L4 TF ESI on 12/23/21   Past Surgery:  revision L5-S1, L4-L5 discectomy and TLIF L5-S1 on 11/24/16.  She's had multiple other lumbar surgeries (2 discectomies?)  Review of Systems:  A 10 point review of systems is negative, except for the pertinent positives and negatives detailed in the HPI.  Past Medical History: Past Medical History:  Diagnosis Date   Anorexia    at 67   Anxiety    Attention deficit disorder (ADD)    Bone spur    Depression    Eczema    HPV (human papilloma virus) infection    Thyroid disease     Past Surgical History: Past Surgical History:  Procedure Laterality Date   ABDOMINAL HYSTERECTOMY     BACK SURGERY     3 different surgeries   cervical cone ciopsy     EYE SURGERY     for lazy eye  GALLBLADDER SURGERY     HERNIA REPAIR      Allergies: Allergies as of 10/12/2023 - Review Complete 10/12/2023  Allergen Reaction Noted   Benzyl alcohol Hives and Other (See Comments) 06/25/2015   Benzoin Hives and Other (See Comments) 08/11/2022   Chlorhexidine Rash 07/24/2023   Promethazine  02/11/2016   Scopolamine Rash 08/11/2022   Ciprofloxacin Other (See Comments) 02/07/2012   Dilaudid [hydromorphone]  04/07/2017   Epinephrine  02/25/2019   Penicillins Hives 02/07/2012   Toradol [ketorolac tromethamine] Nausea And Vomiting  04/07/2017   Bactrim [sulfamethoxazole-trimethoprim] Rash 05/09/2023    Medications:  Current Outpatient Medications:    buPROPion (WELLBUTRIN XL) 150 MG 24 hr tablet, Take 1 tablet (150 mg total) by mouth daily., Disp: 90 tablet, Rfl: 1   clonazePAM (KLONOPIN) 0.5 MG tablet, Take 1 tablet (0.5 mg total) by mouth 2 (two) times daily AND 2 tablets (1 mg total) at bedtime., Disp: 360 tablet, Rfl: 2   gabapentin (NEURONTIN) 800 MG tablet, Take 800 mg by mouth 2 (two) times daily., Disp: , Rfl:    itraconazole (SPORANOX) 100 MG capsule, Take 2 capsules (200 mg total) by mouth daily., Disp: 168 capsule, Rfl: 0   lansoprazole (PREVACID) 30 MG capsule, Take 30 mg by mouth 2 (two) times daily before a meal., Disp: , Rfl:    levothyroxine (SYNTHROID) 50 MCG tablet, Take 1 tablet (50 mcg total) by mouth daily., Disp: 90 tablet, Rfl: 0   loratadine (CLARITIN) 10 MG tablet, Take by mouth daily as needed., Disp: , Rfl:    Magnesium Oxide, Laxative, 500 MG TABS, , Disp: , Rfl:    methocarbamol (ROBAXIN) 750 MG tablet, Take 1 tablet 3 times a day by oral route as needed for 30 days., Disp: , Rfl:    Multiple Vitamin (MULTIVITAMIN) tablet, Take 1 tablet by mouth daily., Disp: , Rfl:    nortriptyline (PAMELOR) 10 MG capsule, Take 1 capsule (10 mg total) by mouth at bedtime., Disp: 90 capsule, Rfl: 0   NUCYNTA 75 MG tablet, Take 75 mg by mouth every 6 (six) hours as needed., Disp: , Rfl:    ondansetron (ZOFRAN-ODT) 8 MG disintegrating tablet, Take 8 mg by mouth every 8 (eight) hours as needed., Disp: , Rfl:    RESTASIS 0.05 % ophthalmic emulsion, Place 1 drop into both eyes 2 (two) times daily., Disp: , Rfl:    ursodiol (ACTIGALL) 300 MG capsule, Take 300 mg by mouth 2 (two) times daily., Disp: , Rfl:   Social History: Social History   Tobacco Use   Smoking status: Never   Smokeless tobacco: Never  Vaping Use   Vaping status: Never Used  Substance Use Topics   Alcohol use: Yes    Comment: occasion    Drug use: No    Family Medical History: Family History  Problem Relation Age of Onset   Fibromyalgia Mother    Anxiety disorder Mother    Depression Mother    Depression Father    Anxiety disorder Father     Physical Examination: Vitals:   10/12/23 1143  BP: 112/78    General: Patient is in no apparent distress. Attention to examination is appropriate.  Neck:   Supple.  Full range of motion.  Respiratory: Patient is breathing without any difficulty.   NEUROLOGICAL:     Awake, alert, oriented to person, place, and time.  Speech is clear and fluent.   Cranial Nerves: Pupils equal round and reactive to light.  Facial tone is symmetric.  Facial sensation is symmetric. Shoulder shrug is symmetric. Tongue protrusion is midline.    Strength: Moves UE well  Side Iliopsoas Quads Hamstring PF DF EHL  R 5 5 5 5 5 5   L 5 5 5 5 5 5    Reflexes are 1+ and symmetric at the biceps, triceps, brachioradialis, patella and achilles.   Hoffman's is absent.   Bilateral upper and lower extremity sensation is intact to light touch with some lowered sensation in RLE in L4 distribution.    No evidence of dysmetria noted.  Gait is antalgic.     Medical Decision Making  Imaging: MRI L spine 04/01/2023 IMPRESSION: 1. Progressive adjacent level moderate right foraminal stenosis at L4-5. 2. Mild subarticular narrowing bilaterally at L4-5 is worse on the right. 3. Mild left foraminal narrowing at L4-5 is stable. 4. Fusion at L5-S1 without residual or recurrent stenosis. 5. Mild disc bulging at L2-3 and L3-4 without significant stenosis.     Electronically Signed   By: Audree Leas M.D.   On: 04/13/2023 15:24  Flex Ext Xrays 08/23/2022  IMPRESSION:  Postoperative changes, status post L5-S1 lumbosacral fusion.  Minimal retrolisthesis of L4 on L5, which appears mildly accentuated on lateral extension view.  Mild multilevel degenerative changes.    Electronically Signed by:  Neoma Banker, MD on 08/26/2022 12:30 PM   Please note that I have measured her retrolisthesis at L4-5.  She has 6.3 mm of retrolisthesis on extension that reduces to approximately 1.7 mm on flexion.  I have personally reviewed the images and agree with the above interpretation.  Assessment and Plan: Peggy Ruiz is a pleasant 38 y.o. 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  She has foraminal stenosis secondary to this.  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  She had an excellent response to stimulator trial.  She is a good candidate for placement of a permanent device.  We will proceed with placement of a spinal cord stimulator.  I discussed the planned procedure at length with the patient, including the risks, benefits, alternatives, and indications. The risks discussed include but are not limited to bleeding, infection, need for reoperation, spinal fluid leak, stroke, vision loss, anesthetic complication, coma, paralysis, and even death. I also described in detail that improvement was not guaranteed.  The patient expressed understanding of these risks, and asked that we proceed with surgery. I described the surgery in layman's terms, and gave ample opportunity for questions, which were answered to the best of my ability.   I spent a total of 15 minutes in this patient's care today. This time was spent reviewing pertinent records including imaging studies, obtaining and confirming history, performing a directed evaluation, formulating and discussing my recommendations, and documenting the visit within the medical record.    Thank you for involving me in the care of this patient.      Medardo Hassing K. Mont Antis MD, [facility] Neurosurgery

## 2023-10-12 NOTE — Patient Instructions (Addendum)
 Please see below for information in regards to your upcoming surgery:  Planned surgery: Thoracic Laminectomy for Spinal Cord Stimulator placement    Surgery date: 10/23/2023 at [facility] (Medical Mall: [address], [place], [place] [address]) - you will find out your arrival time the business day before your surgery.  Pre-op appointment at [facility] Pre-admit Testing: you will receive a call with a date/time for this appointment. If you are scheduled for an in person appointment, Pre-admit Testing is located on the first floor of the Medical Arts building, [address], [address]. During this appointment, they will advise you which medications you can take the morning of surgery, and which medications you will need to hold for surgery. Labs (such as blood work, EKG) may be done at your pre-op appointment. You are not required to fast for these labs. Should you need to change your pre-op appointment, please call Pre-admit testing at [phone].   Common restrictions after surgery: No bending, lifting, or twisting ("BLT"). Avoid lifting objects heavier than 10 pounds for the first 6 weeks after surgery. Where possible, avoid household activities that involve lifting, bending, reaching, pushing, or pulling such as laundry, vacuuming, grocery shopping, and childcare. Try to arrange for help from friends and family for these activities while you heal. Do not drive while taking prescription pain medication. Weeks 6 through 12 after surgery: avoid lifting more than 25 pounds.   How to contact us:  If you have any questions/concerns before or after surgery, you can reach [place] at [phone], or you can send a mychart message. We can be reached by phone or mychart 8am-4pm, Monday-Friday.  *Please note: Calls after 4pm are forwarded to a third party answering service. Mychart messages are not routinely monitored during evenings, weekends, and holidays. Please call  our office to contact the answering service for urgent concerns during non-business hours.  Appointments/FMLA & disability paperwork: Joycelyn Rua, & Flonnie Hailstone Registered Nurses/Surgery schedulers: Ilsa Iha Medical Assistants: Nash Mantis Physician Assistants: Joan Flores, PA-C, Manning Charity, PA-C & Drake Leach, PA-C Surgeons: Venetia Night, MD & Ernestine Mcmurray, MD   [facility] PREADMIT TESTING VISIT and SURGERY INFORMATION SHEET   Now that surgery has been scheduled you can anticipate several phone calls from [facility] services. A pharmacy technician will call you to verify your current list of medications taken at home.               The Pre-Service Center will call to verify your insurance information and to give you billing estimates and information.             The Preadmit Testing Office will be calling to schedule a visit to obtain information for the anesthesia team and provide instructions on preparation for surgery.  What can you expect for the Preadmit Testing Visit: Appointments may be scheduled in-person or by telephone.  If a telephone visit is scheduled, you may be asked to come into the office to have lab tests or other studies performed.   This visit will not be completed any greater than 14 days prior to your surgery.  If your surgery has been scheduled for a future date, please do not be alarmed if we have not contacted you to schedule an appointment more than a month prior to the surgery date.    Please be prepared to provide the following information during this appointment:            -  Personal medical history                                               -Medication and allergy list            -Any history of problems with anesthesia              -Recent lab work or diagnostic studies            -Please notify us  of any needs we should be aware of to provide the best care possible           -You will be provided with  instructions on how to prepare for your surgery.    On The Day of Surgery:  You must have a driver to take you home after surgery, you will be asked not to drive for 24 hours following surgery.  Taxi, [employer] and non-medical transport will not be acceptable means of transportation unless you have a responsible individual who will be traveling with you.  Visitors in the surgical area:   2 people will be able to visit you in your room once your preparation for surgery has been completed. During surgery, your visitors will be asked to wait in the Surgery Waiting Area.  It is not a requirement for them to stay, if they prefer to leave and come back.  Your visitor(s) will be given an update once the surgery has been completed.  No visitors are allowed in the initial recovery room to respect patient privacy and safety.  Once you are more awake and transfer to the secondary recovery area, or are transferred to an inpatient room, visitors will again be able to see you.  To respect and protect your privacy: We will ask on the day of surgery who your driver will be and what the contact number for that individual will be. We will ask if it is okay to share information with this individual, or if there is an alternative individual that we, or the surgeon, should contact to provide updates and information. If family or friends come to the surgical information desk requesting information about you, who you have not listed with us , no information will be given.   It may be helpful to designate someone as the main contact who will be responsible for updating your other friends and family.    PREADMIT TESTING OFFICE: [phone] SAME DAY SURGERY: [phone] We look forward to caring for you before and throughout the process of your surgery.

## 2023-10-13 ENCOUNTER — Encounter: Payer: Self-pay | Admitting: Internal Medicine

## 2023-10-18 ENCOUNTER — Encounter
Admission: RE | Admit: 2023-10-18 | Discharge: 2023-10-18 | Disposition: A | Discharge status: Home / Self Care | Source: Ambulatory Visit | Attending: Neurosurgery | Admitting: Neurosurgery

## 2023-10-18 ENCOUNTER — Telehealth: Payer: Self-pay

## 2023-10-18 ENCOUNTER — Other Ambulatory Visit: Payer: Self-pay

## 2023-10-18 VITALS — BP 111/85 | HR 86 | Resp 12 | Ht 68.0 in | Wt 130.9 lb

## 2023-10-18 DIAGNOSIS — Z01812 Encounter for preprocedural laboratory examination: Secondary | ICD-10-CM | POA: Insufficient documentation

## 2023-10-18 DIAGNOSIS — Z01818 Encounter for other preprocedural examination: Secondary | ICD-10-CM

## 2023-10-18 HISTORY — DX: Malignant neoplasm of cervix uteri, unspecified: C53.9

## 2023-10-18 HISTORY — DX: Other complications of anesthesia, initial encounter: T88.59XA

## 2023-10-18 HISTORY — DX: Spinal stenosis, lumbar region without neurogenic claudication: M48.061

## 2023-10-18 HISTORY — DX: Prediabetes: R73.03

## 2023-10-18 HISTORY — DX: Nausea with vomiting, unspecified: R11.2

## 2023-10-18 HISTORY — DX: Other intervertebral disc degeneration, lumbar region without mention of lumbar back pain or lower extremity pain: M51.369

## 2023-10-18 HISTORY — DX: Nummular dermatitis: L30.0

## 2023-10-18 HISTORY — DX: Post-traumatic stress disorder, unspecified: F43.10

## 2023-10-18 HISTORY — DX: Dry eye syndrome of bilateral lacrimal glands: H04.123

## 2023-10-18 HISTORY — DX: Hypothyroidism, unspecified: E03.9

## 2023-10-18 HISTORY — DX: Unspecified mood (affective) disorder: F39

## 2023-10-18 HISTORY — DX: Gastro-esophageal reflux disease without esophagitis: K21.9

## 2023-10-18 HISTORY — DX: Chronic pain syndrome: G89.4

## 2023-10-18 HISTORY — DX: Gastroparesis: K31.84

## 2023-10-18 HISTORY — DX: Anemia, unspecified: D64.9

## 2023-10-18 HISTORY — DX: Lyme disease, unspecified: A69.20

## 2023-10-18 HISTORY — DX: Endometriosis, unspecified: N80.9

## 2023-10-18 HISTORY — DX: Nausea with vomiting, unspecified: Z98.890

## 2023-10-18 LAB — TYPE AND SCREEN
ABO/RH(D): A NEG
Antibody Screen: NEGATIVE

## 2023-10-18 LAB — SURGICAL PCR SCREEN
MRSA, PCR: NEGATIVE
Staphylococcus aureus: NEGATIVE

## 2023-10-18 NOTE — Patient Instructions (Addendum)
 Your procedure is scheduled on:10-23-23 Monday Report to the Registration Desk on the 1st floor of the Medical Mall.Then proceed to the 2nd floor Surgery Desk To find out your arrival time, please call [phone] between 1PM - 3PM on:10-20-23 Friday If your arrival time is 6:00 am, do not arrive before that time as the Medical Mall entrance doors do not open until 6:00 am.  REMEMBER: Instructions that are not followed completely may result in serious medical risk, up to and including death; or upon the discretion of your surgeon and anesthesiologist your surgery may need to be rescheduled.  Do not eat food after midnight the night before surgery.  No gum chewing or hard candies.  You may however, drink CLEAR liquids up to 2 hours before you are scheduled to arrive for your surgery. Do not drink anything within 2 hours of your scheduled arrival time.  Clear liquids include: - water  - apple juice without pulp - gatorade (not RED colors) - black coffee or tea (Do NOT add milk or creamers to the coffee or tea) Do NOT drink anything that is not on this list.  One week prior to surgery:Stop NOW (10-18-23) Stop ANY OVER THE COUNTER supplements until after surgery (Vitamin D3, Magnesium, Multivitamin)  Continue taking all of your other prescription medications up until the day of surgery.  ON THE DAY OF SURGERY ONLY TAKE THESE MEDICATIONS WITH SIPS OF WATER: -brexpiprazole  (REXULTI )  -buPROPion  (WELLBUTRIN  XL)  -clonazePAM  (KLONOPIN )  -gabapentin (NEURONTIN)  -lansoprazole (PREVACID)  -levothyroxine  (SYNTHROID )  -ursodiol (ACTIGALL)  -You may take cloNIDine  (CATAPRES ) if needed for anxiety  No Alcohol for 24 hours before or after surgery.  No Smoking including e-cigarettes for 24 hours before surgery.  No chewable tobacco products for at least 6 hours before surgery.  No nicotine patches on the day of surgery.  Do not use any "recreational" drugs for at least a week (preferably 2  weeks) before your surgery.  Please be advised that the combination of cocaine and anesthesia may have negative outcomes, up to and including death. If you test positive for cocaine, your surgery will be cancelled.  On the morning of surgery brush your teeth with toothpaste and water, you may rinse your mouth with mouthwash if you wish. Do not swallow any toothpaste or mouthwash.  Use Anti-bacterial Soap (Dial) x 5 days-Start on 4-24 (Thursday) with your 5th shower being the morning of surgery  Do not wear jewelry, make-up, hairpins, clips or nail polish.  For welded (permanent) jewelry: bracelets, anklets, waist bands, etc.  Please have this removed prior to surgery.  If it is not removed, there is a chance that hospital personnel will need to cut it off on the day of surgery.  Do not wear lotions, powders, or perfumes.   Do not shave body hair from the neck down 48 hours before surgery.  Contact lenses, hearing aids and dentures may not be worn into surgery.  Do not bring valuables to the hospital. [facility] is not responsible for any missing/lost belongings or valuables.  Notify your doctor if there is any change in your medical condition (cold, fever, infection).  Wear comfortable clothing (specific to your surgery type) to the hospital.  After surgery, you can help prevent lung complications by doing breathing exercises.  Take deep breaths and cough every 1-2 hours. Your doctor may order a device called an Incentive Spirometer to help you take deep breaths. When coughing or sneezing, hold a pillow firmly against  your incision with both hands. This is called "splinting." Doing this helps protect your incision. It also decreases belly discomfort.  If you are being admitted to the hospital overnight, leave your suitcase in the car. After surgery it may be brought to your room.  In case of increased patient census, it may be necessary for you, the patient, to continue your  postoperative care in the Same Day Surgery department.  If you are being discharged the day of surgery, you will not be allowed to drive home. You will need a responsible individual to drive you home and stay with you for 24 hours after surgery.   If you are taking public transportation, you will need to have a responsible individual with you.  Please call the Pre-admissions Testing Dept. at [phone] if you have any questions about these instructions.  Surgery Visitation Policy:  Patients having surgery or a procedure may have two visitors.  Children under the age of 33 must have an adult with them who is not the patient.

## 2023-10-18 NOTE — Telephone Encounter (Signed)
 I called the patient to make her aware of a pending code that needs to be authorized for her surgery on 10/23/2023. Patient was not aware her surgery was fully aware.   Patient will call her insurance as will we from our end to see if we can get this approved by 4/25 at mid-day to prevent post-phoning this surgery.   Patient is aware that code C1820 is for the battery for her SCS and lack of approval for this code may result in her surgery being pushed back.   Kendeyln J RN is calling [employer] at this time to request a decision about this codes approval.

## 2023-10-18 NOTE — Telephone Encounter (Addendum)
 10/18/23 at 11:55am: I received a call from member services stating Mrs Ammar called them about their surgery not being approved yet. They advised me to request the case be expedited. I explained that I already did this. They informed me they would let the patient know.  10/18/23 at 1:15pm: Received a call from member stating she was told our office needs to do a peer to peer. We have not been notified of this. I then called the healthplan and spoke with Cornelius Dill at 1:27pm and he stated that the case is still in review and there is nothing in the notes indicating that a peer to peer needs to be done. Ref# [id number]. He offered to transfer me to the case manager to confirm. I spoke with Valinda Gault, who also confirmed that the case is pending and there is nothing stating a peer to peer is needed. She also stated that the nurse Candice Chalet is working on it and will call as soon as she is done with it.  I called the patient to update her of the above.

## 2023-10-18 NOTE — Telephone Encounter (Signed)
 10/18/23 at 11:30am: I called the [employer]plan to inquire about code C1820 that is still pending with [employer]. I spoke with Josephine Nicolas [facility] phone # [phone]. I requested that they expedite the request because the other codes are already approved through [employer] and the thing we are waiting on is approval for C1820 from them. I was informed that the expedited request can take 48-72 hours. Call ref# [id number]

## 2023-10-19 ENCOUNTER — Ambulatory Visit (INDEPENDENT_AMBULATORY_CARE_PROVIDER_SITE_OTHER): Payer: BC Managed Care – PPO | Admitting: Psychiatry

## 2023-10-19 DIAGNOSIS — F411 Generalized anxiety disorder: Secondary | ICD-10-CM

## 2023-10-19 NOTE — Progress Notes (Signed)
 [facility] Counselor/Therapist Progress Note  Patient ID: Peggy Ruiz, MRN: [id number],    Date: 10/19/2023  Time Spent: 59 minutes start time 1:01 PM end time 2:00 PM  Treatment Type: Individual Therapy  Reported Symptoms: panic, anxiety, nauseated, triggered responses, melt downs, irritability, pain, rumination  Mental Status Exam:  Appearance:   Well Groomed     Behavior:  Appropriate  Motor:  Normal  Speech/Language:   Pressured  Affect:  Appropriate  Mood:  angry and anxious  Thought process:  circumstantial  Thought content:    WNL  Sensory/Perceptual disturbances:    Pain  Orientation:  oriented to person, place, time/date, and situation  Attention:  Good  Concentration:  Good  Memory:  WNL  Fund of knowledge:   Good  Insight:    Good  Judgment:   Good  Impulse Control:  Good   Risk Assessment: Danger to Self:  No Self-injurious Behavior: No Danger to Others: No Duty to Warn:no Physical Aggression / Violence:No  Access to Firearms a concern: No  Gang Involvement:No   Subjective: Patient was present for session. She shared that the trial for the spinal cord stimulator went well. She shared that the insurance company approved all the procedure except for the battery that makes it work. She shared that she was able to stop her pain pills while she was on the trial. She ended up going through withdraw from her pain pills so she couldn't eat and was having multiple break downs and it was like when she had all of her panic. Now her pain is through the roof and she has fallen multiple times due to the pain. She has been 2 weeks without the pain meds. If she does not get the approval by tomorrow she won't have the surgery so she  is having a hard time.  Patient explained being very panicked over the whole situation and is not sure if she will manage if she does not get the approval.  During session the doctor's office called and shared she did have the approval for  the surgery and would be getting her spinal cord stimulator on Monday.  Patient was able to feel much relief.  Discussed how to prepare for the surgery since it will be 6 weeks where she can do anything physically.  Patient acknowledged that when she sits too long her brain starts going in a negative direction.  Brainstormed different things that she could do to keep her brain engaged in positive activities so that she does not ruminate while she is in recovery which can lead to a downward spiral for her.  Patient was encouraged to think through all the things of work that out and how things have progressed in a positive direction over the past 3 years so she can start feeling better about how things are moving.  Patient was able to develop some plans that she felt positive and reported feeling much better at the end of session.  Interventions: Cognitive Behavioral Therapy, Solution-Oriented/Positive Psychology, and Insight-Oriented  Diagnosis:   ICD-10-CM   1. Generalized anxiety disorder  F41.1       Plan: Patient is to use CBT and coping skills to decrease triggered responses.  Patient is to follow plans from session to get her brain focused on positive activities especially after her surgery.  Patient is to remind herself of all the progress she has made in treatment and to journal her insights.  Patient is to focus  on what she can control fix and change she is to start writing them down and reading in them as she needs to.  Patient is to work on affirming herself regularly that she is safe and thinking through what to do to help her feel safe.  Patient is to start looking into the possibility of a service dog.  Patient is to work on Psychologist, sport and exercise on her mirror to repeat regularly.  Patient is to take things in small steps so that she is able to accomplish tasks.  Patient is to take medication as directed.   Patient is to work on focusing on the things that she can accomplish.  Patient is to  work on Dr. Deadra Everts leaf on the neuro cycle detox.  Patient is to continue working with providers on medical issues   Marlise Simpers, LCMHC

## 2023-10-22 ENCOUNTER — Ambulatory Visit
Admission: EM | Admit: 2023-10-22 | Discharge: 2023-10-22 | Disposition: A | Discharge status: Home / Self Care | Attending: Emergency Medicine | Admitting: Emergency Medicine

## 2023-10-22 DIAGNOSIS — R102 Pelvic and perineal pain: Secondary | ICD-10-CM | POA: Diagnosis not present

## 2023-10-22 LAB — POCT URINALYSIS DIP (MANUAL ENTRY)
Bilirubin, UA: NEGATIVE
Glucose, UA: NEGATIVE mg/dL
Ketones, POC UA: NEGATIVE mg/dL
Nitrite, UA: NEGATIVE
Protein Ur, POC: NEGATIVE mg/dL
Spec Grav, UA: 1.02
Urobilinogen, UA: 0.2 U/dL
pH, UA: 6

## 2023-10-22 MED ORDER — CEFAZOLIN IN SODIUM CHLORIDE 2-0.9 GM/100ML-% IV SOLN
2.0000 g | Freq: Once | INTRAVENOUS | Status: DC
  Filled 2023-10-22: qty 100

## 2023-10-22 MED ORDER — VANCOMYCIN HCL IN DEXTROSE 1-5 GM/200ML-% IV SOLN
1000.0000 mg | Freq: Once | INTRAVENOUS | Status: AC
Start: 2023-10-22 — End: 2023-10-23
  Administered 2023-10-23: 1000 mg via INTRAVENOUS

## 2023-10-22 MED ORDER — LACTATED RINGERS IV SOLN
INTRAVENOUS | Status: DC
Start: 2023-10-22 — End: 2023-10-23

## 2023-10-22 MED ORDER — CEPHALEXIN 500 MG PO CAPS: 500.0000 mg | ORAL_CAPSULE | Freq: Four times a day (QID) | ORAL | 0 refills | Status: DC

## 2023-10-22 MED ORDER — CEFAZOLIN SODIUM-DEXTROSE 2-4 GM/100ML-% IV SOLN: 2.0000 g | INTRAVENOUS | Status: DC

## 2023-10-22 MED ORDER — CEFAZOLIN SODIUM-DEXTROSE 2-4 GM/100ML-% IV SOLN
2.0000 g | INTRAVENOUS | Status: AC
  Administered 2023-10-23: 2 g via INTRAVENOUS

## 2023-10-22 NOTE — ED Provider Notes (Signed)
 [facility]    CSN: [id number] Arrival date & time: 10/22/23  1338      History   Chief Complaint Chief Complaint  Patient presents with   Dysuria    HPI Peggy Ruiz is a 38 y.o. female.   Patient presents for evaluation of urinary frequency and lower abdominal pressure beginning this morning.  Endorses that she has spinal surgery tomorrow and was told to come be checked for urinary infection.  Denies hematuria, dysuria, flank pain, fever or vaginal symptoms.  Has not attempted treatment.  Past Medical History:  Diagnosis Date   Adenocarcinoma of cervix (HCC)    Anemia    Anorexia    at 15   Anxiety    Attention deficit disorder (ADD)    Bone spur    Bulging lumbar disc    Chronic pain syndrome    Chronically dry eyes, bilateral    Complication of anesthesia    delayed emergence   Depression    Eczema    Endometriosis    Gastroparesis    GERD (gastroesophageal reflux disease)    HPV (human papilloma virus) infection    Hypothyroidism    Lyme disease    Mild mood disorder (HCC)    Nummular eczema    PONV (postoperative nausea and vomiting)    Pre-diabetes    PTSD (post-traumatic stress disorder)    Spinal stenosis of lumbar region    Thyroid disease     Patient Active Problem List   Diagnosis Date Noted   Hypothyroidism 08/24/2023   History of prediabetes 08/24/2023   Gastroparesis 08/24/2023   Spinal stenosis of lumbar region 08/24/2023   Adenocarcinoma of cervix (HCC) 08/24/2023   Insomnia 05/21/2018   Abdominal pain 04/04/2018   Posttraumatic stress disorder 03/31/2018   Mild mood disorder (HCC) 03/31/2018   Pelvic pain in female 10/02/2017   Other malaise and fatigue 03/14/2013   Contraceptive management 03/14/2013    Past Surgical History:  Procedure Laterality Date   ABDOMINAL HYSTERECTOMY     BACK SURGERY     3 different surgeries-lumbar   cervical cone ciopsy     CHOLECYSTECTOMY     DIAGNOSTIC LAPAROSCOPY     for  endometriosis   EYE SURGERY     for lazy eye   HERNIA REPAIR      OB History   No obstetric history on file.      Home Medications    Prior to Admission medications   Medication Sig Start Date End Date Taking? Authorizing Provider  cephALEXin  (KEFLEX ) 500 MG capsule Take 1 capsule (500 mg total) by mouth 4 (four) times daily. 10/22/23  Yes June Vacha R, NP  brexpiprazole  (REXULTI ) 1 MG TABS tablet Take 1 mg by mouth every morning.    [provider]  buPROPion  (WELLBUTRIN  XL) 150 MG 24 hr tablet Take 1 tablet (150 mg total) by mouth daily. Patient taking differently: Take 150 mg by mouth every morning. 06/29/23 10/18/23  Roselyn Connor, PMHNP  cholecalciferol (VITAMIN D3) 25 MCG (1000 UNIT) tablet Take 1,000 Units by mouth daily.    [provider]  clonazePAM  (KLONOPIN ) 0.5 MG tablet Take 1 tablet (0.5 mg total) by mouth 2 (two) times daily AND 2 tablets (1 mg total) at bedtime. Patient taking differently: Take 1 tablet (0.5 mg total) by mouth tid-1 tab in am 1 in the afternoon and 2 tablets (1 mg total) at bedtime. 07/13/23 10/18/23  Roselyn Connor, PMHNP  cloNIDine  (CATAPRES ) 0.1 MG  tablet Take 0.1 mg by mouth 2 (two) times daily as needed (anxiety). 10/11/23   [provider]  docusate sodium (COLACE) 100 MG capsule Take 100-300 mg by mouth at bedtime as needed for mild constipation.    [provider]  gabapentin (NEURONTIN) 800 MG tablet Take 800 mg by mouth 2 (two) times daily.    [provider]  itraconazole  (SPORANOX ) 100 MG capsule Take 2 capsules (200 mg total) by mouth daily. Patient taking differently: Take 200 mg by mouth every morning. 08/02/23 10/25/23  Floyce Hutching, DPM  lansoprazole (PREVACID) 30 MG capsule Take 30 mg by mouth 2 (two) times daily before a meal. 10/13/22   [provider]  levothyroxine  (SYNTHROID ) 50 MCG tablet Take 1 tablet (50 mcg total) by mouth daily. Patient taking differently: Take 50 mcg by  mouth daily before breakfast. 06/05/23   Rockney Cid, DO  loratadine (CLARITIN) 10 MG tablet Take 10 mg by mouth daily as needed for allergies.    [provider]  Magnesium Oxide, Laxative, 500 MG TABS Take 1,500 mg by mouth at bedtime. 06/05/23   Rockney Cid, DO  methocarbamol (ROBAXIN) 750 MG tablet Take 750 mg by mouth 3 (three) times daily as needed for muscle spasms. 06/12/23   [provider]  Multiple Vitamin (MULTIVITAMIN) tablet Take 1 tablet by mouth daily.    [provider]  nortriptyline  (PAMELOR ) 10 MG capsule Take 1 capsule (10 mg total) by mouth at bedtime. 02/21/20   Roselyn Connor, PMHNP  NUCYNTA 75 MG tablet Take 75 mg by mouth every 6 (six) hours as needed for moderate pain (pain score 4-6). 12/23/22   [provider]  ondansetron  (ZOFRAN -ODT) 8 MG disintegrating tablet Take 8 mg by mouth every 8 (eight) hours as needed for vomiting or nausea. 08/01/22   [provider]  RESTASIS 0.05 % ophthalmic emulsion Place 1 drop into both eyes 2 (two) times daily. 08/29/23   [provider]  ursodiol (ACTIGALL) 300 MG capsule Take 300 mg by mouth 2 (two) times daily. 11/27/22   [provider]    Family History Family History  Problem Relation Age of Onset   Fibromyalgia Mother    Anxiety disorder Mother    Depression Mother    Depression Father    Anxiety disorder Father     Social History Social History   Tobacco Use   Smoking status: Never   Smokeless tobacco: Never  Vaping Use   Vaping status: Never Used  Substance Use Topics   Alcohol use: Yes    Comment: rare   Drug use: No     Allergies   Benzyl alcohol, Benzoin, Chlorhexidine, Promethazine, Scopolamine, Ciprofloxacin, Cyanoacrylate, Dilaudid [hydromorphone], Epinephrine, Penicillins, Toradol  [ketorolac  tromethamine ], and Bactrim [sulfamethoxazole-trimethoprim]   Review of Systems Review of Systems   Physical Exam Triage Vital Signs ED  Triage Vitals [10/22/23 1359]  Encounter Vitals Group     BP 102/69     Systolic BP Percentile      Diastolic BP Percentile      Pulse Rate 79     Resp 16     Temp 97.8 F (36.6 C)     Temp Source Oral     SpO2 96 %     Weight      Height      Head Circumference      Peak Flow      Pain Score      Pain Loc  Pain Education      Exclude from Growth Chart    No data found.  Updated Vital Signs BP 102/69 (BP Location: Left Arm)   Pulse 79   Temp 97.8 F (36.6 C) (Oral)   Resp 16   LMP 03/02/2017 (Exact Date)   SpO2 96%   Visual Acuity Right Eye Distance:   Left Eye Distance:   Bilateral Distance:    Right Eye Near:   Left Eye Near:    Bilateral Near:     Physical Exam Constitutional:      Appearance: Normal appearance.  Eyes:     Extraocular Movements: Extraocular movements intact.  Pulmonary:     Effort: Pulmonary effort is normal.  Abdominal:     General: Abdomen is flat. Bowel sounds are normal.     Palpations: Abdomen is soft.     Tenderness: There is abdominal tenderness in the suprapubic area. There is no right CVA tenderness or left CVA tenderness.  Neurological:     Mental Status: She is alert and oriented to person, place, and time.     UC Treatments / Results  Labs (all labs ordered are listed, but only abnormal results are displayed) Labs Reviewed  POCT URINALYSIS DIP (MANUAL ENTRY) - Abnormal; Notable for the following components:      Result Value   Color, UA light yellow (*)    Clarity, UA cloudy (*)    Blood, UA moderate (*)    Leukocytes, UA Small (1+) (*)    All other components within normal limits  URINE CULTURE    EKG   Radiology No results found.  Procedures Procedures (including critical care time)  Medications Ordered in UC Medications - No data to display  Initial Impression / Assessment and Plan / UC Course  I have reviewed the triage vital signs and the nursing notes.  Pertinent labs & imaging results that  were available during my care of the patient were reviewed by me and considered in my medical decision making (see chart for details).  Acute suprapubic pain  Urinalysis negative for nitrates, sent for culture, prophylactically initiated medicine as she is symptomatic and has upcoming spinal surgery, prescribed cephalexin , has had some cyst within the past, recommended supportive care, to notify surgeon of newly added medication  Final Clinical Impressions(s) / UC Diagnoses   Final diagnoses:  Suprapubic pain, acute     Discharge Instructions      Your urinalysis at this time does not show any bacteria your urine will be sent to the lab to determine exactly which bacteria is present, if any changes need to be made to your medications you will be notified  Begin use of cephalexin  every 6 hours for 5 days, please ensure you notify intake team of use of medicine prior to surgery  You may use over-the-counter Azo to help minimize your symptoms until antibiotic removes bacteria, this medication will turn your urine orange  Increase your fluid intake through use of water  As always practice good hygiene, wiping front to back and avoidance of scented vaginal products to prevent further irritation  If symptoms continue to persist after use of medication or recur please follow-up with urgent care or your primary doctor as needed    ED Prescriptions     Medication Sig Dispense Auth. Provider   cephALEXin  (KEFLEX ) 500 MG capsule Take 1 capsule (500 mg total) by mouth 4 (four) times daily. 20 capsule Rowland Ericsson R, NP  PDMP not reviewed this encounter.   Peggy Canning, NP 10/22/23 1430

## 2023-10-22 NOTE — Discharge Instructions (Signed)
 Your urinalysis at this time does not show any bacteria your urine will be sent to the lab to determine exactly which bacteria is present, if any changes need to be made to your medications you will be notified  Begin use of cephalexin  every 6 hours for 5 days, please ensure you notify intake team of use of medicine prior to surgery  You may use over-the-counter Azo to help minimize your symptoms until antibiotic removes bacteria, this medication will turn your urine orange  Increase your fluid intake through use of water  As always practice good hygiene, wiping front to back and avoidance of scented vaginal products to prevent further irritation  If symptoms continue to persist after use of medication or recur please follow-up with urgent care or your primary doctor as needed

## 2023-10-22 NOTE — ED Triage Notes (Signed)
 Patient presents to UC for dysuria and freq since today. Has spine surgery tomorrow. Here to rule out UTI. Has not taking any OTC pain meds.

## 2023-10-23 ENCOUNTER — Encounter
Admission: RE | Disposition: A | Discharge status: Home / Self Care | Payer: Self-pay | Source: Home / Self Care | Attending: Neurosurgery

## 2023-10-23 ENCOUNTER — Encounter: Payer: Self-pay | Admitting: Neurosurgery

## 2023-10-23 ENCOUNTER — Other Ambulatory Visit: Payer: Self-pay

## 2023-10-23 ENCOUNTER — Telehealth: Payer: Self-pay | Admitting: Neurosurgery

## 2023-10-23 ENCOUNTER — Ambulatory Visit
Admission: RE | Admit: 2023-10-23 | Discharge: 2023-10-23 | Disposition: A | Discharge status: Home / Self Care | Attending: Neurosurgery | Admitting: Neurosurgery

## 2023-10-23 ENCOUNTER — Ambulatory Visit: Admitting: Registered Nurse

## 2023-10-23 ENCOUNTER — Ambulatory Visit

## 2023-10-23 ENCOUNTER — Ambulatory Visit: Payer: Self-pay | Admitting: Urgent Care

## 2023-10-23 DIAGNOSIS — G894 Chronic pain syndrome: Secondary | ICD-10-CM | POA: Diagnosis not present

## 2023-10-23 DIAGNOSIS — Z8541 Personal history of malignant neoplasm of cervix uteri: Secondary | ICD-10-CM | POA: Insufficient documentation

## 2023-10-23 DIAGNOSIS — Z981 Arthrodesis status: Secondary | ICD-10-CM | POA: Diagnosis not present

## 2023-10-23 DIAGNOSIS — Z90710 Acquired absence of both cervix and uterus: Secondary | ICD-10-CM | POA: Diagnosis not present

## 2023-10-23 DIAGNOSIS — M961 Postlaminectomy syndrome, not elsewhere classified: Secondary | ICD-10-CM | POA: Diagnosis not present

## 2023-10-23 DIAGNOSIS — E039 Hypothyroidism, unspecified: Secondary | ICD-10-CM | POA: Diagnosis not present

## 2023-10-23 DIAGNOSIS — Z0189 Encounter for other specified special examinations: Secondary | ICD-10-CM | POA: Diagnosis not present

## 2023-10-23 DIAGNOSIS — K219 Gastro-esophageal reflux disease without esophagitis: Secondary | ICD-10-CM | POA: Insufficient documentation

## 2023-10-23 HISTORY — PX: THORACIC LAMINECTOMY FOR SPINAL CORD STIMULATOR: SHX6887

## 2023-10-23 LAB — URINE CULTURE: Culture: NO GROWTH

## 2023-10-23 LAB — ABO/RH: ABO/RH(D): A NEG

## 2023-10-23 LAB — GLUCOSE, CAPILLARY: Glucose-Capillary: 90 mg/dL (ref 70–99)

## 2023-10-23 SURGERY — THORACIC LAMINECTOMY FOR SPINAL CORD STIMULATOR
Anesthesia: General

## 2023-10-23 MED ORDER — ACETAMINOPHEN 10 MG/ML IV SOLN
INTRAVENOUS | Status: DC | PRN
  Administered 2023-10-23: 1000 mg via INTRAVENOUS

## 2023-10-23 MED ORDER — FENTANYL CITRATE (PF) 100 MCG/2ML IJ SOLN
INTRAMUSCULAR | Status: DC | PRN
  Administered 2023-10-23: 50 ug via INTRAVENOUS

## 2023-10-23 MED ORDER — BUPIVACAINE HCL 0.5 % IJ SOLN: INTRAMUSCULAR | Status: DC | PRN

## 2023-10-23 MED ORDER — SENNA 8.6 MG PO TABS
1.0000 | ORAL_TABLET | Freq: Two times a day (BID) | ORAL | 0 refills | Status: DC | PRN
  Filled 2023-10-23: qty 30, 15d supply, fill #0

## 2023-10-23 MED ORDER — VANCOMYCIN HCL IN DEXTROSE 1-5 GM/200ML-% IV SOLN
INTRAVENOUS | Status: AC
  Filled 2023-10-23: qty 200

## 2023-10-23 MED ORDER — ORAL CARE MOUTH RINSE: 15.0000 mL | Freq: Once | OROMUCOSAL | Status: DC

## 2023-10-23 MED ORDER — LIDOCAINE HCL (CARDIAC) PF 100 MG/5ML IV SOSY
PREFILLED_SYRINGE | INTRAVENOUS | Status: DC | PRN
Start: 2023-10-23 — End: 2023-10-23
  Administered 2023-10-23: 100 mg via INTRATRACHEAL

## 2023-10-23 MED ORDER — PROPOFOL 500 MG/50ML IV EMUL
INTRAVENOUS | Status: DC | PRN
  Administered 2023-10-23: 150 ug/kg/min via INTRAVENOUS

## 2023-10-23 MED ORDER — MIDAZOLAM HCL 2 MG/2ML IJ SOLN
INTRAMUSCULAR | Status: DC | PRN
  Administered 2023-10-23: 2 mg via INTRAVENOUS

## 2023-10-23 MED ORDER — SUCCINYLCHOLINE CHLORIDE 200 MG/10ML IV SOSY
PREFILLED_SYRINGE | INTRAVENOUS | Status: DC | PRN
  Administered 2023-10-23: 100 mg via INTRAVENOUS

## 2023-10-23 MED ORDER — DEXAMETHASONE SODIUM PHOSPHATE 10 MG/ML IJ SOLN
INTRAMUSCULAR | Status: DC | PRN
  Administered 2023-10-23: 10 mg via INTRAVENOUS

## 2023-10-23 MED ORDER — FENTANYL CITRATE (PF) 100 MCG/2ML IJ SOLN
INTRAMUSCULAR | Status: AC
  Filled 2023-10-23: qty 2

## 2023-10-23 MED ORDER — ACETAMINOPHEN 10 MG/ML IV SOLN
INTRAVENOUS | Status: AC
  Filled 2023-10-23: qty 100

## 2023-10-23 MED ORDER — FENTANYL CITRATE (PF) 100 MCG/2ML IJ SOLN
25.0000 ug | INTRAMUSCULAR | Status: DC | PRN
  Administered 2023-10-23 (×3): 25 ug via INTRAVENOUS

## 2023-10-23 MED ORDER — OXYCODONE HCL 5 MG/5ML PO SOLN: 5.0000 mg | Freq: Once | ORAL | Status: AC | PRN

## 2023-10-23 MED ORDER — BUPIVACAINE-EPINEPHRINE (PF) 0.5% -1:200000 IJ SOLN
INTRAMUSCULAR | Status: DC | PRN
  Administered 2023-10-23: 10 mL

## 2023-10-23 MED ORDER — CHLORHEXIDINE GLUCONATE 0.12 % MT SOLN
OROMUCOSAL | Status: AC
  Filled 2023-10-23: qty 15

## 2023-10-23 MED ORDER — PROPOFOL 10 MG/ML IV BOLUS
INTRAVENOUS | Status: DC | PRN
  Administered 2023-10-23: 150 mg via INTRAVENOUS
  Administered 2023-10-23: 50 mg via INTRAVENOUS
  Administered 2023-10-23: 30 mg via INTRAVENOUS
  Administered 2023-10-23: 20 mg via INTRAVENOUS

## 2023-10-23 MED ORDER — OXYCODONE HCL 5 MG PO TABS
5.0000 mg | ORAL_TABLET | Freq: Once | ORAL | Status: AC | PRN
  Administered 2023-10-23: 5 mg via ORAL

## 2023-10-23 MED ORDER — ONDANSETRON HCL 4 MG/2ML IJ SOLN
4.0000 mg | Freq: Once | INTRAMUSCULAR | Status: AC
  Administered 2023-10-23: 4 mg via INTRAVENOUS

## 2023-10-23 MED ORDER — KETAMINE HCL 50 MG/5ML IJ SOSY
PREFILLED_SYRINGE | INTRAMUSCULAR | Status: DC | PRN
  Administered 2023-10-23: 30 mg via INTRAVENOUS

## 2023-10-23 MED ORDER — PROPOFOL 1000 MG/100ML IV EMUL
INTRAVENOUS | Status: AC
  Filled 2023-10-23: qty 100

## 2023-10-23 MED ORDER — SURGIFLO WITH THROMBIN (HEMOSTATIC MATRIX KIT) OPTIME
TOPICAL | Status: DC | PRN
  Administered 2023-10-23: 1 via TOPICAL

## 2023-10-23 MED ORDER — SUCCINYLCHOLINE CHLORIDE 200 MG/10ML IV SOSY
PREFILLED_SYRINGE | INTRAVENOUS | Status: AC
  Filled 2023-10-23: qty 10

## 2023-10-23 MED ORDER — PROPOFOL 10 MG/ML IV BOLUS
INTRAVENOUS | Status: AC
  Filled 2023-10-23: qty 20

## 2023-10-23 MED ORDER — REMIFENTANIL HCL 1 MG IV SOLR
INTRAVENOUS | Status: AC
  Filled 2023-10-23: qty 1000

## 2023-10-23 MED ORDER — ONDANSETRON HCL 4 MG/2ML IJ SOLN
INTRAMUSCULAR | Status: AC
  Filled 2023-10-23: qty 2

## 2023-10-23 MED ORDER — LIDOCAINE HCL (PF) 2 % IJ SOLN
INTRAMUSCULAR | Status: AC
Start: 2023-10-23 — End: ?
  Filled 2023-10-23: qty 5

## 2023-10-23 MED ORDER — REMIFENTANIL HCL 1 MG IV SOLR
INTRAVENOUS | Status: DC | PRN
  Administered 2023-10-23: .1 ug/kg/min via INTRAVENOUS

## 2023-10-23 MED ORDER — OXYCODONE HCL 5 MG PO TABS
5.0000 mg | ORAL_TABLET | ORAL | 0 refills | Status: DC | PRN
  Filled 2023-10-23: qty 30, 5d supply, fill #0

## 2023-10-23 MED ORDER — METHOCARBAMOL 750 MG PO TABS
750.0000 mg | ORAL_TABLET | Freq: Four times a day (QID) | ORAL | 0 refills | Status: DC | PRN
Start: 2023-10-23 — End: 2023-10-26
  Filled 2023-10-23: qty 120, 30d supply, fill #0

## 2023-10-23 MED ORDER — ONDANSETRON HCL 4 MG/2ML IJ SOLN
INTRAMUSCULAR | Status: AC
Start: 2023-10-23 — End: ?
  Filled 2023-10-23: qty 2

## 2023-10-23 MED ORDER — DEXAMETHASONE SODIUM PHOSPHATE 10 MG/ML IJ SOLN
INTRAMUSCULAR | Status: AC
  Filled 2023-10-23: qty 1

## 2023-10-23 MED ORDER — ONDANSETRON HCL 4 MG/2ML IJ SOLN
INTRAMUSCULAR | Status: DC | PRN
  Administered 2023-10-23 (×2): 4 mg via INTRAVENOUS

## 2023-10-23 MED ORDER — CEFAZOLIN SODIUM-DEXTROSE 2-4 GM/100ML-% IV SOLN
INTRAVENOUS | Status: AC
  Filled 2023-10-23: qty 100

## 2023-10-23 MED ORDER — OXYCODONE HCL 5 MG PO TABS
ORAL_TABLET | ORAL | Status: AC
  Filled 2023-10-23: qty 1

## 2023-10-23 MED ORDER — SODIUM CHLORIDE (PF) 0.9 % IJ SOLN
INTRAMUSCULAR | Status: AC
  Filled 2023-10-23: qty 20

## 2023-10-23 MED ORDER — KETOROLAC TROMETHAMINE 30 MG/ML IJ SOLN
INTRAMUSCULAR | Status: DC | PRN
Start: 2023-10-23 — End: 2023-10-23
  Administered 2023-10-23: 30 mg via INTRAVENOUS

## 2023-10-23 MED ORDER — KETOROLAC TROMETHAMINE 30 MG/ML IJ SOLN
INTRAMUSCULAR | Status: AC
  Filled 2023-10-23: qty 1

## 2023-10-23 MED ORDER — SURGIRINSE WOUND IRRIGATION SYSTEM - OPTIME
TOPICAL | Status: DC | PRN
  Administered 2023-10-23: 450 mL via TOPICAL

## 2023-10-23 MED ORDER — MIDAZOLAM HCL 2 MG/2ML IJ SOLN
INTRAMUSCULAR | Status: AC
  Filled 2023-10-23: qty 2

## 2023-10-23 MED ORDER — KETAMINE HCL 50 MG/5ML IJ SOSY
PREFILLED_SYRINGE | INTRAMUSCULAR | Status: AC
  Filled 2023-10-23: qty 5

## 2023-10-23 MED ORDER — SODIUM CHLORIDE (PF) 0.9 % IJ SOLN
INTRAMUSCULAR | Status: DC | PRN
  Administered 2023-10-23: 60 mL

## 2023-10-23 MED ORDER — 0.9 % SODIUM CHLORIDE (POUR BTL) OPTIME
TOPICAL | Status: DC | PRN
  Administered 2023-10-23 (×2): 500 mL

## 2023-10-23 SURGICAL SUPPLY — 38 items
BELT PT INTERSTIM MICRO SYSTEM (MISCELLANEOUS) IMPLANT
BUR NEURO DRILL SOFT 3.0X3.8M (BURR) ×1 IMPLANT
CONTROLLER HANDSET COMM KIT (NEUROSURGERY SUPPLIES) IMPLANT
DERMABOND ADVANCED .7 DNX12 (GAUZE/BANDAGES/DRESSINGS) ×2 IMPLANT
DRAPE C ARM PK CFD 31 SPINE (DRAPES) ×1 IMPLANT
DRAPE LAPAROTOMY 100X77 ABD (DRAPES) ×1 IMPLANT
DRSG OPSITE POSTOP 4X6 (GAUZE/BANDAGES/DRESSINGS) IMPLANT
DRSG OPSITE POSTOP 4X8 (GAUZE/BANDAGES/DRESSINGS) IMPLANT
ELECTRODE REM PT RTRN 9FT ADLT (ELECTROSURGICAL) ×1 IMPLANT
FEE INTRAOP CADWELL SUPPLY NCS (MISCELLANEOUS) ×1 IMPLANT
FEE INTRAOP MONITOR IMPULS NCS (MISCELLANEOUS) ×1 IMPLANT
GLOVE BIOGEL PI IND STRL 6.5 (GLOVE) ×1 IMPLANT
GLOVE SURG SYN 6.5 ES PF (GLOVE) ×4 IMPLANT
GLOVE SURG SYN 6.5 PF PI (GLOVE) ×1 IMPLANT
GLOVE SURG SYN 8.5 E (GLOVE) ×3 IMPLANT
GLOVE SURG SYN 8.5 PF PI (GLOVE) ×3 IMPLANT
GOWN SRG LRG LVL 4 IMPRV REINF (GOWNS) ×1 IMPLANT
GOWN SRG XL LVL 3 NONREINFORCE (GOWNS) ×1 IMPLANT
KIT SPINAL PRONEVIEW (KITS) ×1 IMPLANT
LAVAGE JET IRRISEPT WOUND (IRRIGATION / IRRIGATOR) ×1 IMPLANT
MANIFOLD NEPTUNE II (INSTRUMENTS) ×1 IMPLANT
MARKER SKIN DUAL TIP RULER LAB (MISCELLANEOUS) ×1 IMPLANT
NDL SAFETY ECLIPSE 18X1.5 (NEEDLE) ×1 IMPLANT
NEUROSTIM INCEPTIV (Neuro Prosthesis/Implant) IMPLANT
NS IRRIG 500ML POUR BTL (IV SOLUTION) ×1 IMPLANT
PACK LAMINECTOMY ARMC (PACKS) ×1 IMPLANT
PROGRAMMER AND COMM CASE (NEUROSURGERY SUPPLIES) IMPLANT
RECHARGER SYSTEM (NEUROSURGERY SUPPLIES) IMPLANT
STAPLER SKIN PROX 35W (STAPLE) ×1 IMPLANT
STIMULATOR CORD SURESCAN MRI (Stimulator) IMPLANT
SURGIFLO W/THROMBIN 8M KIT (HEMOSTASIS) ×1 IMPLANT
SUT SILK 2 0SH CR/8 30 (SUTURE) ×1 IMPLANT
SUT STRATA 3-0 15 PS-2 (SUTURE) ×2 IMPLANT
SUT VIC AB 0 CT1 18XCR BRD 8 (SUTURE) ×1 IMPLANT
SUT VIC AB 2-0 CT1 18 (SUTURE) ×1 IMPLANT
SYR 10ML LL (SYRINGE) IMPLANT
SYR 30ML LL (SYRINGE) ×2 IMPLANT
TRAP FLUID SMOKE EVACUATOR (MISCELLANEOUS) ×1 IMPLANT

## 2023-10-23 NOTE — Anesthesia Procedure Notes (Signed)
 Procedure Name: Intubation Date/Time: 10/23/2023 11:37 AM  Performed by: Angelia Kelp, CRNAPre-anesthesia Checklist: Patient identified, Emergency Drugs available, Suction available and Patient being monitored Patient Re-evaluated:Patient Re-evaluated prior to induction Oxygen Delivery Method: Circle system utilized Preoxygenation: Pre-oxygenation with 100% oxygen Induction Type: IV induction Ventilation: Mask ventilation without difficulty Laryngoscope Size: McGrath and 3 Grade View: Grade I Tube type: Oral Tube size: 7.0 mm Number of attempts: 1 Airway Equipment and Method: Stylet and Oral airway Placement Confirmation: ETT inserted through vocal cords under direct vision, positive ETCO2 and breath sounds checked- equal and bilateral Secured at: 20 cm Tube secured with: Tape Dental Injury: Teeth and Oropharynx as per pre-operative assessment

## 2023-10-23 NOTE — Interval H&P Note (Signed)
 History and Physical Interval Note:  10/23/2023 10:53 AM  Peggy Ruiz  has presented today for surgery, with the diagnosis of G89.4  Chronic pain syndrome M96.1 Postlaminectomy syndrome, lumbar region.  The various methods of treatment have been discussed with the patient and family. After consideration of risks, benefits and other options for treatment, the patient has consented to  Procedure(s) with comments: THORACIC LAMINECTOMY FOR SPINAL CORD STIMULATOR (N/A) - THORACIC LAMINECTOMY FOR SPINAL CORD STIMULATOR as a surgical intervention.  The patient's history has been reviewed, patient examined, no change in status, stable for surgery.  I have reviewed the patient's chart and labs.  Questions were answered to the patient's satisfaction.    Heart sounds normal no MRG. Chest Clear to Auscultation Bilaterally.   Maelee Hoot

## 2023-10-23 NOTE — Telephone Encounter (Signed)
  Media Information    FYI, surgery today for SCS

## 2023-10-23 NOTE — Transfer of Care (Signed)
 Immediate Anesthesia Transfer of Care Note  Patient: Kerri Peed  Procedure(s) Performed: THORACIC LAMINECTOMY FOR SPINAL CORD STIMULATOR  Patient Location: PACU  Anesthesia Type:General  Level of Consciousness: awake, drowsy, and patient cooperative  Airway & Oxygen Therapy: Patient Spontanous Breathing  Post-op Assessment: Report given to RN and Post -op Vital signs reviewed and stable  Post vital signs: Reviewed and stable  Last Vitals:  Vitals Value Taken Time  BP 123/84 10/23/23 1332  Temp    Pulse 90 10/23/23 1332  Resp 12 10/23/23 1332  SpO2 100 % 10/23/23 1332    Last Pain:  Vitals:   10/23/23 0848  PainSc: 8          Complications: No notable events documented.

## 2023-10-23 NOTE — Discharge Instructions (Addendum)
 NEUROSURGERY DISCHARGE INSTRUCTIONS  Admission diagnosis: Postlaminectomy syndrome, lumbar region [M96.1] Chronic pain syndrome [G89.4]  Operative procedure: Spinal Cord Stimulator placement  What to do after you leave the hospital:  Recommended diet: regular diet. Increase protein intake to promote wound healing.  Recommended activity: no lifting, driving, or strenuous exercise for 4 weeks . You should walk multiple times per day  Special Instructions  No straining, no heavy lifting > 10lbs x 4 weeks.  Keep incision area clean and dry. May shower in 2 days. No baths or pools for 6 weeks.  Please remove dressing tomorrow, no need to apply a bandage afterwards  You have sutures or staples that will be removed in clinic.   Please take pain medications as directed. Take a stool softener if on pain medications  *Regarding compression stockings-  Please wear day and night until you are walking a couple hundred feet three times a day.   Please Report any of the following: Nausea or Vomiting, Temperature is greater than 101.96F (38.1C) degrees, Dizziness, Abdominal Pain, Difficulty Breathing or Shortness of Breath, Inability to Eat, drink Fluids, or Take medications, Bleeding, swelling, or drainage from surgical incision sites, New numbness or weakness, and Bowel or bladder dysfunction to the neurosurgeon on call. How to contact us :  If you have any questions/concerns before or after surgery, you can reach us  at [phone], or you can send a mychart message. We can be reached by phone or mychart 8am-4pm, Monday-Friday.  *Please note: Calls after 4pm are forwarded to a third party answering service. Mychart messages are not routinely monitored during evenings, weekends, and holidays. Please call our office to contact the answering service for urgent concerns during non-business hours.   Additional Follow up appointments Please follow up with Lucetta Russel PA-C as scheduled in 2-3 weeks   Please  see below for scheduled appointments:  Future Appointments  Date Time Provider Department Center  11/06/2023 10:30 AM Lucetta Russel, PA-C [facility] None  11/09/2023  2:00 PM Marlise Simpers, [facility] None  11/30/2023  2:00 PM Marlise Simpers, [facility] CP[facility]CP None  12/05/2023 11:45 AM Jodeen Munch, MD [facility] None  01/04/2024  1:00 PM Marlise Simpers, [facility] CP-CP None  01/24/2024  1:15 PM Floyce Hutching, DPM [facility]  02/01/2024  1:00 PM Marlise Simpers, [facility] CP-CP None  02/16/2024 11:00 AM Rockney Cid, DO [facility]  02/29/2024  1:00 PM Marlise Simpers, [facility] CP-CP None  03/28/2024  1:00 PM Marlise Simpers, [facility] CP-CP None

## 2023-10-23 NOTE — Discharge Summary (Signed)
 Discharge Summary  Patient ID: Peggy Ruiz MRN: [id number] DOB/AGE: [birth year] 38 y.o.  Admit date: 10/23/2023 Discharge date: 10/23/2023  Admission Diagnoses: G89.4 Chronic pain syndrome, M96.1 Postlaminectomy syndrome, lumbar region   Discharge Diagnoses:  Active Problems:   Chronic pain syndrome   Postlaminectomy syndrome, lumbar region   Discharged Condition: good  Hospital Course:  Peggy Ruiz is a 38 y.o presenting with chronic pain s/p SCS placement. Her intraoperative course was uncomplicated. She was monitored in PACU and discharged home after ambulating, urinating, and tolerating PO intake. She was given prescriptions for Robaxin, Senna and Oxycodone to take as needed.   Consults: None  Significant Diagnostic Studies: NA  Treatments: surgery: as above. Please see separately dictated operative report for further details.   Discharge Exam: Blood pressure 111/86, pulse 75, temperature 97.8 F (36.6 C), resp. rate 12, height 5\' 8"  (1.727 m), weight 59 kg, last menstrual period 03/02/2017, SpO2 100%. CN grossly intact MAEW Incisions c/d/I with dressings in place  Disposition: Discharge disposition: 01-Home or Self Care        Allergies as of 10/23/2023       Reactions   Benzyl Alcohol Hives, Other (See Comments)   Other reaction(s): Other, frailing , frailing   Benzoin Hives, Other (See Comments)   Chlorhexidine Rash   Large rash   Promethazine    Other reaction(s): Agitation   Scopolamine Rash   Ciprofloxacin Other (See Comments)   Severe c-diff x2   Cyanoacrylate Itching   Derma-bond( surgical glue)   Dilaudid [hydromorphone]    sob   Epinephrine    Penicillins Hives   Has tolerated 1st generation cephalosporin (CEPHALEXIN ) on multiple occasions in the past without documented ADRs.   Toradol  [ketorolac  Tromethamine ] Nausea And Vomiting   Bactrim [sulfamethoxazole-trimethoprim] Rash        Medication List     TAKE these medications     buPROPion  150 MG 24 hr tablet Commonly known as: Wellbutrin  XL Take 1 tablet (150 mg total) by mouth daily. What changed: when to take this   cephALEXin  500 MG capsule Commonly known as: KEFLEX  Take 1 capsule (500 mg total) by mouth 4 (four) times daily.   cholecalciferol 25 MCG (1000 UNIT) tablet Commonly known as: VITAMIN D3 Take 1,000 Units by mouth daily.   Claritin 10 MG tablet Generic drug: loratadine Take 10 mg by mouth daily as needed for allergies.   clonazePAM  0.5 MG tablet Commonly known as: KLONOPIN  Take 1 tablet (0.5 mg total) by mouth 2 (two) times daily AND 2 tablets (1 mg total) at bedtime. What changed: See the new instructions.   cloNIDine  0.1 MG tablet Commonly known as: CATAPRES  Take 0.1 mg by mouth 2 (two) times daily as needed (anxiety).   docusate sodium 100 MG capsule Commonly known as: COLACE Take 100-300 mg by mouth at bedtime as needed for mild constipation.   gabapentin 800 MG tablet Commonly known as: NEURONTIN Take 800 mg by mouth 2 (two) times daily.   itraconazole  100 MG capsule Commonly known as: Sporanox  Take 2 capsules (200 mg total) by mouth daily. What changed: when to take this   lansoprazole 30 MG capsule Commonly known as: PREVACID Take 30 mg by mouth 2 (two) times daily before a meal.   levothyroxine  50 MCG tablet Commonly known as: SYNTHROID  Take 1 tablet (50 mcg total) by mouth daily. What changed: when to take this   Magnesium Oxide (Laxative) 500 MG Tabs Take 1,500 mg by mouth at bedtime.  methocarbamol 750 MG tablet Commonly known as: ROBAXIN Take 1 tablet (750 mg total) by mouth every 6 (six) hours as needed for muscle spasms. What changed: when to take this   multivitamin tablet Take 1 tablet by mouth daily.   nortriptyline  10 MG capsule Commonly known as: PAMELOR  Take 1 capsule (10 mg total) by mouth at bedtime.   Nucynta 75 MG tablet Generic drug: tapentadol HCl Take 75 mg by mouth every 6 (six)  hours as needed for moderate pain (pain score 4-6).   ondansetron  8 MG disintegrating tablet Commonly known as: ZOFRAN -ODT Take 8 mg by mouth every 8 (eight) hours as needed for vomiting or nausea.   oxyCODONE 5 MG immediate release tablet Commonly known as: Roxicodone Take 1 tablet (5 mg total) by mouth every 4 (four) hours as needed for severe pain (pain score 7-10).   Restasis 0.05 % ophthalmic emulsion Generic drug: cycloSPORINE Place 1 drop into both eyes 2 (two) times daily.   Rexulti  1 MG Tabs tablet Generic drug: brexpiprazole  Take 1 mg by mouth every morning.   senna 8.6 MG Tabs tablet Commonly known as: SENOKOT Take 1 tablet (8.6 mg total) by mouth 2 (two) times daily as needed.   ursodiol 300 MG capsule Commonly known as: ACTIGALL Take 300 mg by mouth 2 (two) times daily.         Signed: Noble Bateman 10/23/2023, 1:55 PM

## 2023-10-23 NOTE — Anesthesia Preprocedure Evaluation (Signed)
 Anesthesia Evaluation  Patient identified by MRN, date of birth, ID band Patient awake    Reviewed: Allergy & Precautions, NPO status , Patient's Chart, lab work & pertinent test results  History of Anesthesia Complications (+) PONV, PROLONGED EMERGENCE and history of anesthetic complications  Airway Mallampati: II  TM Distance: >3 FB Neck ROM: full    Dental  (+) Chipped   Pulmonary neg pulmonary ROS, neg shortness of breath   Pulmonary exam normal        Cardiovascular (-) angina (-) Past MI negative cardio ROS Normal cardiovascular exam     Neuro/Psych  PSYCHIATRIC DISORDERS      negative neurological ROS     GI/Hepatic Neg liver ROS,GERD  Controlled,,  Endo/Other  Hypothyroidism    Renal/GU      Musculoskeletal   Abdominal   Peds  Hematology negative hematology ROS (+)   Anesthesia Other Findings Past Medical History: No date: Adenocarcinoma of cervix (HCC) No date: Anemia No date: Anorexia     Comment:  at 15 No date: Anxiety No date: Attention deficit disorder (ADD) No date: Bone spur No date: Bulging lumbar disc No date: Chronic pain syndrome No date: Chronically dry eyes, bilateral No date: Complication of anesthesia     Comment:  delayed emergence No date: Depression No date: Eczema No date: Endometriosis No date: Gastroparesis No date: GERD (gastroesophageal reflux disease) No date: HPV (human papilloma virus) infection No date: Hypothyroidism No date: Lyme disease No date: Mild mood disorder (HCC) No date: Nummular eczema No date: PONV (postoperative nausea and vomiting) No date: Pre-diabetes No date: PTSD (post-traumatic stress disorder) No date: Spinal stenosis of lumbar region No date: Thyroid disease  Past Surgical History: No date: ABDOMINAL HYSTERECTOMY No date: BACK SURGERY     Comment:  3 different surgeries-lumbar No date: cervical cone ciopsy No date: CHOLECYSTECTOMY No  date: DIAGNOSTIC LAPAROSCOPY     Comment:  for endometriosis No date: EYE SURGERY     Comment:  for lazy eye No date: HERNIA REPAIR  BMI    Body Mass Index: 19.77 kg/m      Reproductive/Obstetrics negative OB ROS                             Anesthesia Physical Anesthesia Plan  ASA: 2  Anesthesia Plan: General ETT   Post-op Pain Management:    Induction: Intravenous  PONV Risk Score and Plan: Ondansetron , Dexamethasone, Midazolam  and Treatment may vary due to age or medical condition  Airway Management Planned: Oral ETT  Additional Equipment:   Intra-op Plan:   Post-operative Plan: Extubation in OR  Informed Consent: I have reviewed the patients History and Physical, chart, labs and discussed the procedure including the risks, benefits and alternatives for the proposed anesthesia with the patient or authorized representative who has indicated his/her understanding and acceptance.     Dental Advisory Given  Plan Discussed with: Anesthesiologist, CRNA and Surgeon  Anesthesia Plan Comments: (Patient consented for risks of anesthesia including but not limited to:  - adverse reactions to medications - damage to eyes, teeth, lips or other oral mucosa - nerve damage due to positioning  - sore throat or hoarseness - Damage to heart, brain, nerves, lungs, other parts of body or loss of life  Patient voiced understanding and assent.)       Anesthesia Quick Evaluation

## 2023-10-23 NOTE — Telephone Encounter (Signed)
 Patient placed on Antibiotics as a  prophylactic. Patient should let facility know upon arrival for surgery.

## 2023-10-23 NOTE — Op Note (Signed)
 Indications: the patient is a 38 yo female who was diagnosed with G89.4 Chronic pain syndrome, M96.1 Postlaminectomy syndrome, lumbar region . The patient had a successful trial for spinal cord stimulation, so was consented for placement of a permanent device   Findings: successful placement of a Medtronic spinal cord stimulator.    Preoperative Diagnosis: G89.4 Chronic pain syndrome, M96.1 Postlaminectomy syndrome, lumbar region  Postoperative Diagnosis: same     EBL: 50 ml IVF: see anesthesia record Drains: none Disposition: Extubated and Stable to PACU Complications: none   No foley catheter was placed.     Preoperative Note:    Risks of surgery discussed in clinic.   Operative Note:      The patient was then brought from the preoperative center with intravenous access established.  The patient underwent general anesthesia and endotracheal tube intubation, then was rotated on the [facility] table where all pressure points were appropriately padded.  An incision was marked with flouroscopy at T9, and on the left flank. The skin was then thoroughly cleansed.  Perioperative antibiotic prophylaxis was administered.  Sterile prep and drapes were then applied and a timeout was then observed.     Once this was complete an incision was opened with the use of a #10 blade knife in the midline at the thoracic incision.  The paraspinus muscled were subperiosteally dissected until the laminae of T9 and T10 were visualized. Flouroscopy was used to confirm the level. A self-retaining retractor was placed.   The rongeur was used to remove the spinous process of T9.  The drill was used to thin the bone until the ligamentum flavum was visualized.  The ligamentum was then removed and the dura visualized. This was widened until placement of the paddle lead was possible.     The lead was then advanced to the T8 superior endplate at the top of the lead.  The lead was secured with a 2-0 silk suture.    The  incision on the flank was then opened and a pocket formed until it was large enough for the pulse generator.  The tunneler was used to connect between the pocket and the incision.  The lead was inserted into the tunneler and tunneled to the flank.  The leads were attached to the IPG and impedances checked.  The leads were then tightened.  The IPG was then inserted into the pouch.   Both sites were irrigated.  The wounds were closed in layers with 0 and 2-0 vicryl.  The skin was approximated with staples. A sterile dressing was applied.   Monitoring was stable throughout.   Patient was then rotated back to the preoperative bed awakened from anesthesia and taken to recovery. All counts are correct in this case.   I performed the entire procedure with the assistance of Anastacio Karvonen PA as an Designer, television/film set. An assistant was required for this procedure due to the complexity.  The assistant provided assistance in tissue manipulation and suction, and was required for the successful and safe performance of the procedure. I performed the critical portions of the procedure.      Jodeen Munch MD

## 2023-10-23 NOTE — Anesthesia Postprocedure Evaluation (Signed)
 Anesthesia Post Note  Patient: Peggy Ruiz  Procedure(s) Performed: THORACIC LAMINECTOMY FOR SPINAL CORD STIMULATOR  Patient location during evaluation: PACU Anesthesia Type: General Level of consciousness: awake and alert Pain management: pain level controlled Vital Signs Assessment: post-procedure vital signs reviewed and stable Respiratory status: spontaneous breathing, nonlabored ventilation, respiratory function stable and patient connected to nasal cannula oxygen Cardiovascular status: blood pressure returned to baseline and stable Postop Assessment: no apparent nausea or vomiting Anesthetic complications: no   No notable events documented.   Last Vitals:  Vitals:   10/23/23 1415 10/23/23 1438  BP: (!) 126/94 116/81  Pulse: 87 77  Resp: 10 16  Temp: (!) 36.4 C (!) 36.3 C  SpO2: 100% 100%    Last Pain:  Vitals:   10/23/23 1438  TempSrc: Temporal  PainSc: 3                  Portia Brittle Liyah Higham

## 2023-10-24 ENCOUNTER — Encounter: Payer: Self-pay | Admitting: Neurosurgery

## 2023-10-26 ENCOUNTER — Other Ambulatory Visit: Payer: Self-pay | Admitting: Neurosurgery

## 2023-10-26 MED ORDER — CYCLOBENZAPRINE HCL 10 MG PO TABS: 10.0000 mg | ORAL_TABLET | Freq: Three times a day (TID) | ORAL | 0 refills | Status: AC | PRN

## 2023-10-30 ENCOUNTER — Encounter: Payer: Self-pay | Admitting: Neurosurgery

## 2023-10-31 ENCOUNTER — Ambulatory Visit

## 2023-10-31 VITALS — Temp 98.6°F

## 2023-10-31 DIAGNOSIS — F32A Depression, unspecified: Secondary | ICD-10-CM | POA: Diagnosis not present

## 2023-10-31 DIAGNOSIS — Z9889 Other specified postprocedural states: Secondary | ICD-10-CM

## 2023-10-31 DIAGNOSIS — F411 Generalized anxiety disorder: Secondary | ICD-10-CM | POA: Diagnosis not present

## 2023-10-31 DIAGNOSIS — F4310 Post-traumatic stress disorder, unspecified: Secondary | ICD-10-CM | POA: Diagnosis not present

## 2023-10-31 MED ORDER — METHYLPREDNISOLONE 4 MG PO TBPK: ORAL_TABLET | ORAL | 0 refills | Status: DC

## 2023-10-31 NOTE — Addendum Note (Signed)
 Addended by: Santita Hunsberger on: 10/31/2023 02:20 PM   Modules accepted: Orders

## 2023-10-31 NOTE — Progress Notes (Signed)
 Patient returns to office post op Thoracic Laminectomy for Spinal Cord Stimulator on 10/23/2023.   Patient contacted the office this morning with complaints that one of her staples  were displaced at the  incision. She states that this is giving her difficulty with sleep and comfort at home. I discussed with Danielle PA-C who said that it was okay to remove the singular staple and replace with a steri-strip.   Her surgical incisions appear well healing. There is no drainage, no erythremia, or unexpected swelling around the thoracic or lumbar incisions. The displaced stable was removed after prepping the site with alcohol. Chorahexidine was avoided in this situation as the patient is allergic.   She has an ongoing rash that developed approximately 5 days after surgery. Choraprep was avoided as she explained that she has a systemic skin reaction to CHG. She discussed with Danielle PA-C and it was decided that the patient would use a steroid cream that she has previously used and then discuss a steriod dose pack after a few days    She decided that she would like to try the Medrol Dose Pack as previously discussed with Danielle PA-C. She has taken this medication before and listed the common side effects of insomnia, mood changes, and stomach upset. Patient will contact us  as needed if her rash does not improve. I sent this medication in with cosign from Danielle PA-C.    Patient will return to office on 11/06/2023 for 2 week post op appointment where she will have further assessment of surgical site and potential stable removal. Patient is aware to contact the office with any concerns in the meantime.   Barbra Ley Caylea Foronda RN-BSN [facility]  Neurosurgery at [facility]

## 2023-10-31 NOTE — Telephone Encounter (Signed)
 I called the patient, she will see either Peggy Ruiz or I at 2:00pm

## 2023-11-03 NOTE — Progress Notes (Unsigned)
   REFERRING PHYSICIAN:  Chanetta Comes [address] [place],  [place] [address]  DOS: 10/23/23 placement of Medtronic SCS  HISTORY OF PRESENT ILLNESS: CHERIA WHOBREY is approximately 2 weels status post above surgery.   Was seen in clinic last week to have a staple removed. Rash was noted after surgery. She was using cream and medrol  dose pack was called in on 10/31/23.   She was restarted on her nucynta after surgery and was changed to flexeril . She was to use oxycodone  prn.   Her rash has improved. She continues with her chronic back and right leg pain.   She is taking nucynta twice a day and oxycodone  1-2 times a day.   PHYSICAL EXAMINATION:  General: Patient is well developed, well nourished, calm, collected, and in no apparent distress.   NEUROLOGICAL:  General: In no acute distress.   Awake, alert, oriented to person, place, and time.  Pupils equal round and reactive to light.  Facial tone is symmetric.     Strength:          Side Iliopsoas Quads Hamstring PF DF EHL  R 5 5 5 5 5 5   L 5 5 5 5 5 5    Incisions c/d/I, she has some irritation from the staples, but no other erythema noted. No signs of infection. No rash.    ROS (Neurologic):  Negative except as noted above  IMAGING: Nothing new to review.   ASSESSMENT/PLAN:  DELYNN KULKA is doing well s/p above surgery. Treatment options reviewed with patient and following plan made:   - I have advised the patient to lift up to 10 pounds until 6 weeks after surgery (follow up with Dr. Mont Antis).  - Reviewed wound care.  - No bending, twisting, or lifting.  - Continue on current medications including her chronic nycenta and prn oxycodone .  - Of note, prior to staple removal her incision was cleaned with chloraprep (she is allergic to this) and she had some initial burning. Incision was then quickly cleaned with alcohol. She has no current symptoms. No rash is noted. She will watch this closely. She  can take OTC benadryl as directed on box. She can also use steroid cream, but not over incision. Will call if any issues can consider po atarax or repeat steroid dose pack.  I will message her tomorrow to check on her.  - Follow up as scheduled in 4 weeks and prn.   Advised to contact the office if any questions or concerns arise.  Lucetta Russel PA-C Department of neurosurgery

## 2023-11-06 ENCOUNTER — Encounter: Payer: Self-pay | Admitting: Orthopedic Surgery

## 2023-11-06 ENCOUNTER — Ambulatory Visit (INDEPENDENT_AMBULATORY_CARE_PROVIDER_SITE_OTHER): Admitting: Orthopedic Surgery

## 2023-11-06 VITALS — BP 102/70 | Temp 98.2°F | Ht 68.0 in | Wt 130.0 lb

## 2023-11-06 DIAGNOSIS — Z9689 Presence of other specified functional implants: Secondary | ICD-10-CM

## 2023-11-06 DIAGNOSIS — G894 Chronic pain syndrome: Secondary | ICD-10-CM

## 2023-11-07 DIAGNOSIS — Z9689 Presence of other specified functional implants: Secondary | ICD-10-CM

## 2023-11-07 DIAGNOSIS — G894 Chronic pain syndrome: Secondary | ICD-10-CM

## 2023-11-07 MED ORDER — HYDROXYZINE HCL 10 MG PO TABS: 10.0000 mg | ORAL_TABLET | Freq: Three times a day (TID) | ORAL | 0 refills | Status: DC | PRN

## 2023-11-07 NOTE — Telephone Encounter (Signed)
 Reviewed with Dr. Mont Antis. Try hydroxyzine first, but if no improvement can do gentle lotion.

## 2023-11-09 ENCOUNTER — Ambulatory Visit: Payer: BC Managed Care – PPO | Admitting: Psychiatry

## 2023-11-09 DIAGNOSIS — F411 Generalized anxiety disorder: Secondary | ICD-10-CM | POA: Diagnosis not present

## 2023-11-09 NOTE — Progress Notes (Unsigned)
 [facility] Counselor/Therapist Progress Note  Patient ID: Peggy Ruiz, MRN: [id number],    Date: 11/09/2023  Time Spent: 61 minutes start time 2:02 PM end time 3:03 PM Virtual Visit via Video Note Connected with patient by a telemedicine/telehealth application, with their informed consent, and verified patient privacy and that I am speaking with the correct person using two identifiers. I discussed the limitations, risks, security and privacy concerns of performing psychotherapy and the availability of in person appointments. I also discussed with the patient that there may be a patient responsible charge related to this service. The patient expressed understanding and agreed to proceed. I discussed the treatment planning with the patient. The patient was provided an opportunity to ask questions and all were answered. The patient agreed with the plan and demonstrated an understanding of the instructions. The patient was advised to call  our office if  symptoms worsen or feel they are in a crisis state and need immediate contact.   Therapist Location: office Patient Location: home    Treatment Type: Individual Therapy  Reported Symptoms: anxiety, triggered responses, sadness, rumination, pain issues, irritability, crying spells, panic  Mental Status Exam:  Appearance:   Well Groomed     Behavior:  Appropriate  Motor:  Normal  Speech/Language:   Normal Rate  Affect:  Appropriate  Mood:  anxious  Thought process:  normal  Thought content:    WNL  Sensory/Perceptual disturbances:    WNL  Orientation:  oriented to person, place, time/date, and situation  Attention:  Good  Concentration:  Good  Memory:  WNL  Fund of knowledge:   Good  Insight:    Good  Judgment:   Good  Impulse Control:  Good   Risk Assessment: Danger to Self:  No Self-injurious Behavior: No Danger to Others: No Duty to Warn:no Physical Aggression / Violence:No  Access to Firearms a concern: No  Gang  Involvement:No   Subjective: Met with patient via virtual session. She shared that she got through her surgery but it has been hard. She shared she has multiple limitations and will for a few more weeks. She explained that her husband is having to go back to work and will have overtime and she doesn't feel she can be left alone that much during the day. Her parents are going to move in with them for the rest of the time she will be on restrictions. She shared she is wanting to come off of her pain medication but has to wait until she gets to see them in the office. She shared she has been feeling anxious about a number of things. The fear of the stimulator not working was overwhelming to her, now that she knows what it is like to have a decrease in pain it is hard to think of not having it and going back to all the pain. Good things never stay. SUDS level 10, negative cognition "I don't deserve anything good" felt anxiety, feat in her stomach and chest.  Patient was able to reduce suds level to 5.  Through the processing she was able to recognize that her fear is not just about the stimulator it is also about the potential of losing her recent friend that she is made and is getting very close to.  Patient was encouraged to think through other relationships in her past and to see that typically people do not have the same symptoms physically as she does and that has made a challenge  in the relationships.  Currently the friend she has had similar health issues and so they can understand each other and support each other more appropriately.  Patient was challenged just to be thankful and have joy over the situation rather than ruminating on the what if concerning the possibility of losing the friendships.  Patient agreed to try and refocus her thoughts onto things she is thankful for when she catches herself ruminating on the negative.  Interventions: Eye Movement Desensitization and Reprocessing (EMDR) and  Insight-Oriented  Diagnosis:   ICD-10-CM   1. Generalized anxiety disorder  F41.1       Plan:  Patient is to use CBT and coping skills to decrease triggered responses.  Patient is to follow plans from session to get her brain focused on positive activities especially after her surgery.  Patient is to remind herself of all the progress she has made in treatment and to journal her insights.  Patient is to focus on what she can control fix and change she is to start writing them down and reading in them as she needs to.  Patient is to work on affirming herself regularly that she is safe and thinking through what to do to help her feel safe.  Patient is to start looking into the possibility of a service dog.  Patient is to work on Psychologist, sport and exercise on her mirror to repeat regularly.  Patient is to take things in small steps so that she is able to accomplish tasks.  Patient is to take medication as directed.   Patient is to work on focusing on the things that she can accomplish.  Patient is to work on Dr. Deadra Everts leaf on the neuro cycle detox.  Patient is to continue working with providers on medical issues   Peggy Ruiz, LCMHC

## 2023-11-21 ENCOUNTER — Ambulatory Visit (INDEPENDENT_AMBULATORY_CARE_PROVIDER_SITE_OTHER): Admitting: Psychiatry

## 2023-11-21 DIAGNOSIS — F411 Generalized anxiety disorder: Secondary | ICD-10-CM
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
## 2023-11-22 DIAGNOSIS — M961 Postlaminectomy syndrome, not elsewhere classified: Secondary | ICD-10-CM | POA: Diagnosis not present

## 2023-11-27 ENCOUNTER — Other Ambulatory Visit: Payer: Self-pay | Admitting: Internal Medicine

## 2023-11-27 DIAGNOSIS — E039 Hypothyroidism, unspecified: Secondary | ICD-10-CM

## 2023-11-28 ENCOUNTER — Encounter: Payer: Self-pay | Admitting: Internal Medicine

## 2023-11-30 ENCOUNTER — Ambulatory Visit: Payer: BC Managed Care – PPO | Admitting: Psychiatry

## 2023-11-30 DIAGNOSIS — F411 Generalized anxiety disorder: Secondary | ICD-10-CM

## 2023-11-30 NOTE — Progress Notes (Signed)
 [facility] Counselor/Therapist Progress Note  Patient ID: Peggy Ruiz, MRN: [id number],    Date: 11/30/2023  Time Spent: 54 minutes start time 2:07 PM end time 3:01 PM  Treatment Type: Individual Therapy  Reported Symptoms: anxiety, triggered responses, sadness,sleep issues, panic  Mental Status Exam:  Appearance:   Well Groomed     Behavior:  Appropriate  Motor:  Normal  Speech/Language:   Normal Rate  Affect:  Appropriate  Mood:  anxious  Thought process:  normal  Thought content:    WNL  Sensory/Perceptual disturbances:    WNL  Orientation:  oriented to person, place, time/date, and situation  Attention:  Good  Concentration:  Good  Memory:  WNL  Fund of knowledge:   Good  Insight:    Good  Judgment:   Good  Impulse Control:  Good   Risk Assessment: Danger to Self:  No Self-injurious Behavior: No Danger to Others: No Duty to Warn:no Physical Aggression / Violence:No  Access to Firearms a concern: No  Gang Involvement:No   Subjective: Patient was present for session. She shared she found out that her husband has started her new shift and that is hard for her. She shared that she is worried about her husband and what toll it will take on her body. She is also concerned she will not be able to sleep without him home. She shared she is already having a hard time. She is stopping her pain medication and is going through changes with other medication.  Patient stated the issues with her husband not being home in the evening where the thing creating the most anxiety for her.Processed noises at night and husband gone, SUDS level 9, negative cognition "I'm not safe" felt anxiety fear in her chest. Patient was able to reduce SUDS level to 4.  Patient was able to realize that she can just change her schedule to match his and that it is important for her to talk to him when he is on his way home as well as having time when he gets home to unwind together.  Patient stated she  was not sure that she could change her schedule but through the processing she realized that even though others have told her she could keep the schedule that she is currently on and she needs to change it to match her husband's schedule and that is an okay thing.  What she was able to realize that she reported a huge relief and that she could manage the whole situation more appropriately.  Interventions: Eye Movement Desensitization and Reprocessing (EMDR) and Insight-Oriented  Diagnosis:   ICD-10-CM   1. Generalized anxiety disorder  F41.1       Plan: Patient is to use CBT and coping skills to decrease triggered responses.  Patient is to change her schedule to match her husband's new work schedule so that they can continue having the time together they are used to.  Patient is to remind herself of all the progress she has made in treatment and to journal her insights.  Patient is to focus on what she can control fix and change she is to start writing them down and reading in them as she needs to.  Patient is to work on affirming herself regularly that she is safe and thinking through what to do to help her feel safe.  Patient is to start looking into the possibility of a service dog.  Patient is to work on Psychologist, sport and exercise on  her mirror to repeat regularly.  Patient is to take things in small steps so that she is able to accomplish tasks.  Patient is to take medication as directed.   Patient is to work on focusing on the things that she can accomplish.  Patient is to work on Dr. Deadra Everts leaf on the neuro cycle detox.  Patient is to continue working with providers on medical issues   Marlise Simpers, LCMHC

## 2023-12-05 ENCOUNTER — Encounter: Payer: Self-pay | Admitting: Neurosurgery

## 2023-12-05 ENCOUNTER — Ambulatory Visit (INDEPENDENT_AMBULATORY_CARE_PROVIDER_SITE_OTHER): Admitting: Neurosurgery

## 2023-12-05 VITALS — BP 108/74 | Temp 97.9°F | Ht 68.0 in | Wt 130.0 lb

## 2023-12-05 DIAGNOSIS — Z09 Encounter for follow-up examination after completed treatment for conditions other than malignant neoplasm: Secondary | ICD-10-CM

## 2023-12-05 DIAGNOSIS — M961 Postlaminectomy syndrome, not elsewhere classified: Secondary | ICD-10-CM

## 2023-12-05 DIAGNOSIS — G894 Chronic pain syndrome: Secondary | ICD-10-CM

## 2023-12-05 NOTE — Progress Notes (Signed)
   REFERRING PHYSICIAN:  Chanetta Comes [address] [place],  [place] [address]  DOS: 10/23/23 placement of [employer] SCS  HISTORY OF PRESENT ILLNESS: Peggy Ruiz is status post above surgery.   She is doing extremely well.  Her pain is much improved.  She is no longer using a walker.   PHYSICAL EXAMINATION:  General: Patient is well developed, well nourished, calm, collected, and in no apparent distress.   NEUROLOGICAL:  General: In no acute distress.   Awake, alert, oriented to person, place, and time.  Pupils equal round and reactive to light.  Facial tone is symmetric.     Strength:          Side Iliopsoas Quads Hamstring PF DF EHL  R 5 5 5 5 5 5   L 5 5 5 5 5 5    Incisions c/d/I  ROS (Neurologic):  Negative except as noted above  IMAGING: Nothing new to review.   ASSESSMENT/PLAN:  Peggy Ruiz is doing well s/p above surgery.   I am very pleased with how well she is doing.  We reviewed her activity limitations.  We will see her back on an as needed basis.  She will continue weaning her pain medications with her pain management doctor.  Jodeen Munch MD Department of neurosurgery

## 2023-12-12 ENCOUNTER — Ambulatory Visit: Admitting: Psychiatry

## 2023-12-12 DIAGNOSIS — F411 Generalized anxiety disorder: Secondary | ICD-10-CM

## 2023-12-12 NOTE — Progress Notes (Signed)
 [facility] Counselor/Therapist Progress Note  Patient ID: TNYA ADES, MRN: [id number],    Date: 12/12/2023  Time Spent: 60 minutes start time 4:00 PM end time 5:00 PM Virtual Visit via Video Note Connected with patient by a telemedicine/telehealth application, with their informed consent, and verified patient privacy and that I am speaking with the correct person using two identifiers. I discussed the limitations, risks, security and privacy concerns of performing psychotherapy and the availability of in person appointments. I also discussed with the patient that there may be a patient responsible charge related to this service. The patient expressed understanding and agreed to proceed. I discussed the treatment planning with the patient. The patient was provided an opportunity to ask questions and all were answered. The patient agreed with the plan and demonstrated an understanding of the instructions. The patient was advised to call  our office if  symptoms worsen or feel they are in a crisis state and need immediate contact.   Therapist Location: home Patient Location: home    Treatment Type: Individual Therapy  Reported Symptoms: sleep issues, fatigue, anxiety, panic, triggered responses, rumination, crying spells  Mental Status Exam:  Appearance:   Well Groomed     Behavior:  Appropriate  Motor:  Normal  Speech/Language:   Normal Rate  Affect:  Appropriate  Mood:  anxious  Thought process:  normal  Thought content:    WNL  Sensory/Perceptual disturbances:    WNL  Orientation:  oriented to person, place, time/date, and situation  Attention:  Good  Concentration:  Good  Memory:  WNL  Fund of knowledge:   Good  Insight:    Good  Judgment:   Good  Impulse Control:  Good   Risk Assessment: Danger to Self:  No Self-injurious Behavior: No Danger to Others: No Duty to Warn:no Physical Aggression / Violence:No  Access to Firearms a concern: No  Gang Involvement:No    Subjective: Met with patient via virtual session. She shared that she decided to follow plans from session and work her schedule around her husband's schedule for a few days but than she had a panic attack over her photographer having to cancel their appointment and she realized she couldn't do that because she needs more sleep.  She was still unable to fall asleep without him being home. She was able to do better the past few nights but she is still having anxiety around 11 at night. She stated that overall she feels good about her plan. Patient went on to share that they celebrated Father's Day and her dad's birthday at a restaurant in [place]. She explained that mom decided to bring her brother and it was triggering for her because he had a melt down and it no one could talk about anything. Discussed how to deal with the issue with her mother so that celebrations don't have to incorporate her brother all the time. Patient shared it is hard for her with all that is going on with her mother.  Encouraged her to see what is happening isn't working for any of them and with some changes everyone can have an enjoyable time. She was able to see that she does need to give her mother the options rather than going through her brother melting down. She is decreasing pain pills  and is hopeful she will be able to come off of them completely soon. She has gained 6 lbs which is great for her. Her pain is still much better  with the stimulator.  She is also noticing more energy so she is making lots of progress. She shared she is hopeful that she may be able to do more with the changes in her health. Encouraged her to take things one step at a time and to be open to what may happen. Discussed starting to put distance between appointments due to patient progress.  Interventions: Solution-Oriented/Positive Psychology and Insight-Oriented  Diagnosis:   ICD-10-CM   1. Generalized anxiety disorder  F41.1       Plan:   Patient is to use CBT and coping skills to decrease triggered responses. Patient is to remind herself of all the progress she has made in treatment and to journal her insights.  Patient is to focus on what she can control fix and change she is to start writing them down and reading in them as she needs to.  Patient is to work on affirming herself regularly that she is safe and thinking through what to do to help her feel safe.  Patient is to start looking into the possibility of a service dog.  Patient is to work on Psychologist, sport and exercise on her mirror to repeat regularly.  Patient is to take things in small steps so that she is able to accomplish tasks.  Patient is to take medication as directed.   Patient is to work on focusing on the things that she can accomplish.  Patient is to work on Dr. Deadra Everts leaf on the neuro cycle detox.  Patient is to continue working with providers on medical issues   Marlise Simpers, LCMHC

## 2023-12-25 ENCOUNTER — Encounter: Payer: Self-pay | Admitting: Neurosurgery

## 2023-12-25 DIAGNOSIS — Z9689 Presence of other specified functional implants: Secondary | ICD-10-CM

## 2023-12-25 DIAGNOSIS — G894 Chronic pain syndrome: Secondary | ICD-10-CM

## 2023-12-26 ENCOUNTER — Ambulatory Visit (INDEPENDENT_AMBULATORY_CARE_PROVIDER_SITE_OTHER): Admitting: Psychiatry

## 2023-12-26 DIAGNOSIS — F411 Generalized anxiety disorder: Secondary | ICD-10-CM

## 2023-12-26 NOTE — Progress Notes (Unsigned)
 [facility] Counselor/Therapist Progress Note  Patient ID: HILDEGARDE DUNAWAY, MRN: [id number],    Date: 12/26/2023  Time Spent: 60 minutes start time 3:58 PM end time 4:58 AM Virtual Visit via Video Note Connected with patient by a telemedicine/telehealth application, with their informed consent, and verified patient privacy and that I am speaking with the correct person using two identifiers. I discussed the limitations, risks, security and privacy concerns of performing psychotherapy and the availability of in person appointments. I also discussed with the patient that there may be a patient responsible charge related to this service. The patient expressed understanding and agreed to proceed. I discussed the treatment planning with the patient. The patient was provided an opportunity to ask questions and all were answered. The patient agreed with the plan and demonstrated an understanding of the instructions. The patient was advised to call  our office if  symptoms worsen or feel they are in a crisis state and need immediate contact.   Therapist Location: home Patient Location: home    Treatment Type: Individual Therapy  Reported Symptoms: anxiety, panic, sadness, triggered responses, pain, fatigue, crying spells, irritability, sleep issues  Mental Status Exam:  Appearance:   Casual     Behavior:  Appropriate  Motor:  Normal  Speech/Language:   Normal Rate  Affect:  Appropriate and Tearful  Mood:  anxious and sad  Thought process:  normal  Thought content:    WNL  Sensory/Perceptual disturbances:    WNL  Orientation:  oriented to person, place, time/date, and situation  Attention:  Good  Concentration:  Good  Memory:  WNL  Fund of knowledge:   Good  Insight:    Good  Judgment:   Good  Impulse Control:  Good   Risk Assessment: Danger to Self:  No Self-injurious Behavior: No Danger to Others: No Duty to Warn:no Physical Aggression / Violence:No  Access to Firearms a  concern: No  Gang Involvement:No   Subjective: Met with patient via virtual session. She shared the shift is not working for them at all. She shared that they aren't having time together and there is not enough time for them to be able to get everything completed.They are fighting more and her mood is not good. She has been able to get the grocery shopping completed on her own which was a great thing. She shared she is also sore because her muscles haven't worked that hard in years.  She went on to share that things are going to get worse soon due to another up coming shift change. Her mother is also going to have a stimulator trial and her friend is going to have a major surgery. She shared she is still withdrawing off of her pain meds and that is making it hard on her as well.  Encouraged patient to see that yes all of these things are very difficult but at the same time she is making lots of progress.  Discussed how she is able to do things that she has not been able to do in a long time.  Also even while she was discussing the issues she was able to use her tools and get perspective with her husband and agreed to discuss solutions.  Had patient think through what she would need to be able to get through the rest of the summer with her husband's current work situation.  Discussed the fact that it is temporary and she is already at third of the way through  the situation which is huge and the first thing she needs to do was to remind herself of that truth and keep using her CBT skills to stay focused on what she is thankful for rather than what she is frustrated about.  Patient was also able to realize that she needs some quality time with him and not to be running all day on Sundays which is the 1 day that they have together.  Patient also shared that she is finally able to have physical intimacy again and needs that connection with him.  Encouraged her to talk to him about making Sunday evenings or date nights  and after a certain time on Sunday they do not take on any more projects do any more errands and focused on their relationship.  Patient stated that it was hard for her to feel they needed to schedule sex so she was encouraged just to schedule daytime and see once she gets connected to him how it progresses.  Patient reported feeling that that was a good idea.  Also brainstormed different ideas of things that she can do to simplify her life including doing pick up orders or deliveries instead of having to go to all the different places go inside and wear herself out in the heat.  Patient was able to recognize that her husband is having to change his medication due to the work schedule and that is impacting some of his behaviors and she may need to be mindful that he is doing the best he can and be  patient with him on that situation.  Patient was encouraged to keep her brain engaged in positive things during the day when he is at work so she does not start ruminating.  She acknowledged that she will be able to go to her friends and check on her when she has her surgery which will give her something productive to do and be helpful as well.  Patient reported feeling positive about plans for session and agreed to continue working on her perspective so that she can get through the next 8 weeks.  Interventions: Cognitive Behavioral Therapy, Solution-Oriented/Positive Psychology, and Insight-Oriented  Diagnosis:   ICD-10-CM   1. Generalized anxiety disorder  F41.1       Plan:  Patient is to use CBT and coping skills to decrease triggered responses.  Patient is to follow through with plans from session concerning being able to get through the next 8 weeks while her husband is on a difficult shift at work.  Patient is to talk to her husband about plans that would include him as well.  Patient is to remind herself of all the progress she has made in treatment and to journal her insights.  Patient is to focus on  what she can control fix and change.  Patient is to work on Psychologist, sport and exercise on her mirror to repeat regularly.  Patient is to take things in small steps so that she is able to accomplish tasks.  Patient is to take medication as directed.  Patient is to work on Dr. Aleck leaf on the neuro cycle detox.  Patient is to continue working with providers on medical issues   Silvano Pacini, LCMHC

## 2024-01-03 DIAGNOSIS — N898 Other specified noninflammatory disorders of vagina: Secondary | ICD-10-CM | POA: Diagnosis not present

## 2024-01-03 DIAGNOSIS — N879 Dysplasia of cervix uteri, unspecified: Secondary | ICD-10-CM | POA: Diagnosis not present

## 2024-01-04 ENCOUNTER — Ambulatory Visit (INDEPENDENT_AMBULATORY_CARE_PROVIDER_SITE_OTHER): Payer: BC Managed Care – PPO | Admitting: Psychiatry

## 2024-01-04 ENCOUNTER — Encounter: Payer: Self-pay | Admitting: Psychiatry

## 2024-01-04 DIAGNOSIS — N898 Other specified noninflammatory disorders of vagina: Secondary | ICD-10-CM | POA: Diagnosis not present

## 2024-01-04 DIAGNOSIS — F411 Generalized anxiety disorder: Secondary | ICD-10-CM

## 2024-01-04 DIAGNOSIS — N879 Dysplasia of cervix uteri, unspecified: Secondary | ICD-10-CM | POA: Diagnosis not present
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
## 2024-01-16 DIAGNOSIS — M5416 Radiculopathy, lumbar region: Secondary | ICD-10-CM | POA: Diagnosis not present

## 2024-01-16 DIAGNOSIS — R5383 Other fatigue: Secondary | ICD-10-CM | POA: Diagnosis not present

## 2024-01-18 ENCOUNTER — Ambulatory Visit (INDEPENDENT_AMBULATORY_CARE_PROVIDER_SITE_OTHER): Admitting: Psychiatry

## 2024-01-18 DIAGNOSIS — F411 Generalized anxiety disorder: Secondary | ICD-10-CM | POA: Diagnosis not present

## 2024-01-18 NOTE — Progress Notes (Signed)
 [facility] Counselor/Therapist Progress Note  Patient ID: Peggy Ruiz, MRN: [id number],    Date: 01/18/2024  Time Spent: 51 minutes start time 1:05 PM end time 1:56 PM  Treatment Type: Individual Therapy  Reported Symptoms: anxiety, sadness, pain issues, triggered responses, rumination  Mental Status Exam:  Appearance:   Well Groomed     Behavior:  Appropriate  Motor:  Normal  Speech/Language:   Normal Rate  Affect:  Appropriate  Mood:  anxious  Thought process:  normal  Thought content:    WNL  Sensory/Perceptual disturbances:    Pain  Orientation:  oriented to person, place, time/date, and situation  Attention:  Good  Concentration:  Good  Memory:  WNL  Fund of knowledge:   Good  Insight:    Good  Judgment:   Good  Impulse Control:  Good   Risk Assessment: Danger to Self:  No Self-injurious Behavior: No Danger to Others: No Duty to Warn:no Physical Aggression / Violence:No  Access to Firearms a concern: No  Gang Involvement:No   Subjective: Patient was present for session. She shared her husband is back on 1st shift which is good but there is still transition. Her biopsy came back clear so she is okay. She was able to get her 10 th anniversary pictures taken and they are turning out good. She went on to share that she started PT and she was testing her strength and she found out that her right leg is weaker than her left leg.  She walks very stiff and guarded so it is going to take time to get stronger. She went on to share having to go back through her history with her which was very triggering for her. Her mom is responding well to her stimulator trial and that has been for her mood. She is still decreasing her pain medication and now is on an as needed basis. She shared she is still realizing that she has to take things slowly but it is hard for her. Discussed how to reframe things to make it more manageable. She is still helping her friend and she is not always  doing what she needs to for herself.  Discussed the importance of communicating her feelings with her friend and does not have to be judgmental it can just be honest about how she is feeling and what is going on with her.  As patient was to think through what she would want to stay she reported feeling better and agreed she would communicate with her friend.  Interventions: Assertiveness/Communication and Solution-Oriented/Positive Psychology  Diagnosis:   ICD-10-CM   1. Generalized anxiety disorder  F41.1       Plan: Patient is to use CBT and coping skills to decrease triggered responses.  Patient is to follow through with plans from session to indicate her feelings with her friend.  Patient is to remind herself of all the progress she has made in treatment and to journal her insights.  Patient is to focus on what she can control fix and change.  Patient is to work on Psychologist, sport and exercise on her mirror to repeat regularly.  Patient is to take things in small steps so that she is able to accomplish tasks.  Patient is to take medication as directed.  Patient is to work on Dr. Aleck leaf on the neuro cycle detox.  Patient is to continue working with providers on medical issues   Silvano Pacini, LCMHC

## 2024-01-19 DIAGNOSIS — M5416 Radiculopathy, lumbar region: Secondary | ICD-10-CM | POA: Diagnosis not present

## 2024-01-19 DIAGNOSIS — R5383 Other fatigue: Secondary | ICD-10-CM | POA: Diagnosis not present

## 2024-01-22 DIAGNOSIS — M5416 Radiculopathy, lumbar region: Secondary | ICD-10-CM | POA: Diagnosis not present

## 2024-01-22 DIAGNOSIS — R5383 Other fatigue: Secondary | ICD-10-CM | POA: Diagnosis not present

## 2024-01-23 DIAGNOSIS — F4310 Post-traumatic stress disorder, unspecified: Secondary | ICD-10-CM | POA: Diagnosis not present

## 2024-01-23 DIAGNOSIS — F32A Depression, unspecified: Secondary | ICD-10-CM | POA: Diagnosis not present

## 2024-01-23 DIAGNOSIS — F411 Generalized anxiety disorder: Secondary | ICD-10-CM | POA: Diagnosis not present

## 2024-01-24 ENCOUNTER — Ambulatory Visit: Payer: BC Managed Care – PPO | Admitting: Podiatry

## 2024-01-26 DIAGNOSIS — R5383 Other fatigue: Secondary | ICD-10-CM | POA: Diagnosis not present

## 2024-01-26 DIAGNOSIS — M5416 Radiculopathy, lumbar region: Secondary | ICD-10-CM | POA: Diagnosis not present

## 2024-01-30 DIAGNOSIS — R5383 Other fatigue: Secondary | ICD-10-CM | POA: Diagnosis not present

## 2024-01-30 DIAGNOSIS — M5416 Radiculopathy, lumbar region: Secondary | ICD-10-CM | POA: Diagnosis not present

## 2024-02-01 ENCOUNTER — Ambulatory Visit: Admitting: Psychiatry

## 2024-02-01 DIAGNOSIS — F411 Generalized anxiety disorder: Secondary | ICD-10-CM | POA: Diagnosis not present

## 2024-02-01 NOTE — Progress Notes (Signed)
 [facility] Counselor/Therapist Progress Note  Patient ID: Peggy Ruiz, MRN: [id number],    Date: 02/01/2024  Time Spent: 56 minutes start time 1:08 PM end time 2:04 PM  Treatment Type: Individual Therapy  Reported Symptoms: anxiety, sadness, triggering responses, rumination, pain issues, panic  Mental Status Exam:  Appearance:   Well Groomed     Behavior:  Appropriate  Motor:  Normal  Speech/Language:   Normal Rate  Affect:  Appropriate  Mood:  anxious  Thought process:  normal  Thought content:    WNL  Sensory/Perceptual disturbances:    Pain   Orientation:  oriented to person, place, time/date, and situation  Attention:  Good  Concentration:  Good  Memory:  WNL  Fund of knowledge:   Good  Insight:    Good  Judgment:   Good  Impulse Control:  Good   Risk Assessment: Danger to Self:  No Self-injurious Behavior: No Danger to Others: No Duty to Warn:no Physical Aggression / Violence:No  Access to Firearms a concern: No  Gang Involvement:No   Subjective: Patient was present for session. She shared that her hip started having lots of pain. She talked with her PT about the issue and she shared that her pelvis was not in alignment.  She is wondering if patient has EDS since she has so many joint issues and is hypermobile. 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 She has also signed up to start serving with [facility]. Her husband being on 1st shift has been a good thing. Her friend is going into a center at [facility] to help with her seizures. She went on to share that her parents have been calling with crisis calls. She went on to share she goes straight to panic when she gets those calls from her parents.  Patient also shared that her husband's psychiatrist and therapist are asking her to help with different tasks that they give him at home which feels like one more thing.  Patient stated that all the different situations together felt  overwhelming and she did not want to continue living with the panic.  Patient and the clinician Wrote out and Erhard through Each of the [employer] in Her Life Including Friend, Health, Mom, Dad, [place], and Cat.  After those things were written out the what she needed to do and eat situation was addressed with patient as well.  She was able to see that currently all she needs to do for her friend is pray and face time with her.  As far as her health she needs to continue working on taking her meds correctly diet working with a physical therapist and find ways to relax which were discussed.  When it comes to her mother she was able to see that rather than trying to fix her mom's problems she needs just to listen and pray for her.  With her dad the best thing she can do for him is talk to mom about making sure he takes his meds correctly every day so that is something that she will not have to address.  With her husband she can continue working with his homework making sure his meds are set up and listening to him as he talks about the grief of losing his grandfather.  And her cat she is going to make sure she gets to the vet and just loves on him.  Once patient was able to see  the different things that need to be done she reported feeling a sense of relief that it was doable and she could take things 1 step at a time and recognize that she does not have to fix everything she was also able to remember that many things that have happened over the past few several years have worked out in ways that she could not have imagined.  Encouraged her to start writing out the things that worked out so when she feels panic plea she can read that list.  Interventions: Cognitive Behavioral Therapy, Solution-Oriented/Positive Psychology, and Insight-Oriented  Diagnosis:   ICD-10-CM   1. Generalized anxiety disorder  F41.1       Plan:  Patient is to use CBT and coping skills to decrease triggered responses.  Patient is  to write out things that worked out well and read the list when she feels anxious.  Patient is to follow plans from session including talking to her mother about making sure her dad takes his medication regularly.  Patient is to remind herself of all the progress she has made in treatment and to journal her insights.  Patient is to focus on what she can control fix and change.  Patient is to work on Psychologist, sport and exercise on her mirror to repeat regularly.  Patient is to take things in small steps so that she is able to accomplish tasks.  Patient is to take medication as directed.  Patient is to work on Dr. Aleck leaf on the neuro cycle detox.  Patient is to continue working with providers on medical issues   Silvano Pacini, LCMHC

## 2024-02-02 DIAGNOSIS — M5416 Radiculopathy, lumbar region: Secondary | ICD-10-CM | POA: Diagnosis not present

## 2024-02-02 DIAGNOSIS — R5383 Other fatigue: Secondary | ICD-10-CM | POA: Diagnosis not present

## 2024-02-05 DIAGNOSIS — M5416 Radiculopathy, lumbar region: Secondary | ICD-10-CM | POA: Diagnosis not present

## 2024-02-05 DIAGNOSIS — R5383 Other fatigue: Secondary | ICD-10-CM | POA: Diagnosis not present

## 2024-02-08 DIAGNOSIS — M5416 Radiculopathy, lumbar region: Secondary | ICD-10-CM | POA: Diagnosis not present

## 2024-02-08 DIAGNOSIS — R5383 Other fatigue: Secondary | ICD-10-CM | POA: Diagnosis not present

## 2024-02-13 ENCOUNTER — Ambulatory Visit: Admitting: Psychiatry

## 2024-02-15 DIAGNOSIS — R5383 Other fatigue: Secondary | ICD-10-CM | POA: Diagnosis not present

## 2024-02-15 DIAGNOSIS — M5416 Radiculopathy, lumbar region: Secondary | ICD-10-CM | POA: Diagnosis not present

## 2024-02-16 ENCOUNTER — Ambulatory Visit (INDEPENDENT_AMBULATORY_CARE_PROVIDER_SITE_OTHER): Payer: BC Managed Care – PPO | Admitting: Internal Medicine

## 2024-02-16 ENCOUNTER — Encounter: Payer: Self-pay | Admitting: Internal Medicine

## 2024-02-16 ENCOUNTER — Other Ambulatory Visit: Payer: Self-pay

## 2024-02-16 VITALS — BP 110/80 | HR 88 | Temp 98.2°F | Resp 16 | Ht 68.0 in | Wt 142.2 lb

## 2024-02-16 DIAGNOSIS — R42 Dizziness and giddiness: Secondary | ICD-10-CM | POA: Diagnosis not present

## 2024-02-16 DIAGNOSIS — R000 Tachycardia, unspecified: Secondary | ICD-10-CM

## 2024-02-16 DIAGNOSIS — K3184 Gastroparesis: Secondary | ICD-10-CM

## 2024-02-16 DIAGNOSIS — E039 Hypothyroidism, unspecified: Secondary | ICD-10-CM

## 2024-02-16 DIAGNOSIS — M48061 Spinal stenosis, lumbar region without neurogenic claudication: Secondary | ICD-10-CM

## 2024-02-16 DIAGNOSIS — F339 Major depressive disorder, recurrent, unspecified: Secondary | ICD-10-CM

## 2024-02-16 MED ORDER — LEVOTHYROXINE SODIUM 50 MCG PO TABS: 50.0000 ug | ORAL_TABLET | Freq: Every day | ORAL | 1 refills | Status: AC

## 2024-02-16 NOTE — Progress Notes (Signed)
 Established Patient Office Visit  Subjective    Patient ID: ABBIEGAIL LANDGREN, female    DOB: 1986/06/09  Age: 38 y.o. MRN: [id number]  CC:  Chief Complaint  Patient presents with   Medical Management of Chronic Issues    6 month recheck    HPI MARGARETA LAUREANO presents to follow up on chronic medical conditions.   Discussed the use of AI scribe software for clinical note transcription with the patient, who gave verbal consent to proceed.  History of Present Illness Peggy Ruiz is a 38 year old female who presents with elevated heart rate and dizziness.  She underwent a spinal cord stimulator trial in early April, followed by implantation on April 28th, which involved a laminectomy at T8-T9 for lead placement and a second incision on the left side for the battery. Recovery required six weeks of restrictions on bending, lifting, twisting, reaching, and pulling. She is now almost four months post-op and reports significant improvement.  Recently, her heart rate has increased to 118-120 bpm with minimal exertion, such as walking short distances. She experiences dizziness and visual disturbances upon standing, despite adequate hydration and a healthy diet. Her physical therapist has also noted elevated heart rate and dizziness during sessions. Historically, her heart rate was around 95-100 bpm.  She has allergies to chlorhexidine  and surgical glue, which required the use of staples instead of Dermabond during her spinal surgery. A rash developed post-surgery, possibly due to iodine, although allergy tests were negative for iodine.  Her psychiatric medications include Rexulti  2 mg, Wellbutrin  150 mg, and Klonopin  0.5 mg in the morning and afternoon, with 1 mg at night. There have been no changes in these medications.  She has thyroid  issues, but recent tests were normal. She has gained weight since the beginning of the year, which is positive as she was previously losing weight.  She has  gastrointestinal issues, including gastroparesis and a change from constipation to diarrhea earlier this year. A colonoscopy in February was normal. She continues to take Prevacid, nortriptyline , and epitol for her GI symptoms.  She is currently participating in physical therapy twice a week to improve strength and range of motion after her surgery.   MDD: -Seeing psychiatric NP at [facility], following her to new practice  -Mood status: stable -Current treatment: Rexulti  2 mg, Wellbutrin  XL 150 mg, Klonopin  0.5 mg 3 times daily -Satisfied with current treatment?: yes -Duration of current treatment : chronic -Side effects: no Medication compliance: excellent compliance     02/16/2024   11:06 AM 08/24/2023   11:25 AM 08/10/2023    8:02 AM 06/05/2023    1:10 PM  Depression screen PHQ 2/9  Decreased Interest 0 0 0 0  Down, Depressed, Hopeless 0 1 1 1   PHQ - 2 Score 0 1 1 1   Altered sleeping  0  0  Tired, decreased energy  1  2  Change in appetite  1  1  Feeling bad or failure about yourself   1  2  Trouble concentrating  0  0  Moving slowly or fidgety/restless  0  0  Suicidal thoughts  0  0  PHQ-9 Score  4  6  Difficult doing work/chores  Not difficult at all     Hypothyroidism: -Medications: Levothyroxine  50 mcg -Patient is compliant with the above medication (s) at the above dose and reports no medication side effects.  -Denies weight changes, cold./heat intolerance, skin changes, anxiety/palpitations  -Last TSH: 2/25 0.92,  TPO antibody negative in the past when she was first diagnosed in 2019  Hx of Adenocarcinoma in situ of the cervix: -S/p robotic assisted hysterectomy with bilateral salpingectomy plus sentinel lymph node dissection on April 14, 2023. No invasive cancer after pathology evaluation -Still following with surgery at [facility] -Patient had a complication after surgery of having an allergic reaction to the Hibiclens  used to clean her skin and vagina.  Patient was  having severe itching and ended up being treated with steroids which then led her to have to UTIs.  She just finished her second round of antibiotics yesterday and is currently denying urinary symptoms. -Allergy of hibiclens , treated with steroid, then had 2 UTI's, just finished antibiotic yesterday   Gastroparesis/IBS:  -Following with GI, Dr. Lynita at [facility].  -Colonoscopy normal 2/25 -Currently on Prevacid 30 mg, magesium citrate, Nortriptyline  10 mg, Usodiol 300 mg BID for bile production and Zofran  PRN. Was recently started on a new medication Ibsrela, but hasn't noticed any changes in symptoms yet  Lumbar spinal stenosis: -Patient has a pre-existing history of L5-S1 fusion in 2018 but was having an exacerbation of symptoms and pain -Following with Dr. Katrina, neurosurgery and underwent spinal cord stimulator placement in April of this year -She does follow with Dr. Bonner, orthopedic surgeon at [facility] in [place] who is treating her pain with Nucynta 75 mgm, Gabapentin 800 mg BID  Health Maintenance: -Blood work UTD -Just underwent hysterectomy - having Paps yearly for the next few years. First one June 2025.   Outpatient Encounter Medications as of 02/16/2024  Medication Sig   brexpiprazole  (REXULTI ) 1 MG TABS tablet Take 2 mg by mouth every morning.   buPROPion  (WELLBUTRIN  XL) 150 MG 24 hr tablet Take 1 tablet (150 mg total) by mouth daily.   cholecalciferol (VITAMIN D3) 25 MCG (1000 UNIT) tablet Take 1,000 Units by mouth daily.   clonazePAM  (KLONOPIN ) 0.5 MG tablet Take 1 tablet (0.5 mg total) by mouth 2 (two) times daily AND 2 tablets (1 mg total) at bedtime. (Patient taking differently: Take 1 tablet (0.5 mg total) by mouth tid-1 tab in am 1 in the afternoon and 2 tablets (1 mg total) at bedtime.)   cloNIDine  (CATAPRES ) 0.1 MG tablet Take 0.1 mg by mouth 2 (two) times daily as needed (anxiety).   cyclobenzaprine  (FLEXERIL ) 10 MG tablet Take 1 tablet (10 mg total)  by mouth 3 (three) times daily as needed for muscle spasms.   docusate sodium (COLACE) 100 MG capsule Take 100-300 mg by mouth at bedtime as needed for mild constipation.   gabapentin (NEURONTIN) 800 MG tablet Take 800 mg by mouth 2 (two) times daily.   HYDROcodone -acetaminophen  (NORCO/VICODIN) 5-325 MG tablet Take 1 tablet by mouth 3 (three) times daily as needed.   lansoprazole (PREVACID) 30 MG capsule Take 30 mg by mouth 2 (two) times daily before a meal.   levothyroxine  (SYNTHROID ) 50 MCG tablet Take 1 tablet by mouth once daily   loratadine (CLARITIN) 10 MG tablet Take 10 mg by mouth daily as needed for allergies.   Magnesium Oxide, Laxative, 500 MG TABS Take 1,500 mg by mouth at bedtime.   Multiple Vitamin (MULTIVITAMIN) tablet Take 1 tablet by mouth daily.   nortriptyline  (PAMELOR ) 10 MG capsule Take 1 capsule (10 mg total) by mouth at bedtime.   ondansetron  (ZOFRAN -ODT) 8 MG disintegrating tablet Take 8 mg by mouth every 8 (eight) hours as needed for vomiting or nausea.   RESTASIS 0.05 % ophthalmic emulsion Place 1  drop into both eyes 2 (two) times daily.   ursodiol (ACTIGALL) 300 MG capsule Take 300 mg by mouth 2 (two) times daily.   NUCYNTA 75 MG tablet Take 75 mg by mouth every 6 (six) hours as needed for moderate pain (pain score 4-6). (Patient not taking: Reported on 02/16/2024)   No facility-administered encounter medications on file as of 02/16/2024.    Past Medical History:  Diagnosis Date   Adenocarcinoma of cervix (HCC)    Anemia    Anorexia    at 15   Anxiety    Attention deficit disorder (ADD)    Bone spur    Bulging lumbar disc    Chronic pain syndrome    Chronically dry eyes, bilateral    Complication of anesthesia    delayed emergence   Depression    Eczema    Endometriosis    Gastroparesis    GERD (gastroesophageal reflux disease)    HPV (human papilloma virus) infection    Hypothyroidism    Lyme disease    Mild mood disorder (HCC)    Nummular eczema     PONV (postoperative nausea and vomiting)    Pre-diabetes    PTSD (post-traumatic stress disorder)    Spinal stenosis of lumbar region    Thyroid  disease     Past Surgical History:  Procedure Laterality Date   ABDOMINAL HYSTERECTOMY     BACK SURGERY     3 different surgeries-lumbar   cervical cone ciopsy     CHOLECYSTECTOMY     DIAGNOSTIC LAPAROSCOPY     for endometriosis   EYE SURGERY     for lazy eye   HERNIA REPAIR     THORACIC LAMINECTOMY FOR SPINAL CORD STIMULATOR N/A 10/23/2023   Procedure: THORACIC LAMINECTOMY FOR SPINAL CORD STIMULATOR;  Surgeon: Clois Fret, MD;  Location: [facility] ORS;  Service: Neurosurgery;  Laterality: N/A;  THORACIC LAMINECTOMY FOR SPINAL CORD STIMULATOR    Family History  Problem Relation Age of Onset   Fibromyalgia Mother    Anxiety disorder Mother    Depression Mother    Depression Father    Anxiety disorder Father     Social History   Socioeconomic History   Marital status: Married    Spouse name: Not on file   Number of children: Not on file   Years of education: Not on file   Highest education level: Bachelor's degree (e.g., BA, AB, BS)  Occupational History   Not on file  Tobacco Use   Smoking status: Never   Smokeless tobacco: Never  Vaping Use   Vaping status: Never Used  Substance and Sexual Activity   Alcohol use: Yes    Comment: rare   Drug use: No   Sexual activity: Never    Birth control/protection: Surgical  Other Topics Concern   Not on file  Social History Narrative   Regular exercise: noneCaffeine use: daily. Recently moved from [place], [place]   Social Drivers of Health   Financial Resource Strain: Low Risk  (02/15/2024)   Overall Financial Resource Strain (CARDIA)    Difficulty of Paying Living Expenses: Not hard at all  Food Insecurity: No Food Insecurity (02/15/2024)   Hunger Vital Sign    Worried About Running Out of Food in the Last Year: Never true    Ran Out of Food in the Last Year: Never true   Transportation Needs: No Transportation Needs (02/15/2024)   PRAPARE - Administrator, Civil Service (Medical): No  Lack of Transportation (Non-Medical): No  Physical Activity: Insufficiently Active (02/15/2024)   Exercise Vital Sign    Days of Exercise per Week: 2 days    Minutes of Exercise per Session: 10 min  Stress: No Stress Concern Present (02/15/2024)   [employer] of Occupational Health - Occupational Stress Questionnaire    Feeling of Stress: Not at all  Social Connections: Socially Integrated (02/15/2024)   Social Connection and Isolation Panel    Frequency of Communication with Friends and Family: More than three times a week    Frequency of Social Gatherings with Friends and Family: Once a week    Attends Religious Services: More than 4 times per year    Active Member of [employer] or Organizations: Yes    Attends Engineer, structural: More than 4 times per year    Marital Status: Married  Catering manager Violence: Not At Risk (04/03/2023)   Received from [facility]   HITS    Over the last 12 months how often did your partner physically hurt you?: Never    Over the last 12 months how often did your partner insult you or talk down to you?: Never    Over the last 12 months how often did your partner threaten you with physical harm?: Never    Over the last 12 months how often did your partner scream or curse at you?: Never    Review of Systems  Cardiovascular:  Positive for palpitations. Negative for chest pain.  Neurological:  Positive for dizziness.        Objective    BP 110/80 (Cuff Size: Normal)   Pulse 88   Temp 98.2 F (36.8 C) (Oral)   Resp 16   Ht 5' 8 (1.727 m)   Wt 142 lb 3.2 oz (64.5 kg)   LMP 03/02/2017 (Exact Date)   SpO2 98%   BMI 21.62 kg/m   Physical Exam Constitutional:      Appearance: Normal appearance.  HENT:     Head: Normocephalic and atraumatic.     Mouth/Throat:     Mouth: Mucous membranes are moist.      Pharynx: Oropharynx is clear.  Eyes:     Extraocular Movements: Extraocular movements intact.     Conjunctiva/sclera: Conjunctivae normal.     Pupils: Pupils are equal, round, and reactive to light.  Neck:     Comments: No thyromegaly  Cardiovascular:     Rate and Rhythm: Normal rate and regular rhythm.     Heart sounds: Normal heart sounds.  Pulmonary:     Effort: Pulmonary effort is normal.     Breath sounds: Normal breath sounds.  Musculoskeletal:     Cervical back: No tenderness.  Lymphadenopathy:     Cervical: No cervical adenopathy.  Skin:    General: Skin is warm and dry.  Neurological:     General: No focal deficit present.     Mental Status: She is alert. Mental status is at baseline.  Psychiatric:        Mood and Affect: Mood normal.        Behavior: Behavior normal.         Assessment & Plan:   Assessment & Plan Orthostatic hypotension and tachycardia due to deconditioning Increased heart rate and dizziness upon standing suggest orthostatic hypotension due to deconditioning post-surgery. Differential includes thyroid  dysfunction. - Order EKG to assess heart rhythm, showing normal sinus rhythm, HR 97.  - Check thyroid  function tests. - Encourage continued physical therapy  to improve conditioning. - Advise increased hydration. - Schedule follow-up in 3 months to reassess symptoms. - Consider heart monitor if symptoms persist or worsen.  Status post spinal cord stimulator implantation Four months post-implantation with improved pain management. No current issues with the device. Managed with spinal cord stimulator and physical therapy. - Continue physical therapy twice weekly.  Gastroparesis Managed with Prevacid, nortriptyline , and epitol. Recent improvement in weight and gastrointestinal symptoms. - Continue current medications for gastroparesis.  Depression Managed with Rexulti , Wellbutrin , and Klonopin . - Continue current psychiatric medication  regimen.  Hypothyroidism Previously well-controlled with medication. Recent weight gain noted as positive. Thyroid  function to be re-evaluated due to potential contribution to tachycardia. - Refill thyroid  medication. - Check thyroid  function tests.  Multiple drug and adhesive allergies Allergies include chlorhexidine  and surgical glue. Concerns about adhesive allergy with potential heart monitor use. - Document allergies and consider alternatives for adhesive use.    Return in about 3 months (around 05/18/2024).   Sharyle Fischer, DO

## 2024-02-17 LAB — TSH: TSH: 0.96 m[IU]/L

## 2024-02-19 ENCOUNTER — Ambulatory Visit: Payer: Self-pay | Admitting: Internal Medicine

## 2024-02-28 ENCOUNTER — Ambulatory Visit: Payer: Self-pay | Admitting: *Deleted

## 2024-02-28 DIAGNOSIS — M5416 Radiculopathy, lumbar region: Secondary | ICD-10-CM | POA: Diagnosis not present

## 2024-02-28 DIAGNOSIS — R5383 Other fatigue: Secondary | ICD-10-CM | POA: Diagnosis not present

## 2024-02-28 NOTE — Telephone Encounter (Signed)
 FYI Only or Action Required?: FYI only for provider.  Patient was last seen in primary care on 02/16/2024 by Bernardo Fend, DO.  Called Nurse Triage reporting Shortness of Breath.  Symptoms began today.  Interventions attempted: Rest, hydration, or home remedies.  Symptoms are: gradually worsening.  Triage Disposition: Go to ED Now (Notify PCP)  Patient/caregiver understands and will follow disposition?: yes   Reason for Disposition  Extra heartbeats, irregular heart beating, or heart is beating very fast  (i.e., palpitations)  Answer Assessment - Initial Assessment Questions 1. RESPIRATORY STATUS: Describe your breathing? (e.g., wheezing, shortness of breath, unable to speak, severe coughing)      SOB, elevated HR- 115-120, dizziness 2. ONSET: When did this breathing problem begin?      Today- patient is worse 3. PATTERN Does the difficult breathing come and go, or has it been constant since it started?      Comes and goes- last 2 days more consistent 4. SEVERITY: How bad is your breathing? (e.g., mild, moderate, severe)      Out of breath with exertion 5. RECURRENT SYMPTOM: Have you had difficulty breathing before? If Yes, ask: When was the last time? and What happened that time?      OV  8/22- patient was seen for this 6. CARDIAC HISTORY: Do you have any history of heart disease? (e.g., heart attack, angina, bypass surgery, angioplasty)      no 7. LUNG HISTORY: Do you have any history of lung disease?  (e.g., pulmonary embolus, asthma, emphysema)     no 8. CAUSE: What do you think is causing the breathing problem?      Orthostatic hypertension- EKG 9. OTHER SYMPTOMS: Do you have any other symptoms? (e.g., chest pain, cough, dizziness, fever, runny nose)    Dizziness, elevated heart rate 10. O2 SATURATION MONITOR:  Do you use an oxygen saturation monitor (pulse oximeter) at home? If Yes, ask: What is your reading (oxygen level) today? What is  your usual oxygen saturation reading? (e.g., 95%)       99%  Protocols used: Breathing Difficulty-A-AH  Copied from CRM #[id number]. Topic: Clinical - Red Word Triage >> Feb 28, 2024 11:40 AM Amy B wrote: Red Word that prompted transfer to Nurse Triage: Short of breath.  Patient is experiencing SOB, dizziness, elevated heart rate

## 2024-02-29 ENCOUNTER — Ambulatory Visit: Admitting: Psychiatry

## 2024-02-29 DIAGNOSIS — F411 Generalized anxiety disorder: Secondary | ICD-10-CM
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
## 2024-03-01 DIAGNOSIS — M5416 Radiculopathy, lumbar region: Secondary | ICD-10-CM | POA: Diagnosis not present

## 2024-03-01 DIAGNOSIS — R5383 Other fatigue: Secondary | ICD-10-CM | POA: Diagnosis not present

## 2024-03-02 ENCOUNTER — Other Ambulatory Visit (HOSPITAL_COMMUNITY): Payer: Self-pay

## 2024-03-04 ENCOUNTER — Encounter: Payer: Self-pay | Admitting: Internal Medicine

## 2024-03-04 ENCOUNTER — Other Ambulatory Visit (HOSPITAL_COMMUNITY)
Admission: RE | Admit: 2024-03-04 | Discharge: 2024-03-04 | Disposition: A | Discharge status: Home / Self Care | Source: Ambulatory Visit | Attending: Internal Medicine | Admitting: Internal Medicine

## 2024-03-04 ENCOUNTER — Other Ambulatory Visit: Payer: Self-pay

## 2024-03-04 ENCOUNTER — Ambulatory Visit (INDEPENDENT_AMBULATORY_CARE_PROVIDER_SITE_OTHER): Admitting: Internal Medicine

## 2024-03-04 ENCOUNTER — Ambulatory Visit: Attending: Internal Medicine

## 2024-03-04 VITALS — BP 120/73 | HR 100 | Temp 98.1°F | Resp 18 | Wt 141.8 lb

## 2024-03-04 DIAGNOSIS — Z79899 Other long term (current) drug therapy: Secondary | ICD-10-CM | POA: Diagnosis not present

## 2024-03-04 DIAGNOSIS — R000 Tachycardia, unspecified: Secondary | ICD-10-CM

## 2024-03-04 DIAGNOSIS — M961 Postlaminectomy syndrome, not elsewhere classified: Secondary | ICD-10-CM | POA: Diagnosis not present

## 2024-03-04 DIAGNOSIS — N898 Other specified noninflammatory disorders of vagina: Secondary | ICD-10-CM | POA: Diagnosis not present

## 2024-03-04 DIAGNOSIS — M5412 Radiculopathy, cervical region: Secondary | ICD-10-CM | POA: Diagnosis not present

## 2024-03-04 DIAGNOSIS — M5416 Radiculopathy, lumbar region: Secondary | ICD-10-CM | POA: Diagnosis not present

## 2024-03-04 MED ORDER — FLUCONAZOLE 150 MG PO TABS: 150.0000 mg | ORAL_TABLET | Freq: Once | ORAL | 0 refills | Status: AC

## 2024-03-04 NOTE — Progress Notes (Signed)
 Acute Office Visit  Subjective:     Patient ID: Peggy Ruiz, female    DOB: May 21, 1986, 38 y.o.   MRN: [id number]  Chief Complaint  Patient presents with   Vaginal Discharge    Yellow/green discharge, odor for 1 week    Vaginal Discharge The patient's primary symptoms include vaginal discharge. Pertinent negatives include no chills, dysuria, fever, frequency, hematuria or urgency.   Patient is in today for vaginal discharge.   Discussed the use of AI scribe software for clinical note transcription with the patient, who gave verbal consent to proceed.  History of Present Illness Peggy Ruiz is a 38 year old female who presents with changes in vaginal discharge and painful intercourse.  She has experienced changes in vaginal discharge with an odor for the past month. Intercourse is significantly painful, described as 'sandpaper,' with worsening pain over the last three instances. There are no external skin changes or urinary issues beyond longstanding urinary urgency. Vaginal dryness has been present for a couple of years, and lubrication is used consistently.    Review of Systems  Constitutional:  Negative for chills and fever.  Cardiovascular:  Positive for palpitations.  Genitourinary:  Positive for vaginal discharge. Negative for dysuria, frequency, hematuria and urgency.        Objective:    BP 120/73 (Cuff Size: Normal)   Pulse 100   Temp 98.1 F (36.7 C) (Oral)   Resp 18   Wt 141 lb 12.8 oz (64.3 kg)   LMP 03/02/2017 (Exact Date)   SpO2 98%   BMI 21.56 kg/m  BP Readings from Last 3 Encounters:  03/04/24 120/73  02/16/24 110/80  12/05/23 108/74   Wt Readings from Last 3 Encounters:  03/04/24 141 lb 12.8 oz (64.3 kg)  02/16/24 142 lb 3.2 oz (64.5 kg)  12/05/23 130 lb (59 kg)      Physical Exam Constitutional:      Appearance: Normal appearance.  HENT:     Head: Normocephalic and atraumatic.  Eyes:     Conjunctiva/sclera: Conjunctivae normal.   Cardiovascular:     Rate and Rhythm: Regular rhythm. Tachycardia present.  Pulmonary:     Effort: Pulmonary effort is normal.     Breath sounds: Normal breath sounds.  Skin:    General: Skin is warm and dry.  Neurological:     General: No focal deficit present.     Mental Status: She is alert. Mental status is at baseline.  Psychiatric:        Mood and Affect: Mood normal.        Behavior: Behavior normal.     No results found for any visits on 03/04/24.      Assessment & Plan:   Assessment and Plan Assessment & Plan Vaginal discharge and dyspareunia under evaluation Differential diagnosis includes bacterial vaginosis, yeast infection, or trichomoniasis. Hysterectomy unlikely related. No external skin changes or urinary symptoms. Changes in vaginal flora can cause symptoms and infections can cause pain during intercourse. - Perform vaginal swab to test for bacterial vaginosis, yeast, and trichomoniasis. - Prescribe Diflucan  (fluconazole ) with instructions to take one pill today and a second pill in three days if symptoms persist. - Inform that results of the swab will be available in 2 days and further treatment will be based on results.  Tachycardia with exertional symptoms under evaluation Persistent tachycardia, dizziness, and shortness of breath with exertion. EKG showed normal sinus rhythm. Clonidine  helps reduce heart rate and symptoms. Potential for beta  blocker therapy discussed but deferred due to potential side effects and interference with cardiology evaluation. - Refer to cardiology for further evaluation. - Order Zio heart monitor to be worn for 14 days. - Advise to monitor blood pressure at home and document readings. - Instruct to take clonidine  as needed for symptoms.  - Cervicovaginal ancillary only - fluconazole  (DIFLUCAN ) 150 MG tablet; Take 1 tablet (150 mg total) by mouth once for 1 dose.  Dispense: 3 tablet; Refill: 0 - LONG TERM MONITOR (3-14 DAYS);  Future - Ambulatory referral to Cardiology   Return for already scheduled.  Sharyle Fischer, DO

## 2024-03-05 ENCOUNTER — Ambulatory Visit: Payer: Self-pay | Admitting: Internal Medicine

## 2024-03-05 DIAGNOSIS — M5416 Radiculopathy, lumbar region: Secondary | ICD-10-CM | POA: Diagnosis not present

## 2024-03-05 DIAGNOSIS — R5383 Other fatigue: Secondary | ICD-10-CM | POA: Diagnosis not present

## 2024-03-05 LAB — CERVICOVAGINAL ANCILLARY ONLY
Bacterial Vaginitis (gardnerella): NEGATIVE
Candida Glabrata: NEGATIVE
Candida Vaginitis: NEGATIVE
Chlamydia: NEGATIVE
Comment: NEGATIVE
Comment: NEGATIVE
Comment: NEGATIVE
Comment: NEGATIVE
Comment: NEGATIVE
Comment: NORMAL
Neisseria Gonorrhea: NEGATIVE
Trichomonas: NEGATIVE

## 2024-03-06 DIAGNOSIS — K219 Gastro-esophageal reflux disease without esophagitis: Secondary | ICD-10-CM | POA: Diagnosis not present

## 2024-03-06 DIAGNOSIS — K5904 Chronic idiopathic constipation: Secondary | ICD-10-CM | POA: Diagnosis not present

## 2024-03-06 DIAGNOSIS — K582 Mixed irritable bowel syndrome: Secondary | ICD-10-CM | POA: Diagnosis not present

## 2024-03-06 DIAGNOSIS — K30 Functional dyspepsia: Secondary | ICD-10-CM | POA: Diagnosis not present

## 2024-03-07 DIAGNOSIS — R5383 Other fatigue: Secondary | ICD-10-CM | POA: Diagnosis not present

## 2024-03-07 DIAGNOSIS — M5416 Radiculopathy, lumbar region: Secondary | ICD-10-CM | POA: Diagnosis not present

## 2024-03-12 ENCOUNTER — Ambulatory Visit: Admitting: Psychiatry

## 2024-03-12 DIAGNOSIS — M5416 Radiculopathy, lumbar region: Secondary | ICD-10-CM | POA: Diagnosis not present

## 2024-03-12 DIAGNOSIS — R5383 Other fatigue: Secondary | ICD-10-CM | POA: Diagnosis not present

## 2024-03-13 ENCOUNTER — Ambulatory Visit: Attending: Cardiology | Admitting: Cardiology

## 2024-03-13 ENCOUNTER — Encounter: Payer: Self-pay | Admitting: Cardiology

## 2024-03-13 ENCOUNTER — Ambulatory Visit (INDEPENDENT_AMBULATORY_CARE_PROVIDER_SITE_OTHER): Admitting: Psychiatry

## 2024-03-13 VITALS — BP 110/78 | HR 102 | Ht 68.0 in | Wt 143.6 lb

## 2024-03-13 DIAGNOSIS — F411 Generalized anxiety disorder: Secondary | ICD-10-CM | POA: Diagnosis not present

## 2024-03-13 DIAGNOSIS — R0600 Dyspnea, unspecified: Secondary | ICD-10-CM | POA: Diagnosis not present

## 2024-03-13 DIAGNOSIS — R000 Tachycardia, unspecified: Secondary | ICD-10-CM

## 2024-03-13 MED ORDER — METOPROLOL SUCCINATE ER 25 MG PO TB24
25.0000 mg | ORAL_TABLET | Freq: Every day | ORAL | 3 refills | Status: DC
Start: 2024-03-13 — End: 2024-04-29

## 2024-03-13 NOTE — Progress Notes (Signed)
 Cardiology Office Note:    Date:  03/13/2024   ID:  Peggy Ruiz, DOB Feb 07, 1986, MRN [id number]  PCP:  Bernardo Fend, DO   [facility] Providers Cardiologist:  None     Referring MD: Bernardo Fend, DO   Chief Complaint  Patient presents with   Establish Care    New pt has complaints of chest pain X2 days, no chest pressure or some very easily SOB, medciation reviewed verbally with patient   Peggy Ruiz is a 38 y.o. female who is being seen today for the evaluation of tachycardia at the request of Bernardo Fend, DO.   History of Present Illness:    SHERNITA Ruiz is a 38 y.o. female with a hx of hypothyroidism, depression, lumbar spinal stenosis presenting due to elevated heart rates and shortness of breath.  States having elevated baseline heart rates for several months now, seem to have worsened over the past month or so.  Also endorses shortness of breath with minimal exertion.  She has spinal stenosis, undergoes physical therapy.  Finding it hard to perform exercises due to shortness of breath.  Baseline heart rates ranging in the low 100s to 120s at rest.  Denies taking any stimulants, drinks 1 cup of coffee a day.  Has occasional dizziness associated with moving from sitting to standing, but denies syncope.  Has a history of panic attacks, takes clonidine  as needed for elevated heart rates, panic attacks.  Last dose of clonidine  was about 3 to 4 weeks ago.  Cardiac monitor released by primary care physician.  Past Medical History:  Diagnosis Date   Adenocarcinoma of cervix (HCC)    Anemia    Anorexia    at 15   Anxiety    Attention deficit disorder (ADD)    Bone spur    Bulging lumbar disc    Chronic pain syndrome    Chronically dry eyes, bilateral    Complication of anesthesia    delayed emergence   Depression    Eczema    Endometriosis    Gastroparesis    GERD (gastroesophageal reflux disease)    HPV (human papilloma virus) infection     Hypothyroidism    Lyme disease    Mild mood disorder (HCC)    Nummular eczema    PONV (postoperative nausea and vomiting)    Pre-diabetes    PTSD (post-traumatic stress disorder)    Spinal stenosis of lumbar region    Thyroid  disease     Past Surgical History:  Procedure Laterality Date   ABDOMINAL HYSTERECTOMY     BACK SURGERY     3 different surgeries-lumbar   cervical cone ciopsy     CHOLECYSTECTOMY     DIAGNOSTIC LAPAROSCOPY     for endometriosis   EYE SURGERY     for lazy eye   HERNIA REPAIR     THORACIC LAMINECTOMY FOR SPINAL CORD STIMULATOR N/A 10/23/2023   Procedure: THORACIC LAMINECTOMY FOR SPINAL CORD STIMULATOR;  Surgeon: Clois Fret, MD;  Location: [facility] ORS;  Service: Neurosurgery;  Laterality: N/A;  THORACIC LAMINECTOMY FOR SPINAL CORD STIMULATOR    Current Medications: Current Meds  Medication Sig   brexpiprazole  (REXULTI ) 1 MG TABS tablet Take 2 mg by mouth every morning.   buPROPion  (WELLBUTRIN  XL) 150 MG 24 hr tablet Take 1 tablet (150 mg total) by mouth daily.   cholecalciferol (VITAMIN D3) 25 MCG (1000 UNIT) tablet Take 1,000 Units by mouth daily.   clonazePAM  (KLONOPIN ) 0.5 MG tablet  Take 1 tablet (0.5 mg total) by mouth 2 (two) times daily AND 2 tablets (1 mg total) at bedtime.   cyclobenzaprine  (FLEXERIL ) 10 MG tablet Take 1 tablet (10 mg total) by mouth 3 (three) times daily as needed for muscle spasms.   docusate sodium (COLACE) 100 MG capsule Take 100-300 mg by mouth at bedtime as needed for mild constipation.   gabapentin (NEURONTIN) 800 MG tablet Take 800 mg by mouth 2 (two) times daily.   HYDROcodone -acetaminophen  (NORCO/VICODIN) 5-325 MG tablet Take 1 tablet by mouth 3 (three) times daily as needed.   lansoprazole (PREVACID) 30 MG capsule Take 30 mg by mouth 2 (two) times daily before a meal.   levothyroxine  (SYNTHROID ) 50 MCG tablet Take 1 tablet (50 mcg total) by mouth daily.   loratadine (CLARITIN) 10 MG tablet Take 10 mg by mouth daily  as needed for allergies.   Magnesium Oxide, Laxative, 500 MG TABS Take 1,500 mg by mouth at bedtime.   metoprolol  succinate (TOPROL  XL) 25 MG 24 hr tablet Take 1 tablet (25 mg total) by mouth daily.   Multiple Vitamin (MULTIVITAMIN) tablet Take 1 tablet by mouth daily.   nortriptyline  (PAMELOR ) 10 MG capsule Take 1 capsule (10 mg total) by mouth at bedtime.   ondansetron  (ZOFRAN -ODT) 8 MG disintegrating tablet Take 8 mg by mouth every 8 (eight) hours as needed for vomiting or nausea.   RESTASIS 0.05 % ophthalmic emulsion Place 1 drop into both eyes 2 (two) times daily.   ursodiol (ACTIGALL) 300 MG capsule Take 300 mg by mouth 2 (two) times daily.   [DISCONTINUED] cloNIDine  (CATAPRES ) 0.1 MG tablet Take 0.1 mg by mouth 2 (two) times daily as needed (anxiety).     Allergies:   Benzyl alcohol, Benzoin, Chlorhexidine , Promethazine, Scopolamine, Ciprofloxacin, Cyanoacrylate, Dilaudid [hydromorphone], Epinephrine , Penicillins, Toradol  [ketorolac  tromethamine ], and Bactrim [sulfamethoxazole-trimethoprim]   Social History   Socioeconomic History   Marital status: Married    Spouse name: Not on file   Number of children: Not on file   Years of education: Not on file   Highest education level: Bachelor's degree (e.g., BA, AB, BS)  Occupational History   Not on file  Tobacco Use   Smoking status: Never   Smokeless tobacco: Never  Vaping Use   Vaping status: Never Used  Substance and Sexual Activity   Alcohol use: Yes    Comment: rare   Drug use: No   Sexual activity: Never    Birth control/protection: Surgical  Other Topics Concern   Not on file  Social History Narrative   Regular exercise: noneCaffeine use: daily. Recently moved from [place], [place]   Social Drivers of Health   Financial Resource Strain: Low Risk  (03/05/2024)   Received from [facility]   Overall Financial Resource Strain (CARDIA)    How hard is it for you to pay for the very basics like food, housing, medical  care, and heating?: Not hard at all  Food Insecurity: No Food Insecurity (03/05/2024)   Received from [facility]   Hunger Vital Sign    Within the past 12 months, you worried that your food would run out before you got the money to buy more.: Never true    Within the past 12 months, the food you bought just didn't last and you didn't have money to get more.: Never true  Transportation Needs: No Transportation Needs (03/05/2024)   Received from [facility] - Transportation    In the past 12 months,  has lack of transportation kept you from medical appointments or from getting medications?: No    In the past 12 months, has lack of transportation kept you from meetings, work, or from getting things needed for daily living?: No  Physical Activity: Insufficiently Active (03/05/2024)   Received from [facility]   Exercise Vital Sign    On average, how many days per week do you engage in moderate to strenuous exercise (like a brisk walk)?: 2 days    On average, how many minutes do you engage in exercise at this level?: 10 min  Stress: No Stress Concern Present (03/05/2024)   Received from [facility] Occupational Health - Occupational Stress Questionnaire    Do you feel stress - tense, restless, nervous, or anxious, or unable to sleep at night because your mind is troubled all the time - these days?: Only a little  Social Connections: Somewhat Isolated (03/05/2024)   Received from [facility]   Social Network    How would you rate your social network (family, work, friends)?: Restricted participation with some degree of social isolation     Family History: The patient's family history includes Anxiety disorder in her father and mother; Depression in her father and mother; Fibromyalgia in her mother; Heart attack in her mother; Hypertension in her mother.  ROS:   Please see the history of present illness.     All other systems reviewed and are  negative.  EKGs/Labs/Other Studies Reviewed:    The following studies were reviewed today:  EKG Interpretation Date/Time:  Wednesday March 13 2024 10:16:44 EDT Ventricular Rate:  102 PR Interval:  158 QRS Duration:  82 QT Interval:  336 QTC Calculation: 437 R Axis:   16  Text Interpretation: Sinus tachycardia Septal infarct , age undetermined Confirmed by Darliss Rogue ([id number]) on 03/13/2024 10:21:37 AM    Recent Labs: 08/24/2023: ALT 16; BUN 15; Creat 0.85; Hemoglobin 12.4; Platelets 241; Potassium 4.3; Sodium 138 02/16/2024: TSH 0.96  Recent Lipid Panel    Component Value Date/Time   CHOL 141 08/24/2023 1151   TRIG 66 08/24/2023 1151   HDL 55 08/24/2023 1151   CHOLHDL 2.6 08/24/2023 1151   LDLCALC 71 08/24/2023 1151     Risk Assessment/Calculations:             Physical Exam:    VS:  BP 110/78 (BP Location: Left Arm, Patient Position: Sitting, Cuff Size: Normal)   Pulse (!) 102   Ht 5' 8 (1.727 m)   Wt 143 lb 9.6 oz (65.1 kg)   LMP 03/02/2017 (Exact Date)   SpO2 99%   BMI 21.83 kg/m     Wt Readings from Last 3 Encounters:  03/13/24 143 lb 9.6 oz (65.1 kg)  03/04/24 141 lb 12.8 oz (64.3 kg)  02/16/24 142 lb 3.2 oz (64.5 kg)     GEN:  Well nourished, well developed in no acute distress HEENT: Normal NECK: No JVD; No carotid bruits CARDIAC: RRR, no murmurs, rubs, gallops RESPIRATORY:  Clear to auscultation without rales, wheezing or rhonchi  ABDOMEN: Soft, non-tender, non-distended MUSCULOSKELETAL:  No edema; No deformity  SKIN: Warm and dry NEUROLOGIC:  Alert and oriented x 3 PSYCHIATRIC:  Normal affect   ASSESSMENT:    1. Tachycardia   2. Dyspnea, unspecified type    PLAN:    In order of problems listed above:  Tachycardia, EKG showing sinus tachycardia heart rate 102.  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  Will  evaluate cardiac monitor results when available.  Start Toprol -XL 25 mg daily.  Stop clonidine . Dyspnea, obtain  echo to rule out any structural abnormalities.  Symptoms are not consistent with angina.  Has no cardiac risk factors.  Follow-up after echo, cardiac monitor.      Medication Adjustments/Labs and Tests Ordered: Current medicines are reviewed at length with the patient today.  Concerns regarding medicines are outlined above.  Orders Placed This Encounter  Procedures   EKG 12-Lead   ECHOCARDIOGRAM COMPLETE   Meds ordered this encounter  Medications   metoprolol  succinate (TOPROL  XL) 25 MG 24 hr tablet    Sig: Take 1 tablet (25 mg total) by mouth daily.    Dispense:  30 tablet    Refill:  3    Patient Instructions  Medication Instructions:  -STOP clonidine   -START metoprolol  25 mg daily   *If you need a refill on your cardiac medications before your next appointment, please call your pharmacy*  Lab Work: No labs ordered today  If you have labs (blood work) drawn today and your tests are completely normal, you will receive your results only by: MyChart Message (if you have MyChart) OR A paper copy in the mail If you have any lab test that is abnormal or we need to change your treatment, we will call you to review the results.  Testing/Procedures: Your physician has requested that you have an echocardiogram. Echocardiography is a painless test that uses sound waves to create images of your heart. It provides your doctor with information about the size and shape of your heart and how well your heart's chambers and valves are working.   You may receive an ultrasound enhancing agent through an IV if needed to better visualize your heart during the echo. This procedure takes approximately one hour.  There are no restrictions for this procedure.  This will take place at [address], [place] [address]  Please note: We ask at that you not bring children with you during ultrasound (echo/ vascular) testing. Due to room size and safety concerns,  children are not allowed in the ultrasound rooms during exams. Our front office staff cannot provide observation of children in our lobby area while testing is being conducted. An adult accompanying a patient to their appointment will only be allowed in the ultrasound room at the discretion of the ultrasound technician under special circumstances. We apologize for any inconvenience.   Follow-Up: At [facility], you and your health needs are our priority.  As part of our continuing mission to provide you with exceptional heart care, our providers are all part of one team.  This team includes your primary Cardiologist (physician) and Advanced Practice Providers or APPs (Physician Assistants and Nurse Practitioners) who all work together to provide you with the care you need, when you need it.  Your next appointment:   3 month(s)  Provider:   You may see Dr. Darliss or one of the following Advanced Practice Providers on your designated Care Team:   Lonni Meager, NP Lesley Maffucci, PA-C Bernardino Bring, PA-C Cadence Nenana, PA-C Tylene Lunch, NP Barnie Hila, NP    We recommend signing up for the patient portal called MyChart.  Sign up information is provided on this After Visit Summary.  MyChart is used to connect with patients for Virtual Visits (Telemedicine).  Patients are able to view lab/test results, encounter notes, upcoming appointments, etc.  Non-urgent messages can be sent to your provider  as well.   To learn more about what you can do with MyChart, go to [email or web address].              Signed, Redell Cave, MD  03/13/2024 10:57 AM    [facility]

## 2024-03-13 NOTE — Progress Notes (Unsigned)
 [facility] Counselor/Therapist Progress Note  Patient ID: Peggy Ruiz, MRN: [id number],    Date: 03/13/2024  Time Spent: 58 minutes start time 3:59 PM end time  4:59 PM  Treatment Type: Individual Therapy  Reported Symptoms: anxiety, fatigue, health issues, sadness  Mental Status Exam:  Appearance:   Well Groomed     Behavior:  Appropriate  Motor:  Normal  Speech/Language:   Normal Rate  Affect:  Appropriate  Mood:  anxious  Thought process:  normal  Thought content:    WNL  Sensory/Perceptual disturbances:    WNL  Orientation:  oriented to person, place, time/date, and situation  Attention:  Good  Concentration:  Good  Memory:  WNL  Fund of knowledge:   Good  Insight:    Good  Judgment:   Good  Impulse Control:  Good   Risk Assessment: Danger to Self:  No Self-injurious Behavior: No Danger to Others: No Duty to Warn:no Physical Aggression / Violence:No  Access to Firearms a concern: No  Gang Involvement:No   Subjective: Patient was present for session. She shared she is having to wear a heart monitor and is working with a cardiologist due to her heart rate issues. It doesn't take much for her to start getting dizzy and nausea like she is going to pass out. She can't get her echo until November so until than she is going to be on a blood pressure medication. She shared she has been having issues in PT and they wanted her to see a cardiologist. She shared she is extremely fatigued. She is having pain during sex so she is going to her doctor on that issue as well. She shared it is a never ending cycle of her health issues. Her mom's pain is better since her stimulator is turned on which is a god thing. Her dad is very depressed currently and going through med changes so that is hard as well.  Patient went on to share that she is trying to do better about keeping perspective but is very difficult.  She went on to share that currently she and her husband struggles since  he told her that she is very critical.  Had patient think through the situation and see what the pattern is and what was getting triggered for each of them.  Had patient do mindfulness exercise and discussed how her amygdala starts over functioning and she goes into flight fight or freeze and he does as well it is just that it seems to trigger each other.  Discussed ways to help calm that part of her brain so she can focus better and encouraged her to talk to her husband about that as well.  Interventions: Dialectical Behavioral Therapy, Mindfulness Meditation, and Solution-Oriented/Positive Psychology  Diagnosis:   ICD-10-CM   1. Generalized anxiety disorder  F41.1       Plan:  Patient is to use CBT , DBT, and coping skills to decrease triggered responses.  Patient is to write out things that worked out well and read the list when she feels anxious.  Patient is to follow plans from session to set a limit with her mother.  Patient is to remind herself of all the progress she has made in treatment and to journal her insights.  Patient is to focus on what she can control fix and change.  Patient is to work on Psychologist, sport and exercise on her mirror to repeat regularly.  Patient is to take things in small steps  so that she is able to accomplish tasks.  Patient is to take medication as directed.  Patient is to work on Dr. Aleck leaf on the neuro cycle detox.  Patient is to continue working with providers on medical issues     Silvano Pacini, LCMHC

## 2024-03-13 NOTE — Patient Instructions (Signed)
 Medication Instructions:  -STOP clonidine   -START metoprolol  25 mg daily   *If you need a refill on your cardiac medications before your next appointment, please call your pharmacy*  Lab Work: No labs ordered today  If you have labs (blood work) drawn today and your tests are completely normal, you will receive your results only by: MyChart Message (if you have MyChart) OR A paper copy in the mail If you have any lab test that is abnormal or we need to change your treatment, we will call you to review the results.  Testing/Procedures: Your physician has requested that you have an echocardiogram. Echocardiography is a painless test that uses sound waves to create images of your heart. It provides your doctor with information about the size and shape of your heart and how well your heart's chambers and valves are working.   You may receive an ultrasound enhancing agent through an IV if needed to better visualize your heart during the echo. This procedure takes approximately one hour.  There are no restrictions for this procedure.  This will take place at [address] [facility] [address], [place] [address]  Please note: We ask at that you not bring children with you during ultrasound (echo/ vascular) testing. Due to room size and safety concerns, children are not allowed in the ultrasound rooms during exams. Our front office staff cannot provide observation of children in our lobby area while testing is being conducted. An adult accompanying a patient to their appointment will only be allowed in the ultrasound room at the discretion of the ultrasound technician under special circumstances. We apologize for any inconvenience.   Follow-Up: At [facility], you and your health needs are our priority.  As part of our continuing mission to provide you with exceptional heart care, our providers are all part of one team.  This team includes your primary Cardiologist (physician)  and Advanced Practice Providers or APPs (Physician Assistants and Nurse Practitioners) who all work together to provide you with the care you need, when you need it.  Your next appointment:   3 month(s)  Provider:   You may see Dr. Darliss or one of the following Advanced Practice Providers on your designated Care Team:   Lonni Meager, NP Lesley Maffucci, PA-C Bernardino Bring, PA-C Cadence [place], PA-C Tylene Lunch, NP Barnie Hila, NP    We recommend signing up for the patient portal called MyChart.  Sign up information is provided on this After Visit Summary.  MyChart is used to connect with patients for Virtual Visits (Telemedicine).  Patients are able to view lab/test results, encounter notes, upcoming appointments, etc.  Non-urgent messages can be sent to your provider as well.   To learn more about what you can do with MyChart, go to [email or web address].

## 2024-03-14 DIAGNOSIS — M5416 Radiculopathy, lumbar region: Secondary | ICD-10-CM | POA: Diagnosis not present

## 2024-03-14 DIAGNOSIS — R5383 Other fatigue: Secondary | ICD-10-CM | POA: Diagnosis not present

## 2024-03-19 DIAGNOSIS — R232 Flushing: Secondary | ICD-10-CM | POA: Diagnosis not present

## 2024-03-19 DIAGNOSIS — Z90710 Acquired absence of both cervix and uterus: Secondary | ICD-10-CM | POA: Diagnosis not present

## 2024-03-19 DIAGNOSIS — K219 Gastro-esophageal reflux disease without esophagitis: Secondary | ICD-10-CM | POA: Diagnosis not present

## 2024-03-19 DIAGNOSIS — E28319 Asymptomatic premature menopause: Secondary | ICD-10-CM | POA: Diagnosis not present

## 2024-03-19 DIAGNOSIS — E039 Hypothyroidism, unspecified: Secondary | ICD-10-CM | POA: Diagnosis not present

## 2024-03-19 DIAGNOSIS — Z79899 Other long term (current) drug therapy: Secondary | ICD-10-CM | POA: Diagnosis not present

## 2024-03-19 DIAGNOSIS — Z8541 Personal history of malignant neoplasm of cervix uteri: Secondary | ICD-10-CM | POA: Diagnosis not present

## 2024-03-19 DIAGNOSIS — Z90722 Acquired absence of ovaries, bilateral: Secondary | ICD-10-CM | POA: Diagnosis not present

## 2024-03-20 DIAGNOSIS — M5416 Radiculopathy, lumbar region: Secondary | ICD-10-CM | POA: Diagnosis not present

## 2024-03-20 DIAGNOSIS — R5383 Other fatigue: Secondary | ICD-10-CM | POA: Diagnosis not present

## 2024-03-22 ENCOUNTER — Ambulatory Visit: Admitting: Internal Medicine

## 2024-03-26 DIAGNOSIS — R5383 Other fatigue: Secondary | ICD-10-CM | POA: Diagnosis not present

## 2024-03-26 DIAGNOSIS — M5416 Radiculopathy, lumbar region: Secondary | ICD-10-CM | POA: Diagnosis not present

## 2024-03-28 ENCOUNTER — Ambulatory Visit: Admitting: Psychiatry

## 2024-03-28 DIAGNOSIS — F411 Generalized anxiety disorder: Secondary | ICD-10-CM | POA: Diagnosis not present
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
## 2024-03-29 DIAGNOSIS — R000 Tachycardia, unspecified: Secondary | ICD-10-CM | POA: Diagnosis not present

## 2024-04-01 ENCOUNTER — Encounter: Payer: Self-pay | Admitting: Cardiology

## 2024-04-02 DIAGNOSIS — R5383 Other fatigue: Secondary | ICD-10-CM | POA: Diagnosis not present

## 2024-04-02 DIAGNOSIS — M5416 Radiculopathy, lumbar region: Secondary | ICD-10-CM | POA: Diagnosis not present

## 2024-04-04 DIAGNOSIS — M5416 Radiculopathy, lumbar region: Secondary | ICD-10-CM | POA: Diagnosis not present

## 2024-04-04 DIAGNOSIS — R5383 Other fatigue: Secondary | ICD-10-CM | POA: Diagnosis not present

## 2024-04-09 DIAGNOSIS — M5416 Radiculopathy, lumbar region: Secondary | ICD-10-CM | POA: Diagnosis not present

## 2024-04-09 DIAGNOSIS — R5383 Other fatigue: Secondary | ICD-10-CM | POA: Diagnosis not present

## 2024-04-11 ENCOUNTER — Ambulatory Visit: Admitting: Psychiatry

## 2024-04-11 DIAGNOSIS — F411 Generalized anxiety disorder: Secondary | ICD-10-CM | POA: Diagnosis not present
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
## 2024-04-15 ENCOUNTER — Telehealth: Payer: Self-pay | Admitting: Neurosurgery

## 2024-04-15 MED ORDER — METHYLPREDNISOLONE 4 MG PO TBPK
ORAL_TABLET | ORAL | 0 refills | Status: DC
Start: 2024-04-15 — End: 2024-04-24

## 2024-04-15 NOTE — Telephone Encounter (Signed)
 Let her know dose pack was sent to [facility] on [address]. She has taken before but remind her she can feel jittery and/or irritable on medication.

## 2024-04-15 NOTE — Telephone Encounter (Signed)
 Can offer her medrol  dose pack as well and make her f/u to any PA. Let me know if she wants dose pack.

## 2024-04-15 NOTE — Telephone Encounter (Signed)
 Patient would like the Dosepak and a follow up has been made.

## 2024-04-15 NOTE — Addendum Note (Signed)
 Addended by: HILMA HASTINGS on: 04/15/2024 04:47 PM   Modules accepted: Orders

## 2024-04-15 NOTE — Telephone Encounter (Signed)
 Pt is having pains in her back, & pain to both legs. Left leg / foot is new pain. 9 out of 10 pain.  Settings have been changed on the [employer] device, and has not assisted the pain. Wondering if you need to see her or Dr.Y can?

## 2024-04-15 NOTE — Telephone Encounter (Signed)
 Please call her and find out more information about her pain. When did it start? Any new injury? Anything make it better or worse?   What is she taking for it?   Thanks.

## 2024-04-15 NOTE — Telephone Encounter (Signed)
 Patient notified and verbalized understanding.

## 2024-04-15 NOTE — Telephone Encounter (Signed)
 Patient states she went to a christian concert and did no dancing just standing and raising her hand up some. She did sit some, the concert was 3 hours long.  The next morning she was in a lot of pain. The pain starts from above the incision site and is going down both legs but the left leg is worse than the right this time. She states it's shooting sharp pain, no numbness or tingling, yesterday she could barely walk. Left leg is the worse. She has changed all 4 groups on the device and is not getting any relief. She has been laying on ice 3 times a day, heat 2 times a day She is taking Vicodin 1 every 6 hours and has not had to use this medication for awhile until now, Flexeril  1 during the day and 1 every night for a long time. I did ask her to contact [employer] to be certain there is no other settings she could try. She will call back with that answer.

## 2024-04-21 NOTE — Progress Notes (Unsigned)
 Referring Physician:  Bernardo Fend, DO [address] [place],  [place] [address]  Primary Physician:  Bernardo Fend, DO  History of Present Illness: Peggy Ruiz has a history of cervical CA, PTSD, GERD, lyme disease, migraines, pelvic pain, hypothyroidism, FM, and chronic back pain.   She is s/p placement of [employer] SCS on 10/23/23. She was doing very well at her last visit on 12/05/23. She was sent to PT.   She called on 04/15/24 with increased LBP and bilateral leg pain, left > right. Pain started after going to concert the a few days prior. She was taking vicodin and flexeril .   She was advised to call SCS rep and she was sent in a medrol  dose pack.   She continues with constant LBP with bilateral lateral leg pain to her foot, right leg pain > left leg pain, LBP > leg pain. She's had chronic right leg pain, the left leg pain is new. No alleviating factors.   She talked to Adam with [employer] and he changed her settings. This has not helped much.   She was only able to tolerate medrol  dose pack for 3 days- she has been dealing with tachycardia and steroids made it worse. She is seeing cardiology for this.   She has restarted her hydrocodone  (bid) and she is taking flexeril  (bid).   Bowel/Bladder Dysfunction: history of chronic urinary urgency (has seen urology), no bowel incontinence.   Conservative measures:  Physical therapy: doing PT currently at [facility] for her lower back  Multimodal medical therapy including regular antiinflammatories: flexeril , neurontin, subutex, nycenta Injections:  Had ESIs at [facility] in [place]  Past Surgery:  placement of Medtronic SCS on 10/23/23 Revision L5-S1, L4-L5 discectomy and TLIF L5-S1 on 11/24/16.  She's had multiple other lumbar surgeries (2 discectomies?)  The symptoms are causing a significant impact on the patient's life.   Review of Systems:  A 10 point review of systems is negative, except for  the pertinent positives and negatives detailed in the HPI.  Past Medical History: Past Medical History:  Diagnosis Date   Adenocarcinoma of cervix (HCC)    Anemia    Anorexia    at 15   Anxiety    Attention deficit disorder (ADD)    Bone spur    Bulging lumbar disc    Chronic pain syndrome    Chronically dry eyes, bilateral    Complication of anesthesia    delayed emergence   Depression    Eczema    Endometriosis    Gastroparesis    GERD (gastroesophageal reflux disease)    HPV (human papilloma virus) infection    Hypothyroidism    Lyme disease    Mild mood disorder    Nummular eczema    PONV (postoperative nausea and vomiting)    Pre-diabetes    PTSD (post-traumatic stress disorder)    Spinal stenosis of lumbar region    Thyroid  disease     Past Surgical History: Past Surgical History:  Procedure Laterality Date   ABDOMINAL HYSTERECTOMY     BACK SURGERY     3 different surgeries-lumbar   cervical cone ciopsy     CHOLECYSTECTOMY     DIAGNOSTIC LAPAROSCOPY     for endometriosis   EYE SURGERY     for lazy eye   HERNIA REPAIR     THORACIC LAMINECTOMY FOR SPINAL CORD STIMULATOR N/A 10/23/2023   Procedure: THORACIC LAMINECTOMY FOR SPINAL CORD STIMULATOR;  Surgeon: Clois Fret, MD;  Location: [facility] ORS;  Service: Neurosurgery;  Laterality: N/A;  THORACIC LAMINECTOMY FOR SPINAL CORD STIMULATOR    Allergies: Allergies as of 04/24/2024 - Review Complete 04/24/2024  Allergen Reaction Noted   Benzyl alcohol Hives and Other (See Comments) 06/25/2015   Benzoin Hives and Other (See Comments) 08/11/2022   Chlorhexidine  Rash 07/24/2023   Promethazine  02/11/2016   Scopolamine Rash 08/11/2022   Ciprofloxacin Other (See Comments) 02/07/2012   Cyanoacrylate Itching 10/12/2023   Dilaudid [hydromorphone]  04/07/2017   Epinephrine   02/25/2019   Penicillins Hives 02/07/2012   Toradol  [ketorolac  tromethamine ] Nausea And Vomiting 04/07/2017   Bactrim  [sulfamethoxazole-trimethoprim] Rash 05/09/2023    Medications: Outpatient Encounter Medications as of 04/24/2024  Medication Sig   brexpiprazole  (REXULTI ) 1 MG TABS tablet Take 2 mg by mouth every morning.   buPROPion  (WELLBUTRIN  XL) 150 MG 24 hr tablet Take 1 tablet (150 mg total) by mouth daily.   cholecalciferol (VITAMIN D3) 25 MCG (1000 UNIT) tablet Take 1,000 Units by mouth daily.   clonazePAM  (KLONOPIN ) 0.5 MG tablet Take 1 tablet (0.5 mg total) by mouth 2 (two) times daily AND 2 tablets (1 mg total) at bedtime.   cyclobenzaprine  (FLEXERIL ) 10 MG tablet Take 1 tablet (10 mg total) by mouth 3 (three) times daily as needed for muscle spasms.   docusate sodium (COLACE) 100 MG capsule Take 100-300 mg by mouth at bedtime as needed for mild constipation.   estradiol  (CLIMARA  - DOSED IN MG/24 HR) 0.075 mg/24hr patch 0.075 mg once a week.   gabapentin (NEURONTIN) 800 MG tablet Take 800 mg by mouth 2 (two) times daily.   HYDROcodone -acetaminophen  (NORCO/VICODIN) 5-325 MG tablet Take 1 tablet by mouth 3 (three) times daily as needed.   lansoprazole (PREVACID) 30 MG capsule Take 30 mg by mouth 2 (two) times daily before a meal.   levothyroxine  (SYNTHROID ) 50 MCG tablet Take 1 tablet (50 mcg total) by mouth daily.   loratadine (CLARITIN) 10 MG tablet Take 10 mg by mouth daily as needed for allergies.   Magnesium Oxide, Laxative, 500 MG TABS Take 1,500 mg by mouth at bedtime.   metoprolol  succinate (TOPROL  XL) 25 MG 24 hr tablet Take 1 tablet (25 mg total) by mouth daily.   Multiple Vitamin (MULTIVITAMIN) tablet Take 1 tablet by mouth daily.   nortriptyline  (PAMELOR ) 10 MG capsule Take 1 capsule (10 mg total) by mouth at bedtime.   ondansetron  (ZOFRAN -ODT) 8 MG disintegrating tablet Take 8 mg by mouth every 8 (eight) hours as needed for vomiting or nausea.   RESTASIS 0.05 % ophthalmic emulsion Place 1 drop into both eyes 2 (two) times daily.   ursodiol (ACTIGALL) 300 MG capsule Take 300 mg by mouth  2 (two) times daily.   [DISCONTINUED] methylPREDNISolone  (MEDROL  DOSEPAK) 4 MG TBPK tablet Use as directed x 6 days.   [DISCONTINUED] NUCYNTA 75 MG tablet Take 75 mg by mouth every 6 (six) hours as needed for moderate pain (pain score 4-6). (Patient not taking: Reported on 03/13/2024)   No facility-administered encounter medications on file as of 04/24/2024.    Social History: Social History   Tobacco Use   Smoking status: Never   Smokeless tobacco: Never  Vaping Use   Vaping status: Never Used  Substance Use Topics   Alcohol use: Yes    Comment: rare   Drug use: No    Family Medical History: Family History  Problem Relation Age of Onset   Heart attack Mother    Hypertension Mother  Fibromyalgia Mother    Anxiety disorder Mother    Depression Mother    Depression Father    Anxiety disorder Father     Physical Examination: Vitals:   04/24/24 0847  BP: 96/62      Awake, alert, oriented to person, place, and time.  Speech is clear and fluent. Fund of knowledge is appropriate.   Cranial Nerves: Pupils equal round and reactive to light.  Facial tone is symmetric.    She has well healed thoracic and lumbar incisions.    No abnormal lesions on exposed skin.   Strength:  Side Iliopsoas Quads Hamstring PF DF EHL  R 5 5 5 5 5 5   L 5 5 5 5 5 5    She can toe and heel stand on right and left foot.   Reflexes are 1+ and symmetric at the patella and achilles.    Clonus is not present.   Bilateral lower extremity sensation is intact to light touch.  She has normal gait.   Medical Decision Making  Imaging: none  Assessment and Plan: Peggy Ruiz is s/p placement of [employer]tronic SCS on 10/23/23. She was doing very well at her last visit on 12/05/23.   She called on 04/15/24 with increased LBP and bilateral leg pain, left > right. Pain started after going to concert the a few days prior.   She continues with constant LBP with bilateral lateral leg pain to her foot,  right leg pain > left leg pain, LBP > leg pain. She's had chronic right leg pain, the left leg pain is new.  No new imaging. Her previous lumbar xrays showed retrolisthesis at L4-5. She has 6.3 mm of retrolisthesis on extension that reduces to approximately 1.7 mm on flexion (per Yarbrough's last note).   Treatment options discussed with patient and following plan made:   - Xrays of thoracic and lumbar spine today. Will message her with results.  - MRI of lumbar spine to further evaluate increased back and bilateral leg pain. SCS should be MRI compatible. She will check with rep.  - Continue on norco 5 bid and flexeril  bid from outside pain management. She is trying to limit her narcotics.  - Will plan for in person or MyChart visit to review imaging results once I have them back.   I spent a total of  30 minutes in face-to-face and non-face-to-face activities related to this patient's care today including review of outside records, review of imaging, review of symptoms, physical exam, discussion of differential diagnosis, discussion of treatment options, and documentation.   Glade Boys PA-C Dept. of Neurosurgery

## 2024-04-23 DIAGNOSIS — M5416 Radiculopathy, lumbar region: Secondary | ICD-10-CM | POA: Diagnosis not present

## 2024-04-23 DIAGNOSIS — R5383 Other fatigue: Secondary | ICD-10-CM | POA: Diagnosis not present

## 2024-04-24 ENCOUNTER — Ambulatory Visit

## 2024-04-24 ENCOUNTER — Ambulatory Visit: Admitting: Orthopedic Surgery

## 2024-04-24 ENCOUNTER — Encounter: Payer: Self-pay | Admitting: Orthopedic Surgery

## 2024-04-24 VITALS — BP 96/62 | Ht 68.0 in | Wt 143.0 lb

## 2024-04-24 DIAGNOSIS — M5442 Lumbago with sciatica, left side: Secondary | ICD-10-CM

## 2024-04-24 DIAGNOSIS — M5441 Lumbago with sciatica, right side: Secondary | ICD-10-CM

## 2024-04-24 DIAGNOSIS — G894 Chronic pain syndrome: Secondary | ICD-10-CM | POA: Diagnosis not present

## 2024-04-24 DIAGNOSIS — Z9689 Presence of other specified functional implants: Secondary | ICD-10-CM

## 2024-04-24 DIAGNOSIS — Z981 Arthrodesis status: Secondary | ICD-10-CM

## 2024-04-24 DIAGNOSIS — M5416 Radiculopathy, lumbar region: Secondary | ICD-10-CM | POA: Diagnosis not present

## 2024-04-24 DIAGNOSIS — M4316 Spondylolisthesis, lumbar region: Secondary | ICD-10-CM

## 2024-04-25 ENCOUNTER — Ambulatory Visit: Admitting: Psychiatry

## 2024-04-25 DIAGNOSIS — F32A Depression, unspecified: Secondary | ICD-10-CM | POA: Diagnosis not present

## 2024-04-25 DIAGNOSIS — F4310 Post-traumatic stress disorder, unspecified: Secondary | ICD-10-CM | POA: Diagnosis not present

## 2024-04-25 DIAGNOSIS — F411 Generalized anxiety disorder: Secondary | ICD-10-CM | POA: Diagnosis not present

## 2024-04-25 NOTE — Addendum Note (Signed)
 Addended by: SHEPARD DAMIEN ORN on: 04/25/2024 04:14 PM   Modules accepted: Orders

## 2024-04-26 ENCOUNTER — Encounter: Payer: Self-pay | Admitting: Orthopedic Surgery

## 2024-04-26 DIAGNOSIS — M5416 Radiculopathy, lumbar region: Secondary | ICD-10-CM | POA: Diagnosis not present

## 2024-04-26 DIAGNOSIS — R5383 Other fatigue: Secondary | ICD-10-CM | POA: Diagnosis not present

## 2024-04-26 NOTE — Telephone Encounter (Signed)
 Thoracic xrays dated 04/24/24:  FINDINGS:   BONES: Ascending thoracic stimulator leads project over the posterior canal at the approximate T8-T9 level. Alignment is normal. Vertebral body heights are normal. No acute fracture. No aggressive appearing osseous lesion.   DISCS AND DEGENERATIVE CHANGES: No severe degenerative changes.   SOFT TISSUES: The visualized lungs are clear.   IMPRESSION: 1. Thoracic stimulator leads project over the posterior canal at the T8-T9 level.   Electronically signed by: Luke Bun MD 04/25/2024 09:10 PM EDT RP Workstation: [id number]   Lumbar xrays dated 04/24/24:  FINDINGS:   LUMBAR SPINE: BONES: No acute fracture. No aggressive appearing osseous lesion. Posterior spinal fusion hardware is present at L5-S1 with an interbody device. There is trace retrolisthesis of L4 on L5 and L3 on L4. This appears stable with flexion and extension.   DISCS AND DEGENERATIVE CHANGES: Mild degenerative changes are present at L2-L3. Posterior spinal fusion hardware is present at L5-S1 with an interbody device,.   SOFT TISSUES: A left-sided stimulator generator with ascending leads is present.   IMPRESSION: 1. Posterior spinal fusion hardware at L5-S1 with interbody device. 2. Trace retrolisthesis of L4 on L5 and L3 on L4, stable on flexion and extension views. 3. Mild degenerative changes at L2-L3.   Electronically signed by: Luke Bun MD 04/25/2024 09:08 PM EDT RP Workstation: [id number]  I have personally reviewed the images and agree with the above interpretation.

## 2024-04-29 ENCOUNTER — Other Ambulatory Visit: Payer: Self-pay

## 2024-04-29 ENCOUNTER — Telehealth: Payer: Self-pay | Admitting: Pharmacy Technician

## 2024-04-29 ENCOUNTER — Other Ambulatory Visit (HOSPITAL_COMMUNITY): Payer: Self-pay

## 2024-04-29 MED ORDER — IVABRADINE HCL 5 MG PO TABS: 5.0000 mg | ORAL_TABLET | Freq: Two times a day (BID) | ORAL | 3 refills | Status: DC

## 2024-04-29 NOTE — Telephone Encounter (Signed)
 Pharmacy Patient Advocate Encounter  Received notification from [facility] that Prior Authorization for Ivabradine has been APPROVED from 03/30/24 to 04/29/25. Ran test claim, Copay is $0.00- one month. This test claim was processed through [facility]- copay amounts may vary at other pharmacies due to pharmacy/plan contracts, or as the patient moves through the different stages of their insurance plan.   PA #/Case ID/Reference #: [id number]

## 2024-04-29 NOTE — Telephone Encounter (Signed)
 Please advise on what indication patient is using medication for.

## 2024-04-29 NOTE — Telephone Encounter (Signed)
 Looks like someone on our team was already working on this one. PA was approved, see separate encounter for details.

## 2024-04-29 NOTE — Telephone Encounter (Signed)
 Pharmacy Patient Advocate Encounter   Received notification from CoverMyMeds that prior authorization for ivabradine is required/requested.   Insurance verification completed.   The patient is insured through [employer].   Per test claim: PA required; PA submitted to above mentioned insurance via Latent Key/confirmation #/EOC [facility] Status is pending

## 2024-05-01 ENCOUNTER — Ambulatory Visit: Attending: Cardiology

## 2024-05-01 DIAGNOSIS — R0600 Dyspnea, unspecified: Secondary | ICD-10-CM

## 2024-05-01 DIAGNOSIS — R000 Tachycardia, unspecified: Secondary | ICD-10-CM | POA: Diagnosis not present

## 2024-05-01 LAB — ECHOCARDIOGRAM COMPLETE
AR max vel: 2.79 cm2
AV Area VTI: 2.72 cm2
AV Area mean vel: 2.75 cm2
AV Mean grad: 3 mmHg
AV Peak grad: 5.3 mmHg
Ao pk vel: 1.15 m/s
Area-P 1/2: 4.68 cm2
S' Lateral: 3.2 cm

## 2024-05-02 ENCOUNTER — Ambulatory Visit: Payer: Self-pay | Admitting: Cardiology

## 2024-05-02 ENCOUNTER — Ambulatory Visit: Admitting: Psychiatry

## 2024-05-02 DIAGNOSIS — R5383 Other fatigue: Secondary | ICD-10-CM | POA: Diagnosis not present

## 2024-05-02 DIAGNOSIS — F411 Generalized anxiety disorder: Secondary | ICD-10-CM

## 2024-05-02 DIAGNOSIS — M5416 Radiculopathy, lumbar region: Secondary | ICD-10-CM | POA: Diagnosis not present

## 2024-05-02 NOTE — Progress Notes (Signed)
 [facility] Counselor/Therapist Progress Note  Patient ID: Peggy Ruiz, MRN: [id number],    Date: 05/02/2024  Time Spent: 52 minutes start time 4:05 PM end time 4:57 PM  Treatment Type: Individual Therapy  Reported Symptoms: Pain, health issues, anxiety, triggered responses, sadness, intrusive thoughts  Mental Status Exam:  Appearance:   Well Groomed     Behavior:  Appropriate  Motor:  Normal  Speech/Language:   Normal Rate  Affect:  Appropriate tearful  Mood:  anxious and sad  Thought process:  normal  Thought content:    WNL  Sensory/Perceptual disturbances:    Pain  Orientation:  oriented to person, place, time/date, and situation  Attention:  Good  Concentration:  Good  Memory:  WNL  Fund of knowledge:   Good  Insight:    Good  Judgment:   Good  Impulse Control:  Good   Risk Assessment: Danger to Self:  No Self-injurious Behavior: No Danger to Others: No Duty to Warn:no Physical Aggression / Violence:No  Access to Firearms a concern: No  Gang Involvement:No   Subjective: Patient was present for session. She shared she went to a concert and it was a great concert but it was not good for her back and she ended up in a lot of pain. She had also worked to help her parents that week. She rested but did not get better but had more pain. She had to talk with her surgeon's office after several different tries.  She started a steroid pack but it escalated tachycardia. While she was in [place] she was in lots of pain. Her heart is still having issues and she started a new medication and she is on a estrogen patch now due to low levels.  Patient was given the opportunity just to discuss the things that are going on with her medically.  Through the discussion she was able to share that she is realizing she feels like a disappointment to her father and has for a long time.  She realizes she overdoes for them and that she had over done before going on the trip and she is not sure  that that did not cause issues with her back.  Encouraged patient to realize all that she does and to see how her father and mother are very blessed to be able to have her as a daughter and that she needs to focus on those things rather than feeling like she is not enough.  Discussed importance of working on her self-talk and trying to take things 1 step at a time as she goes through the medical issues that she is facing.  Interventions: Solution-Oriented/Positive Psychology and Insight-Oriented  Diagnosis:   ICD-10-CM   1. Generalized anxiety disorder  F41.1       Plan:  Patient is to use CBT , DBT, and coping skills to decrease triggered responses.   Patient is to follow plans from session to keep herself at a good place while she is going through the medical issues.  patient is to remind herself of all the progress she has made in treatment and to journal her insights.  Patient is to focus on what she can control fix and change.  Patient is to work on psychologist, sport and exercise on her mirror to repeat regularly.  Patient is to take things in small steps so that she is able to accomplish tasks.  Patient is to take medication as directed.  Patient is to work on Dr. Aleck  leaf on the neuro cycle detox.  Patient is to continue working with providers on medical issues   Silvano Pacini, LCMHC

## 2024-05-07 ENCOUNTER — Ambulatory Visit: Admission: RE | Admit: 2024-05-07 | Source: Ambulatory Visit

## 2024-05-07 ENCOUNTER — Ambulatory Visit
Admission: RE | Admit: 2024-05-07 | Discharge: 2024-05-07 | Disposition: A | Discharge status: Home / Self Care | Source: Ambulatory Visit | Attending: Orthopedic Surgery | Admitting: Orthopedic Surgery

## 2024-05-07 DIAGNOSIS — M5441 Lumbago with sciatica, right side: Secondary | ICD-10-CM | POA: Diagnosis not present

## 2024-05-07 DIAGNOSIS — Z981 Arthrodesis status: Secondary | ICD-10-CM | POA: Insufficient documentation

## 2024-05-07 DIAGNOSIS — Z9689 Presence of other specified functional implants: Secondary | ICD-10-CM | POA: Insufficient documentation

## 2024-05-07 DIAGNOSIS — M5416 Radiculopathy, lumbar region: Secondary | ICD-10-CM | POA: Insufficient documentation

## 2024-05-07 DIAGNOSIS — M5442 Lumbago with sciatica, left side: Secondary | ICD-10-CM | POA: Insufficient documentation

## 2024-05-07 DIAGNOSIS — G894 Chronic pain syndrome: Secondary | ICD-10-CM | POA: Insufficient documentation

## 2024-05-08 DIAGNOSIS — R5383 Other fatigue: Secondary | ICD-10-CM | POA: Diagnosis not present

## 2024-05-09 ENCOUNTER — Ambulatory Visit (INDEPENDENT_AMBULATORY_CARE_PROVIDER_SITE_OTHER): Admitting: Psychiatry

## 2024-05-09 DIAGNOSIS — F411 Generalized anxiety disorder: Secondary | ICD-10-CM | POA: Diagnosis not present

## 2024-05-09 NOTE — Progress Notes (Unsigned)
 [facility] Counselor/Therapist Progress Note  Patient ID: Peggy Ruiz, MRN: [id number],    Date: 05/09/2024  Time Spent: 55 minutes start time 1:04 PM end time 1:59 PM  Treatment Type: Individual Therapy  Reported Symptoms: Sadness, anxiety, panic, dissociative episode, intrusive thoughts, rumination, pain issues, health issues, meltdown, crying spells  Mental Status Exam:  Appearance:   Well Groomed     Behavior:  Appropriate  Motor:  Normal  Speech/Language:   Normal Rate  Affect:  Appropriate  Mood:  anxious  Thought process:  normal  Thought content:    WNL  Sensory/Perceptual disturbances:    WNL  Orientation:  oriented to person, place, time/date, and situation  Attention:  Good  Concentration:  Good  Memory:  WNL  Fund of knowledge:   Good  Insight:    Good  Judgment:   Good  Impulse Control:  Good   Risk Assessment: Danger to Self:  No Self-injurious Behavior: No Danger to Others: No Duty to Warn:no Physical Aggression / Violence:No  Access to Firearms a concern: No  Gang Involvement:No   Subjective: Patient was present for session. She shared she has had lots of discussions about test results and plans for the issues. She shared after lots of consideration she has decided that it may be best for her to try a PHP program.  Patient discussed how she is having more emotional issues and dysregulation recently and it led to a dissociative episode that was very concerning for her and her husband.  Patient was encouraged to feel good about her decision and to recognize that keeping her safe is the most important thing.  Patient shared that she felt guilty because of the working treatment and things were going very well and now she is considering PHP.  Reminded patient that she was doing very very well and we were moving to putting distance between sessions until recently when she started having the pain again in her back.  Discussed how that trauma and fear and  disappointment for her probably left her feeling very hopeless and overwhelmed and so it is not that the work and treatment has not been successful and that work is still going to be in place but with the recent issues her emotions and dysregulation make sense.  Patient shared that it did show from the MRI that things have changed in her back and her pain situation is more which will lead to different options including going back on pain medication potential surgery or another option if her physician can find 1.  Patient acknowledged the whole situation is overwhelming and scary for her and it has been very disheartening because she she was so hopeful that things were finally moving in a productive situation and she felt good about the fact that she is started thinking she might be able to work and have a normal life again and now she feels that that is not an option.  Patient was reminded of her coping skills and the importance of giving herself grace and recognizing the facts and truth of the situation and that none of this is her fault or anything she could control so it is okay for her to know she just has to take care of herself.  Patient is to contact clinician to let her know which option she decided on.  She is to call [facility] and [facility] to discuss their PHP and IOP programs.  Interventions: Solution-Oriented/Positive Psychology and Insight-Oriented  Diagnosis:   ICD-10-CM   1. Generalized anxiety disorder  F41.1       Plan:  Patient is to use CBT , DBT, and coping skills to decrease triggered responses.   Patient is to follow plans from session to attend a PHP program.  patient is to remind herself of all the progress she has made in treatment and to journal her insights.  Patient is to focus on what she can control fix and change.  Patient is to work on psychologist, sport and exercise on her mirror to repeat regularly.  Patient is to take things in small steps so that she is able to  accomplish tasks.  Patient is to take medication as directed.  Patient is to work on Dr. Aleck leaf on the neuro cycle detox.  Patient is to continue working with providers on medical issues   Silvano Pacini, LCMHC

## 2024-05-12 NOTE — Progress Notes (Unsigned)
 Referring Physician:  Bernardo Fend, DO [address] [place],  [place] [address]  Primary Physician:  Bernardo Fend, DO  History of Present Illness: [name] has a history of cervical CA, PTSD, GERD, lyme disease, migraines, pelvic pain, hypothyroidism, FM, and chronic back pain.   She is s/p placement of Medtronic SCS on 10/23/23.   Last seen by me on 04/24/24 when she was having increased LBP and bilateral lateral leg pain to her foot, right leg pain > left leg pain, LBP > leg pain. She's had chronic right leg pain, the left leg pain is new.   Updated lumbar xrays showed slip at L4-L5 that was unchanged. Thoracic xrays showed good placement of SCS.   She is here to review her lumbar MRI.   She continues with constant LBP with right lateral leg pain to her foot on right and left posterior leg pain to her foot. Right leg pain > left leg pain, LBP < leg pain. No alleviating factors. Her leg pain is burning in nature. She can't do much activity at all without making her pain leg pain severe.   Pain management has increased her to hydrocodone  to 10mg  but it is not helping.   Bowel/Bladder Dysfunction: history of chronic urinary urgency (has seen urology), no bowel incontinence.   Conservative measures:  Physical therapy: was in PT at [facility] for her lower back- has been discharged Multimodal medical therapy including regular antiinflammatories: flexeril , neurontin, subutex, nycenta Injections:  Had ESIs at [facility] in [place]  Past Surgery:  placement of Medtronic SCS on 10/23/23 Revision L5-S1, L4-L5 discectomy and TLIF L5-S1 on 11/24/16.  She's had multiple other lumbar surgeries (2 discectomies?)  The symptoms are causing a significant impact on the patient's life.   Review of Systems:  A 10 point review of systems is negative, except for the pertinent positives and negatives detailed in the HPI.  Past Medical History: Past Medical  History:  Diagnosis Date   Adenocarcinoma of cervix (HCC)    Anemia    Anorexia    at 15   Anxiety    Attention deficit disorder (ADD)    Bone spur    Bulging lumbar disc    Chronic pain syndrome    Chronically dry eyes, bilateral    Complication of anesthesia    delayed emergence   Depression    Eczema    Endometriosis    Gastroparesis    GERD (gastroesophageal reflux disease)    HPV (human papilloma virus) infection    Hypothyroidism    Lyme disease    Mild mood disorder    Nummular eczema    PONV (postoperative nausea and vomiting)    Pre-diabetes    PTSD (post-traumatic stress disorder)    Spinal stenosis of lumbar region    Thyroid  disease     Past Surgical History: Past Surgical History:  Procedure Laterality Date   ABDOMINAL HYSTERECTOMY     BACK SURGERY     3 different surgeries-lumbar   cervical cone ciopsy     CHOLECYSTECTOMY     DIAGNOSTIC LAPAROSCOPY     for endometriosis   EYE SURGERY     for lazy eye   HERNIA REPAIR     THORACIC LAMINECTOMY FOR SPINAL CORD STIMULATOR N/A 10/23/2023   Procedure: THORACIC LAMINECTOMY FOR SPINAL CORD STIMULATOR;  Surgeon: Clois Fret, MD;  Location: [facility] ORS;  Service: Neurosurgery;  Laterality: N/A;  THORACIC LAMINECTOMY FOR SPINAL CORD STIMULATOR  Allergies: Allergies as of 05/13/2024 - Review Complete 05/13/2024  Allergen Reaction Noted   Benzyl alcohol Hives and Other (See Comments) 06/25/2015   Benzoin Hives and Other (See Comments) 08/11/2022   Chlorhexidine  Rash 07/24/2023   Promethazine  02/11/2016   Scopolamine Rash 08/11/2022   Ciprofloxacin Other (See Comments) 02/07/2012   Cyanoacrylate Itching 10/12/2023   Dilaudid [hydromorphone]  04/07/2017   Epinephrine   02/25/2019   Penicillins Hives 02/07/2012   Toradol  [ketorolac  tromethamine ] Nausea And Vomiting 04/07/2017   Bactrim [sulfamethoxazole-trimethoprim] Rash 05/09/2023    Medications: Outpatient Encounter Medications as of 05/13/2024   Medication Sig   brexpiprazole  (REXULTI ) 1 MG TABS tablet Take 2 mg by mouth every morning.   buPROPion  (WELLBUTRIN  XL) 150 MG 24 hr tablet Take 1 tablet (150 mg total) by mouth daily. (Patient taking differently: Take 300 mg by mouth daily.)   cholecalciferol (VITAMIN D3) 25 MCG (1000 UNIT) tablet Take 1,000 Units by mouth daily.   clonazePAM  (KLONOPIN ) 0.5 MG tablet Take 1 tablet (0.5 mg total) by mouth 2 (two) times daily AND 2 tablets (1 mg total) at bedtime.   cyclobenzaprine  (FLEXERIL ) 10 MG tablet Take 1 tablet (10 mg total) by mouth 3 (three) times daily as needed for muscle spasms.   docusate sodium (COLACE) 100 MG capsule Take 100-300 mg by mouth at bedtime as needed for mild constipation.   estradiol  (ESTRACE ) 1 MG tablet Take 1 mg by mouth.   gabapentin (NEURONTIN) 800 MG tablet Take 800 mg by mouth 2 (two) times daily.   HYDROcodone -acetaminophen  (NORCO/VICODIN) 5-325 MG tablet Take 1 tablet by mouth 3 (three) times daily as needed. (Patient taking differently: Take 1 tablet by mouth 3 (three) times daily as needed. Taking 10-325mg )   ivabradine (CORLANOR) 5 MG TABS tablet Take 1 tablet (5 mg total) by mouth 2 (two) times daily with a meal.   lansoprazole (PREVACID) 30 MG capsule Take 30 mg by mouth 2 (two) times daily before a meal.   levothyroxine  (SYNTHROID ) 50 MCG tablet Take 1 tablet (50 mcg total) by mouth daily.   loratadine (CLARITIN) 10 MG tablet Take 10 mg by mouth daily as needed for allergies.   Magnesium Oxide, Laxative, 500 MG TABS Take 1,500 mg by mouth at bedtime.   Multiple Vitamin (MULTIVITAMIN) tablet Take 1 tablet by mouth daily.   nortriptyline  (PAMELOR ) 10 MG capsule Take 1 capsule (10 mg total) by mouth at bedtime.   ondansetron  (ZOFRAN -ODT) 8 MG disintegrating tablet Take 8 mg by mouth every 8 (eight) hours as needed for vomiting or nausea.   prochlorperazine (COMPAZINE) 5 MG tablet Take 5 mg by mouth every 6 (six) hours as needed for nausea or vomiting.    RESTASIS 0.05 % ophthalmic emulsion Place 1 drop into both eyes 2 (two) times daily.   ursodiol (ACTIGALL) 300 MG capsule Take 300 mg by mouth 2 (two) times daily.   [DISCONTINUED] estradiol  (CLIMARA  - DOSED IN MG/24 HR) 0.075 mg/24hr patch 0.075 mg once a week.   No facility-administered encounter medications on file as of 05/13/2024.    Social History: Social History   Tobacco Use   Smoking status: Never   Smokeless tobacco: Never  Vaping Use   Vaping status: Never Used  Substance Use Topics   Alcohol use: Yes    Comment: rare   Drug use: No    Family Medical History: Family History  Problem Relation Age of Onset   Heart attack Mother    Hypertension Mother    Fibromyalgia  Mother    Anxiety disorder Mother    Depression Mother    Depression Father    Anxiety disorder Father     Physical Examination: Vitals:   05/13/24 1303  BP: 110/78    Awake, alert, oriented to person, place, and time.  Speech is clear and fluent. Fund of knowledge is appropriate.   Cranial Nerves: Pupils equal round and reactive to light.  Facial tone is symmetric.    She has well healed thoracic and lumbar incisions.    No abnormal lesions on exposed skin.   Strength:  Side Iliopsoas Quads Hamstring PF DF EHL  R 5 5 5 5 5 5   L 5 5 5 5 5 5     Reflexes are 1+ and symmetric at the patella and achilles.    Clonus is not present.   Bilateral lower extremity sensation is intact to light touch.  She has normal gait.   Medical Decision Making  Imaging: Lumbar MRI dated 05/07/24:  FINDINGS: There is stable alignment of the lumbar spine. Fusion hardware is seen at L5-S1. There is no evidence of hardware failure. There is no vertebral body height loss, subluxation or marrow replacing process. The sacrum and SI joints are unremarkable so far as visualized. Conus and cauda equina are unremarkable.   T12-L1: There is no focal disc protrusion, foraminal or spinal stenosis.   L1-2: There  is no focal disc protrusion, foraminal or spinal stenosis.   L2-3: Minimal disc osteophyte without significant foraminal or spinal stenosis.   L3-4: Minimal disc osteophyte mild-to-moderate facet arthrosis. No significant foraminal or spinal stenosis.   L4-5: Mild broad-based disc osteophyte with foraminal narrowing, right greater than left. There is abutment of the exiting right L4 nerve. Mild-to-moderate facet arthrosis is present. There is relatively stable when compared with the prior examination. Correlation for right L4 radicular symptoms. No significant progression.   L5-S1: Fusion at L5-S1 with disc spacer probable right laminotomy. No significant foraminal or spinal stenosis is identified.   The retroperitoneal structures demonstrate no significant abnormality.   IMPRESSION: Stable degenerative disc desiccation and mild facet arthrosis. There is relatively stable mild-to-moderate right foraminal narrowing at L4-5. Correlation for mild right L4 radiculopathy. No significant spinal stenosis.   Stable fusion at the L5-S1 level.   No acute abnormality.   Electronically signed by: Norleen Satchel MD 05/07/2024 04:26 PM EST RP Workstation: [id number]  I have personally reviewed the images and agree with the above interpretation.  Above imaging reviewed with Dr. Clois prior her visit.   Assessment and Plan: Ms. Pae is s/p placement of Medtronic SCS on 10/23/23. She was doing very well at her last visit on 12/05/23.   She called on 04/15/24 with increased LBP and bilateral leg pain, left > right. Pain started after going to concert the a few days prior.   She has constant LBP with right lateral leg pain to her foot on right and left posterior leg pain to her foot. Right leg pain > left leg pain, LBP < leg pain. She can't do much activity at all without making her pain leg pain severe.   She has known retrolisthesis at L4-5 with fusion L5-S1. She has mild/moderate right  foraminal stenosis L4-L5.  Thoracic xrays showed SCS in good position.   Treatment options discussed with patient and following plan made:   - Met with Medtr[employer] reps today for reprogramming.  - EMG/NCS of bilateral lower extremites ordered to be done at [facility] neurology [place]) to evaluate  her leg pain, especially on left side.  - Will reviewed with Dr. Clois further and message her regarding other treatment options.  I spent a total of  30 minutes in face-to-face and non-face-to-face activities related to this patient's care today including review of outside records, review of imaging, review of symptoms, physical exam, discussion of differential diagnosis, discussion of treatment options, and documentation.   Glade Boys PA-C Dept. of Neurosurgery

## 2024-05-13 ENCOUNTER — Encounter: Payer: Self-pay | Admitting: Orthopedic Surgery

## 2024-05-13 ENCOUNTER — Ambulatory Visit: Admitting: Orthopedic Surgery

## 2024-05-13 ENCOUNTER — Telehealth: Payer: Self-pay | Admitting: Orthopedic Surgery

## 2024-05-13 VITALS — BP 110/78 | Wt 143.6 lb

## 2024-05-13 DIAGNOSIS — M48061 Spinal stenosis, lumbar region without neurogenic claudication: Secondary | ICD-10-CM

## 2024-05-13 DIAGNOSIS — Z9689 Presence of other specified functional implants: Secondary | ICD-10-CM

## 2024-05-13 DIAGNOSIS — M5416 Radiculopathy, lumbar region: Secondary | ICD-10-CM | POA: Diagnosis not present

## 2024-05-13 DIAGNOSIS — Z981 Arthrodesis status: Secondary | ICD-10-CM

## 2024-05-13 DIAGNOSIS — M4316 Spondylolisthesis, lumbar region: Secondary | ICD-10-CM | POA: Diagnosis not present

## 2024-05-13 DIAGNOSIS — G894 Chronic pain syndrome: Secondary | ICD-10-CM

## 2024-05-13 NOTE — Telephone Encounter (Signed)
 I put in order for EMG/NCS of bilateral lower extremities for Dr. Maree at [facility].

## 2024-05-14 NOTE — Telephone Encounter (Signed)
 EMG order faxed to [facility].

## 2024-05-15 DIAGNOSIS — F332 Major depressive disorder, recurrent severe without psychotic features: Secondary | ICD-10-CM | POA: Diagnosis not present

## 2024-05-16 DIAGNOSIS — F332 Major depressive disorder, recurrent severe without psychotic features: Secondary | ICD-10-CM | POA: Diagnosis not present

## 2024-05-17 DIAGNOSIS — F332 Major depressive disorder, recurrent severe without psychotic features: Secondary | ICD-10-CM | POA: Diagnosis not present

## 2024-05-20 ENCOUNTER — Ambulatory Visit: Admitting: Internal Medicine

## 2024-05-20 DIAGNOSIS — F332 Major depressive disorder, recurrent severe without psychotic features: Secondary | ICD-10-CM | POA: Diagnosis not present

## 2024-05-21 DIAGNOSIS — F332 Major depressive disorder, recurrent severe without psychotic features: Secondary | ICD-10-CM | POA: Diagnosis not present

## 2024-05-22 DIAGNOSIS — F332 Major depressive disorder, recurrent severe without psychotic features: Secondary | ICD-10-CM | POA: Diagnosis not present

## 2024-05-24 DIAGNOSIS — F332 Major depressive disorder, recurrent severe without psychotic features: Secondary | ICD-10-CM | POA: Diagnosis not present

## 2024-05-27 DIAGNOSIS — F332 Major depressive disorder, recurrent severe without psychotic features: Secondary | ICD-10-CM | POA: Diagnosis not present

## 2024-05-28 DIAGNOSIS — F332 Major depressive disorder, recurrent severe without psychotic features: Secondary | ICD-10-CM | POA: Diagnosis not present

## 2024-05-29 DIAGNOSIS — F332 Major depressive disorder, recurrent severe without psychotic features: Secondary | ICD-10-CM | POA: Diagnosis not present

## 2024-05-29 DIAGNOSIS — R112 Nausea with vomiting, unspecified: Secondary | ICD-10-CM | POA: Diagnosis not present

## 2024-05-29 DIAGNOSIS — K30 Functional dyspepsia: Secondary | ICD-10-CM | POA: Diagnosis not present

## 2024-05-29 DIAGNOSIS — K5904 Chronic idiopathic constipation: Secondary | ICD-10-CM | POA: Diagnosis not present

## 2024-05-29 DIAGNOSIS — K219 Gastro-esophageal reflux disease without esophagitis: Secondary | ICD-10-CM | POA: Diagnosis not present

## 2024-05-30 ENCOUNTER — Ambulatory Visit: Admitting: Psychiatry

## 2024-05-30 DIAGNOSIS — F332 Major depressive disorder, recurrent severe without psychotic features: Secondary | ICD-10-CM | POA: Diagnosis not present

## 2024-05-31 DIAGNOSIS — F332 Major depressive disorder, recurrent severe without psychotic features: Secondary | ICD-10-CM | POA: Diagnosis not present

## 2024-06-03 DIAGNOSIS — F332 Major depressive disorder, recurrent severe without psychotic features: Secondary | ICD-10-CM | POA: Diagnosis not present

## 2024-06-03 DIAGNOSIS — R059 Cough, unspecified: Secondary | ICD-10-CM | POA: Diagnosis not present

## 2024-06-03 DIAGNOSIS — R070 Pain in throat: Secondary | ICD-10-CM | POA: Diagnosis not present

## 2024-06-03 DIAGNOSIS — J069 Acute upper respiratory infection, unspecified: Secondary | ICD-10-CM | POA: Diagnosis not present

## 2024-06-04 ENCOUNTER — Ambulatory Visit: Admitting: Psychiatry

## 2024-06-06 DIAGNOSIS — F332 Major depressive disorder, recurrent severe without psychotic features: Secondary | ICD-10-CM | POA: Diagnosis not present

## 2024-06-07 DIAGNOSIS — F332 Major depressive disorder, recurrent severe without psychotic features: Secondary | ICD-10-CM | POA: Diagnosis not present

## 2024-06-10 DIAGNOSIS — F332 Major depressive disorder, recurrent severe without psychotic features: Secondary | ICD-10-CM | POA: Diagnosis not present

## 2024-06-11 DIAGNOSIS — F332 Major depressive disorder, recurrent severe without psychotic features: Secondary | ICD-10-CM | POA: Diagnosis not present

## 2024-06-12 ENCOUNTER — Ambulatory Visit: Admitting: Cardiology

## 2024-06-12 DIAGNOSIS — F332 Major depressive disorder, recurrent severe without psychotic features: Secondary | ICD-10-CM | POA: Diagnosis not present

## 2024-06-13 ENCOUNTER — Ambulatory Visit: Admitting: Psychiatry

## 2024-06-13 DIAGNOSIS — F332 Major depressive disorder, recurrent severe without psychotic features: Secondary | ICD-10-CM | POA: Diagnosis not present

## 2024-06-14 DIAGNOSIS — F332 Major depressive disorder, recurrent severe without psychotic features: Secondary | ICD-10-CM | POA: Diagnosis not present

## 2024-06-17 DIAGNOSIS — F332 Major depressive disorder, recurrent severe without psychotic features: Secondary | ICD-10-CM | POA: Diagnosis not present

## 2024-06-18 DIAGNOSIS — Z5181 Encounter for therapeutic drug level monitoring: Secondary | ICD-10-CM | POA: Diagnosis not present

## 2024-06-18 DIAGNOSIS — M961 Postlaminectomy syndrome, not elsewhere classified: Secondary | ICD-10-CM | POA: Diagnosis not present

## 2024-06-18 DIAGNOSIS — Z79899 Other long term (current) drug therapy: Secondary | ICD-10-CM | POA: Diagnosis not present

## 2024-06-18 DIAGNOSIS — F332 Major depressive disorder, recurrent severe without psychotic features: Secondary | ICD-10-CM | POA: Diagnosis not present

## 2024-06-18 DIAGNOSIS — M5416 Radiculopathy, lumbar region: Secondary | ICD-10-CM | POA: Diagnosis not present

## 2024-06-19 DIAGNOSIS — F332 Major depressive disorder, recurrent severe without psychotic features: Secondary | ICD-10-CM | POA: Diagnosis not present

## 2024-06-21 DIAGNOSIS — F332 Major depressive disorder, recurrent severe without psychotic features: Secondary | ICD-10-CM | POA: Diagnosis not present

## 2024-06-24 ENCOUNTER — Ambulatory Visit: Admitting: Psychiatry

## 2024-06-25 DIAGNOSIS — F332 Major depressive disorder, recurrent severe without psychotic features: Secondary | ICD-10-CM | POA: Diagnosis not present

## 2024-06-26 DIAGNOSIS — F332 Major depressive disorder, recurrent severe without psychotic features: Secondary | ICD-10-CM | POA: Diagnosis not present

## 2024-07-04 ENCOUNTER — Ambulatory Visit: Admitting: Psychiatry

## 2024-07-11 ENCOUNTER — Ambulatory Visit: Admitting: Psychiatry

## 2024-07-12 ENCOUNTER — Ambulatory Visit: Attending: Cardiology | Admitting: Cardiology

## 2024-07-12 VITALS — BP 108/78 | HR 89 | Ht 68.0 in | Wt 150.2 lb

## 2024-07-12 DIAGNOSIS — R0600 Dyspnea, unspecified: Secondary | ICD-10-CM | POA: Diagnosis not present

## 2024-07-12 DIAGNOSIS — R000 Tachycardia, unspecified: Secondary | ICD-10-CM | POA: Diagnosis not present

## 2024-07-12 MED ORDER — IVABRADINE HCL 5 MG PO TABS: 2.5000 mg | ORAL_TABLET | Freq: Two times a day (BID) | ORAL | 3 refills | Status: AC

## 2024-07-12 NOTE — Progress Notes (Signed)
 " Cardiology Office Note:    Date:  07/12/2024   ID:  [name], DOB July 25, 1985, MRN [id number]  PCP:  Bernardo Fend, DO   [facility] Providers Cardiologist:  None     Referring MD: Bernardo Fend, DO   Chief Complaint  Patient presents with   Follow-up    4 month follow up visit. Patient is doing well on today. Meds reviewed. Patient states that she is extremely thirst with increased urination every since being on Ivabradine .      History of Present Illness:    Peggy Ruiz is a 39 y.o. female with a hx of hypothyroidism, depression, lumbar spinal stenosis presenting for follow-up.    Previously seen due to elevated heart rates and shortness of breath.  Initially started on Toprol -XL but stopped due to fatigue and low blood pressures.  Started on ivabradine  with good effect, heart rates appear adequately controlled but patient complains of increased thirst.  Otherwise doing okay.  Echocardiogram showed normal systolic function.  Prior notes/testing Echo 11/25 EF 55 to 60% Cardiac monitor 10/25 no significant arrhythmias, average heart rate 87  Past Medical History:  Diagnosis Date   Adenocarcinoma of cervix (HCC)    Anemia    Anorexia    at 15   Anxiety    Attention deficit disorder (ADD)    Bone spur    Bulging lumbar disc    Chronic pain syndrome    Chronically dry eyes, bilateral    Complication of anesthesia    delayed emergence   Depression    Eczema    Endometriosis    Gastroparesis    GERD (gastroesophageal reflux disease)    HPV (human papilloma virus) infection    Hypothyroidism    Lyme disease    Mild mood disorder    Nummular eczema    PONV (postoperative nausea and vomiting)    Pre-diabetes    PTSD (post-traumatic stress disorder)    Spinal stenosis of lumbar region    Thyroid  disease     Past Surgical History:  Procedure Laterality Date   ABDOMINAL HYSTERECTOMY     BACK SURGERY     3 different surgeries-lumbar    cervical cone ciopsy     CHOLECYSTECTOMY     DIAGNOSTIC LAPAROSCOPY     for endometriosis   EYE SURGERY     for lazy eye   HERNIA REPAIR     THORACIC LAMINECTOMY FOR SPINAL CORD STIMULATOR N/A 10/23/2023   Procedure: THORACIC LAMINECTOMY FOR SPINAL CORD STIMULATOR;  Surgeon: Clois Fret, MD;  Location: [facility] ORS;  Service: Neurosurgery;  Laterality: N/A;  THORACIC LAMINECTOMY FOR SPINAL CORD STIMULATOR    Current Medications: Current Meds  Medication Sig   brexpiprazole  (REXULTI ) 1 MG TABS tablet Take 2 mg by mouth every morning.   buPROPion  (WELLBUTRIN  XL) 150 MG 24 hr tablet Take 1 tablet (150 mg total) by mouth daily.   cholecalciferol (VITAMIN D3) 25 MCG (1000 UNIT) tablet Take 1,000 Units by mouth daily.   clonazePAM  (KLONOPIN ) 0.5 MG tablet Take 1 tablet (0.5 mg total) by mouth 2 (two) times daily AND 2 tablets (1 mg total) at bedtime.   cyclobenzaprine  (FLEXERIL ) 10 MG tablet Take 1 tablet (10 mg total) by mouth 3 (three) times daily as needed for muscle spasms.   docusate sodium (COLACE) 100 MG capsule Take 100-300 mg by mouth at bedtime as needed for mild constipation.   escitalopram (LEXAPRO) 10 MG tablet Take 10 mg by mouth daily.  estradiol  (ESTRACE ) 1 MG tablet Take 1 mg by mouth. (Patient taking differently: Take 0.25 mg by mouth daily.)   gabapentin (NEURONTIN) 800 MG tablet Take 800 mg by mouth 2 (two) times daily.   HYDROcodone -acetaminophen  (NORCO/VICODIN) 5-325 MG tablet Take 1 tablet by mouth 3 (three) times daily as needed. (Patient taking differently: Take 1 tablet by mouth 3 (three) times daily as needed. Taking 10-325mg )   lansoprazole (PREVACID) 30 MG capsule Take 30 mg by mouth 2 (two) times daily before a meal.   levothyroxine  (SYNTHROID ) 50 MCG tablet Take 1 tablet (50 mcg total) by mouth daily.   loratadine (CLARITIN) 10 MG tablet Take 10 mg by mouth daily as needed for allergies.   Magnesium Oxide, Laxative, 500 MG TABS Take 1,500 mg by mouth at bedtime.    Multiple Vitamin (MULTIVITAMIN) tablet Take 1 tablet by mouth daily.   nortriptyline  (PAMELOR ) 10 MG capsule Take 1 capsule (10 mg total) by mouth at bedtime.   ondansetron  (ZOFRAN -ODT) 8 MG disintegrating tablet Take 8 mg by mouth every 8 (eight) hours as needed for vomiting or nausea.   RESTASIS 0.05 % ophthalmic emulsion Place 1 drop into both eyes 2 (two) times daily.   ursodiol (ACTIGALL) 300 MG capsule Take 300 mg by mouth 2 (two) times daily.   [DISCONTINUED] ivabradine  (CORLANOR) 5 MG TABS tablet Take 1 tablet (5 mg total) by mouth 2 (two) times daily with a meal.     Allergies:   Benzyl alcohol, Benzoin, Chlorhexidine , Promethazine, Scopolamine, Ciprofloxacin, Cyanoacrylate, Dilaudid [hydromorphone], Epinephrine , Penicillins, Toradol  [ketorolac  tromethamine ], and Bactrim [sulfamethoxazole-trimethoprim]   Social History   Socioeconomic History   Marital status: Married    Spouse name: Not on file   Number of children: Not on file   Years of education: Not on file   Highest education level: Bachelor's degree (e.g., BA, AB, BS)  Occupational History   Not on file  Tobacco Use   Smoking status: Never   Smokeless tobacco: Never  Vaping Use   Vaping status: Never Used  Substance and Sexual Activity   Alcohol use: Yes    Comment: rare   Drug use: No   Sexual activity: Never    Birth control/protection: Surgical  Other Topics Concern   Not on file  Social History Narrative   Regular exercise: noneCaffeine use: daily. Recently moved from [place], [place]   Social Drivers of Health   Tobacco Use: Low Risk (07/04/2024)   Received from [facility]   Patient History    Smoking Tobacco Use: Never    Smokeless Tobacco Use: Never    Passive Exposure: Never  Financial Resource Strain: Low Risk (03/05/2024)   Received from [facility]   Overall Financial Resource Strain (CARDIA)    How hard is it for you to pay for the very basics like food, housing, medical care, and  heating?: Not hard at all  Food Insecurity: No Food Insecurity (03/05/2024)   Received from [facility]   Epic    Within the past 12 months, you worried that your food would run out before you got the money to buy more.: Never true    Within the past 12 months, the food you bought just didn't last and you didn't have money to get more.: Never true  Transportation Needs: No Transportation Needs (03/05/2024)   Received from [facility]    In the past 12 months, has lack of transportation kept you from medical appointments or from getting medications?: No  In the past 12 months, has lack of transportation kept you from meetings, work, or from getting things needed for daily living?: No  Physical Activity: Insufficiently Active (03/05/2024)   Received from [facility]   Exercise Vital Sign    On average, how many days per week do you engage in moderate to strenuous exercise (like a brisk walk)?: 2 days    On average, how many minutes do you engage in exercise at this level?: 10 min  Stress: No Stress Concern Present (03/05/2024)   Received from [facility] - Occupational Stress Questionnaire    Do you feel stress - tense, restless, nervous, or anxious, or unable to sleep at night because your mind is troubled all the time - these days?: Only a little  Social Connections: Somewhat Isolated (03/05/2024)   Received from [facility]   Social Network    How would you rate your social network (family, work, friends)?: Restricted participation with some degree of social isolation  Depression (PHQ2-9): Low Risk (02/16/2024)   Depression (PHQ2-9)    PHQ-2 Score: 0  Alcohol Screen: Low Risk (02/15/2024)   Alcohol Screen    Last Alcohol Screening Score (AUDIT): 1  Housing: Low Risk (03/05/2024)   Received from [facility]    In the last 12 months, was there a time when you were not able to pay the mortgage or rent on time?: No    In the past  12 months, how many times have you moved where you were living?: 0    At any time in the past 12 months, were you homeless or living in a shelter (including now)?: No  Utilities: Not At Risk (03/05/2024)   Received from [facility]    In the past 12 months has the electric, gas, oil, or water company threatened to shut off services in your home?: No  Health Literacy: Not on file     Family History: The patient's family history includes Anxiety disorder in her father and mother; Depression in her father and mother; Fibromyalgia in her mother; Heart attack in her mother; Hypertension in her mother.  ROS:   Please see the history of present illness.     All other systems reviewed and are negative.  EKGs/Labs/Other Studies Reviewed:    The following studies were reviewed today:       Recent Labs: 08/24/2023: ALT 16; BUN 15; Creat 0.85; Hemoglobin 12.4; Platelets 241; Potassium 4.3; Sodium 138 02/16/2024: TSH 0.96  Recent Lipid Panel    Component Value Date/Time   CHOL 141 08/24/2023 1151   TRIG 66 08/24/2023 1151   HDL 55 08/24/2023 1151   CHOLHDL 2.6 08/24/2023 1151   LDLCALC 71 08/24/2023 1151     Risk Assessment/Calculations:             Physical Exam:    VS:  BP 108/78 (BP Location: Left Arm, Patient Position: Sitting, Cuff Size: Normal)   Pulse 89   Ht 5' 8 (1.727 m)   Wt 150 lb 3.2 oz (68.1 kg)   LMP 03/02/2017   SpO2 99%   BMI 22.84 kg/m     Wt Readings from Last 3 Encounters:  07/12/24 150 lb 3.2 oz (68.1 kg)  05/13/24 143 lb 9.6 oz (65.1 kg)  04/24/24 143 lb (64.9 kg)     GEN:  Well nourished, well developed in no acute distress HEENT: Normal NECK: No JVD; No carotid  bruits CARDIAC: RRR, no murmurs, rubs, gallops RESPIRATORY:  Clear to auscultation without rales, wheezing or rhonchi  ABDOMEN: Soft, non-tender, non-distended MUSCULOSKELETAL:  No edema; No deformity  SKIN: Warm and dry NEUROLOGIC:  Alert and oriented x 3 PSYCHIATRIC:  Normal  affect   ASSESSMENT:    1. Tachycardia   2. Dyspnea, unspecified type    PLAN:    In order of problems listed above:  Tachycardia, prior EKG showing sinus tachycardia heart rate 102.  Cardiac monitor showed no significant abnormalities.  Average heart rate 87 while on AV nodal agent.  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  Due to increased tenderness, reduce Corlanor to 2.5 mg twice daily.  Monitor heart rate, monitor symptoms. Dyspnea, not consistent with angina.  Symptoms overall improved.  Echo with normal systolic function, no significant structural abnormalities.  Follow-up in 3 months.      Medication Adjustments/Labs and Tests Ordered: Current medicines are reviewed at length with the patient today.  Concerns regarding medicines are outlined above.  No orders of the defined types were placed in this encounter.  Meds ordered this encounter  Medications   ivabradine  (CORLANOR) 5 MG TABS tablet    Sig: Take 0.5 tablets (2.5 mg total) by mouth 2 (two) times daily with a meal.    Dispense:  90 tablet    Refill:  3    Patient Instructions  Medication Instructions:  - DECREASE ivabridine to 0.5 tablets twice daily   *If you need a refill on your cardiac medications before your next appointment, please call your pharmacy*  Lab Work: No labs ordered today  If you have labs (blood work) drawn today and your tests are completely normal, you will receive your results only by: MyChart Message (if you have MyChart) OR A paper copy in the mail If you have any lab test that is abnormal or we need to change your treatment, we will call you to review the results.  Testing/Procedures: No test ordered today   Follow-Up: At [facility], you and your health needs are our priority.  As part of our continuing mission to provide you with exceptional heart care, our providers are all part of one team.  This team includes your primary Cardiologist (physician) and  Advanced Practice Providers or APPs (Physician Assistants and Nurse Practitioners) who all work together to provide you with the care you need, when you need it.  Your next appointment:   6 week(s)  Provider:   Redell Cave, MD    We recommend signing up for the patient portal called MyChart.  Sign up information is provided on this After Visit Summary.  MyChart is used to connect with patients for Virtual Visits (Telemedicine).  Patients are able to view lab/test results, encounter notes, upcoming appointments, etc.  Non-urgent messages can be sent to your provider as well.   To learn more about what you can do with MyChart, go to [email or web address].               Signed, Redell Cave, MD  07/12/2024 5:27 PM    [facility] "

## 2024-07-12 NOTE — Patient Instructions (Signed)
 Medication Instructions:  - DECREASE ivabridine to 0.5 tablets twice daily   *If you need a refill on your cardiac medications before your next appointment, please call your pharmacy*  Lab Work: No labs ordered today  If you have labs (blood work) drawn today and your tests are completely normal, you will receive your results only by: MyChart Message (if you have MyChart) OR A paper copy in the mail If you have any lab test that is abnormal or we need to change your treatment, we will call you to review the results.  Testing/Procedures: No test ordered today   Follow-Up: At [facility], you and your health needs are our priority.  As part of our continuing mission to provide you with exceptional heart care, our providers are all part of one team.  This team includes your primary Cardiologist (physician) and Advanced Practice Providers or APPs (Physician Assistants and Nurse Practitioners) who all work together to provide you with the care you need, when you need it.  Your next appointment:   6 week(s)  Provider:   Redell Cave, MD    We recommend signing up for the patient portal called MyChart.  Sign up information is provided on this After Visit Summary.  MyChart is used to connect with patients for Virtual Visits (Telemedicine).  Patients are able to view lab/test results, encounter notes, upcoming appointments, etc.  Non-urgent messages can be sent to your provider as well.   To learn more about what you can do with MyChart, go to [email or web address].

## 2024-07-18 ENCOUNTER — Ambulatory Visit: Admitting: Psychiatry

## 2024-07-18 DIAGNOSIS — F411 Generalized anxiety disorder: Secondary | ICD-10-CM | POA: Diagnosis not present

## 2024-07-18 NOTE — Progress Notes (Signed)
"   °      [facility] Counselor/Therapist Progress Note  Patient ID: Peggy Ruiz, MRN: [id number],    Date: 07/18/2024  Time Spent: 52 minutes start time 12:59 PM end time 1:51 PM  Treatment Type: Individual Therapy  Reported Symptoms: Anxiety, panic, sadness, triggered responses, health issues, pain  Mental Status Exam:  Appearance:   Well Groomed     Behavior:  Appropriate  Motor:  Normal  Speech/Language:   Normal Rate  Affect:  Appropriate  Mood:  normal  Thought process:  normal  Thought content:    WNL  Sensory/Perceptual disturbances:    WNL  Orientation:  oriented to person, place, time/date, and situation  Attention:  Good  Concentration:  Good  Memory:  WNL  Fund of knowledge:   Good  Insight:    Good  Judgment:   Good  Impulse Control:  Good   Risk Assessment: Danger to Self:  No Self-injurious Behavior: No Danger to Others: No Duty to Warn:no Physical Aggression / Violence:No  Access to Firearms a concern: No  Gang Involvement:No   Subjective: patient was present for session. She shared that she did well overall. She shared she was in the PHP and IOP and in total she was there for 9 weeks. She is continuing to have back issues and is back on pain meds and was put on Lexapro.  Through the group she was able to connect with someone else who has chronic pain and illnesses.  Patient was able to share that the DBT skills that she has learned have been very helpful for her.  Discussed how to continue utilizing them to help her manage emotions appropriately.  Patient stated through group she worked on setting limits with her parents but is still unable to do so.  Discussed the importance of recognizing how to do that in an appropriate manner.  Patient updated treatment plan and goals in session.  Discussed the possibility of doing more trauma work if needed.  Patient was encouraged to feel good about all the progress she is made and encouraged to continue following through  with skills from her time in treatment.  Interventions: Solution-Oriented/Positive Psychology  Diagnosis:   ICD-10-CM   1. Generalized anxiety disorder  F41.1       Plan:  Patient is to use CBT , DBT, and coping skills to decrease triggered responses.   Patient is to follow plans from session to continue working on skills from [facility].  patient is to remind herself of all the progress she has made in treatment and to journal her insights.  Patient is to focus on what she can control fix and change.  Patient is to work on psychologist, sport and exercise on her mirror to repeat regularly.  Patient is to take things in small steps so that she is able to accomplish tasks.  Patient is to take medication as directed.  Patient is to work on Dr. Aleck leaf on the neuro cycle detox.  Patient is to continue working with providers on medical issues     Silvano Pacini, [facility]                   "

## 2024-07-25 ENCOUNTER — Ambulatory Visit: Admitting: Psychiatry

## 2024-07-25 DIAGNOSIS — F411 Generalized anxiety disorder: Secondary | ICD-10-CM

## 2024-07-25 NOTE — Progress Notes (Signed)
 "       [facility] Counselor/Therapist Progress Note  Patient ID: Peggy Ruiz, MRN: [id number],    Date: 07/25/2024  Time Spent: 16 minutes start time 4:09 PM end time 5:09 PM  Treatment Type: Individual Therapy  Reported Symptoms: anxiety, restlessness, panic, triggered responses, sadness, crying spells, rumination  Mental Status Exam:  Appearance:   Well Groomed     Behavior:  Appropriate  Motor:  Normal  Speech/Language:   Normal Rate  Affect:  Appropriate  Mood:  anxious  Thought process:  normal  Thought content:    WNL  Sensory/Perceptual disturbances:    WNL  Orientation:  oriented to person, place, time/date, and situation  Attention:  Good  Concentration:  Good  Memory:  WNL  Fund of knowledge:   Good  Insight:    Good  Judgment:   Good  Impulse Control:  Good   Risk Assessment: Danger to Self:  No Self-injurious Behavior: No Danger to Others: No Duty to Warn:no Physical Aggression / Violence:No  Access to Firearms a concern: No  Gang Involvement:No   Subjective: Patient was present for session. She shared she has noticed that she is getting restless and anxious in the early evenings. She shared how there have been issues with her husband and his work. She went on to share that her mother has been very emotional lately so that is another concern. She went on to share that nights are hard for her in general. She also had her parents at there house for several days due to the weather. Her dad is having memory issues and has left his phone, wallet and not taking his meds like he is supposed to take.  Patient reported she feels extremely overwhelmed and she just got out of the hospital and was feeling good but now that she is having to deal with everybody stuff she feels so much is on her.  Patient stated that there is a situation with her friend where she is feeling lots of pressure from her as well.  Patient did acknowledge she is using her coping skills and they are  helpful.  Encouraged her to continue using coping skills regularly.  Also encouraged patient to recognize that things do not have to be perfect and she cannot manage everything which is a good thing.  Patient was reminded that she has to have perspective and recognizing what she can do something about and what she can do and that that is okay to say that she cannot do something.  Ways to help her husband organize things and be more proactive about keeping up with his stuff coming into patterns of self-care were addressed with patient.  Patient has shared that her husband called late at night stating he forgot his wallet and was almost out of gas.  She went on to share that sent her into a huge panic.  Discussed how his safety is so important and he really triggered her fear more than anything.  Ways of communicating that with him and again helping them to be more proactive rather than reactive were discussed with patient.  Also encouraged patient to think of ways that she might be able to be more proactive with her parents like setting up a schedule so she does all doctors appointments at 1 time and letting them know if they want her to go to a doctor's appointment it will have to be during these hours.  All current during these time.  Is in  the day so she has a plan that she can feel positive.  Patient reported feeling good about plans from session and agreed she wants to continue working on learning ways to set limits with others and not take on other peoples issues.  Interventions: Cognitive Behavioral Therapy, Solution-Oriented/Positive Psychology, and Insight-Oriented  Diagnosis:   ICD-10-CM   1. Generalized anxiety disorder  F41.1       Plan:  Patient is to use CBT , DBT, and coping skills to decrease triggered responses.   Patient is to follow plans from session to continue working on skills from [facility].  patient is to remind herself of all the progress she has made in treatment and to  journal her insights.  Patient is to focus on what she can control fix and change.  Patient is to work on psychologist, sport and exercise on her mirror to repeat regularly.  Patient is to take things in small steps so that she is able to accomplish tasks.  Patient is to take medication as directed.  Patient is to work on Dr. Aleck leaf on the neuro cycle detox.  Patient is to continue working with providers on medical issues       Silvano Pacini, [facility]                   "

## 2024-08-01 ENCOUNTER — Ambulatory Visit: Admitting: Psychiatry

## 2024-08-01 DIAGNOSIS — F411 Generalized anxiety disorder: Secondary | ICD-10-CM

## 2024-08-01 NOTE — Progress Notes (Unsigned)
"   °      [facility] Counselor/Therapist Progress Note  Patient ID: Peggy Ruiz, MRN: [id number],    Date: 08/01/2024  Time Spent: *** start time 2:06 PM  Treatment Type: Individual Therapy  Reported Symptoms: anxiety, rumination, pain issues, health issues, triggered responses  Mental Status Exam:  Appearance:   Well Groomed     Behavior:  Appropriate  Motor:  Normal  Speech/Language:   Normal Rate  Affect:  Appropriate  Mood:  anxious  Thought process:  normal  Thought content:    WNL  Sensory/Perceptual disturbances:    WNL  Orientation:  oriented to person, place, time/date, and situation  Attention:  Good  Concentration:  Good  Memory:  WNL  Fund of knowledge:   Good  Insight:    Good  Judgment:   Good  Impulse Control:  Good   Risk Assessment: Danger to Self:  No Self-injurious Behavior: No Danger to Others: No Duty to Warn:no Physical Aggression / Violence:No  Access to Firearms a concern: No  Gang Involvement:No   Subjective: Patient was present for session. She shared that she was running a little late due to her husband calling and not being at a good place. Discussed how that was a good thing for him. She shared she has been trying to do her homework. She and her husband are working on a [employer] book. She went on to share she did work on trying to develop a loose schedule as discussed in last session and through it she was able to see that she can get rigid in her thinking and she needed to work on less perfectionism. She was also able to see that things go well until late afternoon early evening depending on when her husband comes home from work. That is the time that her mind goes in a negative direction.   Interventions: {PSY:[id number]}  Diagnosis:   ICD-10-CM   1. Generalized anxiety disorder  F41.1       Plan: ***  Silvano Pacini, [facility]                   "

## 2024-08-08 ENCOUNTER — Ambulatory Visit: Admitting: Family Medicine

## 2024-08-08 ENCOUNTER — Ambulatory Visit: Admitting: Psychiatry

## 2024-08-15 ENCOUNTER — Ambulatory Visit: Admitting: Psychiatry

## 2024-08-16 ENCOUNTER — Encounter: Admitting: Internal Medicine

## 2024-08-20 ENCOUNTER — Encounter: Admitting: Orthopedic Surgery

## 2024-08-22 ENCOUNTER — Ambulatory Visit: Admitting: Psychiatry

## 2024-08-23 ENCOUNTER — Ambulatory Visit: Admitting: Cardiology

## 2024-08-29 ENCOUNTER — Ambulatory Visit: Admitting: Psychiatry

## 2024-09-10 ENCOUNTER — Ambulatory Visit: Admitting: Psychiatry

## 2024-09-19 ENCOUNTER — Ambulatory Visit: Admitting: Psychiatry

## 2024-09-26 ENCOUNTER — Ambulatory Visit: Admitting: Psychiatry

## 2024-10-03 ENCOUNTER — Ambulatory Visit: Admitting: Psychiatry
